# Patient Record
Sex: Male | Born: 1935 | Race: White | Hispanic: No | Smoking: Former smoker
Health system: Southern US, Community
[De-identification: ages and names within clinical notes are randomized; demographics above are authoritative.]

## PROBLEM LIST (undated history)

## (undated) DIAGNOSIS — R0602 Shortness of breath: Secondary | ICD-10-CM

## (undated) DIAGNOSIS — I351 Nonrheumatic aortic (valve) insufficiency: Secondary | ICD-10-CM

## (undated) DIAGNOSIS — R7401 Elevation of levels of liver transaminase levels: Secondary | ICD-10-CM

## (undated) DIAGNOSIS — M4850XA Collapsed vertebra, not elsewhere classified, site unspecified, initial encounter for fracture: Secondary | ICD-10-CM

## (undated) DIAGNOSIS — M7989 Other specified soft tissue disorders: Secondary | ICD-10-CM

## (undated) DIAGNOSIS — I519 Heart disease, unspecified: Secondary | ICD-10-CM

## (undated) DIAGNOSIS — K4090 Unilateral inguinal hernia, without obstruction or gangrene, not specified as recurrent: Secondary | ICD-10-CM

## (undated) DIAGNOSIS — K2990 Gastroduodenitis, unspecified, without bleeding: Secondary | ICD-10-CM

## (undated) DIAGNOSIS — N5089 Other specified disorders of the male genital organs: Secondary | ICD-10-CM

## (undated) DIAGNOSIS — R1013 Epigastric pain: Secondary | ICD-10-CM

## (undated) DIAGNOSIS — R001 Bradycardia, unspecified: Secondary | ICD-10-CM

## (undated) DIAGNOSIS — M609 Myositis, unspecified: Secondary | ICD-10-CM

## (undated) DIAGNOSIS — L0390 Cellulitis, unspecified: Secondary | ICD-10-CM

## (undated) DIAGNOSIS — D589 Hereditary hemolytic anemia, unspecified: Secondary | ICD-10-CM

## (undated) DIAGNOSIS — R509 Fever, unspecified: Secondary | ICD-10-CM

## (undated) DIAGNOSIS — N200 Calculus of kidney: Secondary | ICD-10-CM

## (undated) DIAGNOSIS — I5189 Other ill-defined heart diseases: Secondary | ICD-10-CM

## (undated) DIAGNOSIS — R633 Feeding difficulties, unspecified: Secondary | ICD-10-CM

## (undated) DIAGNOSIS — K2970 Gastritis, unspecified, without bleeding: Secondary | ICD-10-CM

## (undated) DIAGNOSIS — R9430 Abnormal result of cardiovascular function study, unspecified: Secondary | ICD-10-CM

## (undated) DIAGNOSIS — G4700 Insomnia, unspecified: Secondary | ICD-10-CM

## (undated) DIAGNOSIS — R609 Edema, unspecified: Secondary | ICD-10-CM

## (undated) DIAGNOSIS — R7989 Other specified abnormal findings of blood chemistry: Secondary | ICD-10-CM

## (undated) DIAGNOSIS — C9110 Chronic lymphocytic leukemia of B-cell type not having achieved remission: Secondary | ICD-10-CM

## (undated) DIAGNOSIS — E785 Hyperlipidemia, unspecified: Secondary | ICD-10-CM

## (undated) DIAGNOSIS — K9422 Gastrostomy infection: Secondary | ICD-10-CM

## (undated) DIAGNOSIS — IMO0002 Reserved for concepts with insufficient information to code with codable children: Secondary | ICD-10-CM

## (undated) DIAGNOSIS — M3320 Polymyositis, organ involvement unspecified: Principal | ICD-10-CM

## (undated) DIAGNOSIS — R7402 Elevation of levels of lactic acid dehydrogenase (LDH): Secondary | ICD-10-CM

## (undated) DIAGNOSIS — R945 Abnormal results of liver function studies: Secondary | ICD-10-CM

## (undated) DIAGNOSIS — I4510 Unspecified right bundle-branch block: Secondary | ICD-10-CM

## (undated) DIAGNOSIS — I253 Aneurysm of heart: Secondary | ICD-10-CM

## (undated) DIAGNOSIS — R740 Nonspecific elevation of levels of transaminase and lactic acid dehydrogenase [LDH]: Secondary | ICD-10-CM

## (undated) DIAGNOSIS — I340 Nonrheumatic mitral (valve) insufficiency: Secondary | ICD-10-CM

## (undated) HISTORY — DX: Fever, unspecified: R50.9

## (undated) HISTORY — DX: Gastroduodenitis, unspecified, without bleeding: K29.90

## (undated) HISTORY — DX: Unspecified right bundle-branch block: I45.10

## (undated) HISTORY — PX: SPLENECTOMY: SUR1306

## (undated) HISTORY — DX: Abnormal results of liver function studies: R94.5

## (undated) HISTORY — PX: HERNIA REPAIR: SHX51

## (undated) HISTORY — DX: Collapsed vertebra, not elsewhere classified, site unspecified, initial encounter for fracture: M48.50XA

## (undated) HISTORY — PX: ODONTOID FRACTURE SURGERY: SHX724

## (undated) HISTORY — DX: Bradycardia, unspecified: R00.1

## (undated) HISTORY — PX: NASAL SINUS SURGERY: SHX719

## (undated) HISTORY — DX: Gastritis, unspecified, without bleeding: K29.70

## (undated) HISTORY — DX: Nonrheumatic aortic (valve) insufficiency: I35.1

## (undated) HISTORY — DX: Elevation of levels of liver transaminase levels: R74.01

## (undated) HISTORY — DX: Insomnia, unspecified: G47.00

## (undated) HISTORY — DX: Nonspecific elevation of levels of transaminase and lactic acid dehydrogenase (ldh): R74.0

## (undated) HISTORY — DX: Chronic lymphocytic leukemia of B-cell type not having achieved remission: C91.10

## (undated) HISTORY — DX: Abnormal result of cardiovascular function study, unspecified: R94.30

## (undated) HISTORY — DX: Reserved for concepts with insufficient information to code with codable children: IMO0002

## (undated) HISTORY — DX: Heart disease, unspecified: I51.9

## (undated) HISTORY — DX: Feeding difficulties: R63.3

## (undated) HISTORY — DX: Shortness of breath: R06.02

## (undated) HISTORY — DX: Other specified abnormal findings of blood chemistry: R79.89

## (undated) HISTORY — DX: Gastrostomy infection: K94.22

## (undated) HISTORY — DX: Other ill-defined heart diseases: I51.89

## (undated) HISTORY — DX: Epigastric pain: R10.13

## (undated) HISTORY — DX: Hyperlipidemia, unspecified: E78.5

## (undated) HISTORY — DX: Edema, unspecified: R60.9

## (undated) HISTORY — DX: Polymyositis, organ involvement unspecified: M33.20

## (undated) HISTORY — DX: Feeding difficulties, unspecified: R63.30

## (undated) HISTORY — DX: Hereditary hemolytic anemia, unspecified: D58.9

## (undated) HISTORY — PX: CATARACT EXTRACTION: SUR2

## (undated) HISTORY — DX: Calculus of kidney: N20.0

## (undated) HISTORY — PX: KYPHOSIS SURGERY: SHX114

## (undated) HISTORY — DX: Myositis, unspecified: M60.9

## (undated) HISTORY — DX: Cellulitis, unspecified: L03.90

## (undated) HISTORY — DX: Nonrheumatic mitral (valve) insufficiency: I34.0

## (undated) HISTORY — DX: Aneurysm of heart: I25.3

## (undated) HISTORY — DX: Elevation of levels of lactic acid dehydrogenase (LDH): R74.02

## (undated) HISTORY — DX: Other specified disorders of the male genital organs: N50.89

## (undated) HISTORY — DX: Other specified soft tissue disorders: M79.89

## (undated) HISTORY — DX: Unilateral inguinal hernia, without obstruction or gangrene, not specified as recurrent: K40.90

---

## 2004-01-10 ENCOUNTER — Ambulatory Visit: Payer: Self-pay | Admitting: Oncology

## 2004-02-09 ENCOUNTER — Ambulatory Visit: Payer: Self-pay | Admitting: Family Medicine

## 2004-02-11 ENCOUNTER — Ambulatory Visit: Payer: Self-pay | Admitting: Family Medicine

## 2004-02-18 ENCOUNTER — Ambulatory Visit: Payer: Self-pay | Admitting: Oncology

## 2004-02-20 ENCOUNTER — Ambulatory Visit: Payer: Self-pay | Admitting: Unknown Physician Specialty

## 2004-03-11 ENCOUNTER — Ambulatory Visit: Payer: Self-pay | Admitting: Oncology

## 2004-04-11 ENCOUNTER — Ambulatory Visit: Payer: Self-pay | Admitting: Oncology

## 2004-04-11 DIAGNOSIS — C9110 Chronic lymphocytic leukemia of B-cell type not having achieved remission: Secondary | ICD-10-CM

## 2004-04-11 HISTORY — DX: Chronic lymphocytic leukemia of B-cell type not having achieved remission: C91.10

## 2004-05-12 ENCOUNTER — Ambulatory Visit: Payer: Self-pay | Admitting: Oncology

## 2004-07-08 ENCOUNTER — Ambulatory Visit: Payer: Self-pay | Admitting: Oncology

## 2004-07-10 ENCOUNTER — Ambulatory Visit: Payer: Self-pay | Admitting: Oncology

## 2004-09-09 ENCOUNTER — Ambulatory Visit: Payer: Self-pay | Admitting: Oncology

## 2004-10-09 ENCOUNTER — Ambulatory Visit: Payer: Self-pay | Admitting: Oncology

## 2004-10-19 ENCOUNTER — Ambulatory Visit: Payer: Self-pay | Admitting: Unknown Physician Specialty

## 2004-10-26 ENCOUNTER — Ambulatory Visit: Payer: Self-pay | Admitting: Unknown Physician Specialty

## 2004-11-09 ENCOUNTER — Ambulatory Visit: Payer: Self-pay | Admitting: Oncology

## 2004-12-10 ENCOUNTER — Ambulatory Visit: Payer: Self-pay | Admitting: Oncology

## 2005-01-07 ENCOUNTER — Ambulatory Visit: Payer: Self-pay | Admitting: Family Medicine

## 2005-02-02 ENCOUNTER — Ambulatory Visit: Payer: Self-pay | Admitting: Family Medicine

## 2005-02-23 ENCOUNTER — Ambulatory Visit: Payer: Self-pay | Admitting: Oncology

## 2005-03-11 ENCOUNTER — Ambulatory Visit: Payer: Self-pay | Admitting: Oncology

## 2005-05-26 ENCOUNTER — Ambulatory Visit: Payer: Self-pay | Admitting: Oncology

## 2005-06-09 ENCOUNTER — Ambulatory Visit: Payer: Self-pay | Admitting: Oncology

## 2005-07-07 ENCOUNTER — Ambulatory Visit: Payer: Self-pay | Admitting: Unknown Physician Specialty

## 2005-07-10 ENCOUNTER — Ambulatory Visit: Payer: Self-pay | Admitting: Oncology

## 2005-07-26 ENCOUNTER — Ambulatory Visit: Payer: Self-pay | Admitting: Ophthalmology

## 2005-08-01 ENCOUNTER — Ambulatory Visit: Payer: Self-pay | Admitting: Unknown Physician Specialty

## 2005-08-09 ENCOUNTER — Ambulatory Visit: Payer: Self-pay | Admitting: Oncology

## 2005-08-18 ENCOUNTER — Ambulatory Visit: Payer: Self-pay | Admitting: Vascular Surgery

## 2005-08-30 ENCOUNTER — Ambulatory Visit: Payer: Self-pay | Admitting: Vascular Surgery

## 2005-09-06 ENCOUNTER — Ambulatory Visit: Payer: Self-pay | Admitting: Ophthalmology

## 2005-09-09 ENCOUNTER — Ambulatory Visit: Payer: Self-pay | Admitting: Oncology

## 2005-10-09 ENCOUNTER — Ambulatory Visit: Payer: Self-pay | Admitting: Oncology

## 2005-11-09 ENCOUNTER — Ambulatory Visit: Payer: Self-pay | Admitting: Oncology

## 2005-12-10 ENCOUNTER — Ambulatory Visit: Payer: Self-pay | Admitting: Oncology

## 2006-01-09 ENCOUNTER — Ambulatory Visit: Payer: Self-pay | Admitting: Oncology

## 2006-02-09 ENCOUNTER — Ambulatory Visit: Payer: Self-pay | Admitting: Oncology

## 2006-03-11 ENCOUNTER — Ambulatory Visit: Payer: Self-pay | Admitting: Oncology

## 2006-04-13 ENCOUNTER — Ambulatory Visit: Payer: Self-pay | Admitting: Oncology

## 2006-05-12 ENCOUNTER — Ambulatory Visit: Payer: Self-pay | Admitting: Oncology

## 2006-07-10 ENCOUNTER — Ambulatory Visit: Payer: Self-pay | Admitting: Oncology

## 2006-07-11 ENCOUNTER — Ambulatory Visit: Payer: Self-pay | Admitting: Oncology

## 2006-09-10 ENCOUNTER — Ambulatory Visit: Payer: Self-pay | Admitting: Oncology

## 2006-09-24 ENCOUNTER — Emergency Department: Payer: Self-pay

## 2006-09-24 ENCOUNTER — Other Ambulatory Visit: Payer: Self-pay

## 2006-10-09 ENCOUNTER — Ambulatory Visit: Payer: Self-pay | Admitting: Oncology

## 2006-10-10 ENCOUNTER — Ambulatory Visit: Payer: Self-pay | Admitting: Oncology

## 2006-12-11 ENCOUNTER — Ambulatory Visit: Payer: Self-pay | Admitting: Oncology

## 2007-01-08 ENCOUNTER — Ambulatory Visit: Payer: Self-pay | Admitting: Oncology

## 2007-01-10 ENCOUNTER — Ambulatory Visit: Payer: Self-pay | Admitting: Oncology

## 2007-04-02 ENCOUNTER — Ambulatory Visit: Payer: Self-pay | Admitting: Oncology

## 2007-04-12 ENCOUNTER — Ambulatory Visit: Payer: Self-pay | Admitting: Oncology

## 2007-04-12 DIAGNOSIS — I253 Aneurysm of heart: Secondary | ICD-10-CM

## 2007-04-12 DIAGNOSIS — I351 Nonrheumatic aortic (valve) insufficiency: Secondary | ICD-10-CM

## 2007-04-12 HISTORY — DX: Aneurysm of heart: I25.3

## 2007-04-12 HISTORY — DX: Nonrheumatic aortic (valve) insufficiency: I35.1

## 2007-04-20 ENCOUNTER — Other Ambulatory Visit: Payer: Self-pay

## 2007-04-20 ENCOUNTER — Ambulatory Visit: Payer: Self-pay | Admitting: Family Medicine

## 2007-05-04 ENCOUNTER — Ambulatory Visit: Payer: Self-pay | Admitting: Family Medicine

## 2007-05-10 ENCOUNTER — Inpatient Hospital Stay (HOSPITAL_COMMUNITY): Admission: EM | Admit: 2007-05-10 | Discharge: 2007-05-16 | Payer: Self-pay | Admitting: Emergency Medicine

## 2007-05-10 ENCOUNTER — Ambulatory Visit: Payer: Self-pay | Admitting: Pulmonary Disease

## 2007-05-10 ENCOUNTER — Ambulatory Visit: Payer: Self-pay | Admitting: Cardiovascular Disease

## 2007-05-10 ENCOUNTER — Emergency Department: Payer: Self-pay | Admitting: Emergency Medicine

## 2007-05-11 ENCOUNTER — Ambulatory Visit: Payer: Self-pay | Admitting: Cardiology

## 2007-05-11 ENCOUNTER — Encounter (INDEPENDENT_AMBULATORY_CARE_PROVIDER_SITE_OTHER): Payer: Self-pay | Admitting: Neurological Surgery

## 2007-05-22 ENCOUNTER — Ambulatory Visit: Payer: Self-pay | Admitting: Pulmonary Disease

## 2007-05-22 ENCOUNTER — Ambulatory Visit: Payer: Self-pay | Admitting: Cardiology

## 2007-05-22 DIAGNOSIS — R0602 Shortness of breath: Secondary | ICD-10-CM | POA: Insufficient documentation

## 2007-05-25 ENCOUNTER — Ambulatory Visit: Payer: Self-pay | Admitting: Internal Medicine

## 2007-05-26 ENCOUNTER — Ambulatory Visit (HOSPITAL_COMMUNITY): Admission: RE | Admit: 2007-05-26 | Discharge: 2007-05-26 | Payer: Self-pay | Admitting: Otolaryngology

## 2007-05-29 ENCOUNTER — Ambulatory Visit: Payer: Self-pay | Admitting: Cardiology

## 2007-06-01 ENCOUNTER — Telehealth: Payer: Self-pay | Admitting: Pulmonary Disease

## 2007-06-08 ENCOUNTER — Inpatient Hospital Stay (HOSPITAL_COMMUNITY): Admission: EM | Admit: 2007-06-08 | Discharge: 2007-06-24 | Payer: Self-pay | Admitting: Emergency Medicine

## 2007-06-08 ENCOUNTER — Ambulatory Visit: Payer: Self-pay | Admitting: Internal Medicine

## 2007-06-09 ENCOUNTER — Encounter: Payer: Self-pay | Admitting: Internal Medicine

## 2007-06-09 ENCOUNTER — Ambulatory Visit: Payer: Self-pay | Admitting: Surgery

## 2007-06-10 HISTORY — PX: PEG PLACEMENT: SHX5437

## 2007-06-15 ENCOUNTER — Encounter (INDEPENDENT_AMBULATORY_CARE_PROVIDER_SITE_OTHER): Payer: Self-pay | Admitting: General Surgery

## 2007-06-21 ENCOUNTER — Encounter: Payer: Self-pay | Admitting: Gastroenterology

## 2007-06-22 ENCOUNTER — Encounter: Payer: Self-pay | Admitting: Pulmonary Disease

## 2007-06-26 ENCOUNTER — Ambulatory Visit: Payer: Self-pay | Admitting: Gastroenterology

## 2007-07-02 ENCOUNTER — Ambulatory Visit: Payer: Self-pay | Admitting: Oncology

## 2007-07-04 ENCOUNTER — Ambulatory Visit: Payer: Self-pay | Admitting: Cardiology

## 2007-07-04 ENCOUNTER — Ambulatory Visit: Payer: Self-pay | Admitting: Gastroenterology

## 2007-07-04 LAB — CONVERTED CEMR LAB
ALT: 35 units/L (ref 0–53)
AST: 76 units/L — ABNORMAL HIGH (ref 0–37)
Alkaline Phosphatase: 72 units/L (ref 39–117)
Bilirubin, Direct: 0.3 mg/dL (ref 0.0–0.3)
CO2: 34 meq/L — ABNORMAL HIGH (ref 19–32)
Chloride: 94 meq/L — ABNORMAL LOW (ref 96–112)
Glucose, Bld: 112 mg/dL — ABNORMAL HIGH (ref 70–99)
HCT: 36.3 % — ABNORMAL LOW (ref 39.0–52.0)
MCV: 103 fL — ABNORMAL HIGH (ref 78.0–100.0)
Platelets: 269 10*3/uL (ref 150–400)
Potassium: 4.6 meq/L (ref 3.5–5.1)
RDW: 14.1 % (ref 11.5–14.6)
Sodium: 134 meq/L — ABNORMAL LOW (ref 135–145)
Total Protein: 6 g/dL (ref 6.0–8.3)

## 2007-07-11 ENCOUNTER — Ambulatory Visit: Payer: Self-pay | Admitting: Oncology

## 2007-07-19 ENCOUNTER — Ambulatory Visit: Payer: Self-pay | Admitting: Gastroenterology

## 2007-07-31 ENCOUNTER — Ambulatory Visit: Payer: Self-pay | Admitting: Cardiology

## 2007-08-08 ENCOUNTER — Ambulatory Visit: Payer: Self-pay | Admitting: Cardiology

## 2007-08-08 LAB — CONVERTED CEMR LAB
CO2: 30 meq/L (ref 19–32)
Calcium: 9.2 mg/dL (ref 8.4–10.5)
Creatinine, Ser: 0.6 mg/dL (ref 0.4–1.5)
Glucose, Bld: 160 mg/dL — ABNORMAL HIGH (ref 70–99)
Sodium: 134 meq/L — ABNORMAL LOW (ref 135–145)

## 2007-08-22 ENCOUNTER — Ambulatory Visit (HOSPITAL_COMMUNITY): Admission: RE | Admit: 2007-08-22 | Discharge: 2007-08-22 | Payer: Self-pay | Admitting: Neurology

## 2007-08-30 ENCOUNTER — Encounter: Admission: RE | Admit: 2007-08-30 | Discharge: 2007-09-13 | Payer: Self-pay | Admitting: Neurology

## 2007-09-10 ENCOUNTER — Ambulatory Visit: Payer: Self-pay | Admitting: Oncology

## 2007-10-05 ENCOUNTER — Ambulatory Visit: Payer: Self-pay | Admitting: Oncology

## 2007-10-09 ENCOUNTER — Ambulatory Visit: Payer: Self-pay | Admitting: Cardiology

## 2007-10-09 LAB — CONVERTED CEMR LAB
BUN: 23 mg/dL (ref 6–23)
GFR calc Af Amer: 141 mL/min
Glucose, Bld: 93 mg/dL (ref 70–99)
Potassium: 4.6 meq/L (ref 3.5–5.1)

## 2007-10-10 ENCOUNTER — Ambulatory Visit: Payer: Self-pay | Admitting: Oncology

## 2007-10-18 ENCOUNTER — Ambulatory Visit (HOSPITAL_COMMUNITY): Admission: RE | Admit: 2007-10-18 | Discharge: 2007-10-18 | Payer: Self-pay | Admitting: Neurology

## 2007-10-22 ENCOUNTER — Ambulatory Visit (HOSPITAL_COMMUNITY): Admission: RE | Admit: 2007-10-22 | Discharge: 2007-10-22 | Payer: Self-pay | Admitting: Chiropractic Medicine

## 2007-10-23 ENCOUNTER — Encounter: Payer: Self-pay | Admitting: Gastroenterology

## 2007-10-23 ENCOUNTER — Telehealth: Payer: Self-pay | Admitting: Gastroenterology

## 2007-10-25 ENCOUNTER — Encounter: Admission: RE | Admit: 2007-10-25 | Discharge: 2007-10-25 | Payer: Self-pay | Admitting: Neurological Surgery

## 2007-10-30 ENCOUNTER — Ambulatory Visit (HOSPITAL_COMMUNITY): Admission: RE | Admit: 2007-10-30 | Discharge: 2007-10-30 | Payer: Self-pay | Admitting: Neurological Surgery

## 2007-10-31 ENCOUNTER — Encounter (INDEPENDENT_AMBULATORY_CARE_PROVIDER_SITE_OTHER): Payer: Self-pay | Admitting: Neurological Surgery

## 2007-11-10 ENCOUNTER — Ambulatory Visit: Payer: Self-pay | Admitting: Oncology

## 2007-11-11 ENCOUNTER — Encounter: Admission: RE | Admit: 2007-11-11 | Discharge: 2007-11-11 | Payer: Self-pay | Admitting: Neurological Surgery

## 2007-11-12 ENCOUNTER — Encounter: Payer: Self-pay | Admitting: Gastroenterology

## 2007-11-16 ENCOUNTER — Inpatient Hospital Stay (HOSPITAL_COMMUNITY): Admission: RE | Admit: 2007-11-16 | Discharge: 2007-11-16 | Payer: Self-pay | Admitting: Neurosurgery

## 2007-11-17 ENCOUNTER — Emergency Department (HOSPITAL_COMMUNITY): Admission: EM | Admit: 2007-11-17 | Discharge: 2007-11-17 | Payer: Self-pay | Admitting: Emergency Medicine

## 2007-11-20 ENCOUNTER — Emergency Department (HOSPITAL_COMMUNITY): Admission: EM | Admit: 2007-11-20 | Discharge: 2007-11-20 | Payer: Self-pay | Admitting: Emergency Medicine

## 2007-12-04 ENCOUNTER — Encounter: Payer: Self-pay | Admitting: Gastroenterology

## 2007-12-07 ENCOUNTER — Encounter: Admission: RE | Admit: 2007-12-07 | Discharge: 2007-12-07 | Payer: Self-pay | Admitting: Neurological Surgery

## 2007-12-11 ENCOUNTER — Ambulatory Visit: Payer: Self-pay | Admitting: Oncology

## 2007-12-11 ENCOUNTER — Telehealth: Payer: Self-pay | Admitting: Gastroenterology

## 2007-12-11 HISTORY — PX: PEG TUBE REMOVAL: SHX2187

## 2007-12-12 ENCOUNTER — Encounter: Payer: Self-pay | Admitting: Gastroenterology

## 2007-12-13 ENCOUNTER — Observation Stay (HOSPITAL_COMMUNITY): Admission: RE | Admit: 2007-12-13 | Discharge: 2007-12-13 | Payer: Self-pay | Admitting: Neurological Surgery

## 2007-12-14 ENCOUNTER — Telehealth: Payer: Self-pay | Admitting: Gastroenterology

## 2007-12-24 ENCOUNTER — Ambulatory Visit: Payer: Self-pay | Admitting: Gastroenterology

## 2007-12-24 DIAGNOSIS — R633 Feeding difficulties: Secondary | ICD-10-CM

## 2007-12-24 DIAGNOSIS — R740 Nonspecific elevation of levels of transaminase and lactic acid dehydrogenase [LDH]: Secondary | ICD-10-CM

## 2008-01-03 ENCOUNTER — Ambulatory Visit (HOSPITAL_COMMUNITY): Admission: RE | Admit: 2008-01-03 | Discharge: 2008-01-04 | Payer: Self-pay | Admitting: Neurological Surgery

## 2008-01-03 ENCOUNTER — Encounter (INDEPENDENT_AMBULATORY_CARE_PROVIDER_SITE_OTHER): Payer: Self-pay | Admitting: Neurological Surgery

## 2008-01-10 ENCOUNTER — Ambulatory Visit: Payer: Self-pay | Admitting: Oncology

## 2008-01-13 ENCOUNTER — Encounter: Payer: Self-pay | Admitting: Cardiology

## 2008-01-14 ENCOUNTER — Ambulatory Visit: Payer: Self-pay | Admitting: Cardiology

## 2008-01-19 ENCOUNTER — Encounter: Admission: RE | Admit: 2008-01-19 | Discharge: 2008-01-19 | Payer: Self-pay | Admitting: Neurological Surgery

## 2008-01-28 ENCOUNTER — Ambulatory Visit (HOSPITAL_COMMUNITY): Admission: RE | Admit: 2008-01-28 | Discharge: 2008-01-29 | Payer: Self-pay | Admitting: Neurological Surgery

## 2008-01-28 ENCOUNTER — Encounter (INDEPENDENT_AMBULATORY_CARE_PROVIDER_SITE_OTHER): Payer: Self-pay | Admitting: Neurological Surgery

## 2008-01-31 ENCOUNTER — Encounter: Payer: Self-pay | Admitting: Cardiology

## 2008-01-31 ENCOUNTER — Ambulatory Visit: Payer: Self-pay | Admitting: Cardiology

## 2008-01-31 ENCOUNTER — Ambulatory Visit: Payer: Self-pay

## 2008-02-10 ENCOUNTER — Ambulatory Visit: Payer: Self-pay | Admitting: Oncology

## 2008-03-11 ENCOUNTER — Ambulatory Visit: Payer: Self-pay | Admitting: Oncology

## 2008-03-31 ENCOUNTER — Ambulatory Visit: Payer: Self-pay | Admitting: Family Medicine

## 2008-04-11 ENCOUNTER — Ambulatory Visit: Payer: Self-pay | Admitting: Oncology

## 2008-04-14 ENCOUNTER — Encounter: Payer: Self-pay | Admitting: Gastroenterology

## 2008-04-21 ENCOUNTER — Encounter: Admission: RE | Admit: 2008-04-21 | Discharge: 2008-04-21 | Payer: Self-pay | Admitting: Neurology

## 2008-04-24 ENCOUNTER — Ambulatory Visit: Payer: Self-pay | Admitting: Family Medicine

## 2008-05-19 ENCOUNTER — Telehealth: Payer: Self-pay | Admitting: Gastroenterology

## 2008-05-28 ENCOUNTER — Telehealth (INDEPENDENT_AMBULATORY_CARE_PROVIDER_SITE_OTHER): Payer: Self-pay

## 2008-05-29 ENCOUNTER — Ambulatory Visit: Payer: Self-pay | Admitting: Gastroenterology

## 2008-05-29 DIAGNOSIS — R1013 Epigastric pain: Secondary | ICD-10-CM | POA: Insufficient documentation

## 2008-05-29 LAB — CONVERTED CEMR LAB
H Pylori IgG: NEGATIVE
Lipase: 31 units/L (ref 11.0–59.0)

## 2008-06-03 ENCOUNTER — Ambulatory Visit: Payer: Self-pay | Admitting: Gastroenterology

## 2008-06-03 ENCOUNTER — Encounter: Payer: Self-pay | Admitting: Gastroenterology

## 2008-06-05 ENCOUNTER — Encounter: Payer: Self-pay | Admitting: Gastroenterology

## 2008-06-09 ENCOUNTER — Ambulatory Visit: Payer: Self-pay | Admitting: Oncology

## 2008-06-30 ENCOUNTER — Ambulatory Visit: Payer: Self-pay | Admitting: Gastroenterology

## 2008-06-30 DIAGNOSIS — K2990 Gastroduodenitis, unspecified, without bleeding: Secondary | ICD-10-CM

## 2008-06-30 DIAGNOSIS — K4090 Unilateral inguinal hernia, without obstruction or gangrene, not specified as recurrent: Secondary | ICD-10-CM | POA: Insufficient documentation

## 2008-06-30 DIAGNOSIS — K2970 Gastritis, unspecified, without bleeding: Secondary | ICD-10-CM | POA: Insufficient documentation

## 2008-06-30 LAB — CONVERTED CEMR LAB
BUN: 13 mg/dL (ref 6–23)
Creatinine, Ser: 0.7 mg/dL (ref 0.4–1.5)

## 2008-07-03 ENCOUNTER — Telehealth: Payer: Self-pay | Admitting: Gastroenterology

## 2008-07-04 ENCOUNTER — Ambulatory Visit: Payer: Self-pay | Admitting: Cardiology

## 2008-07-08 ENCOUNTER — Ambulatory Visit: Payer: Self-pay | Admitting: Oncology

## 2008-07-10 ENCOUNTER — Ambulatory Visit: Payer: Self-pay | Admitting: Oncology

## 2008-07-16 ENCOUNTER — Encounter: Admission: RE | Admit: 2008-07-16 | Discharge: 2008-07-16 | Payer: Self-pay | Admitting: Neurology

## 2008-07-18 ENCOUNTER — Ambulatory Visit (HOSPITAL_COMMUNITY): Admission: RE | Admit: 2008-07-18 | Discharge: 2008-07-19 | Payer: Self-pay | Admitting: Surgery

## 2008-07-22 DIAGNOSIS — N200 Calculus of kidney: Secondary | ICD-10-CM

## 2008-07-22 DIAGNOSIS — E785 Hyperlipidemia, unspecified: Secondary | ICD-10-CM

## 2008-07-22 DIAGNOSIS — M810 Age-related osteoporosis without current pathological fracture: Secondary | ICD-10-CM

## 2008-07-22 DIAGNOSIS — IMO0001 Reserved for inherently not codable concepts without codable children: Secondary | ICD-10-CM | POA: Insufficient documentation

## 2008-07-22 DIAGNOSIS — D649 Anemia, unspecified: Secondary | ICD-10-CM | POA: Insufficient documentation

## 2008-07-22 DIAGNOSIS — N5089 Other specified disorders of the male genital organs: Secondary | ICD-10-CM | POA: Insufficient documentation

## 2008-08-01 ENCOUNTER — Encounter: Payer: Self-pay | Admitting: Cardiology

## 2008-08-01 ENCOUNTER — Ambulatory Visit: Payer: Self-pay | Admitting: Cardiology

## 2008-08-01 DIAGNOSIS — C9110 Chronic lymphocytic leukemia of B-cell type not having achieved remission: Secondary | ICD-10-CM | POA: Insufficient documentation

## 2008-08-12 ENCOUNTER — Ambulatory Visit: Payer: Self-pay | Admitting: Oncology

## 2008-09-09 ENCOUNTER — Ambulatory Visit: Payer: Self-pay | Admitting: Oncology

## 2008-09-15 ENCOUNTER — Encounter: Payer: Self-pay | Admitting: Gastroenterology

## 2008-10-09 ENCOUNTER — Ambulatory Visit: Payer: Self-pay | Admitting: Oncology

## 2008-11-09 ENCOUNTER — Ambulatory Visit: Payer: Self-pay | Admitting: Oncology

## 2008-12-10 ENCOUNTER — Ambulatory Visit: Payer: Self-pay | Admitting: Oncology

## 2008-12-23 ENCOUNTER — Encounter: Payer: Self-pay | Admitting: Cardiology

## 2008-12-25 ENCOUNTER — Ambulatory Visit: Payer: Self-pay | Admitting: Unknown Physician Specialty

## 2009-01-09 ENCOUNTER — Ambulatory Visit: Payer: Self-pay | Admitting: Oncology

## 2009-01-15 ENCOUNTER — Encounter: Payer: Self-pay | Admitting: Cardiology

## 2009-01-18 ENCOUNTER — Encounter: Payer: Self-pay | Admitting: Cardiology

## 2009-01-19 ENCOUNTER — Ambulatory Visit: Payer: Self-pay | Admitting: Cardiology

## 2009-01-19 DIAGNOSIS — E876 Hypokalemia: Secondary | ICD-10-CM | POA: Insufficient documentation

## 2009-02-09 ENCOUNTER — Ambulatory Visit: Payer: Self-pay | Admitting: Oncology

## 2009-03-11 ENCOUNTER — Ambulatory Visit: Payer: Self-pay | Admitting: Oncology

## 2009-04-11 ENCOUNTER — Ambulatory Visit: Payer: Self-pay | Admitting: Oncology

## 2009-05-12 ENCOUNTER — Ambulatory Visit: Payer: Self-pay | Admitting: Oncology

## 2009-06-09 ENCOUNTER — Ambulatory Visit: Payer: Self-pay | Admitting: Oncology

## 2009-07-10 ENCOUNTER — Ambulatory Visit: Payer: Self-pay | Admitting: Oncology

## 2009-07-22 ENCOUNTER — Ambulatory Visit: Payer: Self-pay | Admitting: Cardiology

## 2009-08-09 ENCOUNTER — Ambulatory Visit: Payer: Self-pay | Admitting: Oncology

## 2009-09-09 ENCOUNTER — Ambulatory Visit: Payer: Self-pay | Admitting: Oncology

## 2009-10-20 ENCOUNTER — Ambulatory Visit: Payer: Self-pay | Admitting: Oncology

## 2009-11-09 ENCOUNTER — Ambulatory Visit: Payer: Self-pay | Admitting: Oncology

## 2009-11-22 IMAGING — CT CT CHEST W/ CM
1 of 2 series · 14 of 32 positions shown, 18 images · non-contrast
Comparison: none

REASON FOR EXAM: Dyspnea Fatigue x 2 wks Eval for PE Call Report [id number]
COMMENTS:

[Series 5: lung windows · axial · 0.77mm/px · z∈[-238,+46]mm · 14 of 113 slices shown, 18 images]
[im 9/113  mediastinal]
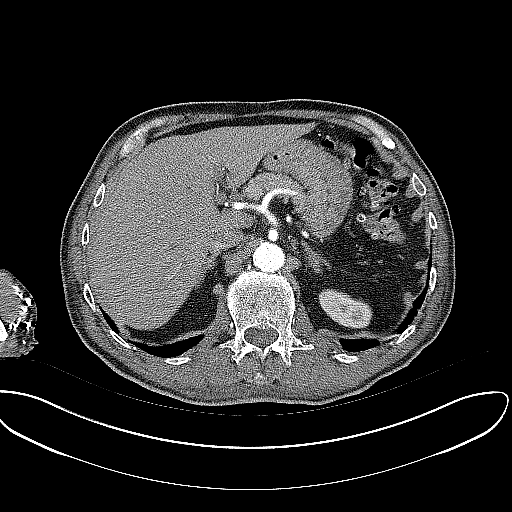
[im 9/113  lung]
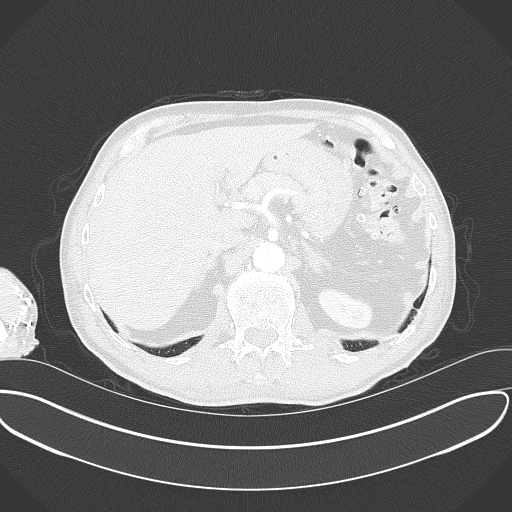
[im 18/113  lung]
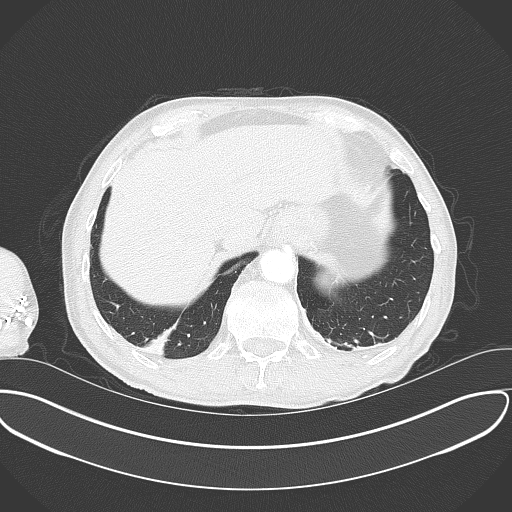
[im 26/113  lung]
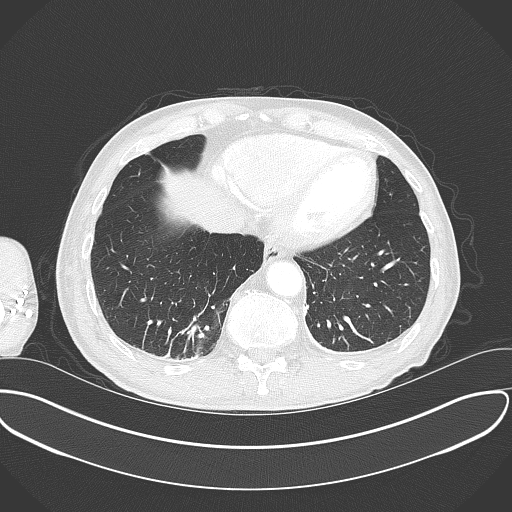
[im 35/113  lung]
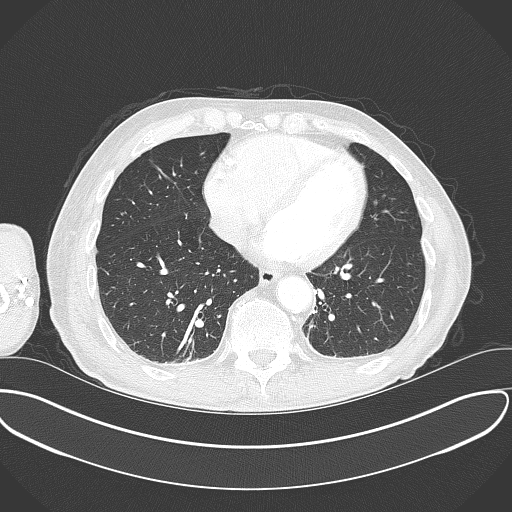
[im 44/113  mediastinal]
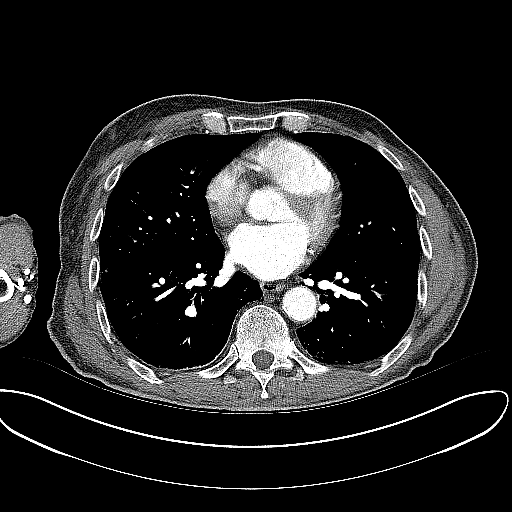
[im 44/113  lung]
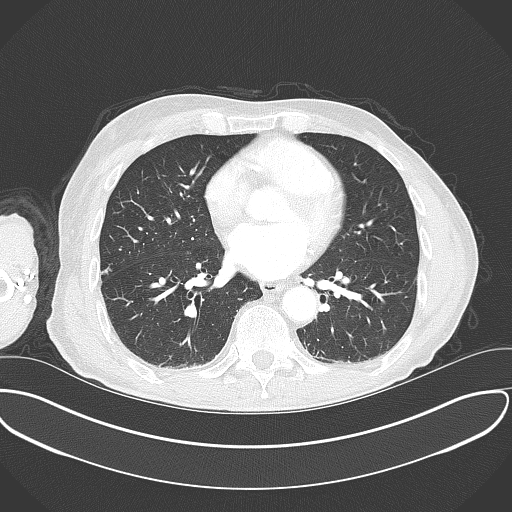
[im 52/113  lung]
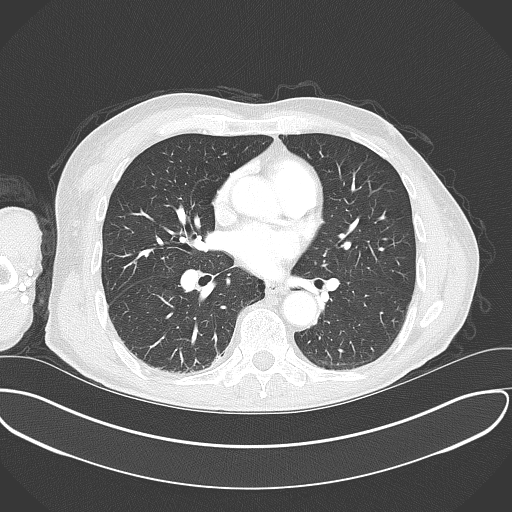
[im 53/113  lung]
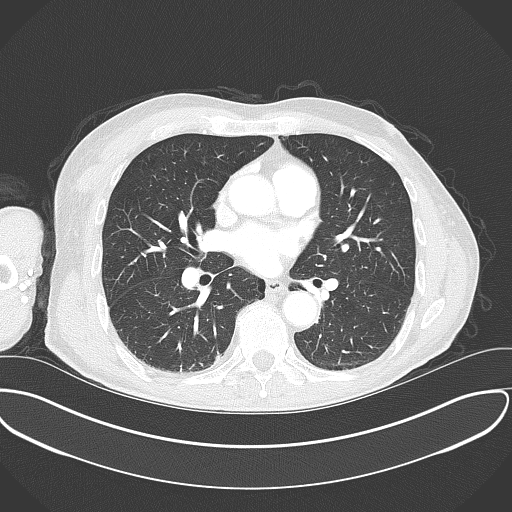
[im 57/113  lung]
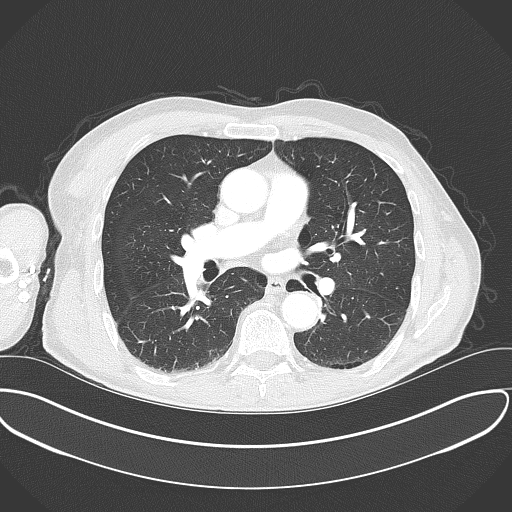
[im 61/113  mediastinal]
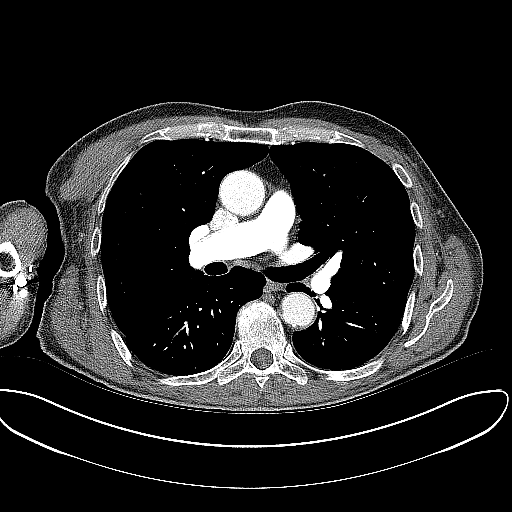
[im 61/113  lung]
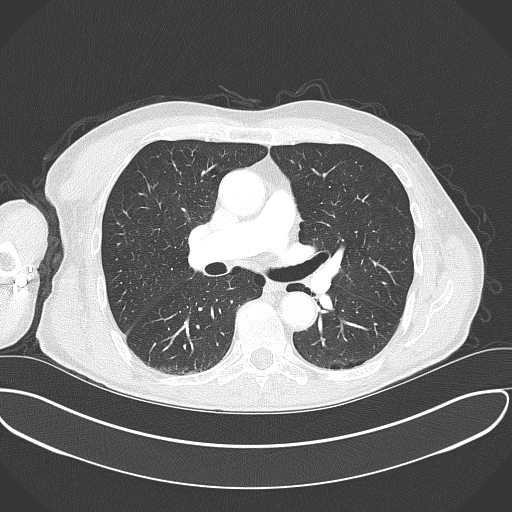
[im 69/113  lung]
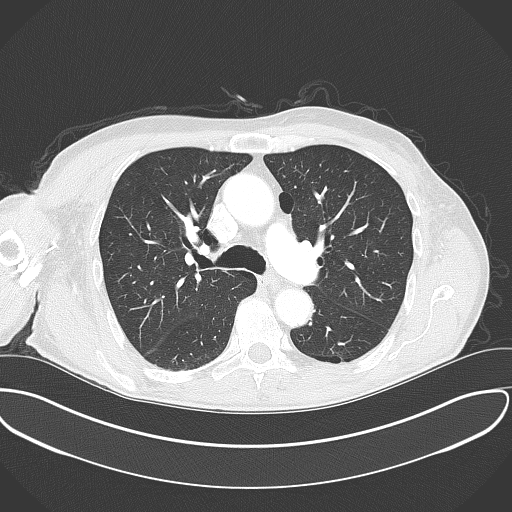
[im 78/113  lung]
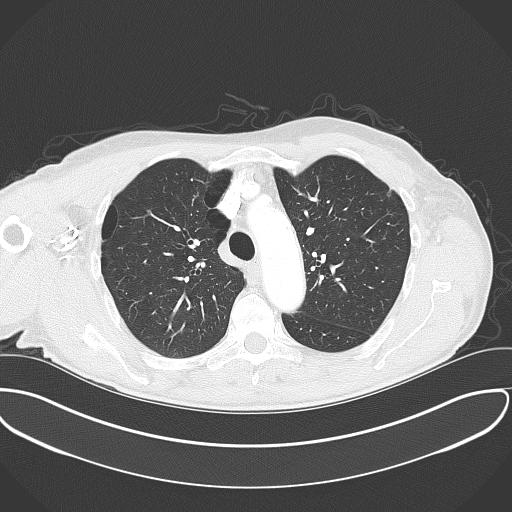
[im 87/113  lung]
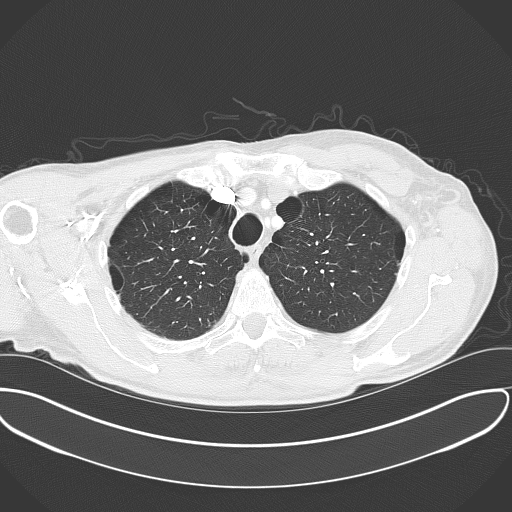
[im 95/113  mediastinal]
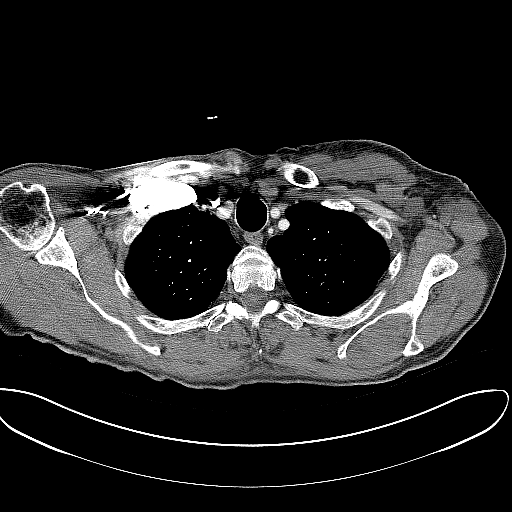
[im 95/113  lung]
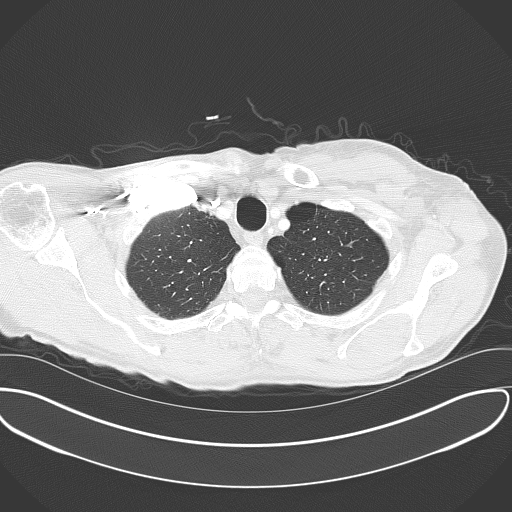
[im 104/113  lung]
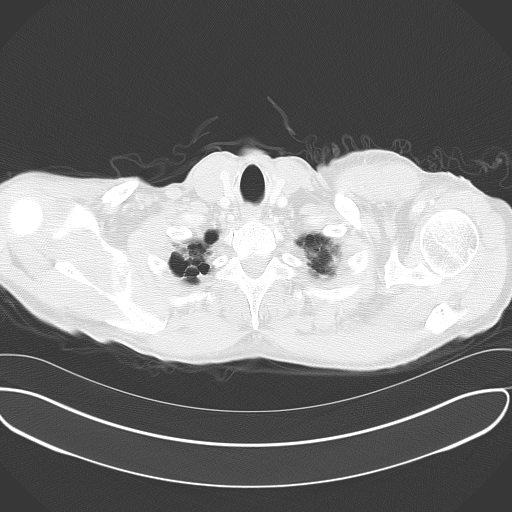

[14 of 32 positions shown; findings below may reference images not displayed]

PROCEDURE:     CT  - CT CHEST WITH CONTRAST  - May 04, 2007  [DATE]

RESULT:     Helical 3 mm sections were obtained from the thoracic inlet
through the lung bases status post intravenous administration of 75 ml of
[id number].

Evaluation of the mediastinum and hilar regions and structures demonstrates
no evidence of mediastinal or hilar adenopathy.

A filling defect is identified within a subsegmental pulmonary artery of the
RIGHT lower lobe. There is also a wedge shaped area of increased density
distally. No further filling defects are identified within the visualized
portion of the pulmonary arterial system. Evaluation of the remaining lung
parenchyma demonstrates emphysematous/bullous changes within the lung apices
without further regions of consolidation of focal infiltrates. As stated
above a wedge shaped density is appreciated within the base of the RIGHT
lower lobe. The visualized upper abdominal viscera demonstrates a small cyst
within the LEFT kidney peripherally, otherwise, unremarkable.
IMPRESSION: 1.Findings consistent with subsegmental pulmonary embolic disease within the
RIGHT lower lobe.

2.Emphysematous changes.

Dr. Moneybagg Seymour of the Internal Medicine service was informed of these
findings at the time of the initial interpretation.

## 2009-12-10 ENCOUNTER — Ambulatory Visit: Payer: Self-pay | Admitting: Oncology

## 2009-12-15 ENCOUNTER — Encounter: Payer: Self-pay | Admitting: Cardiology

## 2010-01-09 ENCOUNTER — Ambulatory Visit: Payer: Self-pay | Admitting: Oncology

## 2010-01-13 ENCOUNTER — Ambulatory Visit: Payer: Self-pay | Admitting: Cardiology

## 2010-02-09 ENCOUNTER — Ambulatory Visit: Payer: Self-pay | Admitting: Oncology

## 2010-03-11 ENCOUNTER — Ambulatory Visit: Payer: Self-pay | Admitting: Internal Medicine

## 2010-03-11 ENCOUNTER — Ambulatory Visit: Payer: Self-pay | Admitting: Oncology

## 2010-04-11 ENCOUNTER — Ambulatory Visit: Payer: Self-pay | Admitting: Oncology

## 2010-05-02 ENCOUNTER — Encounter: Payer: Self-pay | Admitting: Neurology

## 2010-05-02 ENCOUNTER — Encounter: Payer: Self-pay | Admitting: Otolaryngology

## 2010-05-11 NOTE — Assessment & Plan Note (Signed)
Summary: f13m  Medications Added KLOR-CON M20 20 MEQ CR-TABS (POTASSIUM CHLORIDE CRYS CR) Take 1 tablets by mouth once daily/ Take an extra tab twice a week. PREDNISONE 10 MG TABS (PREDNISONE) ( 9mg  daily)      Allergies Added:   Visit Type:  Follow-up Referring Provider:  Lesia Sago, MD Primary Provider:  Elizabeth Sauer, MD  CC:  fluid overload.  History of Present Illness: The patient is seen back in followup fluid overload.  He has normal systolic function.  He was quite ill in the past and ultimately a diagnosis of myositis was made.  He is followed very carefully by Dr. Anne Hahn.  He is on low-dose prednisone and all attempts are made to very carefully wean this.  He is on 9 mg daily at this time.  He does use a small dose of Lasix to control his volume status related to some mild diastolic dysfunction and prednisone use.  He has not had any chest pain or significant shortness of breath.  Current Medications (verified): 1)  Temazepam 30 Mg  Caps (Temazepam) .Marland Kitchen.. 1 By Mouth At Bedtime 2)  Lexapro 10 Mg  Tabs (Escitalopram Oxalate) .Marland Kitchen.. 1 By Mouth Daily 3)  Vitamin D3 1000 Unit  Tabs (Cholecalciferol) .Marland Kitchen.. 1 By Mouth Daily 4)  Klor-Con M20 20 Meq Cr-Tabs (Potassium Chloride Crys Cr) .... Take 1 Tablets By Mouth Once Daily/ Take An Extra Tab Twice A Week. 5)  Hydrocodone-Acetaminophen 5-500 Mg  Tabs (Hydrocodone-Acetaminophen) .Marland Kitchen.. 1 By Mouth Every 4-6 Hours As Needed 6)  Prednisone 10 Mg Tabs (Prednisone) .... ( 9mg  Daily) 7)  Nexium 40 Mg Cpdr (Esomeprazole Magnesium) .... Take 1 Tablet By Mouth Two Times A Day 8)  Furosemide 80 Mg Tabs (Furosemide) .... Take 1 Tablet By Mouth Once A Day 9)  Iron 325 (65 Fe) Mg Tabs (Ferrous Sulfate) .... Take 1 Tablet By Mouth Once Per Day. 10)  Zometa 4 Mg/62ml Conc (Zoledronic Acid) .... Iv Q14months 11)  Cellcept 500 Mg Tabs (Mycophenolate Mofetil) .... Take 1 Tablet By Mouth Two Times A Day 12)  Sucralfate 1 Gm Tabs (Sucralfate) .... Take 1 Tablet  By Mouth Once Per Day. 13)  Vitamin B-12 1000 Mcg Tabs (Cyanocobalamin) .... Take 1 Tablet By Mouth Once A Day 14)  Fish Oil 1000 Mg Caps (Omega-3 Fatty Acids) .... Take 1 Tablet By Mouth Two Times A Day 15)  Multivitamins  Tabs (Multiple Vitamin) .... Take 1 Tablet By Mouth Once A Day 16)  Calcium 500/d 500-200 Mg-Unit Tabs (Calcium Carbonate-Vitamin D) .... Take 1 Tablet By Mouth Two Times A Day 17)  Resource Beneprotein  Pack (Protein) .... 3g Three Times Per Day. 18)  Cyanocobalamin 1000 Mcg/ml Soln (Cyanocobalamin) .... Monthly  Allergies (verified): 1)  ! Sulfa  Past History:  Past Medical History: Last updated: 01/19/2009 CLL s/p treatment with Rituxan 2006 Atrial septal aneurysm.. insignificant problem ...echo... January, 2009 PEG cellulitis Vertebral compression fractures-multiple levels Herpes zoster, right chest AORTIC INSUFFICIENCY, MILD (ICD-424.1) EF 60-65%... echo... January, 2009 / 55%.Marland Kitchen echo October, 2009.Marland Kitchenpossible inferior and posterior hypokinesis by the reading.  I have reviewed this echo and the findings are very borderline. Diastolic dysfunction.... mild CHEST PAIN (ICD-786.50) EDEMA (ICD-782.3) DYSPNEA (ICD-786.05). No primary pulmonary disease, but pulmonary difficulties related to muscle weakness or myositis. Also, some diastolic dysfunction ANEMIA (ICD-285.9) MYOSITIS (ICD-729.1)..followed by Dr. Anne Hahn.Marland KitchenImuran and prednisone therapy NEPHROLITHIASIS (ICD-592.0) INGUINAL HERNIA, RIGHT (ICD-550.90) GASTRITIS (ICD-535.50) ABDOMINAL PAIN, EPIGASTRIC (ICD-789.06) FAMILY HX COLON CANCER (ICD-V16.0) ABNORMAL TRANSAMINASE-LFT'S (ICD-790.4) FEEDING PROBLEM (  ICD-783.3) OSTEOPOROSIS (ICD-733.00) EDEMA OF MALE GENITAL ORGANS (ZOX-096.04) History splenectomy History left arm swelling-improved-no DVT Normal systolic LV function Diastolic dysfunction mild Status post PEG History quite positive stool ? CAD. This was never proven and catheter was never done Status  post scrotal edema when albumin was low Dyslipidemia... statins not used up to this point due to LFT abnormalities LFTs... elevated... related to Imuran dose being adjusted     Review of Systems       Patient denies fever, chills, headache, sweats, rash, change in vision, change in hearing, chest pain, cough, nausea vomiting, urinary symptoms.  All of the systems are reviewed and are negative.  Vital Signs:  Patient profile:   77 year old male Height:      65 inches Weight:      133 pounds BMI:     22.21 Pulse rate:   60 / minute BP sitting:   114 / 62  (left arm) Cuff size:   regular  Vitals Entered By: Hardin Negus, RMA (July 22, 2009 9:57 AM)  Physical Exam  General:  patient is stable. Eyes:  no xanthelasma. Neck:  no jugular venous distention. Lungs:  lungs are clear respiratory effort is nonlabored. Heart:  cardiac exam reveals S1 and S2.  There are no clicks or significant murmurs. Abdomen:  abdomen is soft. Msk:  patient has kyphosis of the cervical spine. Extremities:  no peripheral edema. Psych:  patient is oriented to person time and place.  Affect is normal.   Impression & Recommendations:  Problem # 1:  * DIASTOLIC DYSFUNCTION Patient has mild diastolic dysfunction.  He remains on low-dose Lasix.  He has recent labs from the outside he showed me.  Renal function and potassium are normal.  Problem # 2:  AORTIC INSUFFICIENCY, MILD (ICD-424.1)  His updated medication list for this problem includes:    Furosemide 80 Mg Tabs (Furosemide) .Marland Kitchen... Take 1 tablet by mouth once a day There is mild aortic insufficiency.  He does not need a followup echo at this time.  Problem # 3:  CHEST PAIN (ICD-786.50) The patient is not having any chest pain.  Problem # 4:  EDEMA (ICD-782.3) Is not having any edema at this time.  Fluid status is stable.  Problem # 5:  MYOSITIS (ICD-729.1) Myositis is being treated by Dr. Anne Hahn  Patient Instructions: 1)  Follow up in 6  months

## 2010-05-11 NOTE — Assessment & Plan Note (Signed)
Summary: f29m  Medications Added PREDNISONE 5 MG TABS (PREDNISONE) Take 1 tablet by mouth once a day      Allergies Added:   Visit Type:  Follow-up Referring Provider:  Lesia Sago, MD  Sallee Lange, MD Primary Provider:  Elizabeth Sauer, MD  CC:  diastolic CHF.  History of Present Illness: The patient is seen for followup of his diastolic CHF.  He is doing very well.  His volume status is stable.  Originally he was very ill with a diagnosis of myositis was made.  He is getting excellent care from his neurology team and his primary care team.  His prednisone dose is being decreased.  He had some volume overload and we have felt that it was from diastolic dysfunction.  There was question of the possibility of coronary disease.  However, there was never need for catheterization.  Current Medications (verified): 1)  Temazepam 30 Mg  Caps (Temazepam) .Marland Kitchen.. 1 By Mouth At Bedtime 2)  Lexapro 10 Mg  Tabs (Escitalopram Oxalate) .Marland Kitchen.. 1 By Mouth Daily 3)  Vitamin D3 1000 Unit  Tabs (Cholecalciferol) .Marland Kitchen.. 1 By Mouth Daily 4)  Klor-Con M20 20 Meq Cr-Tabs (Potassium Chloride Crys Cr) .... Take 1 Tablets By Mouth Once Daily/ Take An Extra Tab Twice A Week. 5)  Hydrocodone-Acetaminophen 5-500 Mg  Tabs (Hydrocodone-Acetaminophen) .Marland Kitchen.. 1 By Mouth Every 4-6 Hours As Needed 6)  Prednisone 5 Mg Tabs (Prednisone) .... Take 1 Tablet By Mouth Once A Day 7)  Furosemide 80 Mg Tabs (Furosemide) .... Take 1 Tablet By Mouth Once A Day 8)  Iron 325 (65 Fe) Mg Tabs (Ferrous Sulfate) .... Take 1 Tablet By Mouth Once Per Day. 9)  Zometa 4 Mg/39ml Conc (Zoledronic Acid) .... Iv Q88months 10)  Sucralfate 1 Gm Tabs (Sucralfate) .... Take 1 Tablet By Mouth Once Per Day. 11)  Vitamin B-12 1000 Mcg Tabs (Cyanocobalamin) .... Take 1 Tablet By Mouth Once A Day 12)  Fish Oil 1000 Mg Caps (Omega-3 Fatty Acids) .... Take 1 Tablet By Mouth Two Times A Day 13)  Multivitamins  Tabs (Multiple Vitamin) .... Take 1 Tablet By Mouth Once A  Day 14)  Calcium 500/d 500-200 Mg-Unit Tabs (Calcium Carbonate-Vitamin D) .... Take 1 Tablet By Mouth Two Times A Day 15)  Resource Beneprotein  Pack (Protein) .... 3g Three Times Per Day. 16)  Cyanocobalamin 1000 Mcg/ml Soln (Cyanocobalamin) .... Monthly  Allergies (verified): 1)  ! Sulfa  Past History:  Past Medical History: CLL s/p treatment with Rituxan 2006 Atrial septal aneurysm.. insignificant problem ...echo... January, 2009 PEG cellulitis Vertebral compression fractures-multiple levels Herpes zoster, right chest AORTIC INSUFFICIENCY, MILD (ICD-424.1) EF 60-65%... echo... January, 2009 / 55%.Marland Kitchen echo October, 2009.Marland Kitchenpossible inferior and posterior hypokinesis by the reading.  I have reviewed this echo and the findings are very borderline. Diastolic dysfunction.... mild CHEST PAIN (ICD-786.50) EDEMA (ICD-782.3) DYSPNEA (ICD-786.05). No primary pulmonary disease, but pulmonary difficulties related to muscle weakness or myositis. Also, some diastolic dysfunction ANEMIA (ICD-285.9) MYOSITIS (ICD-729.1)..followed by Dr. Anne Hahn.Marland KitchenImuran and prednisone therapy NEPHROLITHIASIS (ICD-592.0) INGUINAL HERNIA, RIGHT (ICD-550.90) GASTRITIS (ICD-535.50) ABDOMINAL PAIN, EPIGASTRIC (ICD-789.06) FAMILY HX COLON CANCER (ICD-V16.0) ABNORMAL TRANSAMINASE-LFT'S (ICD-790.4) FEEDING PROBLEM (ICD-783.3) OSTEOPOROSIS (ICD-733.00) EDEMA OF MALE GENITAL ORGANS (MWU-132.44) History splenectomy History left arm swelling-improved-no DVT Normal systolic LV function Diastolic dysfunction mild Status post PEG History quite positive stool ? CAD. This was never proven and catheter was never done Status post scrotal edema when albumin was low Dyslipidemia... statins not used up to this point due to  LFT abnormalities LFTs... elevated... related to Imuran dose being adjusted     Review of Systems       Patient denies fever, chills, headache, sweats, rash, change in vision, change in hearing, chest pain,  cough, nausea vomiting, urinary symptoms.  All other systems are reviewed and are negative.  Vital Signs:  Patient profile:   77 year old male Height:      65 inches Weight:      130 pounds BMI:     21.71 Pulse rate:   46 / minute BP sitting:   124 / 58  (left arm) Cuff size:   regular  Vitals Entered By: Hardin Negus, RMA (January 13, 2010 10:23 AM)  Physical Exam  General:  patient is stable. Eyes:  no xanthelasma. Neck:  no jugular venous distention. Lungs:  lungs are clear.  Respiratory effort is nonlabored. Heart:  cardiac exam reveals S1-S2.  No clicks or significant murmurs. Abdomen:  abdomen is soft. Extremities:  no peripheral edema. Psych:  patient is oriented to person time and place.  Affect is normal.   Impression & Recommendations:  Problem # 1:  * DIASTOLIC DYSFUNCTION The patient's volume status is stable.  No further workup is needed.  Problem # 2:  AORTIC INSUFFICIENCY, MILD (ICD-424.1)  His updated medication list for this problem includes:    Furosemide 80 Mg Tabs (Furosemide) .Marland Kitchen... Take 1 tablet by mouth once a day Aortic insufficiency is mild.  No change in therapy.  Problem # 3:  CHEST PAIN (ICD-786.50)  Orders: EKG w/ Interpretation (93000) He has not been having any chest pain.  EKG is done today and reviewed by me.  He has sinus bradycardia with old right bundle branch block.  No further workup.  Problem # 4:  DYSPNEA (ICD-786.05)  His updated medication list for this problem includes:    Furosemide 80 Mg Tabs (Furosemide) .Marland Kitchen... Take 1 tablet by mouth once a day Is not having any shortness of breath currently stable.  Problem # 5:  RBBB (ICD-426.4) This was documented on his EKG in October, 2010.  Right bundle-branch block persists.  No significant change.  Patient Instructions: 1)  Your physician wants you to follow-up in:  6 months.  You will receive a reminder letter in the mail two months in advance. If you don't receive a letter,  please call our office to schedule the follow-up appointment.

## 2010-05-12 ENCOUNTER — Ambulatory Visit: Payer: Self-pay | Admitting: Oncology

## 2010-06-10 ENCOUNTER — Ambulatory Visit: Payer: Self-pay | Admitting: Oncology

## 2010-06-30 ENCOUNTER — Encounter: Payer: Self-pay | Admitting: Cardiology

## 2010-07-02 ENCOUNTER — Ambulatory Visit: Payer: Self-pay | Admitting: Unknown Physician Specialty

## 2010-07-11 ENCOUNTER — Ambulatory Visit: Payer: Self-pay | Admitting: Oncology

## 2010-07-11 ENCOUNTER — Encounter: Payer: Self-pay | Admitting: Cardiology

## 2010-07-11 DIAGNOSIS — R0602 Shortness of breath: Secondary | ICD-10-CM | POA: Insufficient documentation

## 2010-07-11 DIAGNOSIS — M609 Myositis, unspecified: Secondary | ICD-10-CM | POA: Insufficient documentation

## 2010-07-11 DIAGNOSIS — R079 Chest pain, unspecified: Secondary | ICD-10-CM | POA: Insufficient documentation

## 2010-07-11 DIAGNOSIS — M4850XA Collapsed vertebra, not elsewhere classified, site unspecified, initial encounter for fracture: Secondary | ICD-10-CM | POA: Insufficient documentation

## 2010-07-11 DIAGNOSIS — C9110 Chronic lymphocytic leukemia of B-cell type not having achieved remission: Secondary | ICD-10-CM | POA: Insufficient documentation

## 2010-07-11 DIAGNOSIS — I253 Aneurysm of heart: Secondary | ICD-10-CM | POA: Insufficient documentation

## 2010-07-11 DIAGNOSIS — I5189 Other ill-defined heart diseases: Secondary | ICD-10-CM | POA: Insufficient documentation

## 2010-07-11 DIAGNOSIS — R609 Edema, unspecified: Secondary | ICD-10-CM | POA: Insufficient documentation

## 2010-07-11 DIAGNOSIS — B029 Zoster without complications: Secondary | ICD-10-CM | POA: Insufficient documentation

## 2010-07-11 DIAGNOSIS — E785 Hyperlipidemia, unspecified: Secondary | ICD-10-CM | POA: Insufficient documentation

## 2010-07-12 ENCOUNTER — Encounter: Payer: Self-pay | Admitting: Cardiology

## 2010-07-12 ENCOUNTER — Ambulatory Visit (INDEPENDENT_AMBULATORY_CARE_PROVIDER_SITE_OTHER): Payer: Medicare Other | Admitting: Cardiology

## 2010-07-12 DIAGNOSIS — I359 Nonrheumatic aortic valve disorder, unspecified: Secondary | ICD-10-CM

## 2010-07-12 DIAGNOSIS — I519 Heart disease, unspecified: Secondary | ICD-10-CM

## 2010-07-12 DIAGNOSIS — R079 Chest pain, unspecified: Secondary | ICD-10-CM

## 2010-07-12 DIAGNOSIS — IMO0001 Reserved for inherently not codable concepts without codable children: Secondary | ICD-10-CM

## 2010-07-12 DIAGNOSIS — I5189 Other ill-defined heart diseases: Secondary | ICD-10-CM

## 2010-07-12 DIAGNOSIS — I253 Aneurysm of heart: Secondary | ICD-10-CM

## 2010-07-12 DIAGNOSIS — M609 Myositis, unspecified: Secondary | ICD-10-CM

## 2010-07-12 DIAGNOSIS — R0602 Shortness of breath: Secondary | ICD-10-CM

## 2010-07-12 DIAGNOSIS — I351 Nonrheumatic aortic (valve) insufficiency: Secondary | ICD-10-CM

## 2010-07-12 MED ORDER — FUROSEMIDE 80 MG PO TABS: 80.0000 mg | ORAL_TABLET | Freq: Every day | ORAL | Status: DC

## 2010-07-12 MED ORDER — ESOMEPRAZOLE MAGNESIUM 40 MG PO CPDR: 40.0000 mg | DELAYED_RELEASE_CAPSULE | Freq: Two times a day (BID) | ORAL | Status: DC

## 2010-07-12 MED ORDER — POTASSIUM CHLORIDE CRYS ER 20 MEQ PO TBCR: 20.0000 meq | EXTENDED_RELEASE_TABLET | Freq: Every day | ORAL | Status: DC

## 2010-07-12 NOTE — Assessment & Plan Note (Signed)
Is mild diastolic dysfunction.  I believe this has played aerobic with his volume overload.  No further workup is needed.

## 2010-07-12 NOTE — Assessment & Plan Note (Signed)
He is not had any significant chest pain.  There was question of mild inferior posterior hypokinesis in the past.  His ejection fraction is normal.  Cardiac catheterization has never been done.  No further workup is needed.

## 2010-07-12 NOTE — Patient Instructions (Signed)
Your physician has requested that you have an echocardiogram. Echocardiography is a painless test that uses sound waves to create images of your heart. It provides your doctor with information about the size and shape of your heart and how well your heart's chambers and valves are working. This procedure takes approximately one hour. There are no restrictions for this procedure.  NEEDS IN 6 MONTHS Your physician wants you to follow-up in: 6 months. You will receive a reminder letter in the mail two months in advance. If you don't receive a letter, please call our office to schedule the follow-up appointment.

## 2010-07-12 NOTE — Assessment & Plan Note (Signed)
He is not having any significant shortness of breath.  He does have residual respiratory problem somewhat related to his kyphosis.  Overall he is stable.  No further workup is needed.

## 2010-07-12 NOTE — Assessment & Plan Note (Signed)
Aortic insufficiency was mild 3 years ago.  I do hear his murmur.  It is time for followup echo to reassess LV function, diastolic function, aortic insufficiency.  We will schedule is so that he hasn't time of his followup visit with me.

## 2010-07-12 NOTE — Assessment & Plan Note (Signed)
This problem is well treated.

## 2010-07-12 NOTE — Progress Notes (Signed)
HPI The patient is seen for cardiology followup for fluid overload and shortness of breath and aortic insufficiency.  [name] [name] is doing very well.  [name] [name] has a complex medical history.  [name] [name] had a severe illness several years ago it was primarily related to her myositis.  The period of time for this diagnosis to be made completely.  [name] [name] has mild diastolic dysfunction.  [name] [name] had respiratory insufficiency that was related to muscle weakness from his myositis.  Fortunately [name] [name]'s been very well treated by Dr. Anne Hahn.  [name] [name] remains on very low-dose prednisone.  [name] [name] is also followed for his CLL.  [name] [name] is not having any significant chest pain or shortness of breath.  [name] [name] is not having any significant edema . Allergies  Allergen Reactions  . Sulfonamide Derivatives     Current Outpatient Prescriptions  Medication Sig Dispense Refill  . calcium-vitamin D (CALCIUM 500+D) 500-200 MG-UNIT per tablet Take 1 tablet by mouth 2 (two) times daily.        . Cholecalciferol (VITAMIN D3) 1000 UNITS tablet Take 1,000 Units by mouth daily.        Marland Kitchen escitalopram (LEXAPRO) 10 MG tablet Take 10 mg by mouth daily.        Marland Kitchen esomeprazole (NEXIUM) 40 MG capsule Take 40 mg by mouth 2 (two) times daily.        . ferrous sulfate 325 (65 FE) MG tablet Take 325 mg by mouth daily with breakfast.        . fish oil-omega-3 fatty acids 1000 MG capsule Take one tablet by mouth two times daily       . Flaxseed, Linseed, (FLAX SEEDS PO) Take 1,200 mg by mouth daily.        . furosemide (LASIX) 80 MG tablet Take 80 mg by mouth daily.        Marland Kitchen HYDROcodone-acetaminophen (VICODIN) 5-500 MG per tablet Take 1 tablet by mouth. Every 4-6 hours as needed       . Multiple Vitamin (MULTIVITAMIN) capsule Take 1 capsule by mouth daily.        . mycophenolate (CELLCEPT) 500 MG tablet Take 500 mg by mouth 2 (two) times daily.        . potassium chloride SA (KLOR-CON M20) 20 MEQ tablet Take one tablet by mouth once daily. Take an extra tablet twice a week.       .  predniSONE (DELTASONE) 1 MG tablet Take 1 mg by mouth. 3 tabs daily       . Resource Beneprotein PACK Take 3g three times a day       . sucralfate (CARAFATE) 1 G tablet Take 1 g by mouth 3 (three) times daily.       . temazepam (RESTORIL) 30 MG capsule Take 30 mg by mouth at bedtime.        . vitamin B-12 (CYANOCOBALAMIN) 1000 MCG tablet Take 1,000 mcg by mouth daily.        . vitamin B-12 (CYANOCOBALAMIN) 1000 MCG tablet Inject 1,000 mcg as directed every 30 (thirty) days.        Marland Kitchen zolendronic acid (ZOMETA) 4 MG/5ML injection Inject 4 mg into the vein. Every six mouths       . DISCONTD: cyanocobalamin (,VITAMIN B-12,) 1000 MCG/ML injection Take 1,000 mcg by mouth daily.       Marland Kitchen DISCONTD: predniSONE (DELTASONE) 5 MG tablet Take 5 mg by mouth daily.          History   Social History  .  Marital Status: Widowed    Spouse Name: N/A    Number of Children: N/A  . Years of Education: N/A   Occupational History  . Not on file.   Social History Main Topics  . Smoking status: Former Smoker -- 0.5 packs/day for 31 years    Types: Cigarettes    Quit date: 06/29/1980  . Smokeless tobacco: Not on file  . Alcohol Use: Yes     rarely  . Drug Use: No  . Sexually Active: Not on file   Other Topics Concern  . Not on file   Social History Narrative  . No narrative on file    Family History  Problem Relation Age of Onset  . Breast cancer Sister   . Colon cancer Brother   . Prostate cancer Brother   . Stroke Mother   . Emphysema Father   . Diabetes Grandchild     Past Medical History  Diagnosis Date  . CLL (chronic lymphoblastic leukemia) 2006    Rituxan treatment 2006  . Atrial septal aneurysm 2009    Insignificant, no, January, 2009  . Infection of PEG site     cellulitis.Marland KitchenResolved  . Vertebral compression fracture     multiple levels  . Herpes zoster     right chest  . Aortic insufficiency 2009    mild  . Diastolic dysfunction     mild  . Chest pain     EF 55%, echo,  October, 2009, possible inferior and posterior borderline hypokinesis, patient has never  had cardiac catheterization  . Edema     EF 55%, echo, October, 2009, possible borderline inferior and posterior hypokinesis... heart catheterization has never been done(July 10, 2009)  . Myositis     Caused pulmonary insufficiency with muscle weakness in the past / Dr.Willis-neurology, Imuran and prednisone  . Calculus of kidney   . Inguinal hernia without mention of obstruction or gangrene, unilateral or unspecified, (not specified as recurrent)     right   . Unspecified gastritis and gastroduodenitis without mention of hemorrhage   . Abdominal pain, epigastric   . Nonspecific elevation of levels of transaminase or lactic acid dehydrogenase (LDH)   . Feeding difficulties and mismanagement   . Edema of male genital organs     Resolved, occurred during illness related to myositis  . Swelling of arm     left.Marland KitchenMarland KitchenResolved.... no DVT  . LFT elevation     Related to Imuran in the past... dose was adjusted  . Dyslipidemia     statins not used due to LFT abnormalities  . Shortness of breath     Breathing abnormalities related to muscle weakness from myositis    Past Surgical History  Procedure Date  . Splenectomy   . Hernia repair     left side  . Peg placement 06/2007  . Peg tube removal 12/2007  . Kyphosis surgery     multilevel vertebral  . Odontoid fracture surgery     anterior screw fixation  . Nasal sinus surgery     x2  . Cataract extraction     bi lateral    ROS  Patient denies fever, chills, headache, sweats, rash, change in vision, change in hearing, chest pain, cough, nausea vomiting, urinary symptoms.  All other systems are reviewed and are negative. PHYSICAL EXAM Patient is oriented to person time and place.  Affect is normal.  Head is atraumatic.  There is no xanthelasma or no carotid bruits.  No jugular venous distention.  [name] [name] has kyphosis of the spine.  Lungs are clear.   Respiratory effort is nonlabored.  Cardiac exam reveals S1 and S2.  There is a murmur of aortic insufficiency heard.  The abdomen is soft.  There is no peripheral edema.  There are no musculoskeletal deformities of the spine.  There no skin rashes. Filed Vitals:   07/12/10 1125  BP: 122/56  Pulse: 50  Height: 5\' 5"  (1.651 m)  Weight: 134 lb (60.782 kg)    EKG No EKG is done today.  ASSESSMENT & PLAN

## 2010-07-12 NOTE — Assessment & Plan Note (Signed)
Clinically this has never been a problem.  No further workup is needed.

## 2010-07-21 LAB — BASIC METABOLIC PANEL
CO2: 31 mEq/L (ref 19–32)
Calcium: 9.3 mg/dL (ref 8.4–10.5)
GFR calc Af Amer: >60 mL/min (ref >60)
Sodium: 141 mEq/L (ref 135–145)

## 2010-07-21 LAB — HEMOGLOBIN AND HEMATOCRIT, BLOOD
HCT: 45.5 % (ref 39.0–52.0)
Hemoglobin: 14.6 g/dL (ref 13.0–17.0)

## 2010-07-21 LAB — CBC
HCT: 45.2 % (ref 39.0–52.0)
MCHC: 32.4 g/dL (ref 30.0–36.0)
Platelets: 282 10*3/uL (ref 150–400)
RDW: 15.8 % — ABNORMAL HIGH (ref 11.5–15.5)

## 2010-07-21 LAB — DIFFERENTIAL
Basophils Absolute: 0 10*3/uL (ref 0.0–0.1)
Basophils Relative: 1 % (ref 0–1)
Eosinophils Relative: 1 % (ref 0–5)
Lymphocytes Relative: 30 % (ref 12–46)
Neutro Abs: 4.3 10*3/uL (ref 1.7–7.7)

## 2010-08-10 ENCOUNTER — Ambulatory Visit: Payer: Self-pay | Admitting: Oncology

## 2010-08-24 NOTE — Consult Note (Signed)
[name] [name], [name] [name]                ACCOUNT NO.:  [id number]   MEDICAL RECORD NO.:  [id number]          PATIENT TYPE:  INP   LOCATION:  4713                         FACILITY:  [facility]   PHYSICIAN:  Gabrielle Dare. Janee Morn, M.D.DATE OF BIRTH:  [birth year]   DATE OF CONSULTATION:  06/15/2007  DATE OF DISCHARGE:                                 CONSULTATION   REQUESTING PHYSICIAN:  Marlan Palau, M.D.   PRIMARY CARE PHYSICIAN:  __________ , M.D./Bruce Gwen Pounds, M.D.   PRIMARY CARDIOLOGIST:  Luis Abed, M.D., [facility].   PULMONOLOGIST:  Coralyn Helling, M.D.   REASON FOR CONSULTATION:  Need for muscle biopsy to confirm diagnosis.   HISTORY OF PRESENT ILLNESS:  The patient is a 77 year old male with a  history of chronic dyspnea.  He was last hospitalized in January 2009  after falling.  At that time he sustained a type 2 odontoid fracture and  this was repaired by Dr. Danielle Dess.  The patient reports that prior to the  fall, beginning in December 2008, he had noticed significant muscle  weakness and difficulty in walking, and his chronic dyspnea has  worsened.  Prior to this admission he was already in the process of an  outpatient evaluation with Dr. Anne Hahn to evaluate the proximal muscle  weakness.  The patient was readmitted to the hospital on June 08, 2007 by cardiology because of progressive dyspnea.  No cardiac etiology  has been determined for his dyspnea.  CT angio ruled out pulmonary  embolus and because of the proximal muscle weakness neurology consult  was obtained and the patient as of today has actually briefly left the  hospital to be evaluated by Dr. Anne Hahn.  He has undergone muscle nerve  stimulation test.  He has also been receiving Mestinon challenge to rule  out myasthenia gravis.  The patient has returned from the neurologist's  office; and, based on verbal report and the findings of the muscle  stimulation test they have at least potentially ruled out myasthenia  gravis.   Dr. Anne Hahn has notified us that he needs a muscle biopsy to  confirm proximal muscle weakness etiology probably due to myositis.   REVIEW OF SYSTEMS:  The review of systems is as per history the present  illness.  RESPIRATORY:  Again, the patient reports his has had chronic  dyspnea, but it has become much more severe and seems to be different  than his prior dyspnea.  This has been going on since December 2008.  NEUROLOGIC:  Neurologically the patient, again, has weakness with his  gait and upper extremities, which was before he fell; this was in  December 2008.  He did drag his feet with his gait prior to this;  however, this has slightly improved after treatment for the odontoid  fracture.  The patient states that because of the weakness is the reason  why he fell and why he sustained the fracture.  Otherwise all other  categories of the review of systems are negative.   FAMILY HISTORY:  The family medical history noncontributory.   SOCIAL HISTORY:  The patient  lives in [place].  He has a remote  history of tobacco use 32 years ago.  No alcohol.   PAST MEDICAL HISTORY:  1. Chronic dyspnea, uncertain etiology; questioning neurogenic.  2. Normal LV systolic function, EF 60%, with an atrioseptal aneurysm      seen on echo.  3. Known diastolic dysfunction, on Lasix for heart failure.  4. Chronic lymphocytic leukemia.  5. Dyslipidemia.  6. Prior renal calculi.  7. Chronic anemia, macrocytic   PAST SURGICAL HISTORY:  1. Splenectomy.  2. Prior hernia repair.  3. Repair of odontoid fracture, May 10, 2007, by Dr. Danielle Dess.   ALLERGIES:  SULFA WHICH CAUSES A RASH.   MEDICATIONS:  Current medications include Lexapro, Restoril, ferrous  sulfate, Ditropan, potassium twice a day, IV Lasix, Mestinon, Pepcid,  Lovenox, which has not been given today, Ancef, Osmolite tube feedings  1.2 at 60 mL an hour and as needed medication includes Percocet.   PHYSICAL EXAMINATION:  GENERAL  APPEARANCE:  In general this is an alert  male patient who appears his stated age with 3-4 months of increasing  proximal muscle weakness contributed to a fall in January 2009.  VITAL SIGNS:  Temperature 98.5, BP 117/43, pulse 73 and regular, and  respirations 20.  HEENT:  Eyes; sclerae noninjected and conjunctivae are pink.  Pupils are  equal, round and react to light.  Ears, nose, mouth and throat; ears  appear normal.  Otoscopic exam is deferred.  Nose without any obvious  drainage.  Mouth is pink and moist.  Throat proximally does not appear  to have any exudate.  NECK:  The neck is difficult to examine as the patient is in a hard  cervical collar.  Trachea is midline.  Unable to appreciate whether the  thyroid is palpable or not, or if there are any masses due to the  cervical collar being in place.  LUNGS:  Respiratory effort is normal.  He is on nasal cannula O2 at 2  liters/minute and sating 96-98%.  HEART:  Cardiovascular - mostly S1 and S2 with a soft grade 2/6 systolic  murmur at the right sternal border second costal space.  No JVD.  He is  in sinus rhythm on the bedside telemetry monitor.  Pulses; radial and  pedal are 1+ bilaterally.  CHEST AND BREASTS:  The chest and breast exams are deferred.  ABDOMEN:  The abdomen is soft.  Bowel sounds are present.  The abdomen  is nontender.  No obvious hepatosplenomegaly.  He does have what appears  to be a pulsatile mass in the left upper quadrant epigastric area.  This  mass is about 3.5-4 cm on palpation.  A soft bruit is heard in the mass  as well although this could be radiation of the cardiac murmur.  LYMPHATICS:  The lymphatic exam is deferred.  MUSCULOSKELETAL:  The musculoskeletal exam overall is symmetrical.  The  patient does have left upper extremity edema, which apparently has been  chronic since the cervical neck surgery.  Prior Doppler showed DVT  otherwise the extremities are symmetrical in appearance without  clubbing  or cyanosis.  No atrophy.  NEUROLOGIC EXAMINATION:  Neuro show the cranial nerves II-XII are  grossly intact.  Gait is not assessed.  Upper and lower extremity  sensation is intact per neurology notes.  I have not directly examined  the patient's reflexes or test his strength, but it is documented as  proximal muscle weakness in the upper and lower extremities with normal  DTRs and normal Babinski's.  PSYCHIATRIC:  The patient is oriented to time, person, place and  situation.  Normal affect.   LABORATORY DATA:  The ESR is 85.  ANA negative.  Sodium 137, potassium  3.7, CO2 34, BUN 29 and creatinine 0.55.  White count 12,300, hemoglobin  10, platelets 310,000 and MCV 101.6.  Diagnostics; noncontributory exams  based on our request for seeing the patient today.   IMPRESSION:  Proximal muscle weakness with request for deltoid muscle  biopsy to confirm diagnosis   PLAN:  1. The patient has been nothing per os since 9:00 A.M., i.e. tube      feedings have been off and he has not yet received his Lovenox.  We      will continue to keep him nothing per os.  The patient's R.N. has      been made aware not to give Lovenox and we plan to proceed with      muscle biopsy later today at around 3 o'clock if are scheduled can      accommodate.  2. The patient's family is the room and I discussed the possibility of      surgery with them and they are amenable.  The patient would like to      proceed with surgery as soon as possible so he can begin steroid      therapy for treatment of his symptoms.      Allison L. Rennis Harding, N.P.      Gabrielle Dare Janee Morn, M.D.  Electronically Signed    ALE/MEDQ  D:  06/15/2007  T:  06/16/2007  Job:  [id number]   cc:   Marlan Palau, M.D.  Luis Abed, MD, [facility]  Coralyn Helling, MD

## 2010-08-24 NOTE — Assessment & Plan Note (Signed)
[facility]                            CARDIOLOGY OFFICE NOTE   NAME:POWELLUri, Turnbough                         MRN:          [id number]  DATE:05/25/2007                            DOB:          Oct 25, 1929    PRIMARY CARDIOLOGIST:  Willa Rough, MD.   PULMONOLOGIST:  Coralyn Helling, MD.   Ted Mcalpine:  Barnett Abu, MD.   PATIENT PROFILE:  A 77 year old Caucasian male with history of diastolic  heart failure who was last seen in clinic just 3 days ago.  Presents  back secondary to dysphagia.   PROBLEMS:  1. Dysphagia.  2. Chronic diastolic congestive heart failure.  3. Status post odontoid process fracture with repair by Dr. Danielle Dess      approximately 2 weeks ago.  4. Chronic dyspnea on exertion.  5. History of nephrolithiasis.  6. History of trivial aortic insufficiency.  7. History of atrial septal aneurysm by 2D echocardiogram of no      clinical significance.  8. Status post splenectomy.  9. History of chronic lymphocytic leukemia status post Rituxan therapy      2 years ago.   ALLERGIES:  SULFA.   HISTORY OF PRESENT ILLNESS:  A 77 year old Caucasian male with the above  problem list.  Earlier this week he had noted some peripheral edema and  was placed on HCTZ on Monday.  He followed up with Dr. Willa Rough on  Tuesday, February 10th, and his HCTZ was discontinued and he was placed  on Lasix, which he did not start until Wednesday.  He began to  experience difficulty swallowing without pain, on Tuesday prior to ever  starting the Lasix.  Since then his dysphagia has worsened and he has  also noted that over the past 4 or 5 days he has been coughing when he  is trying to swallow or drink fluids.  He is having trouble getting his  pills down.  His daughter called in this morning and asked that he be  seen as she feels as though he must be having an allergic reaction to  the Lasix, despite his symptoms starting at least 1 and possible 2 days  prior to ever being on Lasix.  From a heart failure standpoint, he has  lost roughly 8 pounds since going on Lasix and he has just mild lower  extremity edema at this point.  He has had some improvement in his  dyspnea and feels that he is back to baseline.  He has not noted any  upper airway constriction or wheezing.  He does have followup arranged  with Dr. Danielle Dess this afternoon for an x-ray.   HOME MEDICATIONS:  1. Temazepam 30 mg at bedtime.  2. Lexapro 10 mg daily.  3. B12 Vitamin 1000 mcg daily.  4. D3 1000 international units daily.  5. Multivitamin daily.  6. Klor-Con 20 mEq daily.  7. Furosemide 40 mg daily.   PHYSICAL EXAM:  Blood pressure 128/70, heart rate 75, respirations 16,  his weight is 150 pounds, which is down 9 pounds since the last visit.  A pleasant white male  in no acute distress, awake, alert and oriented  x3.  HEENT:  He has got normal lids and his ENT exam is grossly normal with  the exception of some erythema and thickening of the tongue and erythema  of the pharynx.  NECK:  No bruits or JVD.  LUNGS:  Respirations regular and labored, clear to auscultation.  CARDIAC:  Regular S1 S2.  No S3, S4 or murmurs.  ABDOMEN:  Round, soft, nontender, nondistended.  Bowel sounds present  x4.  EXTREMITIES:  Warm, dry, pink with 1+ bilateral lower extremity edema  above the sock line.   ACCESSORY CLINICAL FINDINGS:  Electrocardiogram showed sinus rhythm  without acute ST or T changes.   ASSESSMENT AND PLAN:  1. Dysphagia.  Given the timing of initiation of Lasix and onset of      symptoms (which occurred at least 1 if not 2 days prior to      initiation of Lasix) it seems very unlikely that this is an      allergic reaction to Lasix.  I am more concerned that he may have      some sort of upper airway obstruction along with possible      aspiration, given that the patient is having trouble getting food,      pills and fluids down and also is coughing any time he  tries to      swallow.  In the setting of a recent neurosurgery involving his      neck as well as intubation, I question whether there could be an      inflammatory process affecting his vocal cords causing aspiration.      I have arranged for him to be seen today at [facility], Nose      Throat.  I have not made any changes to his Lasix.  He also is to      follow up with Dr. Danielle Dess this afternoon.  2. Diastolic heart failure.  Patient is nearing euvolemia with just      some residual lower extremity edema which appears to be dependent      in nature.  I have not made any changes to his Lasix dosing, as      above, and he will follow up with Dr. Myrtis Ser on Tuesday and will have      a followup BMET at that time.      Nicolasa Ducking, ANP  Electronically Signed      Pricilla Riffle, MD, [facility]  Electronically Signed   CB/MedQ  DD: 05/25/2007  DT: 05/27/2007  Job #: [id number]

## 2010-08-24 NOTE — Assessment & Plan Note (Signed)
[facility]                                 ON-CALL NOTE   NAME:POWELLKaicen, [name] [name]                         MRN:          [id number]  DATE:05/24/2007                            DOB:          1929/09/18    Telephone conversation with Rogene Houston concerning [name], a  patient of Dr. Myrtis Ser.  I received a page through the answering service  from Ms. Kueider concerning her father.  She states the patient had just  seen Dr. Myrtis Ser on May 22, 2007 and was given a prescription for  Lasix 40 mg daily.  She states since beginning this medication, her  father has complained of his throat swelling to the point that it is  difficult for him to swallow.  She states that he has not had any  shortness of breath or difficulty breathing, just the difficulty in  swallowing as if his throat is closing up.  I asked her for how long  this has been going on.  She states since he began the medicine on  May 23, 2007.  After clarifying he was in no acute distress at that  moment, I relayed to her that this could become an emergency situation  if his throat closed enough to impair his airway.  She stated that she  was aware of that.  She appeared to be more concerned that he had been  given a medicine that he could have a reaction to than proceeding with  any form of treatment at this time.  She states her father refused to go  to the emergency room.  I instructed her to give him Benadryl and to  stop the Lasix.  If the patient chose not to be seen in the emergency  room, I could not do anything about that, but that this could become a  rather critical situation if it progressively got worse.  She then asked  if she could just bring her father in the morning and get checked out.  I instructed her to call our office in the morning in that situation and  have him evaluated, but if that his status changed this evening at all,  that she needs to call 911.  She verbalized  understanding.      Dorian Pod, ACNP  Electronically Signed      Luis Abed, MD, [facility]  Electronically Signed   MB/MedQ  DD: 05/24/2007  DT: 05/26/2007  Job #: [id number]

## 2010-08-24 NOTE — Assessment & Plan Note (Signed)
[facility]                         GASTROENTEROLOGY OFFICE NOTE   NAME:POWELLBurwell, Bethel                         MRN:          [id number]  DATE:07/19/2007                            DOB:          12/05/1929    This is a return office visit for a PEG site cellulitis.  He has had a  substantial decrease in drainage from the PEG site.  The drainage is  minimal at this point.  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  His PEG tube is functioning  well.  The patient's daughter has concerns about interactions of Cipro  with calcium and iron.   CURRENT MEDICATIONS:  Current medications listed on the chart,  updated  and reviewed.   MEDICATION ALLERGIES:  SULFA DRUGS LEADING TO A RASH, AND KEFLEX LEADING  TO A RASH.   PHYSICAL EXAMINATION:  Well-developed, well-nourished elderly white male  in no acute distress.  Weight 131.6 pounds.  Blood pressure is 104/50.  Pulse 72 and regular.  CHEST:  Clear to auscultation bilaterally.  CARDIAC:  Regular rate and rhythm, without murmurs.  ABDOMEN:  Soft and nontender with normoactive bowel sounds.  No palpable  organomegaly, masses, or hernias.  There is a very fine erythematous  eruption on his trunk and abdomen.  The PEG site appears clean and dry.  There is no evidence of erythema, fluctuance, or tenderness.  I cannot  express any pus or drainage from the PEG site at this time.   ASSESSMENT AND PLAN:  Resolving percutaneous endoscopic gastrostomy site  cellulitis on Prednisone.  Complete the current course of Cipro.  Hold  Os-Cal and iron until Cipro has been completed.  I will see back on an as needed basis.     Venita Lick. Russella Dar, MD, [facility]  Electronically Signed    MTS/MedQ  DD: 07/19/2007  DT: 07/19/2007  Job #: [id number]   cc:   Marlan Palau, M.D.

## 2010-08-24 NOTE — Op Note (Signed)
[name] [name], [name] [name]                ACCOUNT NO.:  [id number]   MEDICAL RECORD NO.:  [id number]          PATIENT TYPE:  INP   LOCATION:  2899                         FACILITY:  [facility]   PHYSICIAN:  Stefani Dama, M.D.  DATE OF BIRTH:  1929-09-15   DATE OF PROCEDURE:  10/30/2007  DATE OF DISCHARGE:                               OPERATIVE REPORT   PREOPERATIVE DIAGNOSIS:  Pathologic compression fractures; T11, T12, and  L3.   POSTOPERATIVE DIAGNOSIS:  Pathologic compression fractures; T11, T12,  and L3.   PROCEDURE:  Acrylic balloon kyphoplasty of T11, T12, and L3.   INDICATIONS:  [name] [name] is a 77 year old individual who was seen in  the office about 2 weeks ago.  He was complaining significantly of back  pain and he has had evidence of pathologic compression fractures on  plain x-ray at T12 and L3.  An MRI was ordered and this was performed a  few days ago.  This was performed primarily to make sure that there was  not any bone retropulsed into the spinal canal or any spinal stenosis,  however, on the MRI, it is noted that he has pathologic compression  fractures of T11, T12, and L3.  He is now being taken to the operating  room to undergo acrylic balloon kyphoplasty of these 3 vertebrae.  The  MRI was reviewed prior to the surgery with the patient.  The initial  consent that was obtained was actually only for T12 and L3  kyphoplasties; however, the patient and the family are in agreement that  all 3 vertebrae should be performed.   PROCEDURE:  The patient was brought to the operating room and placed  supine on the stretcher.  After smooth induction of general endotracheal  anesthesia, he was turned prone and was carefully placed on padded  rolls.  The back was prepped with alcohol and DuraPrep, and then  fluoroscopy was brought in the AP and lateral projections to expose  initially the vertebral body of L3.  At L3, on the left lateral side,  initially the skin was  anesthetized with local and an area of entry for  a Jamshidi-type needle that was placed to the lateral aspect of the  pedicle of L3.  With fluoroscopic guidance, the needle was advanced into  the vertebral body and then a core biopsy was attempted to be taken.  The bone was very soft and no bone biopsy could be obtained.  A balloon  was inserted in this area and it was inflated with rapid deterioration  from 150 mmHg down to 80 mmHg.  The right side was then instrumented in  a similar fashion and balloon was inflated in this area with similar  deteriorations of the inflation pressures.  When adequate restoration of  height was performed, attention was turned to the T11 and T12.  At T12,  a left-sided balloon was placed towards the midline and this was  inflated with resistance of approximately 120 toward and noted  consistently.  At T11, a balloon was inserted and there was noted to be  little resistance.  A core biopsy was taken from the T12 vertebrae and  this was sent for pathologic examination.  T11 vertebrae was clearly  much softer.  No cord could be obtained from this vertebrae.  A singular  balloon was inserted from the left side only.  Glue was mixed at this  point where it reaches appropriate consistency.  The balloon was  deflated in T11 and was inserted and approximately 4.5 mL of glue was  inserted into the vertebrae filling it nicely, maintaining a fairly good  height of the T11 vertebrae.  At T12, approximately 3 mL of glue was  inserted and this filled the vertebrae quite nicely.  L3 was then filled  bilaterally and here approximately 7 mL of glue was inserted filling the  vertebrae nicely from both sides and no extravasation of glue was noted  in any other filling sites.  Once completed, the glue filling trocars  were  removed and then the Jamshidi trocars were removed.  No tails of the  glue were found.  Final radiographs were obtained for reference and the  incisions  were closed with 3-0 Vicryl in inverted interrupted fashion.  The patient tolerated the procedure well.  Blood loss was nil.      Stefani Dama, M.D.  Electronically Signed     HJE/MEDQ  D:  10/30/2007  T:  10/31/2007  Job:  [id number]

## 2010-08-24 NOTE — Op Note (Signed)
[name], [name]                ACCOUNT NO.:  [id number]   MEDICAL RECORD NO.:  [id number]          PATIENT TYPE:  INP   LOCATION:  3303                         FACILITY:  [facility]   PHYSICIAN:  Stefani Dama, M.D.  DATE OF BIRTH:  Jun 24, 1929   DATE OF PROCEDURE:  05/14/2007  DATE OF DISCHARGE:                               OPERATIVE REPORT   PREOPERATIVE DIAGNOSIS:  Type 2 odontoid fracture with posterior  displacement   POSTOPERATIVE DIAGNOSIS:  Type 2 odontoid fracture with posterior  displacement.   OPERATION:  Anterior screw fixation of odontoid fracture.   SURGEON:  Dr. Danielle Dess.   FIRST ASSISTANT:  Dr. Delma Officer.   ANESTHESIA:  General endotracheal.   INDICATIONS:  [name] [name] is a 77 year old individual who sustained a  type 2 odontoid fracture.  There is approximately 6 mm of posterior  subluxation.  This seemed to have worsened over a couple days.  While  staying in the hospital, he had significant neck pain.  He was advised  regarding odontoid screw fixation.   PROCEDURE:  The patient brought to the operating room, placed on the  table in the supine position.  After smooth induction of general  endotracheal anesthesia, head was placed in a three-pin headrest, and  then fluoroscopy was guided in the AP and lateral projection to identify  the fracture and then check its reduction.  Its reduction was adjusted  with the patient asleep, and when proper positioning was obtained, the  neck was prepped with alcohol and DuraPrep and draped in a sterile  fashion.  Transverse incision was made in the midportion of neck and  carried down through the platysma.  Plane between the  sternocleidomastoid and strap muscles were dissected bluntly until the  prevertebral space was reached.  Space was then dissected superiorly to  expose the bottom of C2, and then a K-wire was placed into the base of  C2, and fluoroscopy was checked in the AP and lateral projections. A  near  appropriate trajectory was selected.  The K-wire was advanced into  the fracture fragment of C2 using biplane fluoroscopy to check its  position.  The screw length was measured a maximum of 42 mm.  It was  decided to use a shorter screw in over the K-wire.  Then, we passed a  drill to the 42 mm depth.  The K-wire was carefully guided and  protected.  The soft tissues were retracted so as not to be involved in  the drilling.  The drill was then removed, and a 38-mm lag screw was  used and placed into the fracture and guided to the lag the fracture  fragment down into the vertebral body of C2.  All of the time  fluoroscopy was checked, when the screw was finally tightened down, the  guides were removed.  Screw head was checked and was noted be in good  position.  Soft-tissue hemostasis was obtained.  The  platysma was then closed with 3-0 Vicryl in interrupted fashion, 3-0  Vicryl was used in the subcuticular tissues, and a dry sterile dressing  was placed on the skin.  The patient tolerated the procedure well and  was returned to the recovery in stable condition.      Stefani Dama, M.D.  Electronically Signed     HJE/MEDQ  D:  05/14/2007  T:  05/15/2007  Job:  [id number]

## 2010-08-24 NOTE — Op Note (Signed)
NAMEASIER, DESROCHES                ACCOUNT NO.:  [id number]   MEDICAL RECORD NO.:  [id number]          PATIENT TYPE:  OIB   LOCATION:  1526                         FACILITY:  [facility]   PHYSICIAN:  Thornton Park. Daphine Deutscher, MD  DATE OF BIRTH:  [birth year]   DATE OF PROCEDURE:  07/18/2008  DATE OF DISCHARGE:                               OPERATIVE REPORT   PREOPERATIVE DIAGNOSIS:  Right inguinal hernia.   POSTOPERATIVE DIAGNOSIS:  Right inguinal hernia, large, direct.   SURGEON:  Dr. Luretha Murphy.   PROCEDURE:  Right inguinal herniorrhaphy with Marlex type mesh  reinforcing a Shouldice type repair of a large direct hernia.   DESCRIPTION OF PROCEDURE:  Mr. Record was taken to room six at [facility] on Friday, July 18, 2008 and given general anesthesia.  The  inguinal region was clipped and then prepped with a chlorhexidine  equivalent, and draped sterilely.  An oblique incision was made and  carried down.  I  mobilized the cord structure and opened up his  external oblique fascia which was already being very splayed and torn.  The cord itself was intact and there was no evidence of an indirect  hernia.  However, the floor was completely destroyed pretty much and a  large bulging direct hernia was present throughout.  I got a piece of  Marlex type mesh ready but first incised the large dome of this direct  hernia and then did a vest over pants type Shouldice repair to remove  some of the redundancy.  This was done with 2-0 silk.  The mesh was then  cut and placed over the whole area and sewn around the perimeter with  running 2-0 Prolene.  It was brought around the cord structures and  sutured to itself.  I also tightened up the exit site for the cord  structures.  The area was infiltrated with a lidocaine, Marcaine mixture  and the external oblique was closed with a running 2-0 Vicryl.  4-0  Vicryl was used in the subcutaneous tissue and subcuticularly and with  Benzoin and Steri-Strips.   The patient seemed to tolerate the procedure  well and was taken to the recovery room in  satisfactory condition.  The patient will be kept overnight and be  discharged home on some Tylox as needed for pain.   FINAL DIAGNOSIS:  Right direct inguinal hernia status post repair with  mesh.      Thornton Park Daphine Deutscher, MD  Electronically Signed     MBM/MEDQ  D:  07/18/2008  T:  07/18/2008  Job:  [id number]   cc:   D.C. Yetta Barre, M.D.   Marlan Palau, M.D.  Fax: [phone]   Stefani Dama, M.D.  Fax: [phone]

## 2010-08-24 NOTE — Letter (Signed)
July 04, 2007    [employer]  [address] [place], [place] [address]   RE:  [name], [name]  MRN:  [id number]  /  DOB:  1929/05/31   To Whom It May Concern:   I am writing on behalf of a Mr. [name] [name].  Along with his letter is  the form that he filled out requesting that he be excused from jury  duty.   [name] [name] was recently in the hospital for several weeks with a life-  threatening illness.  He has a feeding tube and he had a fractured neck  and his medical care is quite complicated.  It is impossible for him to  serve jury duty at this time.   My purpose in writing is to document that [name] [name] is not able  to do his jury duty.  Please see his attached form.  Thank you for your  consideration in this matter.    Sincerely,      Luis Abed, MD, [facility]  Electronically Signed    JDK/MedQ  DD: 07/04/2007  DT: 07/04/2007  Job #: [id number]

## 2010-08-24 NOTE — Assessment & Plan Note (Signed)
HEALTHCARE                            CARDIOLOGY OFFICE NOTE   NAME:POWELLMiron, Marxen                         MRN:          [id number]  DATE:08/08/2007                            DOB:          [birth year]    HISTORY:  [name] [name] is seen in followup and he is improving.  He has  seen Dr. Anne Hahn and he is soon to start on Imuran after he is off of his  antibiotics.  He is on antibiotics for an area of inflammation around  his PEG site and this is improving per the patient.  His swallowing is  definitely improving.  I have seen a preliminary of a muscle biopsy that  was reported from [facility].  It says final report.  I am not sure if  this is the report of the data that was sent to [facility].  I will  be sure a copy of this goes to Dr. Anne Hahn.   The patient continues to get stronger.  He does have some shortness of  breath.  His volume status has been stable.  He is gaining some weight,  but this is true body weight.   ALLERGIES:  1. SULFA--rash.  2. Rash from [facility].   MEDICATIONS:  1. Os-Cal.  2. Iron.  3. Nexium.  4. Furosemide.  5. Lexapro.  6. Potassium food supplements for his feeding tube.  7. Prednisone 40.  8. Temazepam.  9. Antibiotic--I am not sure what it is b.i.d.  10.Imuran is to be started at a later date when everything is stable.   OTHER MEDICAL PROBLEMS:  See the complete list on the note of July 04, 2007.   REVIEW OF SYSTEMS:  He continues to improve and I am very pleased with  his progress.   PHYSICAL EXAMINATION:  GENERAL:  The patient is here with his daughter  today.  He is oriented to person, time and place.  His affect is normal.  VITAL SIGNS:  Blood pressure 114/56 with a pulse of 84.  LUNGS:  Reveal fine crackles at his bases.  I have heard these  intermittently.  I believe this is not a sign of volume overload.  HEENT:  Reveals no xanthelasma.  He has normal extraocular motion.  NECK:  There are no carotid  bruits.  There is no jugular venous  distention.  CARDIAC:  Reveals S1-S2.  There are no clicks or significant murmurs.  EXTREMITIES:  He has no significant peripheral edema.   PROBLEMS:  The problems are listed extensively on the note of July 04, 2007 and reviewed very carefully.  #7.  Shortness of breath.  This is improving and he will be followed  over time.  #10.  Myositis.  This is followed by Dr. Lesia Sago and hopefully  Imuran will be started soon.  #17.  Question of coronary disease.  Cath was never done and I feel it  is not indicated at this point.  His volume status is stable.   FOLLOW UP:  I will see him back in 8 weeks.  Luis Abed, MD, [facility]  Electronically Signed    JDK/MedQ  DD: 08/08/2007  DT: 08/08/2007  Job #: [id number]   cc:   Marlan Palau, M.D.  Guido Sander, MD  Coralyn Helling, MD  [facility]  Stefani Dama, M.D.

## 2010-08-24 NOTE — Op Note (Signed)
NAMENOTNAMED, SCHOLZ                ACCOUNT NO.:  [id number]   MEDICAL RECORD NO.:  [id number]          PATIENT TYPE:  INP   LOCATION:  3533                         FACILITY:  [facility]   PHYSICIAN:  Danae Orleans. Venetia Maxon, M.D.  DATE OF BIRTH:  05-31-1929   DATE OF PROCEDURE:  11/16/2007  DATE OF DISCHARGE:  11/16/2007                               OPERATIVE REPORT   PREPROCEDURE DIAGNOSIS:  T10, L2, and L4 compression fractures with  intractable back pain, on chronic prednisone with history of  osteoporosis.   FINAL DIAGNOSIS:  T10, L2, and L4 compression fractures with intractable  back pain, on chronic prednisone with history of osteoporosis.   PROCEDURES:  T10, L2, and L4 kyphoplasty.   SURGEON:  Danae Orleans. Venetia Maxon, MD   ANESTHESIA:  General endotracheal anesthesia.   ESTIMATED BLOOD LOSS:  Minimal.   COMPLICATIONS:  None.   DISPOSITION:  To recovery.   INDICATIONS:  Dimitriy Carreras is a 77 year old man on chronic prednisone  with severe osteoporosis.  He has undergone three prior kyphoplasty  procedures for multiple vertebral compression fractures.  He comes in  today with new fractures at T10, L2, and L4 and severe intractable pain.  It was elected to take him to the surgery for kyphoplasty to these  affected levels.   PROCEDURE:  Mr. Nguyen was brought to the operating room.  Following  satisfactory and uncomplicated induction of general endotracheal  anesthesia placement, the patient was turned in a prone position on  chest and pelvic rolls.  Using the intrapedicular approaches initially  T10 on the right and subsequently after repositioning biplane C-arm  fluoroscopy performing at L2 and L4 levels, L2 from the left, L4 from  the right, inflatable bone tamps were inserted appropriately with good  filling in the vertebrae without high pressures or egress against the  endplates.  There was approximately 7.5 mL of bone cement was placed in  the T10 fracture and 10 mL were placed in  both the L2 and L4 fractures.  Good filling throughout the vertebrae and along the anterior two-thirds  and from  side-to-side.  There was no evidence of any extravasation at any level.  The introducers were removed and the single 3-0 Vicryl stitches were  placed at the exit sites and Dermabond was used for skin dressing.  The  patient was taken to recovery having tolerated the procedure well.      Danae Orleans. Venetia Maxon, M.D.  Electronically Signed     JDS/MEDQ  D:  11/16/2007  T:  11/17/2007  Job:  [id number]

## 2010-08-24 NOTE — Discharge Summary (Signed)
[name], [name]                ACCOUNT NO.:  [id number]   MEDICAL RECORD NO.:  [id number]          PATIENT TYPE:  INP   LOCATION:  3303                         FACILITY:  [facility]   PHYSICIAN:  Stefani Dama, M.D.  DATE OF BIRTH:  [birth year]   DATE OF ADMISSION:  05/10/2007  DATE OF DISCHARGE:  05/16/2007                               DISCHARGE SUMMARY   ADMITTING DIAGNOSES:  1. Traumatic, type 2, odontoid fracture.  2. Chronic shortness of breath.   DISCHARGE DIAGNOSES:  1. Type 2 odontoid fracture.  2. Chronic shortness of breath, question cardiac history.   CONDITION ON DISCHARGE:  Improving.   HOSPITAL COURSE:  [name] [name] is a 77 year old individual who on the  day of admission had stumbled while walking into his house, struck the  front of his head and sustained a fracture of C2.  He was seen at  [facility] and transfer here was requested.  He was evaluated in  the emergency department and found to have a type 2 odontoid fracture  with approximately 5-mm of posterior displacement.  The patient was  maintained in a hard cervical collar.  The history obtained from the  patient was that he was suppose to have coronary angiogram the following  day at [facility].  He has been undergoing some workup for recent  fairly substantial onset of shortness of breath and lack of energy.  He  apparently had a cardiologist in [place] evaluate him noninvasively  and requested coronary angiography.  While here at the hospital, we had  him seen by Dr. Tonny Bollman from [facility] because of the  history of dyspnea.  Echocardiogram was performed which demonstrated the  patient had a good left ventricular function and on the basis of that  and EKGs, it was felt that he was an appropriate candidate for surgical  intervention.  This was undertaken on May 14, 2007, and the patient  underwent anterior screw fixation of a type 2 odontoid fracture.  Afterwards, he  still had considerable difficulties with shortness of  breath even in the hospital and consultation with [facility]  service was obtained.  They had suggested some pulmonary function  studies which were minimally abnormal.  They felt that he could be  discharged home.  He was discharged home on May 16, 2007, to undergo  further outpatient followup.  He was ambulatory.  He had good function  in his upper and lower extremities and was neurologically intact.  Incision was clean and dry.  The patient had been given some mild pain  medication in the form of Darvocet as needed for pain control.  He will  be seen in the office in approximately 2 weeks' time for further  followup.      Stefani Dama, M.D.  Electronically Signed     HJE/MEDQ  D:  06/14/2007  T:  06/15/2007  Job:  [id number]

## 2010-08-24 NOTE — Assessment & Plan Note (Signed)
[facility]                            CARDIOLOGY OFFICE NOTE   NAME:[name], [name] [name]                         MRN:          [id number]  DATE:06/21/2007                            DOB:          [birth year]    The patient may be discharged in the next few days and this is the most  current problem list.  1. Nephrolithiasis.  2. SULFA allergy.  3. Trivial aortic insufficiency.  4. Atrial septal aneurysm of no clinical significance.  5. Hyperlipidemia.  6. Status post splenectomy.  7. *Shortness of breath.  The patient has had no pulmonary emboli.  He      probably has muscle weakness from his myositis causing shortness of      breath.  Dr. Delton Coombes is helping Dr. Craige Cotta and will assess these      issues.  The patient will have pulmonary function studies and will      have a high-resolution CT scan.  8. *Left arm swelling.  This continues to be intermittent.  There is      no deep venous thrombosis.  We will follow this over time.  9. Prior fevers?  Gone.  10.Edema resolved. (Except for scrotum).  11.*Myositis.  Sed rate elevated.  Muscle biopsy pending.  The patient      is on prednisone and Dr. Anne Hahn will be the treating doctor.  12.Good LV function.  13.Mild diastolic dysfunction.  14.*Nutrition.  Panda feeding tube is out.  PEG is in.  PEG feeding to      start tomorrow.  He needs the final home visit.  15.History of CLL treated with Rituxan in the past.  16.Status post fall with a fracture to C2 with surgery by Dr. Danielle Dess,      and he wears a soft collar now.  17.*Guaiac-positive stool.  This needs to be assessed and a later      date.  18.Question of coronary artery disease.  There is no proof and there      is no plan for cath.  19.*Scrotal edema.  We will follow this as his nutrition improved.  20.Diarrhea this has completely resolved and was probably related to      his prior feedings.  21.*Prednisone therapy.  Dr. Anne Hahn will oversee this.   When PT and OT feel the patient is strong enough and the PEG feeding is  in place and Dr. Anne Hahn agrees, the patient can go home.  Followup will  be with Dr. Anne Hahn for the treatment of his myositis.  I will help  manage his overall cardiac status.  His other doctors include his  primary M.D., Franky Macho at [facility], Dr. Craige Cotta of  pulmonary, [place], and Dr. Doylene Canning [facility] at [facility].     Luis Abed, MD, [facility]  Electronically Signed   JDK/MedQ  DD: 06/21/2007  DT: 06/22/2007  Job #: [id number]

## 2010-08-24 NOTE — Assessment & Plan Note (Signed)
[facility]                            CARDIOLOGY OFFICE NOTE   NAME:[name], [name]                         MRN:          [id number]  DATE:06/25/2007                            DOB:          18-Jul-1929    HISTORY OF PRESENT ILLNESS:  See my dictation of few days ago.  Mr.  [name] [name] was seen over the weekend and I spoke with Dr. Andee [name] very  carefully on June 24, 2007, and the patient was allowed to be  discharged.   DISCHARGE MEDICATIONS:  1. Lexapro 10.  2. Temazepam 30.  3. Iron three times a day.  4. Potassium 40 mEq daily.  5. Os-Cal.  6. Prednisone 60 mg daily.  7. Nexium.  8. Neosporin ointment.  9. Lasix 80 b.i.d.  10.Nutritional feeding through his PEG tube.   PLAN:  I will call the patient today to further update him concerning  visits back with both me and Dr. Lesia [name].     Luis Abed, MD, [facility]  Electronically Signed    JDK/MedQ  DD: 06/25/2007  DT: 06/25/2007  Job #: [id number]   cc:   Luis Abed, MD, [facility]

## 2010-08-24 NOTE — Discharge Summary (Signed)
[name], [name] [name]                ACCOUNT NO.:  [id number]   MEDICAL RECORD NO.:  [id number]          PATIENT TYPE:  INP   LOCATION:  3303                         FACILITY:  [facility]   PHYSICIAN:  Stefani Dama, M.D.  DATE OF BIRTH:  07-11-1929   DATE OF ADMISSION:  05/10/2007  DATE OF DISCHARGE:  05/16/2007                               DISCHARGE SUMMARY   ADMITTING DIAGNOSIS:  Type 2 odontoid fracture.   DISCHARGE AND FINAL DIAGNOSES:  1. Type 2 odontoid fracture.  2. C1 ring fracture.  3. Shortness of breath of unknown etiology.  4. Peripheral edema secondary to mild congestive failure.   HOSPITAL COURSE:  [name] [name] is a 77 year old individual who sustained  a type 2 odontoid fracture when he fell forward into a wall striking his  head.  He was found to have a ring C1 fracture.  The fracture was  displaced posteriorly with the odontoid PEG clinging to the spinal  canal.  It was advised that the patient should undergo surgical  treatment for this.  During the initial history it was noted that the  patient had for the past month been having increasing shortness of  breath and easy fatigability.  He was being worked up by his primary  care physician and the next day, prior to his accident, he was to have  cardiac catheterization.  Before we cleared him for surgery he was seen  by the [facility] Department.  A VQ scan was performed which was  low probability for pulmonary embolus and the patient had an  echocardiogram performed which demonstrated that he had a septal atrial  aneurysm.  It was felt that he was clear for general anesthesia as he  had an ejection fraction of 65%.  The patient was taken to the operating  room on the 2nd of February where he underwent odontoid screw fixation  which gave a satisfactory result.  During his postoperative period the  patient was noted to have substantial difficulty with shortness of  breath with minimal exertion and he also had  peripheral edema.  A  Pulmonary consult from [facility] was obtained.  A number  of tests were ordered and these included a sedimentation rate and a CRP,  both of which were elevated.  Rheumatoid factor was negative, ANA is  pending.  The patient is to have further pulmonary testing as an  outpatient.  The patient now will be staying with his daughters in  [place] during this postoperative period and they desired to have his  cardiology followup here in [place] if at all possible.  The patient  has been given the contact numbers for [facility] and [facility] had seen him during this hospitalization and they will arrange  outpatient followup for both those aspects.  At the time of discharge  the patient did have 2+ pitting edema in the ankles.  He was given a 5-  day prescription for hydrochlorothiazide 25 mg a day with 20 mEq of  potassium supplementation.  He was also given a prescription for  hydrocodone, #40, without  refills for pain.  The  patient will be seen and followed by me in 3 weeks' time for his  cervical spine and they have been given the contact numbers for Dr.  Excell Seltzer and Dr. Myrtis Ser from [facility] and Dr. Coralyn Helling who had  seen him from Pulmonary Medicine.      Stefani Dama, M.D.  Electronically Signed     HJE/MEDQ  D:  05/16/2007  T:  05/17/2007  Job:  [id number]   cc:   Coralyn Helling, MD  Veverly Fells. Excell Seltzer, MD  Luis Abed, MD, [facility]

## 2010-08-24 NOTE — Assessment & Plan Note (Signed)
[facility]                            CARDIOLOGY OFFICE NOTE   NAME:POWELLTevan, Marian                         MRN:          [id number]  DATE:10/09/2007                            DOB:          [birth year]    [name] [name] looks great today.  I saw him last on August 08, 2007.  He has  done very well.  He is now on Imuran.  His prednisone dose has now been  decreased for the first time from 60 to 50.  His volume status has been  stable.  We had cut his Lasix back to 80 mg daily.  The question now is  whether or not we can decrease it further.  I am hesitant because of his  steroid dosing.  We will decide this soon after we recheck a BMET.   His muscle strength is greatly improved.  His swallowing is quite good.  He is to have a followup swallowing test.  He has not had any chest  pain.  There has been no syncope or presyncope.  He is going about good  activities.  I am very happy with his overall progress.  He is here  today with one of his daughters.  We felt that he had some diastolic  dysfunction.  Of course, his major problem was his myositis.   ALLERGIES:  SULFA.  Question of CEPHALEXIN.   MEDICATIONS:  1. Os-Cal.  2. Iron.  3. Nexium.  4. Furosemide 80 once daily.  5. Lexapro.  6. Potassium.  7. Protein 4 scoops daily.  8. Temazepam.  9. Imuran 50 b.i.d.  10.Prednisone 50 once daily.  11.B12.   OTHER MEDICAL PROBLEMS:  See the list below.   REVIEW OF SYSTEMS:  He is doing very well.  His review of systems is  negative other than the HPI.   PHYSICAL EXAMINATION:  Blood pressure is 120/65 with a pulse of 68.  The patient is oriented to person, time, and place.  Affect is normal.  His skin is mildly reddened from the sun.  He is here with his daughter.  HEENT:  No xanthelasma.  He has normal extraocular motion.  There are no carotid bruits.  There is no jugular venous distention.  LUNGS:  Clear.  Respiratory effort is not labored.  There is  question of  a few rales on lung exam.  CARDIAC:  S1 and S2.  ABDOMEN:  Soft.  There are no masses or bruits.  He has no significant peripheral edema.   PROBLEMS:  1. Nephrolithiasis.  2. Sulfa and cephalexin allergies.  3. Trivial aortic insufficiency.  4. Atrial septal aneurysm of no clinical significance.  5. Hyperlipidemia.  We still have to consider how to approach this.  I      am hesitant to push meds at this point.  6. Status post splenectomy in the past.  7. Shortness of breath.  Ultimately, the full workup showed that he      did not appear to have a primary pulmonary disease, although he had      pulmonary difficulties  related to muscle weakness from his      myositis.  8. Left arm swelling.  This disappeared eventually and he had no deep      vein thrombosis and it has gone.  9. Edema.  This is stable on his current dosing of Lasix.  10.Myositis.  This is aggressively treated by Dr. Anne Hahn and is      improving with the meds as outlined above.  11.Good systolic left ventricular function.  12.Mild diastolic dysfunction.  13.Nutrition with a PEG.  He may be able to get this out soon.  14.History of chronic lymphocytic leukemia, treated with Rituxan in      the past.  15.Status post falling injury with fractures of C2 early this year,      treated by Dr. Danielle Dess.  This has improved.  16.Guaiac-positive stool.  This can be followed by GI team over time.  17.Question of coronary artery disease.  We decided that he was stable      and cath was never done.  18.History of scrotal edema.  This improved when his albumin improved.  19.Diarrhea.  This was associated with several feeding regimens and      this has improved.  20.Imuran and prednisone therapy.   As of today, I want to keep him on his Lasix 80.  I explained that the  prednisone would play a role in keeping volume on board.  Also, he may  have had a few rales.  The patient will have a BMET checked today.  If  this  leads me towards changing his diuretics, we will do so.  We will  also check an albumin level.  We will then be in touch with the patient  and his daughter about  whether he can decrease his Lasix to 40 and watch his volume status or  whether he should stay on the 80.     Luis Abed, MD, [facility]  Electronically Signed    JDK/MedQ  DD: 10/09/2007  DT: 10/10/2007  Job #: [id number]   cc:   Marlan Palau, M.D.  Coralyn Helling, MD  Seth Bake, M.D.  Venita Lick. Russella Dar, MD, Gilman Buttner, MD

## 2010-08-24 NOTE — Assessment & Plan Note (Signed)
[facility]                            CARDIOLOGY OFFICE NOTE   NAME:POWELLHillery, Bhalla                         MRN:          [id number]  DATE:01/14/2008                            DOB:          09/17/1929    HISTORY OF PRESENT ILLNESS:  Mr. [name] [name] is here today for the followup  of his shortness of breath, left arm swelling, edema, and diastolic  dysfunction.  In addition, he has a new problem with some EKG changes  and this is discussed below.   Mr. 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  Recently, he had to have his prednisone dose increased after trying to  decrease it.  He had more muscle weakness and the dose had to be  increased.  His shortness of breath does not appear to be cardiac in  origin.  Concerning his left arm swelling, this had occurred during his  illness previously and has not returned and no other workup needs.  Concerning edema, he has some mild edema today.  He takes 80 of Lasix  daily.  He knows how to adjust this.  His Lasix as needed for a  combination of diastolic dysfunction and because of ongoing prednisone  use.  The other problem is diastolic dysfunction.  This is known to [place]  and stabilized through his medications including volume treatment.   PAST MEDICAL HISTORY:   ALLERGIES:  SULFA and CEPHALEXIN.   MEDICATIONS:  1. B12.  2. Prednisone 40.  3. Potassium 40 b.i.d.  4. Multivitamins.  5. Flaxseed oil.  6. Fish oil.  7. Zometa.  8. Os-Cal.  9. Iron.  10.Nexium.  11.Furosemide 80.  12.Lexapro.  13.Protein food supplements.  14.Temazepam.  15.One other medication is Imuran.   OTHER MEDICAL PROBLEMS AND PAST MEDICAL HISTORY:  See the extensive list  on my note of October 09, 2007.   REVIEW OF SYSTEMS:  Unfortunately, he is having significant problems  from compression fractures in his back related to his prednisone  therapy.  He has discomfort from this.  He is not having any GI or GU  symptoms.  He has not had any fevers.  Otherwise, his review of systems  is negative.   PHYSICAL EXAMINATION:  GENERAL:  The patient is here with his daughter  today.  The patient is oriented to person, time and place.  Affect is  normal.  VITAL SIGNS:  Blood pressure is 140/70 with a pulse of 68.  Weight is  142 pounds, which is up a few pounds.  HEENT:  Reveals no xanthelasma.  He has normal extraocular motion.  NECK:  There are no carotid bruits.  There is no jugular venous  distention.  LUNGS:  Clear.  Respiratory effort is not labored.  CARDIAC:  Reveals S1 with an S2.  There are no clicks or significant  murmur.  BACK:  He has kyphosis.  ABDOMEN:  Soft.  EXTREMITIES:  He does have 1+ peripheral edema today.  He says this has  been varying over the past few days.   LABORATORY DATA:  Review of recent labs showed that his liver function  studies have been abnormal and this is known to Dr. Anne Hahn.  On this  basis his Imuran was adjusted and his LFTs are proved.  His EKG shows a  decreased anterior R-wave progression.  I do not know if this is related  to lead placement or not.   PROBLEM LIST:  The complete problem list is on the note of October 09, 2007,  #3.  Trivial aortic insufficiency.  He will have a 2-D echo to reassess  left ventricular function and we will look at it aortic insufficiency at  that time  #6.  Hyperlipidemia.  He is not on the statins.  With his LFT problems,  we certainly do not want to add any other medicines now.  #7.  Shortness of breath.  We have never proven cardiac disease.  He has  never had major pulmonary problems other than hypoventilation related to  muscle weakness from his myositis.  No other workup at this time.  #8.  Left arm swelling, stable.  No further workup.  #9.  Edema.  He adjusted his diuretic dose as needed and there is no  change.  This is on the basis of  diastolic dysfunction and prednisone  therapy.  #12.  Mild diastolic dysfunction.  See the discussion above.  #21.  Elevated LFTs related to Imuran and the dose is being adjusted  through Dr. Lesia Sago.  #22.  New problem, change in the anterior R-wave progression.  We will  obtain a 2-D echo to  assess his left ventricular function.  Otherwise no further workup until  I see his data.  I will see him back for followup and we will do his  echoes at same day at his followup visit.     Luis Abed, MD, [facility]  Electronically Signed    JDK/MedQ  DD: 01/14/2008  DT: 01/15/2008  Job #: [id number]   cc:   Marlan Palau, M.D.  Coralyn Helling, MD  Seth Bake, M.D.  Venita Lick. Russella Dar, MD, Gilman Buttner, MD

## 2010-08-24 NOTE — Consult Note (Signed)
[name] [name], [name] [name]                ACCOUNT NO.:  [id number]   MEDICAL RECORD NO.:  [id number]          PATIENT TYPE:  INP   LOCATION:  1825                         FACILITY:  [facility]   PHYSICIAN:  Veverly Fells. Excell Seltzer, MD  DATE OF BIRTH:  [birth year]   DATE OF CONSULTATION:  05/10/2007  DATE OF DISCHARGE:                                 CONSULTATION   REASON FOR CONSULTATION:  Preoperative cardiac evaluation, dyspnea.   Mr. Fawver is a 77 year old gentleman who sustained a cervical spine  fracture earlier today. He was walking back from his mailbox and was  going up the stairs when he tripped and fell forward hitting the top of  his head. He sustained an odontoid fracture and will require screw  fixation.   Mr. Ryland medical situation is complicated by progressive dyspnea  over the past 1 month. He has chronic lymphocytic leukemia but has  otherwise been very healthy. His family tells me that he has been in  remission for the last few years. He has previously been a very active  gentleman who does yard work and is able to chop wood. Over the past 6  weeks, he has developed progressive dyspnea. He has been undergoing an  outpatient evaluation in [place]. He also complains of diffuse joint  pains that have arisen over the time period. He denies any edema,  orthopnea, PND or cough. His evaluation has included lower extremity  venous Dopplers that show no evidence of DVT. A CT angiogram of the  chest was performed that apparently showed a small segmental pulmonary  embolus. He has had an echocardiogram just done yesterday and I do not  have the results of that at this time. It has been suggested that he  start on anticoagulation but he has not started yet. He was scheduled  for a cardiac catheterization tomorrow by Dr. Gwen Pounds at the [facility].   PAST MEDICAL HISTORY:  1. Chronic lymphocytic anemia.  2. Splenectomy.  3. Hyperlipidemia.  4. Remote hernia repair.  5.  Nephrolithiasis.  6. Anemia.   FAMILY HISTORY:  There is no history of cardiac disease in the entire  family.   SOCIAL HISTORY:  The patient is retired. He worked for [employer]. He is a  remote cigarette smoker but has not smoked in over 30 years. He does not  drink alcohol.   MEDICATIONS:  Lexapro and temazepam.   ALLERGIES:  SULFA causes rash.   REVIEW OF SYSTEMS:  A complete 12-point review of systems was performed.  There have been no night sweats or weight loss. Diffuse joint pains as  detailed above and progressive dyspnea. There has been no chest pain or  pressure. No light-headedness, syncope, orthopnea, PND, edema or  palpitations. All other systems were reviewed and are negative except as  detailed above.   PHYSICAL EXAMINATION:  The patient is alert and oriented. He is in no  acute distress. He has a C collar in place.  Heart rate 72, blood pressure 132/72, temperature 97.7, respiratory rate  20.  HEENT:  Normal.  NECK:  Unable to evaluate  due to a C collar.  LUNGS:  Clear to auscultation bilaterally.  CARDIOVASCULAR:  The apex is dynamic. The heart is regular rate and  rhythm without murmurs or gallops. There is no right ventricular heave  or lift.  ABDOMEN:  Soft, nontender, no organomegaly. There is a soft abdominal  bruit.  BACK:  There is no CVA tenderness.  EXTREMITIES:  No clubbing, cyanosis or edema. Peripheral pulses are 2+  and equal throughout.  SKIN:  Warm and dry without rash.  LYMPHATICS:  There is no adenopathy.  NEUROLOGIC:  Cranial nerves II-XII are intact. Strength is 5/5 and equal  in the arms and legs.   EKG shows normal sinus rhythm and is within normal limits with the  exception of low voltage QRS.   Lab work is currently pending.   ASSESSMENT:  1. Progressive dyspnea.  2. Subsegmental pulmonary embolus.  3. Acute C spine fracture.   DISCUSSION:  Mr. Cavins has progressive dyspnea of uncertain etiology.  His situation is complicated by  the need for surgery to treat his neck  fracture. We really need to get the primary data that includes a CT  angiogram of the chest that was recently done. I think the diagnosis of  pulmonary embolism is critical here because he has a clear  contraindication to anticoagulation with his neck fracture. I am going  to check a VQ scan to look for chronic thromboembolic diseases as this  is a more sensitive way to detect this problem. Also will repeat a 2-D  echocardiogram to evaluate his cardiac status. It is certainly possible  that he has structural heart disease and this may be playing a role in  his progressive angina. After reviewing his CT angiogram from [facility]  as well as his VQ scan and echo results, I will determine whether  proceeding with cardiac catheterization prior to surgery. I think the  chance that his symptoms are coming from severe coronary artery disease  are relatively low but cannot be excluded at this point and it certainly  is a reasonable plan as Dr. Gwen Pounds as arranged.   Mr. Okray may also have a systemic inflammatory process as he complains  of joint pains and myalgias in addition to progressive dyspnea. Will  check a baseline sed rate. We may need involve pulmonary medicine for  further opinion if the etiology of his dyspnea is not clear based on the  studies as noted.   Further disposition and surgical clearance pending the results of the  above tests.      Veverly Fells. Excell Seltzer, MD  Electronically Signed     MDC/MEDQ  D:  05/10/2007  T:  05/10/2007  Job:  [id number]   cc:   Elizabeth Sauer, MD  Arnoldo Hooker, MD  Stefani Dama, M.D.

## 2010-08-24 NOTE — Op Note (Signed)
NAME:  [name] [name], [name] [name]                ACCOUNT NO.:  [id number]   MEDICAL RECORD NO.:  [id number]          PATIENT TYPE:  OIB   LOCATION:  3533                         FACILITY:  [facility]   PHYSICIAN:  Stefani Dama, M.D.  DATE OF BIRTH:  1929/05/31   DATE OF PROCEDURE:  DATE OF DISCHARGE:                               OPERATIVE REPORT   PREOPERATIVE DIAGNOSIS:  T6, T7, and T8 osteoporotic compression  fractures.   POSTOPERATIVE DIAGNOSIS:  T6, T7, and T8 osteoporotic compression  fractures.   PROCEDURE:  Acrylic balloon kyphoplasty T6, T7, and T8.   SURGEON:  Stefani Dama, MD   ANESTHESIA:  General.   INDICATIONS:  [name] is an unfortunate 77 year old man who has  had polymyositis with severe weakness of his all four extremities.  He  was treated with high-dose steroids and developed severe osteoporosis.  He has had multiple compression fractures.  He now has evidence of  fractures at T6, T7, and T8.  T8 was seen in the office, but on  evaluation here in the hospital, he now has fractures at T6 and T7 also.  He was taken to the operating room to undergo acrylic balloon  kyphoplasty.   PROCEDURE:  The patient was brought to the operating room supine on the  stretcher.  After smooth induction of general endotracheal anesthesia,  he was turned prone.  Back was prepped with alcohol and DuraPrep and  draped in sterile fashion.  The fluoroscopic guidance was used to  localize the left-sided pedicle of T6, T7, and T8.  Skin lateral to this  was infiltrated with lidocaine 1% for total of 3 mL.  A Jamshidi needle  was then inserted transcutaneously to the vertebral body at the level of  the lateral aspect of the pedicle.  The center of the cannula was  removed and a bone biopsy needle was passed in T6, T7, and T8.  T6  yielded no bone, but T7 and T8 yielded small cores of bone which were  sent as a group specimen.   Then the balloons were inserted into the cavity created by  the bone  biopsy needle, deflated to 100 mmHg and rapidly deteriorated to about 70  mmHg total of about 2 mL of dye was inserted into each of the vertebrae.  Each of the vertebra was noted at the balloon inflation crossed the  midline.  Then kyphoplasty cement was allowed to mix and harden to  appropriate consistency for injection.  It  was injected first into T8,  where there was good filling both across the midline ventrally and  laterally until a small tail was noted to appear superiorly in the  vertebral body.  An injection was stopped here a total of 3 mL.  T6 and  T7 were injected similarly and again a total of 3 mL of the contrast of  cement was injected into each of the vertebrae.  At this point, it was  decided to stop the procedure.  The  center cannulas were gradually withdrawn.  The aortic cannulas were then  withdrawn.  No  tails were noted.  The skin was closed with interrupted  Vicryl.  The patient tolerated the procedure well.  She was returned to  the recovery room in stable condition.      Stefani Dama, M.D.  Electronically Signed     HJE/MEDQ  D:  01/28/2008  T:  01/29/2008  Job:  [id number]

## 2010-08-24 NOTE — Consult Note (Signed)
[name] [name], VEST                ACCOUNT NO.:  [id number]   MEDICAL RECORD NO.:  [id number]          PATIENT TYPE:  INP   LOCATION:  2922                         FACILITY:  [facility]   PHYSICIAN:  Kalman Shan, MD   DATE OF BIRTH:  Jun 18, 1929   DATE OF CONSULTATION:  06/11/2007  DATE OF DISCHARGE:                                 CONSULTATION   REFERRING PHYSICIAN:  Dr. Willa Rough.   REASON FOR CONSULTATION:  Dyspnea.   HISTORY OF PRESENT ILLNESS:  This is a very pleasant 77 year old male  patient who reports to the hospital on June 08, 2007 with chief  complaint of progressive deconditioning and anasarca.  The patient has  been seen by Dr. Coralyn Helling in the past with complaints of progressive  dyspnea felt to be initially noted some time around December of 2008.  Prior to that time, he was very active, able to run a chainsaw, do  several different activities.  However, developed some degree of  progressive dyspnea for which an etiology was never identified.  Sometime in January, [name] [name] was outside, sustained a fall in which  he received a second odontoid fracture.  Following that, his general  health has been complicated by progressive weakness and dysphagia.  He  has a underlying history of diastolic heart disease, which has primarily  been treated in the outpatient setting with diuresis, additionally has  probable undiagnosed chronic obstructive pulmonary disease.  He was  admitted on the 27th for progressive weakness and electrolyte  abnormality with increased lower extremity and upper extremity edema.  At baseline, he reports he is able to get up to the chair, walk short  distances in the room before shortness of breath, and has been at this  level from a dyspnea standpoint essentially since his C-spine surgery in  mid February.  After further evaluation, he is about the same as since  time of his surgery; however, he is progressively more weak.  He denies  cough  except with an occasional cough with clear liquids.  He has been  compliant with n.p.o. status for the most part.  He does report an  occasional fever in the night time hours.  He denies wheeze, chest  discomfort, nasal congestion, has had some phlegm production but overall  no acute changes in pulmonary complaints.  Overall, his biggest  complaint has been profound increase in weakness and continued swelling  in spite of diuresis.  Again, at point of consultation, it appears that  [name] [name]'s primary complaint is progressive weakness over dyspnea.  However, no doubt his degree of dyspnea is abnormal, and an answer to  the reason for his dyspnea prior to this C-spine surgery remains  unclear.   PAST MEDICAL HISTORY:  1. CLL status post Rituxan in 2006.  2. Hyperlipidemia.  3. Nephrolithiasis.  4. Anemia.  5. Splenectomy.  6. Herniorrhaphy.  7. Chronic dyspnea.  8. Diastolic dysfunction.  9. C-spine surgery in February 2009, which would include the second      odontoid fracture, and dyspnea identified approximately one week  postoperatively at home.  He has been n.p.o. for approximately 2      weeks prior to admission at this point with an NG tube which was      placed by interventional radiology at [facility] after      evaluation by ear, nose, throat surgery.   SOCIAL HISTORY:  Retired from [employer], stopped smoking 30 years ago, did  smoke approximately half pack a day since age 23.  Denies alcohol  consumption.  Activity tolerance as noted above.   FAMILY HISTORY:  Significant for asthma, emphysema, breast cancer, and  prostate cancer.   ALLERGIES:  SULFA.   MEDICATION:  List as follows:  1. Ancef.  2. Restoril.  3. Lexapro.  4. Lasix 40 mg IV b.i.d.  5. KCl.  6. Ferrous sulfate.  7. Osmolite tube feedings.  8. Ditropan,  9. P.r.n. Roxicodone.   REVIEW OF SYSTEMS:  As noted per HPI.   PHYSICAL EXAMINATION:  Temperature 98.8, heart rate 72, blood  pressure  115/43, respirations 16-18, saturations 94-96%.  In general, this is a 77 year old, frail, male patient status post C-  spine surgery currently in no acute distress.  HEENT:  He is normocephalic.  His mucous membranes are moist.  He has no  palpable adenopathy.  His neck is immobilized in a C-collar at this  point.  PULMONARY:  Notable for faint basilar crackles bilaterally, no accessory  muscle use.  CARDIAC:  2/6 systolic murmur on auscultation, normal sinus on cardiac  monitor.  EXTREMITIES:  Notable for diffuse anasarca, 2+ pulses, brisk capillary  refill.  ABDOMEN:  Soft and nontender.  GENITOURINARY:  Clear yellow urine.  NEUROLOGICALLY:  Alert and oriented; however, does have diffuse weakness  noted.   LABORATORY DATA:  Prior admission demonstrated inflammatory autoimmune  workup negative with ANA negative and C-reactive protein negative.  Date  June 10, 2000, albumin 2.2, AST 88, ALT 25.  Hemoglobin 10.5, hematocrit  30.7, white blood cell count 11.4, platelet count 315.   IMPRESSION AND PLAN:  Dyspnea of uncertain etiology.  Prior  hospitalization and evaluation by Dr. Coralyn Helling demonstrates VQ scan  which was negative.  CT scan has been negative now twice which  essentially rules out pulmonary emboli and both acute and chronic emboli  as potential etiology.  No doubt, dyspnea is complicated by some degree  of chronic obstructive pulmonary disease.   Would also worry about diaphragmatic injury following C-spine injury and  suspect significant deconditioning is of the greatest concern in this  setting which remains of unclear etiology.  Pre-fracture dyspnea answer  still seems to be unclear.  It was thought by Dr. Coralyn Helling to be  secondary to some sort of neuromuscular etiology as autoimmune process  was ruled out from a laboratory standpoint.  Again, diastolic  dysfunction and chronic obstructive pulmonary disease no doubt played  some role.  However,  dyspnea seems be out of proportion to COPD.   Upon time of current evaluation, his chest x-ray and CT scan are free of  infiltrates, and any sort of structural or anatomical potential cause  for complaints of dyspnea.  .  We will obtain spirometry, check an  arterial blood gas, get MIP/MEP as well as an arterial blood gas on room  air.  We will also send him to the spirometry lab for PFTs although  unsure of how reliable these will be as he has an NG tube and C-collar  in place.   Agree  fully with neurological evaluation and cardiac evaluation and  maintenance as outlined.  Again, at this time, there is no clear  pulmonary edema on x-ray, and no acute pulmonary complaints at time of  evaluation; however chronic complaint and reason for dyspnea remains  unclear.   Thank you for the privilege of assisting you.      Zenia Resides, NP      Kalman Shan, MD  Electronically Signed    PB/MEDQ  D:  06/11/2007  T:  06/11/2007  Job:  [id number]

## 2010-08-24 NOTE — Op Note (Signed)
[name] [name], [name] [name]                ACCOUNT NO.:  [id number]   MEDICAL RECORD NO.:  [id number]          PATIENT TYPE:  INP   LOCATION:  2922                         FACILITY:  [facility]   PHYSICIAN:  Gabrielle Dare. Janee Morn, M.D.DATE OF BIRTH:  05-Nov-1929   DATE OF PROCEDURE:  06/15/2007  DATE OF DISCHARGE:                               OPERATIVE REPORT   PREOPERATIVE DIAGNOSIS:  Proximal muscle weakness.   POSTOPERATIVE DIAGNOSIS:  Proximal muscle weakness.   PROCEDURE:  Right deltoid muscle biopsy x3.   SURGEON:  Gabrielle Dare. Janee Morn, MD   HISTORY OF PRESENT ILLNESS:  [name] is a 77 year old gentleman who  is admitted with shortness of breath and progressive proximal muscle  weakness.  He underwent EMG studies today which have reportedly ruled  out myasthenia gravis.  We are asked to obtain right deltoid muscle  biopsy to look for myositis.   PROCEDURE IN DETAIL:  Informed consent was obtained after long  discussion with the patient's family and the patient.  He is brought to  the preop holding area.  He received intravenous antibiotics.  His site  was marked.  He was brought to the operating room and general anesthesia  with laryngeal mask airway was administered with care to protect his  cervical spine.  The cervical spine collar remained intact.  His right  arm was prepped and draped in a sterile fashion.  Lidocaine 1% with  epinephrine was injected over his deltoid muscle.  Longitudinal incision  was made.  Subcutaneous tissues were dissected down using Bovie cautery  to get good hemostasis.  This revealed the deltoid muscle fascia.  This  was divided.  Three segments of deltoid muscle were then taken using  Bovie cautery, each 2 cm long by 0.5 cm wide.  Each was sewn at native  length onto tongue depressors as per the special pathology instructions  and sent fresh.  The wound was copiously irrigated.  Some additional  local anesthetic was injected.  Meticulous hemostasis was  ensured.  The  fascia was closed with a running 3-0 Vicryl suture.  Subcutaneous  tissues were irrigated and the skin was closed with a running 4-0  Monocryl subcuticular stitch.  Dermabond was then placed on the wound.  The patient tolerated the procedure well without apparent complications.  Sponge,  needle and instrument counts were all correct.   He was then taken to the recovery room in stable condition.      Gabrielle Dare Janee Morn, M.D.  Electronically Signed     BET/MEDQ  D:  06/15/2007  T:  06/15/2007  Job:  [id number]   cc:   Bevelyn Buckles. Bensimhon, MD  C. Lesia Sago, M.D.

## 2010-08-24 NOTE — H&P (Signed)
[name], [name]                ACCOUNT NO.:  [id number]   MEDICAL RECORD NO.:  [id number]          PATIENT TYPE:  INP   LOCATION:  3303                         FACILITY:  [facility]   PHYSICIAN:  Stefani Dama, M.D.  DATE OF BIRTH:  1930/03/12   DATE OF ADMISSION:  05/10/2007  DATE OF DISCHARGE:                              HISTORY & PHYSICAL   ADMISSION DIAGNOSIS:  C1 arch fracture and type 2 odontoid fracture.   HISTORY OF PRESENT ILLNESS:  Mr. [name] [name] is a 77 year old right-  handed male who sustained a fall into a wall this morning.  He had  apparently hit the top of his head as he fell forward.  He complained of  neck pain and was taken to [facility] where a CT scan of the C-  spine was performed. This demonstrated a posterior arch bilaterally of  the C1 vertebrae and a type 2 odontoid type fracture.  He had  significant neck pain.  He was placed in a hard cervical collar, placed  on a spine board and I was called for consultation and I advised that  the patient be transferred to the emergency department at [facility].  While here the patient has had no significant complaints of  any neuro defects.  He was mobilized off of the spine board.  His  radiographs were reviewed and the findings were as noted above.   PAST MEDICAL HISTORY:  Reveals that the patient had been undergoing  workup for shortness of breath which has been coming upon him over the  past month's time.  He is to have a cardiac catheterization tomorrow.  The patient gives a history of chronic lymphocytic leukemia which is  being observed.   CURRENT MEDICATIONS:  1. Lexapro 10 mg a day.  2. Temazepam 30 mg at bedtime.   ALLERGIES:  THE PATIENT HAS NO KNOWN ALLERGIES.   He has a history of arthritis of several joints including his knees.   PHYSICAL EXAMINATION:  At the current time reveals the patient is alert  and oriented.  His pupils are 3 mm equal, brisk reactive to light and  accommodation.  Extraocular movements are full.  Face is symmetric to  grimace.  Tongue and uvula are in the midline.  Sclerae and conjunctivae  are clear.  Motor function is symmetric in the upper extremities with  good function in the deltoids, biceps, triceps, grips and intrinsics.  Lower extremity function is good in the iliopsoas, quad, tibialis,  anterior and gastrocs.  Dep tendon reflexes are 2+ and symmetric in the biceps and triceps, 2+  in the patellae and 1+ in the Achilles.  Babinski's are downgoing.   IMPRESSION:  The patient has a bilateral C1 arch fracture.  He also has  a type 2 odontoid fracture with posterior displacement of the PEG.   PLAN:  At the current time the patient should be placed in a hard  cervical collar. He is likely to need odontoid screw fixation requiring  approximately one hour of general anesthesia.  I have contacted the  cardiology department  of [facility] to see and evaluate the  patient so they can do whatever further workup may be necessary to  see if the patient can have general anesthesia safely.  In the meantime we will maintain him in a hard cervical collar and  gradually allow him to sit upright. I will obtain a plain x-ray in the  morning to check the stability of his type 2 odontoid fracture.      Stefani Dama, M.D.  Electronically Signed     HJE/MEDQ  D:  05/10/2007  T:  05/11/2007  Job:  [id number]

## 2010-08-24 NOTE — Discharge Summary (Signed)
[name] [name], [name] [name]                ACCOUNT NO.:  [id number]   MEDICAL RECORD NO.:  [id number]          PATIENT TYPE:  INP   LOCATION:  4713                         FACILITY:  [facility]   PHYSICIAN:  Learta Codding, MD,FACC DATE OF BIRTH:  [birth year]   DATE OF ADMISSION:  06/08/2007  DATE OF DISCHARGE:  06/24/2007                               DISCHARGE SUMMARY   PROCEDURES:  1. Right deltoid muscle biopsy x3.  2. CT of the chest without contrast.  3. Swallowing function study with fluoroscopy.  4. CT angiogram of the chest.  5. Bilateral hip and pelvis x-ray, 3-view.   TIME AT DISCHARGE:  Seventy-eight minutes.   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   SECONDARY DIAGNOSES:  1. Status post fall with a fracture of the odontoid process of C2      treated by Dr. Danielle Dess.  2. History of guaiac-positive stool, evaluate by primary care once he      has recovered from the current illness.  3. Nephrolithiasis.  4. Sulfa allergy.  5. Trivial aortic insufficiency.  6. Atrial septal aneurysm with no clinical significance.  7. Hyperlipidemia.  8. Status post splenectomy.  9. Dyspnea, felt secondary to restrictive lung disease and muscle      weakness.  10.Left arm edema, intermittent.  11.History of fevers with no obvious source of infection.  12.Preserved left ventricular function.  13.Chronic diastolic congestive heart failure.  14.Dysphagia with a percutaneous endoscopic gastrostomy tube and tube      feedings.  15.Chronic lymphocytic leukemia, treated in the past with Rituxan.  16.History of diarrhea related to prior tube feedings.  17.Ongoing prednisone therapy.  18.Remote history of tobacco use, quit 30 years.  19.Osteoarthritis.  20.Remote history of hernia repair.   HOSPITAL COURSE:  [name] [name] is a 77 year old male with no known history  of coronary  artery disease.  He has been undergoing evaluation for  chronic dyspnea which has included an echocardiogram with no critical  abnormalities, as well as a low-probability V/  Q scan.  He was  discharged from the hospital after surgery for his odontoid fracture on  May 16, 2007.  After that, he was followed closely by Cardiology,  but his shortness of breath worsened and he developed orthopnea as well  as shortness of breath at rest.  There was concern for an upper  respiratory infection, but he did not improve after being prescribed  antibiotics.  It was noted that he was having increasing lower extremity  edema and weight gain; he also had hyponatremia.  He came to the  emergency room, where he was admitted for further evaluation and  treatment.   PROBLEM #1 - DYSPNEA:  A pulmonary consult was called and he was seen by  Dr. Marchelle Gearing.  Dr. Marchelle Gearing reviewed all the data and as part of his  evaluation in the hospital, he had pulmonary function testing and a high-  resolution CT.  These were reviewed by Dr. Delton Coombes, who felt that  the PFTs  were consistent with mild airflow limitations secondary to COPD and mild  restriction from presumed respiratory muscle weakness.  Gas exchange was  good.  He was started on steroids, which improved his condition.  Dr.  Delton Coombes felt that once the myositis was more fully treated, his condition  would improved.   PROBLEM #2 - MUSCLE WEAKNESS:  A neurological consult was called and he  was seen by Dr. Anne Hahn and Dr. Pearlean Brownie.  They recommended a muscle biopsy,  which was performed by Dr. Violeta Gelinas; this was sent out to the [facility] and results are pending at the time of dictation.  He was started  on steroids at 60 mg of prednisone daily.  He began improving on this  dose and he is to be followed closely by Dr. Anne Hahn as an outpatient.   PROBLEM #3 - DYSPHAGIA:  [name] [name] had been noted to have problems with  dysphagia and aspiration prior to  admission.  He had a Panda tube when  he came in the hospital and was already on tube feeds.  As he did not  improve rapidly, a GI consult was called and it was felt that a PEG tube  was the best option; this was inserted on June 21, 2007.  The site is  without significant ecchymosis or hematoma and it appears to be  functioning well.  He does have some issues with constipation and  because of that, a tube feed with fiber is recommended.  Of note, on the  EGD for the PEG placement, gastritis was also noted and because of that,  he is to be on proton pump inhibitor indefinitely and avoid NSAIDs if at  all possible.   PROBLEM #4 - EDEMA:  [name] [name] had intermittent upper extremity edema.  There was no DVT noted and no issues with circulation.  He was also  noted to have lower extremity edema and scrotal edema.  These improved  with Lasix and he was on 80 mg b.i.d. at discharge.  His potassium was  supplemented at 40 mEq a day and his BUN and creatinine were 26/0.56 at  discharge with a GFR greater than 60.  His potassium level was 3.8.   PROBLEM #5 - ANEMIA:  At discharge, his hemoglobin was 9.9 with an  hematocrit of 29.4.  He had an iron profile done which showed an iron  level that was low at 35 and a ferritin level elevated in 466.  He was  started on oral iron supplementation t.i.d. and is to follow up with his  primary care physician.  Additionally, it was reported that he had a  history of heme-positive stools, but no records are available for review  at this time.  He can follow up with his primary care physician for  this.   PROBLEM #6 - POSSIBLE CORONARY ARTERY DISEASE:  At first there was  concern for a possible cardiac origin to his weakness and edema.  He  responded well to diuretics and had no chest pain.  His cardiac enzymes  showed some elevation in the CK-MB at 342/10.8 with the index slightly  elevated at 3.2.  His troponins, however, were all negative.  The CK-MBs   did not show clear crescendo-decrescendo pattern, with the low being 341  and the high being 395.  It was felt that this was not a primary cardiac  elevation in his CK-MB and since he had no chest pain and his EF  was  normal by echocardiogram, no further cardiac workup is indicated at this  time.   PROBLEM #7 - CHRONIC LYMPHOCYTIC LEUKEMIA:  Mr. Colee has a history of  chronic lymphocytic leukemia previously treated with Rituxan.  He had a  CBC with differential shortly before his hospital stay and it showed a  white count of 12,100 with absolute lymphocytes 2.9, which is within  normal limits.  Absolute monocytes were elevated to 2.3, with the upper  limit of normal being 1.  During his hospital stay, his white count  remained fairly stable and was generally between 8 and 12.  No further  workup is indicated at this time.   Mr. Parkey had a difficult hospital course, but once the tentative  diagnosis of myositis was made and he was started on steroids, he began  to improve.  He had some fevers before admission and blood cultures were  performed.  He was started on Ancef temporarily, but when the blood  cultures showed no growth, the antibiotics were discontinued and the  fevers resolved.  He was seen by Physical Therapy and Occupational  Therapy and felt to have significant deconditioning.  Eventually, he was  felt strongly enough for the PEG tube and once he had the PEG tube in  place and tube feedings were started, preparations were made for  discharge.  By June 24, 2007, Mr. Hopfensperger was doing better.  He has  excellent home support and home health equipment has been provided to  him.  He was evaluated by Dr. Andee Lineman and considered stable for discharge  with close outpatient followup.   DISCHARGE INSTRUCTIONS:  1. His activity level is to be increased slowly.  2. He is to continue with tube feeds as directed.  3. He was given heart failure discharge instruction sheets and a       weight book.  4. He is to follow up with Dr. Myrtis Ser and with Dr. Anne Hahn as well as Dr.      Erma Pinto, Dr. Polo Riley, Dr. Doylene Canning and Dr. Yetta Barre.   DISCHARGE MEDICATIONS:  1. Lexapro 10 mg a day.  2. Restoril 30 mg nightly.  3. Iron 300 mg t.i.d. via tube.  4. K-Dur 40 mEq in 15 mL daily via tube.  5. Os-Cal plus D b.i.d.  6. Prednisone 20 mg three tablets daily.  7. Nexium 40 mg one packet daily.  8. Neosporin ointment daily.  9. Lasix 80 mg b.i.d.  10.Nutren or Osmolyte 1.5 one can 5 times a day with fiber.  11.Beneprotein powder 1 scoop in 4 ounces of water 4 times a day.      Theodore Demark, PA-C      Learta Codding, MD,FACC  Electronically Signed    RB/MEDQ  D:  06/24/2007  T:  06/25/2007  Job:  [id number]   cc:   Marlan Palau, M.D.  Stefani Dama, M.D.  Earnest Conroy [facility] Elizabeth Sauer MD

## 2010-08-24 NOTE — Consult Note (Signed)
[name] [name], [name] [name]                ACCOUNT NO.:  [id number]   MEDICAL RECORD NO.:  [id number]          PATIENT TYPE:  INP   LOCATION:  2922                         FACILITY:  [facility]   PHYSICIAN:  Pramod P. Pearlean Brownie, MD    DATE OF BIRTH:  [birth year]   DATE OF CONSULTATION:  DATE OF DISCHARGE:                                 CONSULTATION   REFERRING PHYSICIAN:  Bevelyn Buckles. Bensimhon, M.D.   REASON FOR REFERRAL:  Dysphagia.   HISTORY OF PRESENT ILLNESS:  Mr. Londo is a 77 year old, pleasant,  Caucasian male who was admitted on June 08, 2007 with symptoms of  progressive shortness of breath on exertion for the last 3 months.  He  has also been complaining of some weakness of his legs and shoulders,  proximal  muscles as well.  For the last 2-3 weeks he has had some  dysphagia and trouble swallowing as well.  He was found to have  aspiration and has a panda tube now.  The patient denies any drooping of  eyelids, double vision, or ptosis but agreed to mild fatigue.   PAST MEDICAL HISTORY:  1. Significant for recent surgery for a fracture/dislocation of C2      vertebrae with an anterior screw fixation by Dr. Danielle Dess a month      ago.  He states the dysphagia began after the surgery but shortness      of breath and weakness began much before that.  2. Significant for congestive heart failure.  3. Chronic lymphocytic leukemia.  4. Hyperlipidemia.  5. Nephrolithiasis.   HOME MEDICATIONS:  1. Temazepam 30 mg at night.  2. Lexapro 10 mg a day.  3. Potassium 20 mEq daily.  4. Lasix 40 mg a day.  5. Oxycodone as needed.  6. Levaquin 500 mg a day.   MEDICATION ALLERGIES:  SULFA causes rash.   SOCIAL HISTORY:  The patient lives in [place] with his daughter.  He  is retired from [employer].  He quit smoking 32 years ago and does not drink  alcohol.   REVIEW OF SYSTEMS:  Positive for shortness of breath, dyspnea on  exertion, leg weakness, arthralgia, urinary frequency, dysphagia.   PHYSICAL EXAMINATION:  GENERAL:  Reveals a pleasant Caucasian gentleman  alert, pleasant, not in distress.  VITAL SIGNS:  He is afebrile today with temperature 98.8, pulse rate is  72 per minute regular sinus, blood pressure 115/43, respiratory rate 16  per minute, sat is 94% on room air.  HEENT:  Head is nontraumatic.  ENT exam unremarkable except the patient  is in a C collar and hence it is limited.  NECK:  Supple.  There is no bruit.  CARDIAC:  Regular heart sounds.  LUNGS:  Clear to auscultation.  ABDOMEN:  Soft and nontender.  NEUROLOGIC:  The patient is awake, alert, oriented to time, place, and  person.  There is no aphasia, apraxia, dysarthria.  His eye movements  are full range.  There is no nystagmus.  He has no drooping of eyelids  at rest but he has ptosis bilaterally with sustained up-gaze left  more  than right with some subjective diplopia as well.  He can move all 4  extremities well against gravity but he does have some fatigue and  weakness of proximal right and proximal shoulder and hip flexors  bilaterally.  Deep tendon reflexes are 2 plus symmetric.  Plantars are  downgoing.  Sensation is intact.  Coordination is slow but accurate.  Gait was not tested by apparently he was able to walk earlier today as  per the nurse.   DATA REVIEW:  CT angiogram of the chest done this admission shows no  pulmonary embolism and thymoma is not mentioned.   IMPRESSION:  A 77 year old gentleman with subacute progressive shortness  of breath and proximal extremity weakness with some more recent  dysphagia of undetermined etiology, certainly myasthenia gravis is a  consideration given the symptomatology.  The patient was actually  evaluated in the office last week by Dr. Anne Hahn and is currently being  worked up for this diagnosis.   PLAN:  I would recommend checking acetylcholine receptor  antibodies and  rapid repetitive nerve conduction studies for myasthenia.  I would also  check  ESR and ANA panels.  I will review the CT angiogram of the chest  with the radiologist to specifically look for lymphoma, if the study is  not adequate we may need to repeat it.  A trial of Mestinon and steroids  will be discussed with Dr. Anne Hahn.  Kindly call for questions.           ______________________________  Sunny Schlein. Pearlean Brownie, MD     PPS/MEDQ  D:  06/11/2007  T:  06/11/2007  Job:  [id number]

## 2010-08-24 NOTE — Assessment & Plan Note (Signed)
[facility]                         GASTROENTEROLOGY OFFICE NOTE   NAME:POWELLDyon, Rotert                         MRN:          [id number]  DATE:07/04/2007                            DOB:          [birth year]    PROBLEM:  Drainage from PEG tube.   HISTORY:  The patient is a very nice 77 year old white male who was  recently evaluated while he was hospitalized at [facility]  earlier in March and underwent PEG placement with Judie Petit T. Russella Dar, MD,  [facility] on June 21, 2007.  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  This was associated with a neurogenic  dysphagia.  He also has a history of CLL, had been treated previously  with Rituxan, history of prior splenectomy, congestive heart failure and  osteoarthritis.   The patient has been doing well since discharge from the hospital.  He  has been treated with prednisone and his myositis seems to be improving.  He will have follow-up with C. Lesia Sago, M.D. in mid April and at  that time will hopefully be able to be scheduled for a repeat swallowing  evaluation.  He is doing well with his PEG tube feedings.  They have  converted to bolus feedings at home with Osmolite 1.5 which he is  tolerating well.  The patient and his daughter noticed drainage around  his PEG site on Friday, March 20, and he has had persistent drainage  since that time.  His daughter describes it as a ropey mucous.  He seems  to have a bit more drainage associated with feedings.  He is having some  mild soreness around the PEG site, but no significant discomfort.  No  changes in his bowel habits.  No nausea, vomiting, etc., and no fever or  chills.  They have been cleaning the PEG site twice daily with hydrogen  peroxide and then applying Neosporin ointment.   CURRENT MEDICATIONS:  1. Prednisone 20 mg three tablets daily.  2. Os-Cal b.i.d.  3. Iron t.i.d.  4. Nexium 40 mg daily.  5. Furosemide 80 mg daily.  6. Lexapro 10 mg daily.  7. Hydrocodone 7.5 mg p.r.n.  8. Potassium elixir 15 mEq daily.  9. Temazepam 30 mg q.h.s.   ALLERGIES:  SULFA with rash.   PAST MEDICAL HISTORY:  As outlined above.  He is status post splenectomy  in 2002 at which time he was diagnosed with the CLL.  He also has had  previous inguinal hernia repair in 2000.   FAMILY HISTORY:  Positive for prostate cancer in a brother and colon  cancer in a brother.   SOCIAL HISTORY:  The patient is single.  He lives with one of his  daughters.  He is retired from [employer].  He is a nonsmoker and nondrinker.   REVIEW OF SYSTEMS:  GASTROINTESTINAL:  As outlined above.  He has had  some ongoing problems with exertional dyspnea, recent lower extremity  edema, and has had proximal muscle weakness  associated with his  myositis.  Otherwise review of systems is completely negative.   PHYSICAL EXAMINATION:  GENERAL:  Well-developed, thin white male in no  acute distress, able to ambulate without difficulty.  VITAL SIGNS:  Height is 5 feet 8 inches, weight is 133.2, blood pressure  130/60, pulse 76.  HEENT:  Normocephalic and atraumatic.  EOMI.  PERRLA.  Sclerae  anicteric.  NECK:  Supple.  CARDIOVASCULAR:  Regular rate and rhythm with S1 and S2.  ABDOMEN:  Soft and nontender.  Bowel sounds are active.  He was doing a  PEG feeding while I was in the room and is having no difficulty with  function of the PEG tube.  The skin site around the PEG tube is  erythematous but not indurated and there is no evidence of cellulitis.  He has greenish-yellow purulent exudate from the PEG site which is  significant.   IMPRESSION:  1. Local PEG site infection, status post PEG placement, June 21, 2007.  2. 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  3. Other diagnoses as listed above.   PLAN:  1. Keflex oral  suspension 250 mg q.i.d. x14 days.  2. Continue hydrogen peroxide at least twice daily with frequent dry      dressing changes as needed.  3. Follow-up with Dr. Russella Dar in 10-14 days.  The patient and family      were advised that should the site become more inflamed or worsen in      any way to call back and he would need to be seen in the interim.      They were also advised that PEG site infections if unable to be      eliminated at times require removal of the PEG tube.      Mike Gip, PA-C  Electronically Signed      Barbette Hair. Arlyce Dice, MD,FACG  Electronically Signed   AE/MedQ  DD: 07/04/2007  DT: 07/04/2007  Job #: [id number]

## 2010-08-24 NOTE — Op Note (Signed)
[name] [name], [name] [name]                ACCOUNT NO.:  [id number]   MEDICAL RECORD NO.:  [id number]          PATIENT TYPE:  OIB   LOCATION:  3526                         FACILITY:  [facility]   PHYSICIAN:  Stefani Dama, M.D.  DATE OF BIRTH:  02/08/1930   DATE OF PROCEDURE:  01/03/2008  DATE OF DISCHARGE:                               OPERATIVE REPORT   PREOPERATIVE DIAGNOSIS:  T9, L1, and L5 pathologic compression  fractures.   POSTOPERATIVE DIAGNOSIS:  T9, L1, and L5 pathologic compression  fractures.   PROCEDURE:  T9, L1, L5 acrylic balloon kyphoplasty.   SURGEON:  Stefani Dama, MD   ANESTHESIA:  General endotracheal.   INDICATIONS:  [name] [name] is a 77 year old individual who has been  treated with high-dose steroids in the past number of months for  polymyositis.  He developed acute compression fractures with severe  pain, previously at T10, T11, T12, L2, L3, and L4.  Since his last  fractures, he developed increasing pain and now has fractures of T8 and  L1.  He has been an advised as to the need for acrylic balloon  kyphoplasties including L5, which appears to show some irregular  endplate deformity at this time.  He is taken to the operating room to  undergo these procedures at the current time.   PROCEDURE:  The patient was brought to the operating room supine on a  stretcher.  After smooth induction of general endotracheal anesthesia,  he was turned prone.  The back was prepped with alcohol and DuraPrep and  draped in a sterile fashion.  Fluoroscopic guidance was used in AP and  lateral projections to localize T9, L1, and L5 and by choosing the same  style trajectory starting from the lateral aspect of 10 o'clock position  inserting a needle through the skin and after the skin was anesthetized,  a needle was placed into the lateral aspect through the pedicle of T9,  L1, and L5 and a needle was advanced to the posterior margin of the bony  vertebral body and a biopsy was  obtained from each of T9, L1, and L5.  Balloon was inserted into each of these and these were inflated  sequentially and each of these of bone was noted to be severely  degenerated, easily expandable.  Then at L5, kyphoplasty was started by  inserting a total of 5 mL of cement into the L5 vertebral body.  There  was good interdigitation and filling across the midline in both sides of  the vertebrae.  Similar findings were observed at L1 with good filling  towards the region of the endplates and a similar finding was also noted  at T9.  At T9 a total of 4 mL of glue was inserted, the other 2  vertebrae had each 5 mL.  The needles were withdrawn.  No tails were  noted.  The patient tolerated the procedure well.  Singular stitch of 3-  0 Vicryl was placed into the skin in a subcuticular fashion, and he was  returned to recovery in stable condition.      [name] Cooter  [name], M.D.  Electronically Signed    HJE/MEDQ  D:  01/03/2008  T:  01/04/2008  Job:  [id number]

## 2010-08-24 NOTE — Assessment & Plan Note (Signed)
[facility]                            CARDIOLOGY OFFICE NOTE   NAME:[name], [name]                         MRN:          [id number]  DATE:05/22/2007                            DOB:          [birth year]    Mr. [name] [name] is seen post hospitalization for cardiology followup.  He was  seen in consultation by Dr. Excell Seltzer of our team, and I saw him there  also, and a follow-up arrangement was made with the in the office.  The  patient was hospitalized because he fell and fractured his odontoid  process.  Before this, however, he had been having problems with  shortness of breath.  He had been seen in [place].  In that setting,  there had been a lung scan, and there was question of a slight  abnormality.  There was no abnormality on his venous Dopplers.  Plans  were being made for cardiac catheterization.  He had normal LV function.  He then fell, and he was seen in the hospital.  It was very important to  clear him for his surgery, and therefore he had a V/Q scan, and went it  showed no significant abnormality and his LV function was normal by  echocardiogram, he had his neurosurgery by Dr. Danielle Dess, and he did quite  well.  He has had problems with ongoing shortness of breath, and he has  gained more fluid weight since being in the hospital.  Dr. Danielle Dess  started hydrochlorothiazide, and the patient in fact is already  diuresing, and he will need more diuretics.  He has not had chest pain.   PAST MEDICAL HISTORY:   ALLERGIES:  SULFA.   MEDICATIONS:  1. Temazepam.  2. Lexapro.  3. B12 daily.  4. Vitamin D.  5. Multivitamins.  6. Hydrochlorothiazide in the past few days (to be changed to Lasix).  7. Potassium for a few days, to be changed to 20 mEq daily.   OTHER MEDICAL PROBLEMS:  See the list below.   REVIEW OF SYSTEMS:  He is significantly short of breath with any type of  activity.  He does not have PND or orthopnea.  He also has muscle aches  and  pains.  It is of note that originally when he developed shortness of  breath, he also had diffuse aches and pains in his joints.   Otherwise, his review of systems is negative.   PHYSICAL EXAMINATION:  VITAL SIGNS:  Weight is 159 pounds.  He believes  that he is in the range of 10-14 pounds increase in fluid weight since  approximately November 2008.  Blood pressure is 139/68, with a pulse of  81.  GENERAL:  The patient is here with several family members.  He is  oriented to person, time, and place.  Affect is normal.  NECK:  He is wearing a neck collar that is a substantial support.  I  cannot examine his neck with his collar on.  LUNGS:  Clear.  Respiratory effort is not labored.  CARDIAC:  Reveals an S1 with an S2.  There are no clicks  or significant  murmurs.  ABDOMEN:  Soft.  He does have 2+ peripheral edema.   EKG today reveals normal sinus rhythm, with no acute changes.  It is of  note that his 2-D echo when he was at [facility] was read as showing diastolic  dysfunction.  I did not know the severity.   PROBLEM LIST:  1. History of nephrolithiasis.  2. Sulfa allergy.  3. Trivial aortic insufficiency.  4. Atrial septal aneurysm on echocardiogram, which is of no clinical      significance.  5. Lipid abnormalities.  6. 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  7. History of chronic lymphocytic leukemia, with Rituxan therapy 2      years ago.  8. Question of a slight abnormality on a chest computed tomography      scan in December 2008 at [facility].  It is our thinking that this      probably was not significant.  He had a ventilation-perfusion lung      scan since then showing no obvious pulmonary embolus, and he has      had Dopplers of his legs showing no abnormalities.  9. Status post fall, in which he had an odontoid fracture.  This was      clearly not a syncopal episode.  10.A developing syndrome with shortness of breath and diffuse      arthritic pain that began in  December 2008.  I am not sure yet if      these are related.  From the pulmonary viewpoint, the patient is      seeing Dr. Craige Cotta, and he will have follow-up full breathing tests      done next week.  He will continue to follow with Dr. Craige Cotta.  From my      viewpoint, his shortness of breath may certainly be partially      related to his volume overload.  11.Peripheral edema.  He now has peripheral edema.  It is of note that      he did not have this when he first came into the hospital.      However, it is possible that he was gaining fluid before he showed      it in his feet.  He needs to be diuresed.  I have instructed the      patient and the family carefully about the fact that he may well      lose as much as 10 pounds.  We will put him only on a low dose,      however, of Lasix 40 daily, with 20 of KCl, and will check a BMET      next Monday, and I will see him back in the office next Tuesday.  12.Some muscle weakness and arthritic pain.  Etiology of this remains      unclear.  13.Diastolic dysfunction on his 2D echocardiogram study.  It is      possible that this is playing a significant role.  At some point,      when his volume status is stable and his renal status is stable and      we have best evaluation we can of his pulmonary status, we may well      need to proceed with combined right and left heart catheterization      to rule out other significant abnormalities including coronary      disease and the possibility of restrictive disease or  constrictive disease.  I do not believe his echocardiogram shows      classic findings, but this does not rule it out.     Luis Abed, MD, [facility]  Electronically Signed    JDK/MedQ  DD: 05/22/2007  DT: 05/24/2007  Job #: [id number]   cc:   Coralyn Helling, MD  Stefani Dama, M.D.

## 2010-08-24 NOTE — Assessment & Plan Note (Signed)
[facility]                            CARDIOLOGY OFFICE NOTE   NAME:[name] [name]                         MRN:          [id number]  DATE:05/29/2007                            DOB:          [birth year]    [name] [name] returns today for cardiology follow up.  I had seen him on  May 22, 2007.  At that time, I thought he was volume overloaded  related to diastolic dysfunction.  I started him on Lasix.  He did have  a significant diuresis.  A few days later, he noticed that he was having  significant swallowing difficulties.  There was concern that this might  be related to a relaxed reaction to the Lasix, and the Lasix was held.  He was seen in our office by Nicolasa Ducking, nurse practitioner, on  May 25, 2007.  He arranged for ENT follow up, and the patient was  seen by Dr. Lucky Cowboy of ENT.  I do not have her records.  It is my  understanding, however, that there was esophageal dysfunction, and that  he has had a panda tube placed.  There is discussion about a possible  PEG feeding tube.  He also needs neurologic evaluation.  The actual  scheduling of this is to be done as soon as the patient's office notes  are completed and Dr. Larae Grooms nurse is working on this today.  I spoke  with the nurse today, and she will be calling the patient's daughter  tomorrow.   [name] [name] is here today to discuss further his fluid status and whether  or not he can take Lasix.  There was no evidence of tongue swelling and  no evidence of breathing difficulties.  His difficulty was with  swallowing.  In addition, he said that he breathing improved when his  weight decreased when he diuresed for a few days with Lasix.  There is  no evidence in my opinion that the patient had a reaction to a Lasix.   PAST MEDICAL HISTORY:   ALLERGIES:  SULFA.   MEDICATIONS:  1. Temazepam.  2. Lexapro 10.   OTHER MEDICAL PROBLEMS:  See the list below.   REVIEW OF SYSTEMS:   Overall, he is doing relatively well.  He is  uncomfortable wearing his neck brace, but he knows that he has no choice  in this regard.  He also is tolerating his panda tube.  Otherwise, his  review of systems is negative.   PHYSICAL EXAMINATION:  VITAL SIGNS:  Weight today is 151 pounds.  On his  scale at home, his weight dropped from the low 150s to 145 and is now  back up to approximately 148.  Blood pressure is 127/66 with a heart  rate of 77.  GENERAL:  The patient is oriented to person, time and place.  Affect is  normal.  The patient is here with his 2 daughters.  LUNGS:  The lungs today are clear.  HEENT:  No xanthelasma.  He has normal extraocular motion.  He is  wearing a firm neck brace.  CARDIAC:  An S1 and S2.  There are no clicks or significant murmurs.  ABDOMEN:  Soft.  EXTREMITIES:  He has no significant peripheral edema today.   PROBLEMS:  1. History of nephrolithiasis.  2. SULFA allergy.  3. Trivial aortic insufficiency.  4. Atrial septal aneurysm on echo that is of no clinical significance.  5. Lipid abnormalities.  6. Status post splenectomy in the past.  7. History of CLL with Rituxan therapy 2 years ago.  8. Question of slight abnormality on his chest CT in December of 2008      at [place].  There was question that he may have had a small      pulmonary embolus, but since that time, the VQ scan has not shown      that, and his leg Dopplers are negative.  9. Status post falling with a significant fracture of his odontoid      which led to neurosurgery to Dr. Danielle Dess.  10.Shortness of breath that began probably in late November or early      December of 2008.  He also had some arthritic pain at that time.      From a pulmonary viewpoint, he is seeing Dr. Craige Cotta, and he is to      have full pulmonary function studies.  I do believe there is a      volume component.  I cannot explain the arthritic pain.  11.Peripheral edema, volume overload that has improved since  I saw him      last.  Despite the fact that the weights look similar on our scale,      his weight has decreased by approximately 5-7 pounds since he was      here.  Clinically, he is feeling better, and there is no question      that he responds nicely to diuresis with Lasix.  12.Some muscle weakness and arthritic pain, and the etiology remains      unclear.  13.Diastolic dysfunction on his 2-D echocardiogram.  It is possible      that this is playing a major role.  When his fluid status is stable      and his pulmonary evaluation is completed, he might need right and      left heart catheterization for further evaluation, but this is not      needed at this time.  From a cardiac viewpoint, he needs to have      his Lasix dose titrated to his volume status.  He will restart his      Lasix in liquid form for his PD feeding tube.  If he loses more      than a few pounds, he will hold the Lasix and restart it only when      he begins to gain fluid weight again.   Laboratories were drawn through [facility] yesterday, and these results  are not yet available to [place], but when we have them, they will be  communicated to me.   I am hopeful that the ENT doctors will be able to finalize their  referral to neurology and be in touch with the patient's daughter  tomorrow, and also there needs to be further discussion concerning  whether or not a PEG tube is or is not to be placed.   From the cardiac viewpoint, I believe he is stable.  I will see him back  in 4 weeks for cardiology follow up.  I have spent an extensive amount  of time with the patient and his family today, and hopefully this has  helped them.     Luis Abed, MD, [facility]  Electronically Signed    JDK/MedQ  DD: 05/29/2007  DT: 05/29/2007  Job #: [id number]   cc:   Stefani Dama, M.D.  Coralyn Helling, MD  Elizabeth Sauer, M.D.  Lucky Cowboy, MD

## 2010-08-24 NOTE — Consult Note (Signed)
NAMEEARLIE, [name] [name]                ACCOUNT NO.:  [id number]   MEDICAL RECORD NO.:  [id number]          PATIENT TYPE:  INP   LOCATION:  3303                         FACILITY:  [facility]   PHYSICIAN:  Coralyn Helling, MD        DATE OF BIRTH:  October 15, 1929   DATE OF CONSULTATION:  05/15/2007  DATE OF DISCHARGE:                                 CONSULTATION   REASON FOR CONSULTATION:  Progressive dyspnea.   [name] [name] is a 77 year old male who was admitted on May 10, 2007,  after falling at his house and sustaining an odontoid fracture.  He  says, however that prior to this he had been getting progressively short  of breath over the last 1 month.  He says prior to December 2008 he had  no difficulties with his breathing but since then he gets short of  breath with minimal activity such as even just sitting up or changing  his clothes.  He has also been having problems with stiffness and pain  in his shoulders and hip joints bilaterally.  He, however, denies any  swelling in other joints or skin rashes.  He denies any symptoms of  coughing, wheezing or sputum production.  He also denies any chest  tightness or chest pain.  He has not had any recent fever, chills,  sweats or significant weight loss.  He has not had any history  previously of respiratory difficulties such as tuberculosis, pneumonia  or asthma.  He denies any allergy symptoms.  He has not had any recent  travel history.  There are no significant occupational exposures.  He  has not had any recent sick exposures.  He had an echocardiogram done on  May 11, 2007, which showed an ejection fraction of 60% with normal  left ventricular systolic function as well as normal right ventricular  function.  He had a CT scan of his chest done last month, which per  report showed a small segmental pulmonary embolism but the patient  states that he was told it there was inconsequential and was not placed  on anticoagulation therapy.  He has  since had a Doppler study of his  legs, which was negative.  He also had a VQ scan of his chest, which was  low-probability.  He had a chest x-ray, which was essentially  unremarkable.  He was scheduled to have a cardiac catheterization done  to further evaluate his symptoms of dyspnea.  However, he sustained his  cervical spine fracture and this had to be postponed.  He did have his  cervical spine stabilized by anterior screwing on February 2.   OUTPATIENT MEDICATIONS:  Lexapro and Restoril as well as multivitamins.   PAST MEDICAL HISTORY:  1. Chronic lymphocytic leukemia, status post therapy with Rituxan 2      years ago.  2. Status post splenectomy.  3. Hyperlipidemia.  4. Nephrolithiasis.  5. Anemia.  6. Herniorrhaphy.   ALLERGIES:  He has allergies to SULFA, which causes him to get a rash.   SOCIAL HISTORY:  He is retired.  He used to  work with [employer].  He quit  smoking 30 years ago.  He started at the age of 81 and smoked about a  half-pack of cigarettes a day.  He denies any significant alcohol use.   FAMILY HISTORY:  Not significant.   REVIEW OF SYSTEMS:  Unremarkable except as for stated above.   PHYSICAL EXAM:  Temperature is 98.2, blood pressure is 144/49,  respiratory rate is 20, oxygen saturation is 100% on room air, heart  rate is 80 and regular.  He is seen in his hospital room.  He has a cervical collar in place.  He  is awake, alert and oriented, does not appear to be in acute distress.  Pupils reactive.  There is no sinus tenderness.  There are no oral  lesions.  HEART:  S1, S2, regular rate and rhythm.  No murmur.  CHEST:  He had good air entry bilaterally and there were no wheezing or  rales.  ABDOMEN:  Soft, nontender, positive bowel sounds.  EXTREMITIES:  He had 2+ leg edema, which he says started after his  surgery.  NEUROLOGIC:  He is awake, alert and oriented.  He had normal strength.  No sensory deficits were appreciated.   Hemoglobin is 12.4,  hematocrit is 36.3, WBC is 9.9, platelet count is  373.  Sodium is 132, potassium is 4.5, chloride is 101, CO2 is 24, BUN  is 20, creatinine is 0.7, glucose is 114.  PT is 12.8, INR is 0.9, PTT  is 37.  AST is 151, ALT is 40, ALP is 59, bilirubin is 1.1, albumin is  3.6, calcium is 8.6.  Erythrocyte sedimentation rate is 50.   IMPRESSION:  Progressive dyspnea in the setting of joint pain as well as  complaints of muscle weakness, which the patient describes as being more  proximal although I did not really appreciate this on his exam.  He does  have an increase in his sedimentation rate.  It is possible that he  could have some degree of respiratory muscle weakness.  To further  assess for this possibility, I will check his ANA, a rheumatoid factor,  a C-reactive protein and thyroid function.  At some point he will need  to have pulmonary function tests to further evaluate his respiratory  muscle strength.  However, with him being in a cervical collar it would  be difficult to determine if the results of pulmonary function tests  done at this time would be completely accurate, and I would prefer to  defer these tests until his cervical spine is somewhat more stable.  He  does also have a history of tobacco use and certainly the possibility of  underlying chronic obstructive pulmonary disease needs to be considered,  but this again could be further assessed when he has his pulmonary  function testing done.  There was also a concern for pulmonary embolism.  I would like to be able to obtain a copy of his previous CT scan to  review for myself; otherwise, however, his chest x-ray and his V/Q scan  performed here were essentially unremarkable.  In addition, there was no  evidence for pulmonary hypertension on his recent echocardiogram and,  therefore, even if he did have pulmonary embolism, I suspect that it  would not be enough to explain his current symptoms of dyspnea.  In  addition, he  still needs to have further cardiac evaluation with a  coronary angiogram and what I would recommend is that he proceed with  this first before pursuing any other interventions with regard to his  pulmonary status.  If his coronary angiogram is unremarkable, then  certainly we can proceed with the above-mentioned tests.      Coralyn Helling, MD  Electronically Signed     VS/MEDQ  D:  05/15/2007  T:  05/16/2007  Job:  [id number]

## 2010-08-24 NOTE — Assessment & Plan Note (Signed)
[facility]                            CARDIOLOGY OFFICE NOTE   NAME:POWELLErnest, Popowski                         MRN:          [id number]  DATE:01/31/2008                            DOB:          06/03/1929    Mr. Golubski looks great today.  He has had further procedures to help his  vertebrae.  He is not having back pain today.  When I saw him on January 14, 2008, I was worried about EKG changes with decreased anterior R-wave  progression.  We decided to bring him back today to do a 2-D echo.  The  study is not read officially yet, but I have reviewed the images.  There  is only very minor AI.  He has excellent left ventricular function.  He  has evidence of diastolic dysfunction.  This represents no significant  change and argues that the EKG changes do not represent any significant  problem.  He is feeling well and doing well.   PAST MEDICAL HISTORY:   ALLERGIES:  SULFA and CEPHALEXIN.   MEDICATIONS:  See the flow sheet in the chart.   OTHER MEDICAL PROBLEMS:  See the list below.   REVIEW OF SYSTEMS:  He feels great.  He is not having any GI or GU  symptoms.  His review of systems is negative.   PHYSICAL EXAMINATION:  Blood pressure is 120/72 with a pulse of 87.  The patient is oriented to person, time, and place.  Affect is normal.  HEENT:  No xanthelasma.  His skin is mildly flush.  He tells me he  thinks this is from the steroids.  LUNGS:  Clear.  CARDIAC:  S1 and S2.  There are no clicks or significant murmurs.  ABDOMEN:  Soft.  There is no significant peripheral edema.   I have reviewed labs that he has brought me today.  His potassium is  5.1.  I have lowered his potassium dosing of 40 mEq daily.  BUN is 22  and creatinine is 0.65.  His liver functions have normalized.  He looks  great.   Problems are listed completely on the note of October 09, 2007.  His aortic  insufficiency is stable.  He has hyperlipidemia.  We have chosen not to  use  statins.  His breathing is better.  He has no  edema.  His EKG changes represent no significant abnormality.  He is  stable.  I will see him back in 6 months.     Luis Abed, MD, [facility]  Electronically Signed    JDK/MedQ  DD: 01/31/2008  DT: 02/01/2008  Job #: [id number]   cc:   Marlan Palau, M.D.  Coralyn Helling, MD  Seth Bake, M.D.  Venita Lick. Russella Dar, MD, Gilman Buttner, MD

## 2010-08-24 NOTE — H&P (Signed)
NAMECOLEMAN, [name]                ACCOUNT NO.:  [id number]   MEDICAL RECORD NO.:  [id number]          PATIENT TYPE:  INP   LOCATION:  2922                         FACILITY:  [facility]   PHYSICIAN:  Bevelyn Buckles. Bensimhon, MDDATE OF BIRTH:  1929/08/24   DATE OF ADMISSION:  06/08/2007  DATE OF DISCHARGE:                              HISTORY & PHYSICAL   PRIMARY CARDIOLOGIST:  Luis Abed, MD, [facility]   PULMONOLOGIST:  Clifton James, M.D.   NEUROSURGEON:  Stefani Dama, M.D.   PATIENT PROFILE:  77 year old Caucasian male with history of chronic  dyspnea who presents with worsening edema and weight gain as well as  cough and subjective fevers.   PROBLEM:  1. History of dyspnea.      a.     05/11/2007: Low-probability VQ scan.      b.     05/11/2007: 2D echocardiogram. EF 60-65%. No regional wall       abnormalities. Trivial AI. Atrial septal aneurysm.  2. Chronic diastolic congestive heart failure requiring oral Lasix.  3. Chronic lymphocytic leukemia.  4. Hyperlipidemia.  5. History of nephrolithiasis.  6. Dysphagia with question of aspiration, now with Panda tube for      nutrition and pills.  7. Status post fall with type 2 odontoid fracture with posterior      displacement status post anterior screw fixation by Dr. Danielle Dess less      than 1 month ago.   HISTORY OF PRESENT ILLNESS:  This is a 77 year old Caucasian male with  history of chronic dyspnea, diastolic heart failure, and chronic  lymphocytic leukemia. Since his recent neck surgery earlier this month,  which was performed secondary to an odontoid fracture, the patient has  had intermittent dyspnea and edema managed with oral Lasix with varying  effect.  Unfortunately, postoperative course has also been complicated  by dysphagia with question of aspiration and reduction in p.o. intake.  He was subsequently seen by ENT and noted to have laryngeal dysfunction  and a Panda tube was placed. Over the past 5 days or so, he has had  worsening dyspnea and now occurring at rest along with chest congestion,  cough, and nightly fevers. He was given Levaquin by his primary care  Charese Abundis. No chest x-rays were performed per family. He has not been any  better despite antibiotics and, now, over the past 2 to 3 days, he has  had worsening lower extremity edema, weight gain, and over the past 2  days, specifically, he has had left arm swelling from the biceps down.  He had some labs drawn this morning showing a sodium of 129. His family  called into our office to report on the labs and also noted his  worsening progressive dyspnea and they were advised to present to the ED  for evaluation.   ALLERGIES:  SULFA CAUSES RASH.   HOME MEDICATIONS:  1. Temazepam 30 mg q.h.s.  2. Lexapro 10 mg every day.  3. Klor-Con 20 mEq every day.  4. Lasix 40 mg every day.  5. Oxycodone suspension 5 mg/mL 2-3 times pr day  p.r.n. tube feed.  6. Levaquin 500 mg daily, course already completed.   FAMILY HISTORY:  Noncontributory.   SOCIAL HISTORY:  The patient lives in [place] with his daughter. He  is retired from [employer]. He previously smoked but quit more than 30 years  ago. He denies any alcohol or drugs. He is not exercising.   REVIEW OF SYSTEMS:  Positive for fevers and chills, occurring mostly at  night over the past couple of days. He has dyspnea on exertion dating  back to October or November of  2008; however, this has progressed in  the past week. He has also had progression of chest congestion and cough  with white sputum over the past week. He has had lower extremity  weakness and has been unsteady on his feet. He had dysphagia with  question of aspiration and is status post Panda placement earlier this  month and apparently is being considered for PEG tube. Otherwise, all  systems reviewed are negative.   PHYSICAL EXAMINATION:  VITAL SIGNS: Temperature 97.5, heart rate 75,  respirations 24, blood pressure 115/59, pulse ox 90%  on room air.  GENERAL: Pleasant ill-appearing white male in no acute distress. Awake,  alert, and oriented times 3.  HEENT: The patient has a neck collar in place. Otherwise normal.  NEURO: Grossly intact and nonfocal.  SKIN:  Warm and dry without lesions or masses.  LUNGS: Respirations regular, unlabored with coarse rumbling breath  sounds, left greater than right.  CARDIAC: Regular S1, S2. No S3, S4 murmurs.  ABDOMEN: Round, soft, nontender, nondistended. Bowel sounds present  times 4.  EXTREMITIES: Warm, dry, pink with 2+ bilateral lower extremity edema and  3+ left upper extremity edema. He has good distal pulses both in upper  and lower extremities bilaterally.   Chest x-ray is pending. EKG is pending. Sodium 129, potassium 4.7,  chloride 93, CO2 27, BUN 28, creatinine 0.5, glucose 122.   ASSESSMENT/PLAN:  1. Dyspnea, likely multifactorial.  He has weight gain scrotal and      lower extremity edema. Plan to admit and check BNP and gently      diurese with 40 mg of IV Lasix b.i.d. for the time being. His heart      rate and blood pressure are well controlled. Also with history of      dysphagia and subjective fevers with increasing chest congestion      and cough, must question the possibility of pneumonia. We will      check a chest x-ray here and have a low threshold for initiation of      antibiotics. Also plan to check albumin. Of note, the patient has      not yet had an ischemic evaluation and, as condition improves, we      can reconsider this. Repeat echo.  2. Left upper extremity edema. ?Deep venous thrombosis. We will check      an ultrasound. In light of his other symptoms, we will also go      ahead and get a CT of his chest to rule out pulmonary embolism.  3. Dysphagia. ?Aspiration. As above, we are checking albumin with his      significant edema. We will obtain a nutrition consult to guide tube      feedings.  4. Status post neck fracture. Pain management with  Oxycodone      suspension as he was taking at home.      Nicolasa Ducking, ANP  Bevelyn Buckles. Bensimhon, MD  Electronically Signed    CB/MEDQ  D:  06/08/2007  T:  06/09/2007  Job:  [id number]   cc:   Luis Abed, MD, [facility]  Randie Heinz, M.D.

## 2010-08-24 NOTE — Assessment & Plan Note (Signed)
[facility]                            CARDIOLOGY OFFICE NOTE   NAME:POWELLZayquan, Bogard                         MRN:          [id number]  DATE:07/04/2007                            DOB:          Mar 07, 1930    Mr. Goyer is seen after his hospitalization.  He had an extensive  hospitalization.  During that time, the clinical diagnosis of myositis  was made.  He is on high-dose prednisone and he is improving.  His  muscle biopsy results are still pending and they will go to Dr. Lesia Sago.   Mr. Isensee is here today with two of his daughters  and looks quite  good.  Dr. Danielle Dess has removed his cervical collar today.  His peg is in  place and he is tolerating 2000 calories a day.  His scrotal edema has  finally resolved and has no more swelling in his left arm.  He is  becoming physically more active.  He can swallow a small amount  carefully.  Overall he is improving.  I will not review the entire  hospital course.  This is listed in the discharge summary and my problem  list below outlines all of the issues.  Everyone is pleased with his  progress.   PAST MEDICAL HISTORY:   ALLERGIES:  SULFA.   MEDICATIONS:  1. Temazepam at night p.r.n.  2. Lexapro 10.  3. Lasix 80 b.i.d. (to be increased to be decreased to 80 mg daily      today).  4. Kay Ciel 40 mEq daily.  5. Os-Cal and iron.  6. Prednisone 60 mg daily.  7. Nexium 40 mg daily.  8. PEG  feeding of 2000 calories per day.   OTHER MEDICAL PROBLEMS:  See the complete list below.   REVIEW OF SYSTEMS:  He is feeling better and he looks great.  Review of  systems otherwise is negative.   PHYSICAL EXAM:  Weight is 132 pounds.  Blood pressure is 104/60 with a  pulse of 74.  The patient is oriented to person, time and place.  Affect is normal.  HEENT:  Reveals no xanthelasma.  He has normal extraocular motion.  There are no carotid bruits.  There is no jugular venous tension.  There  is no  swelling in his left arm.  LUNGS:  Clear.  Respiratory effort is not labored.  CARDIAC:  Exam reveals an S1 with an S2.  There are no clicks or  significant murmurs.  His PEG tube is in place.  He has no scrotal edema.  He has no  peripheral edema.   EKG reveals sinus rhythm with no significant abnormalities.  Today we  sent a CMET, TSH, CPK and a CBC and these will be looked at tomorrow or  the next day.   PROBLEM LIST:  1. Nephrolithiasis.  2. SULFA allergy.  3. Trivial aortic insufficiency.  4. Atrial septal aneurysm of no clinical significance.  5. Hyperlipidemia.  At at later date, we will have to reconsider      medicines for him.  6. 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  7. Shortness of breath.  He had no pulmonary emboli.  His high-      resolution CT revealed no interstitial lung disease.  He is      followed by Dr. Craige Cotta.  He was also seen in the hospital by Dr.      Delton Coombes. He had pulmonary function studies and it is my understanding      that they look quite good.  Keep in mind that they may not have all      of the components specifically related to his overall chest      movement capacity and this can be kept in mind.  8. Left arm swelling.  The etiology of this in the hospital was never      clear.  He had no DVT and it is now improved.  9. Edema.  This is improved completely including his scrotal edema      which improved last.  10.Myositis. Muscle biopsy remains pending.  The patient will see Dr.      Anne Hahn on April 16 and he remains on 60 mg of prednisone daily      until then.  11.Good LV function.  12.Mild diastolic dysfunction.  13.Nutrition with his PEG  tube as outlined above.  14.History of CLL treated with Rituxan in the past.  15.Status post a falling injury with fractures of C2 earlier this year      with surgery by Dr. Danielle Dess and his soft collar is now off.  16.Guaiac positive stool. This will need to be repeated again in the      future as he has  not had this fully assessed.  17.Question of coronary disease.  Cath was never done and we have      never seen obvious ischemia.  18.Scrotal edema.  This finally disappeared when his albumin began to      rise.  19.Diarrhea that was associated with various different feeding      regimens which was resolved with proper feeding regimen.  20.Prednisone therapy. Dr. Anne Hahn is overseeing this.   Overall he is improving.  We will check the labs mentioned above and  then I will be back in touch with him. In the meantime, his Lasix will  be cut to 80 once a day and we will decide tomorrow how much potassium  he should remain on.     Luis Abed, MD, [facility]  Electronically Signed   JDK/MedQ  DD: 07/04/2007  DT: 07/05/2007  Job #: [id number]   cc:   Marlan Palau, M.D.  Franky Macho, MD  Coralyn Helling, MD  Janak Derald Macleod, M.D.

## 2010-08-24 NOTE — Discharge Summary (Signed)
NAMEGEOFFREY, [name]                ACCOUNT NO.:  [id number]   MEDICAL RECORD NO.:  [id number]          PATIENT TYPE:  INP   LOCATION:  3533                         FACILITY:  [facility]   PHYSICIAN:  Danae Orleans. Venetia Maxon, M.D.  DATE OF BIRTH:  Aug 27, 1929   DATE OF ADMISSION:  11/16/2007  DATE OF DISCHARGE:  11/16/2007                               DISCHARGE SUMMARY   REASON FOR ADMISSION:  Pathologic fracture of vertebrae with additional  diagnoses of:  1. Polymyositis.  2. Osteoporosis.  3. Adverse effects of corticosteroids.  4. Arthropathy NOS unspecified.  5. Anemia NOS.  6. Long-term use of recurrent use of steroids.  7. Personal history of pathologic fracture.   FINAL DIAGNOSES:  Pathologic fracture of vertebrae with additional  diagnoses of:  1. Polymyositis.  2. Osteoporosis.  3. Adverse effects of corticosteroids.  4. Arthropathy, not otherwise specified unspecified.  5. Anemia, not otherwise specified.  6. Long-term use of recurrent use of steroids.  7. Personal history of pathologic fracture.   HISTORY OF ILLNESS AND HOSPITAL COURSE:  [name] is a 77 year old  man with longstanding osteoporosis of the T11.  He has undergone  previous T11, 12, and L3 kyphoplasty by Dr. Danielle Dess on October 30, 2007.  He  came in with a significant increased pain and was found to have acute  endplate fractures of T10 and L2 and L4.  Dr. Danielle Dess had gone out of  town and I assumed the care of the patient and performed T10, L2, and L4  kyphoplasties.  The patient did well immediately following the procedure  and wished to go home, was then had significant improvement in pain  control, was discharged home in stable satisfactory condition, and he  tolerated the procedure well.      Danae Orleans. Venetia Maxon, M.D.  Electronically Signed     JDS/MEDQ  D:  01/15/2008  T:  01/16/2008  Job:  [id number]

## 2010-09-10 ENCOUNTER — Ambulatory Visit: Payer: Self-pay | Admitting: Oncology

## 2010-10-10 ENCOUNTER — Ambulatory Visit: Payer: Self-pay | Admitting: Oncology

## 2010-11-10 ENCOUNTER — Ambulatory Visit: Payer: Self-pay | Admitting: Oncology

## 2010-12-11 ENCOUNTER — Ambulatory Visit: Payer: Self-pay | Admitting: Oncology

## 2010-12-30 LAB — URINALYSIS, ROUTINE W REFLEX MICROSCOPIC
Glucose, UA: NEGATIVE
Hgb urine dipstick: NEGATIVE
Specific Gravity, Urine: 1.036 — ABNORMAL HIGH
Urobilinogen, UA: 0.2
pH: 5.5

## 2010-12-30 LAB — DIFFERENTIAL
Basophils Absolute: 0.1
Basophils Relative: 1
Eosinophils Absolute: 0
Eosinophils Relative: 0
Monocytes Absolute: 1.2 — ABNORMAL HIGH
Monocytes Relative: 12
Neutro Abs: 6.9

## 2010-12-30 LAB — COMPREHENSIVE METABOLIC PANEL
ALT: 40
AST: 151 — ABNORMAL HIGH
Albumin: 2.6 — ABNORMAL LOW
Alkaline Phosphatase: 59
BUN: 20
Chloride: 101
Potassium: 4.5
Sodium: 132 — ABNORMAL LOW
Total Bilirubin: 1.1
Total Protein: 5.1 — ABNORMAL LOW

## 2010-12-30 LAB — CBC
HCT: 36.3 — ABNORMAL LOW
Platelets: 273
WBC: 9.9

## 2010-12-30 LAB — URINE MICROSCOPIC-ADD ON

## 2010-12-31 LAB — DIFFERENTIAL
Basophils Absolute: 0.1
Basophils Relative: 1
Eosinophils Absolute: 0
Eosinophils Relative: 0
Lymphocytes Relative: 24
Monocytes Absolute: 2.3 — ABNORMAL HIGH

## 2010-12-31 LAB — B-NATRIURETIC PEPTIDE (CONVERTED LAB): Pro B Natriuretic peptide (BNP): 49

## 2010-12-31 LAB — PROTIME-INR: Prothrombin Time: 12.5

## 2010-12-31 LAB — BASIC METABOLIC PANEL
CO2: 28
Calcium: 8.4
Chloride: 93 — ABNORMAL LOW
Creatinine, Ser: 0.57
GFR calc Af Amer: >60
Glucose, Bld: 134 — ABNORMAL HIGH
Sodium: 129 — ABNORMAL LOW

## 2010-12-31 LAB — COMPREHENSIVE METABOLIC PANEL
ALT: 37
ALT: 33
AST: 125 — ABNORMAL HIGH
AST: 103 — ABNORMAL HIGH
Albumin: 2.6 — ABNORMAL LOW
Alkaline Phosphatase: 65
Alkaline Phosphatase: 74
CO2: 28
CO2: 28
Calcium: 8.8
Chloride: 95 — ABNORMAL LOW
GFR calc Af Amer: >60
GFR calc Af Amer: >60
GFR calc non Af Amer: >60
Glucose, Bld: 148 — ABNORMAL HIGH
Potassium: 4.6
Potassium: 4.6
Sodium: 131 — ABNORMAL LOW
Sodium: 130 — ABNORMAL LOW
Total Bilirubin: 0.8
Total Protein: 5.4 — ABNORMAL LOW

## 2010-12-31 LAB — CARDIAC PANEL(CRET KIN+CKTOT+MB+TROPI)
CK, MB: 9 — ABNORMAL HIGH
CK, MB: 9 — ABNORMAL HIGH
Relative Index: 2.3
Total CK: 341 — ABNORMAL HIGH
Total CK: 395 — ABNORMAL HIGH
Troponin I: 0.02
Troponin I: 0.02

## 2010-12-31 LAB — CBC
HCT: 35.1 — ABNORMAL LOW
Hemoglobin: 11.4 — ABNORMAL LOW
Hemoglobin: 11.7 — ABNORMAL LOW
MCHC: 33.9
MCV: 101.8 — ABNORMAL HIGH
RBC: 3.31 — ABNORMAL LOW
RDW: 16 — ABNORMAL HIGH
WBC: 12.1 — ABNORMAL HIGH

## 2010-12-31 LAB — ANA: Anti Nuclear Antibody(ANA): NEGATIVE

## 2010-12-31 LAB — TROPONIN I: Troponin I: 0.01

## 2010-12-31 LAB — CULTURE, BLOOD (ROUTINE X 2): Culture: NO GROWTH

## 2010-12-31 LAB — CK TOTAL AND CKMB (NOT AT ARMC): Relative Index: 3.2 — ABNORMAL HIGH

## 2010-12-31 LAB — APTT: aPTT: 34

## 2011-01-03 LAB — CBC
HCT: 29.4 — ABNORMAL LOW
HCT: 29.7 — ABNORMAL LOW
HCT: 30.7 — ABNORMAL LOW
HCT: 31.9 — ABNORMAL LOW
Hemoglobin: 10 — ABNORMAL LOW
Hemoglobin: 10.5 — ABNORMAL LOW
Hemoglobin: 10.8 — ABNORMAL LOW
MCHC: 33.7
MCHC: 33.7
MCHC: 34
MCHC: 34.3
MCV: 101.6 — ABNORMAL HIGH
MCV: 102.7 — ABNORMAL HIGH
MCV: 103 — ABNORMAL HIGH
MCV: 103.9 — ABNORMAL HIGH
Platelets: 332
RBC: 2.86 — ABNORMAL LOW
RBC: 2.92 — ABNORMAL LOW
RBC: 2.98 — ABNORMAL LOW
RBC: 3.07 — ABNORMAL LOW
RBC: 3.12 — ABNORMAL LOW
WBC: 10.8 — ABNORMAL HIGH
WBC: 11.4 — ABNORMAL HIGH
WBC: 11.9 — ABNORMAL HIGH
WBC: 8.5
WBC: 9.9

## 2011-01-03 LAB — URINALYSIS, ROUTINE W REFLEX MICROSCOPIC
Ketones, ur: NEGATIVE
Nitrite: NEGATIVE
Protein, ur: NEGATIVE

## 2011-01-03 LAB — COMPREHENSIVE METABOLIC PANEL
ALT: 19
AST: 88 — ABNORMAL HIGH
Alkaline Phosphatase: 79
CO2: 33 — ABNORMAL HIGH
CO2: 34 — ABNORMAL HIGH
Calcium: 8.7
Chloride: 97
Creatinine, Ser: 0.62
GFR calc Af Amer: >60
GFR calc non Af Amer: >60
GFR calc non Af Amer: >60
Glucose, Bld: 110 — ABNORMAL HIGH
Glucose, Bld: 127 — ABNORMAL HIGH
Potassium: 3.6
Sodium: 138
Total Bilirubin: 0.6
Total Protein: 4.8 — ABNORMAL LOW

## 2011-01-03 LAB — BASIC METABOLIC PANEL
BUN: 26 — ABNORMAL HIGH
BUN: 29 — ABNORMAL HIGH
BUN: 30 — ABNORMAL HIGH
CO2: 30
CO2: 32
CO2: 33 — ABNORMAL HIGH
CO2: 33 — ABNORMAL HIGH
CO2: 34 — ABNORMAL HIGH
Calcium: 8.5
Calcium: 9
Calcium: 9.1
Calcium: 9.4
Chloride: 100
Chloride: 95 — ABNORMAL LOW
Chloride: 96
Chloride: 98
Chloride: 99
Creatinine, Ser: 0.46
Creatinine, Ser: 0.53
Creatinine, Ser: 0.55
Creatinine, Ser: 0.56
Creatinine, Ser: 0.57
Creatinine, Ser: 0.57
GFR calc Af Amer: >60
GFR calc Af Amer: >60
GFR calc Af Amer: >60
GFR calc Af Amer: >60
GFR calc Af Amer: >60
GFR calc Af Amer: >60
GFR calc Af Amer: >60
GFR calc non Af Amer: >60
GFR calc non Af Amer: >60
GFR calc non Af Amer: >60
GFR calc non Af Amer: >60
GFR calc non Af Amer: >60
GFR calc non Af Amer: >60
Glucose, Bld: 114 — ABNORMAL HIGH
Glucose, Bld: 123 — ABNORMAL HIGH
Glucose, Bld: 126 — ABNORMAL HIGH
Glucose, Bld: 128 — ABNORMAL HIGH
Potassium: 3.4 — ABNORMAL LOW
Potassium: 3.7
Potassium: 3.7
Potassium: 3.7
Potassium: 3.8
Potassium: 3.9
Potassium: 4.6
Sodium: 131 — ABNORMAL LOW
Sodium: 134 — ABNORMAL LOW
Sodium: 135
Sodium: 136
Sodium: 136
Sodium: 137
Sodium: 140

## 2011-01-03 LAB — JO-1 ANTIBODY-IGG: Jo-1 Antibody, IgG: <0.2 AI (ref <1.0)

## 2011-01-03 LAB — CLOSTRIDIUM DIFFICILE EIA

## 2011-01-03 LAB — BLOOD GAS, ARTERIAL
Acid-Base Excess: 7.9 — ABNORMAL HIGH
Drawn by: 296031
Patient temperature: 98.6
TCO2: 33
pH, Arterial: 7.48 — ABNORMAL HIGH

## 2011-01-03 LAB — MISCELLANEOUS TEST

## 2011-01-03 LAB — ACETYLCHOLINE RECEPTOR, BINDING: Acetylcholine Receptor Ab: <0.25

## 2011-01-03 LAB — IRON: Iron: 35 — ABNORMAL LOW

## 2011-01-07 LAB — URINE MICROSCOPIC-ADD ON

## 2011-01-07 LAB — BASIC METABOLIC PANEL
BUN: 20
CO2: 32
CO2: 31
Calcium: 9.6
Calcium: 9.6
Chloride: 98
Creatinine, Ser: 0.71
Creatinine, Ser: 0.61
GFR calc Af Amer: >60
GFR calc Af Amer: >60
GFR calc non Af Amer: >60
Glucose, Bld: 181 — ABNORMAL HIGH
Sodium: 137

## 2011-01-07 LAB — URINALYSIS, ROUTINE W REFLEX MICROSCOPIC
Glucose, UA: NEGATIVE
Hgb urine dipstick: NEGATIVE
Ketones, ur: NEGATIVE
Nitrite: POSITIVE — AB
Protein, ur: 30 — AB
Protein, ur: NEGATIVE
Specific Gravity, Urine: 1.019
pH: 5.5

## 2011-01-07 LAB — CBC
Hemoglobin: 14.1
MCHC: 32.8
MCHC: 32.6
MCV: 99.7
Platelets: 398
RBC: 4.23
RBC: 4.32
RDW: 16.9 — ABNORMAL HIGH
WBC: 7.8

## 2011-01-07 LAB — URINE CULTURE

## 2011-01-10 ENCOUNTER — Ambulatory Visit: Payer: Self-pay | Admitting: Oncology

## 2011-01-10 LAB — HEPATIC FUNCTION PANEL
ALT: 92 — ABNORMAL HIGH
ALT: 22
AST: 58 — ABNORMAL HIGH
AST: 23
Albumin: 3.7
Bilirubin, Direct: 0.1
Indirect Bilirubin: 0.8
Total Bilirubin: 0.9
Total Protein: 6.2

## 2011-01-10 LAB — BASIC METABOLIC PANEL
BUN: 18
BUN: 22
Calcium: 9.3
Chloride: 103
Creatinine, Ser: 0.77
GFR calc non Af Amer: >60
Glucose, Bld: 125 — ABNORMAL HIGH
Potassium: 5
Potassium: 5.1
Sodium: 138

## 2011-01-10 LAB — CBC
HCT: 44
Hemoglobin: 14.4
MCV: 103.3 — ABNORMAL HIGH
Platelets: 353
Platelets: 320
RDW: 16 — ABNORMAL HIGH
RDW: 15.4
WBC: 8.7

## 2011-01-12 LAB — COMPREHENSIVE METABOLIC PANEL
ALT: 230 — ABNORMAL HIGH
AST: 160 — ABNORMAL HIGH
Albumin: 3.5
Alkaline Phosphatase: 294 — ABNORMAL HIGH
Calcium: 9
GFR calc Af Amer: >60
Glucose, Bld: 108 — ABNORMAL HIGH
Potassium: 4.8
Sodium: 138
Total Protein: 5.7 — ABNORMAL LOW

## 2011-01-12 LAB — CBC
Hemoglobin: 13.6
MCHC: 32.5
RBC: 4.08 — ABNORMAL LOW
RDW: 16.5 — ABNORMAL HIGH

## 2011-01-14 ENCOUNTER — Other Ambulatory Visit (HOSPITAL_COMMUNITY): Payer: Self-pay | Admitting: Radiology

## 2011-01-14 DIAGNOSIS — I351 Nonrheumatic aortic (valve) insufficiency: Secondary | ICD-10-CM

## 2011-01-17 ENCOUNTER — Encounter: Payer: Self-pay | Admitting: Cardiology

## 2011-01-17 ENCOUNTER — Ambulatory Visit (INDEPENDENT_AMBULATORY_CARE_PROVIDER_SITE_OTHER): Payer: Medicare Other | Admitting: Cardiology

## 2011-01-17 ENCOUNTER — Ambulatory Visit (HOSPITAL_COMMUNITY): Payer: Medicare Other | Attending: Cardiology | Admitting: Radiology

## 2011-01-17 ENCOUNTER — Other Ambulatory Visit (HOSPITAL_COMMUNITY): Payer: Medicare Other | Admitting: Radiology

## 2011-01-17 DIAGNOSIS — I351 Nonrheumatic aortic (valve) insufficiency: Secondary | ICD-10-CM | POA: Insufficient documentation

## 2011-01-17 DIAGNOSIS — I519 Heart disease, unspecified: Secondary | ICD-10-CM

## 2011-01-17 DIAGNOSIS — R0609 Other forms of dyspnea: Secondary | ICD-10-CM | POA: Insufficient documentation

## 2011-01-17 DIAGNOSIS — I340 Nonrheumatic mitral (valve) insufficiency: Secondary | ICD-10-CM

## 2011-01-17 DIAGNOSIS — R609 Edema, unspecified: Secondary | ICD-10-CM

## 2011-01-17 DIAGNOSIS — R0602 Shortness of breath: Secondary | ICD-10-CM

## 2011-01-17 DIAGNOSIS — R079 Chest pain, unspecified: Secondary | ICD-10-CM | POA: Insufficient documentation

## 2011-01-17 DIAGNOSIS — I4510 Unspecified right bundle-branch block: Secondary | ICD-10-CM

## 2011-01-17 DIAGNOSIS — E785 Hyperlipidemia, unspecified: Secondary | ICD-10-CM | POA: Insufficient documentation

## 2011-01-17 DIAGNOSIS — R0989 Other specified symptoms and signs involving the circulatory and respiratory systems: Secondary | ICD-10-CM | POA: Insufficient documentation

## 2011-01-17 DIAGNOSIS — R001 Bradycardia, unspecified: Secondary | ICD-10-CM

## 2011-01-17 DIAGNOSIS — I498 Other specified cardiac arrhythmias: Secondary | ICD-10-CM

## 2011-01-17 DIAGNOSIS — R9430 Abnormal result of cardiovascular function study, unspecified: Secondary | ICD-10-CM | POA: Insufficient documentation

## 2011-01-17 DIAGNOSIS — I359 Nonrheumatic aortic valve disorder, unspecified: Secondary | ICD-10-CM

## 2011-01-17 DIAGNOSIS — I5189 Other ill-defined heart diseases: Secondary | ICD-10-CM

## 2011-01-17 NOTE — Assessment & Plan Note (Signed)
The patient does have sinus bradycardia.  His heart rate has been in the range of 50 in the past.  EKG today revealed a heart rate of 45.  He does not have syncope or presyncope.  I believe this is a chronic problem.  When he walked in the office his heart rate did increase to 64.  This of course is not a marked increase but there is evidence of some chronotropic competence.  He is not on any medications that would slow his heart rate.  For now we will follow this issue.

## 2011-01-17 NOTE — Assessment & Plan Note (Signed)
He does not have any significant edema.  He is stable and no change in therapy.

## 2011-01-17 NOTE — Assessment & Plan Note (Signed)
The patient's shortness of breath is chronic and stable.  I suspect it is mostly related to his myositis history.  He does have diastolic dysfunction.  Volume is controlled with diuretics.  He does have sinus bradycardia.  I do not think that chronotropic incompetence is causing this.

## 2011-01-17 NOTE — Assessment & Plan Note (Signed)
Patient will be kept on his diuretic.  No change in therapy.

## 2011-01-17 NOTE — Assessment & Plan Note (Signed)
Aortic insufficiency is mild by echo today.  No change in therapy.

## 2011-01-17 NOTE — Assessment & Plan Note (Signed)
The patient does have some right ventricular dysfunction.This is most probably related to his pulmonary issues chest cavity.  No further workup is needed.

## 2011-01-17 NOTE — Progress Notes (Signed)
HPI Patient is seen today for followup shortness of breath.  Fortunately he continues to do well.  His myositis has been treated long term.  He is not having any chest pain.  The shortness of breath he is similar to baseline.  He's not having chest pain.  He has no syncope or presyncope.   Allergies  Allergen Reactions  . Sulfonamide Derivatives     Current Outpatient Prescriptions  Medication Sig Dispense Refill  . calcium-vitamin D (CALCIUM 500+D) 500-200 MG-UNIT per tablet Take 1 tablet by mouth 2 (two) times daily.        . Cholecalciferol (VITAMIN D3) 1000 UNITS tablet Take 1,000 Units by mouth daily.        Marland Kitchen escitalopram (LEXAPRO) 10 MG tablet Take 20 mg by mouth daily.       Marland Kitchen esomeprazole (NEXIUM) 40 MG capsule Take 1 capsule (40 mg total) by mouth 2 (two) times daily.  180 capsule  3  . ferrous sulfate 325 (65 FE) MG tablet Take 325 mg by mouth daily with breakfast.        . fish oil-omega-3 fatty acids 1000 MG capsule Take one tablet by mouth two times daily       . Flaxseed, Linseed, (FLAX SEEDS PO) Take 1,200 mg by mouth daily.        . furosemide (LASIX) 80 MG tablet Take 1 tablet (80 mg total) by mouth daily.  90 tablet  3  . HYDROcodone-acetaminophen (VICODIN) 5-500 MG per tablet Take 1 tablet by mouth. Every 4-6 hours as needed       . Multiple Vitamin (MULTIVITAMIN) capsule Take 1 capsule by mouth daily.        . mycophenolate (CELLCEPT) 500 MG tablet Take 500 mg by mouth 2 (two) times daily.        . potassium chloride SA (KLOR-CON M20) 20 MEQ tablet Take 1 tablet (20 mEq total) by mouth daily. Take one tablet by mouth once daily. Take an extra tablet twice a week.  120 tablet  3  . predniSONE (DELTASONE) 1 MG tablet Take 1 mg by mouth daily.       Marland Kitchen Resource Beneprotein PACK Take 3g three times a day       . sucralfate (CARAFATE) 1 G tablet Take 1 g by mouth 3 (three) times daily.       . temazepam (RESTORIL) 30 MG capsule Take 30 mg by mouth at bedtime.        .  vitamin B-12 (CYANOCOBALAMIN) 1000 MCG tablet Take 1,000 mcg by mouth daily.        . vitamin B-12 (CYANOCOBALAMIN) 1000 MCG tablet Inject 1,000 mcg as directed every 30 (thirty) days.        Marland Kitchen zolendronic acid (ZOMETA) 4 MG/5ML injection Inject 4 mg into the vein. Every six mouths         History   Social History  . Marital Status: Widowed    Spouse Name: N/A    Number of Children: N/A  . Years of Education: N/A   Occupational History  . Not on file.   Social History Main Topics  . Smoking status: Former Smoker -- 0.5 packs/day for 31 years    Types: Cigarettes    Quit date: 06/29/1980  . Smokeless tobacco: Not on file  . Alcohol Use: Yes     rarely  . Drug Use: No  . Sexually Active: Not on file   Other Topics Concern  . Not on  file   Social History Narrative  . No narrative on file    Family History  Problem Relation Age of Onset  . Breast cancer Sister   . Colon cancer Brother   . Prostate cancer Brother   . Stroke Mother   . Emphysema Father   . Diabetes Grandchild     Past Medical History  Diagnosis Date  . CLL (chronic lymphoblastic leukemia) 2006    Rituxan treatment 2006  . Atrial septal aneurysm 2009    Insignificant, no, January, 2009  . Infection of PEG site     cellulitis.Marland KitchenResolved  . Vertebral compression fracture     multiple levels  . Herpes zoster     right chest  . Aortic insufficiency 2009    mild  . Diastolic dysfunction     mild  . Chest pain     EF 55%, echo, October, 2009, possible inferior and posterior borderline hypokinesis, patient has never  had cardiac catheterization  . Edema     EF 55%, echo, October, 2009, possible borderline inferior and posterior hypokinesis... heart catheterization has never been done(July 10, 2009)  . Myositis     Caused pulmonary insufficiency with muscle weakness in the past / Dr.Willis-neurology, Imuran and prednisone  . Calculus of kidney   . Inguinal hernia without mention of obstruction or  gangrene, unilateral or unspecified, (not specified as recurrent)     right   . Unspecified gastritis and gastroduodenitis without mention of hemorrhage   . Abdominal pain, epigastric   . Nonspecific elevation of levels of transaminase or lactic acid dehydrogenase (LDH)   . Feeding difficulties and mismanagement   . Edema of male genital organs     Resolved, occurred during illness related to myositis  . Swelling of arm     left.Marland KitchenMarland KitchenResolved.... no DVT  . LFT elevation     Related to Imuran in the past... dose was adjusted  . Dyslipidemia     statins not used due to LFT abnormalities  . Shortness of breath     Breathing abnormalities related to muscle weakness from myositis  . RBBB (right bundle branch block)     Old  . Bradycardia     Sinus bradycardia, old    Past Surgical History  Procedure Date  . Splenectomy   . Hernia repair     left side  . Peg placement 06/2007  . Peg tube removal 12/2007  . Kyphosis surgery     multilevel vertebral  . Odontoid fracture surgery     anterior screw fixation  . Nasal sinus surgery     x2  . Cataract extraction     bi lateral    ROS  Patient denies fever, chills, headache, sweats, rash, change in vision, change in hearing, chest pain, cough, nausea vomiting, urinary symptoms.  All other systems are reviewed and are negative.  PHYSICAL EXAM Patient is oriented to person time and place.  Affect is normal.  Head is atraumatic.  There is no xanthelasma.  He has significant kyphosis of the spine.  Lungs reveal mild decrease in breath sounds.  There is no rales.  There is no respiratory distress.  Cardiac exam reveals S1 and S2.  There are no clicks or significant murmurs.  Abdomen is soft.  No peripheral edema.  There no musculoskeletal deformities.  There are no skin rashes.  We walked the patient around our large office to see his heart rate response.  Heart rate increased from 47 to 64.Marland Kitchen  Filed Vitals:   01/17/11 1039  BP: 130/60  Pulse:  45  Height: 5\' 5"  (1.651 m)  Weight: 128 lb 12.8 oz (58.423 kg)    EKG is done todayPatient has old right bundle branch block.  There is sinus bradycardia that is significant at 45 beats per minute.  I reviewed the EKG and old EKGs.  There is no significant change.  ASSESSMENT & PLAN

## 2011-01-17 NOTE — Assessment & Plan Note (Signed)
Right bundle branch block is old. No change in therapy. 

## 2011-01-17 NOTE — Assessment & Plan Note (Signed)
Patient is not having any chest pain.  In the past there was some inferior hypokinesis by echo.  He had an echo today which is not yet formally read.  However I have seen the images and there are no wall motion abnormalities.  Ejection fraction is in the 60% range.

## 2011-01-17 NOTE — Assessment & Plan Note (Signed)
He does have some mitral regurgitation by echo today.  This is mild to moderate and no further workup is needed at this time.

## 2011-01-17 NOTE — Patient Instructions (Signed)
Your physician wants you to follow-up in:  6 months. You will receive a reminder letter in the mail two months in advance. If you don't receive a letter, please call our office to schedule the follow-up appointment.   

## 2011-02-10 ENCOUNTER — Ambulatory Visit: Payer: Self-pay | Admitting: Oncology

## 2011-03-12 ENCOUNTER — Ambulatory Visit: Payer: Self-pay | Admitting: Oncology

## 2011-04-12 ENCOUNTER — Ambulatory Visit: Payer: Self-pay | Admitting: Oncology

## 2011-05-02 ENCOUNTER — Ambulatory Visit: Payer: Self-pay | Admitting: Neurology

## 2011-05-13 ENCOUNTER — Ambulatory Visit: Payer: Self-pay | Admitting: Oncology

## 2011-06-10 ENCOUNTER — Ambulatory Visit: Payer: Self-pay | Admitting: Oncology

## 2011-06-24 LAB — CBC CANCER CENTER
Basophil #: 0.1 x10 3/mm (ref 0.0–0.1)
Basophil %: 0.7 %
Eosinophil #: 0.1 x10 3/mm (ref 0.0–0.7)
HCT: 38.8 % — ABNORMAL LOW (ref 40.0–52.0)
Lymphocyte #: 5 x10 3/mm — ABNORMAL HIGH (ref 1.0–3.6)
MCH: 31.9 pg (ref 26.0–34.0)
MCHC: 32 g/dL (ref 32.0–36.0)
MCV: 99.8 fL (ref 80–100)
Monocyte #: 1.6 x10 3/mm — ABNORMAL HIGH (ref 0.0–0.7)
Monocyte %: 18.6 %
Platelet: 202 x10 3/mm (ref 150–440)
RDW: 16 % — ABNORMAL HIGH (ref 11.5–14.5)
WBC: 8.4 x10 3/mm (ref 3.8–10.6)

## 2011-06-24 LAB — COMPREHENSIVE METABOLIC PANEL
Albumin: 3.6 g/dL (ref 3.4–5.0)
Alkaline Phosphatase: 85 U/L (ref 50–136)
BUN: 17 mg/dL (ref 7–18)
Bilirubin,Total: 0.7 mg/dL (ref 0.2–1.0)
Calcium, Total: 9.1 mg/dL (ref 8.5–10.1)
Creatinine: 0.97 mg/dL (ref 0.60–1.30)
EGFR (African American): >60
Glucose: 94 mg/dL (ref 65–99)
SGOT(AST): 27 U/L (ref 15–37)
SGPT (ALT): 22 U/L
Total Protein: 6.5 g/dL (ref 6.4–8.2)

## 2011-07-11 ENCOUNTER — Ambulatory Visit: Payer: Self-pay | Admitting: Oncology

## 2011-07-22 ENCOUNTER — Encounter: Payer: Self-pay | Admitting: Cardiology

## 2011-07-22 ENCOUNTER — Ambulatory Visit (INDEPENDENT_AMBULATORY_CARE_PROVIDER_SITE_OTHER): Payer: Medicare Other | Admitting: Cardiology

## 2011-07-22 VITALS — BP 112/56 | HR 58 | Ht 65.0 in | Wt 125.8 lb

## 2011-07-22 DIAGNOSIS — R001 Bradycardia, unspecified: Secondary | ICD-10-CM

## 2011-07-22 DIAGNOSIS — I498 Other specified cardiac arrhythmias: Secondary | ICD-10-CM

## 2011-07-22 DIAGNOSIS — R0602 Shortness of breath: Secondary | ICD-10-CM

## 2011-07-22 DIAGNOSIS — I5189 Other ill-defined heart diseases: Secondary | ICD-10-CM

## 2011-07-22 DIAGNOSIS — I519 Heart disease, unspecified: Secondary | ICD-10-CM

## 2011-07-22 MED ORDER — POTASSIUM CHLORIDE CRYS ER 20 MEQ PO TBCR: 20.0000 meq | EXTENDED_RELEASE_TABLET | Freq: Every day | ORAL | Status: DC

## 2011-07-22 MED ORDER — FUROSEMIDE 80 MG PO TABS: 80.0000 mg | ORAL_TABLET | Freq: Every day | ORAL | Status: DC

## 2011-07-22 MED ORDER — ESOMEPRAZOLE MAGNESIUM 40 MG PO CPDR: 40.0000 mg | DELAYED_RELEASE_CAPSULE | Freq: Two times a day (BID) | ORAL | Status: DC

## 2011-07-22 NOTE — Progress Notes (Signed)
HPI This very pleasant gentleman is seen for cardiology followup. I saw him last October, 2012. He does have some shortness of breath. This is stable at this time.  He has not had chest pain. There is no syncope or presyncope. 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 He is now off prednisone. There was question of some type of change in his vision. He had a complete workup by neurology and he tells me that there was no marked abnormality found. He was started on 81 mg of aspirin.  Allergies  Allergen Reactions  . Sulfonamide Derivatives     Current Outpatient Prescriptions  Medication Sig Dispense Refill  . aspirin 81 MG tablet Take 81 mg by mouth daily.      . calcium-vitamin D (CALCIUM 500+D) 500-200 MG-UNIT per tablet Take 1 tablet by mouth 2 (two) times daily.        . Cholecalciferol (VITAMIN D3) 1000 UNITS tablet Take 1,000 Units by mouth daily.        Marland Kitchen escitalopram (LEXAPRO) 10 MG tablet Take 20 mg by mouth daily.       Marland Kitchen esomeprazole (NEXIUM) 40 MG capsule Take 1 capsule (40 mg total) by mouth 2 (two) times daily.  180 capsule  3  . ferrous sulfate 325 (65 FE) MG tablet Take 325 mg by mouth daily with breakfast.        . fish oil-omega-3 fatty acids 1000 MG capsule Take one tablet by mouth two times daily       . Flaxseed, Linseed, (FLAX SEEDS PO) Take 1,200 mg by mouth daily.        . furosemide (LASIX) 80 MG tablet Take 1 tablet (80 mg total) by mouth daily.  90 tablet  3  . HYDROcodone-acetaminophen (VICODIN) 5-500 MG per tablet Take 1 tablet by mouth. Every 4-6 hours as needed       . Multiple Vitamin (MULTIVITAMIN) capsule Take 1 capsule by mouth daily.        . mycophenolate (CELLCEPT) 500 MG tablet Take 500 mg by mouth 2 (two) times daily.        . potassium chloride SA (KLOR-CON M20) 20 MEQ tablet Take 1 tablet (20 mEq total) by mouth daily. Take one tablet by mouth once daily. Take an extra tablet twice a week.  120 tablet  3  . Resource Beneprotein PACK Take  3g three times a day       . sucralfate (CARAFATE) 1 G tablet Take 1 g by mouth 3 (three) times daily.       . temazepam (RESTORIL) 30 MG capsule Take 30 mg by mouth at bedtime.        . vitamin B-12 (CYANOCOBALAMIN) 1000 MCG tablet Take 1,000 mcg by mouth daily.        . vitamin B-12 (CYANOCOBALAMIN) 1000 MCG tablet Inject 1,000 mcg as directed every 30 (thirty) days.        Marland Kitchen zolendronic acid (ZOMETA) 4 MG/5ML injection Inject 4 mg into the vein. Every six mouths         History   Social History  . Marital Status: Widowed    Spouse Name: N/A    Number of Children: N/A  . Years of Education: N/A   Occupational History  . Not on file.   Social History Main Topics  . Smoking status: Former Smoker -- 0.5 packs/day for 31 years    Types: Cigarettes    Quit date: 06/29/1980  . Smokeless  tobacco: Not on file  . Alcohol Use: Yes     rarely  . Drug Use: No  . Sexually Active: Not on file   Other Topics Concern  . Not on file   Social History Narrative  . No narrative on file    Family History  Problem Relation Age of Onset  . Breast cancer Sister   . Colon cancer Brother   . Prostate cancer Brother   . Stroke Mother   . Emphysema Father   . Diabetes Grandchild     Past Medical History  Diagnosis Date  . CLL (chronic lymphoblastic leukemia) 2006    Rituxan treatment 2006  . Atrial septal aneurysm 2009    Insignificant, no, January, 2009  . Infection of PEG site     cellulitis.Marland KitchenResolved  . Vertebral compression fracture     multiple levels  . Herpes zoster     right chest  . Aortic insufficiency 2009    mild, Mild, echo, 2012  . Diastolic dysfunction     mild  . Chest pain     EF 55%, echo, October, 2009, possible inferior and posterior borderline hypokinesis, patient has never  had cardiac catheterization  . Edema     EF 55%, echo, October, 2009, possible borderline inferior and posterior hypokinesis... heart catheterization has never been done(July 10, 2009)    . Myositis     Caused pulmonary insufficiency with muscle weakness in the past / Dr.Willis-neurology, Imuran and prednisone  . Calculus of kidney   . Inguinal hernia without mention of obstruction or gangrene, unilateral or unspecified, (not specified as recurrent)     right   . Unspecified gastritis and gastroduodenitis without mention of hemorrhage   . Abdominal pain, epigastric   . Nonspecific elevation of levels of transaminase or lactic acid dehydrogenase (LDH)   . Feeding difficulties and mismanagement   . Edema of male genital organs     Resolved, occurred during illness related to myositis  . Swelling of arm     left.Marland KitchenMarland KitchenResolved.... no DVT  . LFT elevation     Related to Imuran in the past... dose was adjusted  . Dyslipidemia     statins not used due to LFT abnormalities  . Shortness of breath     Breathing abnormalities related to muscle weakness from myositis  . RBBB (right bundle branch block)     Old  . Bradycardia     Sinus bradycardia, old  . Ejection fraction     EF 60%, echo, October, 2012  . Mitral regurgitation     Mild / moderate, echo, 2012  . Right ventricular dysfunction     Mild, echo, 2012    Past Surgical History  Procedure Date  . Splenectomy   . Hernia repair     left side  . Peg placement 06/2007  . Peg tube removal 12/2007  . Kyphosis surgery     multilevel vertebral  . Odontoid fracture surgery     anterior screw fixation  . Nasal sinus surgery     x2  . Cataract extraction     bi lateral    ROS  Patient denies fever, chills, headache, sweats, rash, change in hearing, cough, chest pain, nausea vomiting, urinary symptoms. All other systems are reviewed and are negative.  PHYSICAL EXAM Patient has kyphosis of the spine. He is quite stable. There is no jugulovenous distention. Lungs are clear. Respiratory effort is nonlabored. Cardiac exam reveals S1 and S2. There no clicks. There  no significant murmurs. The abdomen is soft. There is no  peripheral edema.  Filed Vitals:   07/22/11 1127  BP: 112/56  Pulse: 58  Height: 5\' 5"  (1.651 m)  Weight: 125 lb 12.8 oz (57.063 kg)     ASSESSMENT & PLAN

## 2011-07-22 NOTE — Assessment & Plan Note (Signed)
The patient's shortness of breath is really mild at this time. It was felt that his myositis was the primary issue. He does have some diastolic dysfunction. He remains on Lasix for this. His is to be continued. I will check to see when he said recent labs.

## 2011-07-22 NOTE — Assessment & Plan Note (Signed)
Diuretics are being continued for this. We'll check his chemistry. He is now off in his own. If he's had any major change in his renal function we may decrease his diuretic.

## 2011-07-22 NOTE — Assessment & Plan Note (Signed)
Resting heart rate today is 58. No change in therapy.

## 2011-07-22 NOTE — Patient Instructions (Signed)
Your physician wants you to follow-up in: 9 months You will receive a reminder letter in the mail two months in advance. If you don't receive a letter, please call our office to schedule the follow-up appointment.  

## 2011-08-15 ENCOUNTER — Encounter: Payer: Self-pay | Admitting: Cardiology

## 2011-08-15 ENCOUNTER — Telehealth: Payer: Self-pay | Admitting: Cardiology

## 2011-08-15 ENCOUNTER — Ambulatory Visit: Payer: Self-pay | Admitting: Family Medicine

## 2011-08-15 LAB — CBC WITH DIFFERENTIAL/PLATELET
Basophil: 1 %
HGB: 12.4 g/dL — ABNORMAL LOW (ref 13.0–18.0)
Lymphocytes: 43 %
MCH: 33 pg (ref 26.0–34.0)
Platelet: 208 10*3/uL (ref 150–440)
RDW: 15.7 % — ABNORMAL HIGH (ref 11.5–14.5)

## 2011-08-15 NOTE — Telephone Encounter (Signed)
Dr Elizabeth Sauer, from [facility] called requesting that [name] [name] be seen this week by Dr Myrtis Ser.  She is seeing him today and states he is having increased sob past couple days.  She states he has decreased breath sounds lower 2/3 of his lungs (bil).  No JVD and no orthopnea.  She is concerned because he normally doesn't complain and he is cyanotic.  O2Sat is 95%.  She is ordering a cbc and cxr today and will fax the results.

## 2011-08-15 NOTE — Telephone Encounter (Signed)
New problem:  Per Delice Bison calling from [facility]. Patient was seen today. C/O recent sob. Cbc, chest xray would be done today.

## 2011-08-17 NOTE — Telephone Encounter (Signed)
CXR and labs faxed and given to Dr Myrtis Ser

## 2011-08-18 ENCOUNTER — Encounter: Payer: Self-pay | Admitting: Cardiology

## 2011-08-18 ENCOUNTER — Ambulatory Visit (INDEPENDENT_AMBULATORY_CARE_PROVIDER_SITE_OTHER): Payer: Medicare Other | Admitting: Cardiology

## 2011-08-18 VITALS — BP 100/50 | HR 64 | Ht 65.0 in | Wt 128.0 lb

## 2011-08-18 DIAGNOSIS — R509 Fever, unspecified: Secondary | ICD-10-CM | POA: Insufficient documentation

## 2011-08-18 DIAGNOSIS — I059 Rheumatic mitral valve disease, unspecified: Secondary | ICD-10-CM

## 2011-08-18 DIAGNOSIS — R609 Edema, unspecified: Secondary | ICD-10-CM

## 2011-08-18 DIAGNOSIS — I5189 Other ill-defined heart diseases: Secondary | ICD-10-CM

## 2011-08-18 DIAGNOSIS — I498 Other specified cardiac arrhythmias: Secondary | ICD-10-CM

## 2011-08-18 DIAGNOSIS — R0602 Shortness of breath: Secondary | ICD-10-CM

## 2011-08-18 DIAGNOSIS — I519 Heart disease, unspecified: Secondary | ICD-10-CM

## 2011-08-18 DIAGNOSIS — R001 Bradycardia, unspecified: Secondary | ICD-10-CM

## 2011-08-18 DIAGNOSIS — M609 Myositis, unspecified: Secondary | ICD-10-CM

## 2011-08-18 DIAGNOSIS — IMO0001 Reserved for inherently not codable concepts without codable children: Secondary | ICD-10-CM

## 2011-08-18 DIAGNOSIS — I340 Nonrheumatic mitral (valve) insufficiency: Secondary | ICD-10-CM

## 2011-08-18 DIAGNOSIS — R079 Chest pain, unspecified: Secondary | ICD-10-CM

## 2011-08-18 LAB — BASIC METABOLIC PANEL
Calcium: 8.9 mg/dL (ref 8.4–10.5)
GFR: 86.38 mL/min (ref >60.00)
Glucose, Bld: 98 mg/dL (ref 70–99)
Potassium: 4 mEq/L (ref 3.5–5.1)
Sodium: 137 mEq/L (ref 135–145)

## 2011-08-18 LAB — HEPATIC FUNCTION PANEL
ALT: 15 U/L (ref 0–53)
AST: 25 U/L (ref 0–37)
Albumin: 3.7 g/dL (ref 3.5–5.2)
Total Protein: 6.5 g/dL (ref 6.0–8.3)

## 2011-08-18 NOTE — Assessment & Plan Note (Signed)
The patient has had a recent febrile illness. It is possible that it may be sinusitis. He is on Levaquin. I mentioned to the family that consideration might be given to a ten-day her 14 day course.

## 2011-08-18 NOTE — Assessment & Plan Note (Signed)
There is a history of CLL. However the recent bloods do not suggest any marked change in his white count.

## 2011-08-18 NOTE — Progress Notes (Signed)
HPI  The patient is here for the evaluation of shortness of breath. He was seen by his primary physician who is managed him very carefully. She asked that I see him for further cardiac evaluation. He has had some exertional shortness of breath. He has not had PND or orthopnea. He was also having some problems with a sinus infection. He received some ampicillin. Following this he's been receiving some Levaquin. He did have fever with this which is now resolved after 4 days of Levaquin. He has also had some myalgias. There was question that he could have some respiratory difficulties. His O2 sat in the primary care office was 96 and it is 95 today. It is of note that he has had myositis in the past that is followed very carefully by Dr. Anne Hahn.  The patient had a chest x-ray that showed no acute infiltrates. There was no evidence of CHF. His hemoglobin was 12.4. White blood count was high normal at 10,500. The patient's last ejection fraction was 60% by echo in October, 2012. He did have mild to moderate mitral regurgitation.  As part of the evaluation today I have spent greater than one hour here in the office. In addition I have reviewed outside records including his recent labs and records from his primary physician. I have reviewed all of my recent records. I have also called to arrange for him to have early neurology followup.     Current Outpatient Prescriptions  Medication Sig Dispense Refill  . aspirin 81 MG tablet Take 81 mg by mouth daily.      . calcium-vitamin D (CALCIUM 500+D) 500-200 MG-UNIT per tablet Take 1 tablet by mouth 2 (two) times daily.        . Cholecalciferol (VITAMIN D3) 1000 UNITS tablet Take 1,000 Units by mouth daily.        Marland Kitchen escitalopram (LEXAPRO) 10 MG tablet Take 20 mg by mouth daily.       Marland Kitchen esomeprazole (NEXIUM) 40 MG capsule Take 1 capsule (40 mg total) by mouth 2 (two) times daily.  180 capsule  3  . ferrous sulfate 325 (65 FE) MG tablet Take 325 mg by mouth  daily with breakfast.        . fish oil-omega-3 fatty acids 1000 MG capsule Take one tablet by mouth two times daily       . Flaxseed, Linseed, (FLAX SEEDS PO) Take 1,200 mg by mouth daily.        . furosemide (LASIX) 80 MG tablet Take 1 tablet (80 mg total) by mouth daily.  90 tablet  3  . HYDROcodone-acetaminophen (VICODIN) 5-500 MG per tablet Take 1 tablet by mouth. Every 4-6 hours as needed       . Multiple Vitamin (MULTIVITAMIN) capsule Take 1 capsule by mouth daily.        . mycophenolate (CELLCEPT) 500 MG tablet Take 500 mg by mouth 2 (two) times daily.        . potassium chloride SA (KLOR-CON M20) 20 MEQ tablet Take 1 tablet (20 mEq total) by mouth daily. Take one tablet by mouth once daily. Take an extra tablet twice a week.  120 tablet  3  . Resource Beneprotein PACK Take 3g three times a day       . sucralfate (CARAFATE) 1 G tablet Take 1 g by mouth 3 (three) times daily.       . temazepam (RESTORIL) 30 MG capsule Take 30 mg by mouth at bedtime.        Marland Kitchen  vitamin B-12 (CYANOCOBALAMIN) 1000 MCG tablet Take 1,000 mcg by mouth daily.        . vitamin B-12 (CYANOCOBALAMIN) 1000 MCG tablet Inject 1,000 mcg as directed every 30 (thirty) days.        Marland Kitchen zolendronic acid (ZOMETA) 4 MG/5ML injection Inject 4 mg into the vein. Every six mouths         History   Social History  . Marital Status: Widowed    Spouse Name: N/A    Number of Children: N/A  . Years of Education: N/A   Occupational History  . Not on file.   Social History Main Topics  . Smoking status: Former Smoker -- 0.5 packs/day for 31 years    Types: Cigarettes    Quit date: 06/29/1980  . Smokeless tobacco: Not on file  . Alcohol Use: Yes     rarely  . Drug Use: No  . Sexually Active: Not on file   Other Topics Concern  . Not on file   Social History Narrative  . No narrative on file    Family History  Problem Relation Age of Onset  . Breast cancer Sister   . Colon cancer Brother   . Prostate cancer Brother    . Stroke Mother   . Emphysema Father   . Diabetes Grandchild     Past Medical History  Diagnosis Date  . CLL (chronic lymphoblastic leukemia) 2006    Rituxan treatment 2006  . Atrial septal aneurysm 2009    Insignificant, no, January, 2009  . Infection of PEG site     cellulitis.Marland KitchenResolved  . Vertebral compression fracture     multiple levels  . Herpes zoster     right chest  . Aortic insufficiency 2009    mild, Mild, echo, 2012  . Diastolic dysfunction     mild  . Chest pain     EF 55%, echo, October, 2009, possible inferior and posterior borderline hypokinesis, patient has never  had cardiac catheterization  . Edema     EF 55%, echo, October, 2009, possible borderline inferior and posterior hypokinesis... heart catheterization has never been done(July 10, 2009)  . Myositis     Caused pulmonary insufficiency with muscle weakness in the past / Dr.Willis-neurology, Imuran and prednisone  . Calculus of kidney   . Inguinal hernia without mention of obstruction or gangrene, unilateral or unspecified, (not specified as recurrent)     right   . Unspecified gastritis and gastroduodenitis without mention of hemorrhage   . Abdominal pain, epigastric   . Nonspecific elevation of levels of transaminase or lactic acid dehydrogenase (LDH)   . Feeding difficulties and mismanagement   . Edema of male genital organs     Resolved, occurred during illness related to myositis  . Swelling of arm     left.Marland KitchenMarland KitchenResolved.... no DVT  . LFT elevation     Related to Imuran in the past... dose was adjusted  . Dyslipidemia     statins not used due to LFT abnormalities  . Shortness of breath     Breathing abnormalities related to muscle weakness from myositis  . RBBB (right bundle branch block)     Old  . Bradycardia     Sinus bradycardia, old  . Ejection fraction     EF 60%, echo, October, 2012  . Mitral regurgitation     Mild / moderate, echo, 2012  . Right ventricular dysfunction     Mild,  echo, 2012    Past Surgical  History  Procedure Date  . Splenectomy   . Hernia repair     left side  . Peg placement 06/2007  . Peg tube removal 12/2007  . Kyphosis surgery     multilevel vertebral  . Odontoid fracture surgery     anterior screw fixation  . Nasal sinus surgery     x2  . Cataract extraction     bi lateral    ROS The patient denies chills. He has had some fever that is now resolved. He had some mild discomfort in the frontal sinuses. He has not had a cough.  He has no PND or orthopnea. He's not had any change in hearing or vision. There's been no GI symptoms and no urinary symptoms. All other systems are reviewed and are negative.  PHYSICAL EXAM The patient is here with his 2 daughters. He is oriented to person time and place. Affect is normal. There is no jugulovenous distention. Lung sounds are distant. No definite rales are heard. Cardiac exam reveals S1 and S2. There no clicks or significant murmurs. Abdomen is soft. There is no significant peripheral edema. He does have kyphosis of the spine. There are no skin rashes.  Filed Vitals:   08/18/11 1417  BP: 100/50  Pulse: 64  Height: 5\' 5"  (1.651 m)  Weight: 128 lb (58.06 kg)   EKG is done today and reviewed by me. There is sinus rhythm with mild sinus bradycardia. There is old right bundle branch block.  ASSESSMENT & PLAN

## 2011-08-18 NOTE — Assessment & Plan Note (Signed)
Patient has had some mild bradycardia. This is not clinically significant.

## 2011-08-18 NOTE — Assessment & Plan Note (Signed)
The patient has not been having any significant chest pain. No further workup. 

## 2011-08-18 NOTE — Assessment & Plan Note (Addendum)
The patient has recently had exertional shortness of breath. At this point I believe this is not from heart failure. Originally he had shortness of breath with myositis. We have been in contact with his neurologist and he has an early appointment to be seen to determine if this could be playing a role in his current problem. I decided to recommended followup 2-D echo. I did not make this clear when he was here but we will be in touch with him and make the arrangements. I will then see him back for cardiology followup.

## 2011-08-18 NOTE — Assessment & Plan Note (Signed)
Patient has mild valvular heart disease. I feel that this is not significantly worse clinically at this time.

## 2011-08-18 NOTE — Assessment & Plan Note (Signed)
He does not have any significant edema at this time.  No change in therapy. 

## 2011-08-18 NOTE — Assessment & Plan Note (Signed)
Historically the patient has had some diastolic dysfunction. He is on a diuretic. His volume status is stable.

## 2011-08-18 NOTE — Patient Instructions (Addendum)
Your physician recommends that you schedule a follow-up appointment in: 4 weeks with Dr Myrtis Ser  Your physician recommends that you schedule a follow-up appointment in: on 08/22/11 at 8:45am with Dr Anne Hahn  Your physician recommends that you return for lab work in: today (BMET and liver)

## 2011-08-18 NOTE — Assessment & Plan Note (Signed)
The patient has a history of significant myositis in the past. This caused pulmonary insufficiency and muscle weakness. He did not mention myalgias to me today but I did not specifically ask him this question. I see now that there was question of myalgias in the report from primary care.

## 2011-09-14 ENCOUNTER — Ambulatory Visit (INDEPENDENT_AMBULATORY_CARE_PROVIDER_SITE_OTHER): Payer: Medicare Other | Admitting: Cardiology

## 2011-09-14 ENCOUNTER — Encounter: Payer: Self-pay | Admitting: Cardiology

## 2011-09-14 VITALS — BP 110/68 | HR 60 | Ht 65.0 in | Wt 124.0 lb

## 2011-09-14 DIAGNOSIS — I5189 Other ill-defined heart diseases: Secondary | ICD-10-CM

## 2011-09-14 DIAGNOSIS — IMO0001 Reserved for inherently not codable concepts without codable children: Secondary | ICD-10-CM

## 2011-09-14 DIAGNOSIS — R0602 Shortness of breath: Secondary | ICD-10-CM

## 2011-09-14 DIAGNOSIS — R509 Fever, unspecified: Secondary | ICD-10-CM

## 2011-09-14 DIAGNOSIS — M609 Myositis, unspecified: Secondary | ICD-10-CM

## 2011-09-14 DIAGNOSIS — I519 Heart disease, unspecified: Secondary | ICD-10-CM

## 2011-09-14 NOTE — Progress Notes (Signed)
HPI Patient returns for cardiology followup. I saw him last in the office Aug 18, 2011. The picture was confusing at that time. He was having some shortness of breath. I was not convinced that he had heart failure. With his history of myositis I was worried that this might be playing a role. Since that time he has seen his neurologist who felt that his myositis was quiet. The patient had a sinus infection and we thought that possibly he was just having delayed function related to this. It appears this was most likely the case. His antibiotics are completed and he is feeling better. I had considered a 2-D echo after he left but this was not arranged and it is not necessary at this time.  Allergies  Allergen Reactions  . Sulfonamide Derivatives     Current Outpatient Prescriptions  Medication Sig Dispense Refill  . aspirin 81 MG tablet Take 81 mg by mouth daily.      . calcium-vitamin D (CALCIUM 500+D) 500-200 MG-UNIT per tablet Take 1 tablet by mouth 2 (two) times daily.        . Cholecalciferol (VITAMIN D3) 1000 UNITS tablet Take 1,000 Units by mouth daily.        Marland Kitchen escitalopram (LEXAPRO) 10 MG tablet Take 20 mg by mouth daily.       Marland Kitchen esomeprazole (NEXIUM) 40 MG capsule Take 1 capsule (40 mg total) by mouth 2 (two) times daily.  180 capsule  3  . ferrous sulfate 325 (65 FE) MG tablet Take 325 mg by mouth daily with breakfast.        . fish oil-omega-3 fatty acids 1000 MG capsule Take one tablet by mouth two times daily       . Flaxseed, Linseed, (FLAX SEEDS PO) Take 1,200 mg by mouth daily.        . furosemide (LASIX) 80 MG tablet Take 1 tablet (80 mg total) by mouth daily.  90 tablet  3  . HYDROcodone-acetaminophen (VICODIN) 5-500 MG per tablet Take 1 tablet by mouth. Every 4-6 hours as needed       . Multiple Vitamin (MULTIVITAMIN) capsule Take 1 capsule by mouth daily.        . mycophenolate (CELLCEPT) 500 MG tablet Take 500 mg by mouth 2 (two) times daily.        . potassium chloride  SA (KLOR-CON M20) 20 MEQ tablet Take 1 tablet (20 mEq total) by mouth daily. Take one tablet by mouth once daily. Take an extra tablet twice a week.  120 tablet  3  . Resource Beneprotein PACK Take 3g three times a day       . sucralfate (CARAFATE) 1 G tablet Take 1 g by mouth 3 (three) times daily.       . temazepam (RESTORIL) 30 MG capsule Take 30 mg by mouth at bedtime.        . vitamin B-12 (CYANOCOBALAMIN) 1000 MCG tablet Take 1,000 mcg by mouth daily.        . vitamin B-12 (CYANOCOBALAMIN) 1000 MCG tablet Inject 1,000 mcg as directed every 30 (thirty) days.        Marland Kitchen zolendronic acid (ZOMETA) 4 MG/5ML injection Inject 4 mg into the vein. yearly        History   Social History  . Marital Status: Widowed    Spouse Name: N/A    Number of Children: N/A  . Years of Education: N/A   Occupational History  . Not on  file.   Social History Main Topics  . Smoking status: Former Smoker -- 0.5 packs/day for 31 years    Types: Cigarettes    Quit date: 06/29/1980  . Smokeless tobacco: Not on file  . Alcohol Use: Yes     rarely  . Drug Use: No  . Sexually Active: Not on file   Other Topics Concern  . Not on file   Social History Narrative  . No narrative on file    Family History  Problem Relation Age of Onset  . Breast cancer Sister   . Colon cancer Brother   . Prostate cancer Brother   . Stroke Mother   . Emphysema Father   . Diabetes Grandchild     Past Medical History  Diagnosis Date  . CLL (chronic lymphoblastic leukemia) 2006    Rituxan treatment 2006  . Atrial septal aneurysm 2009    Insignificant, no, January, 2009  . Infection of PEG site     cellulitis.Marland KitchenResolved  . Vertebral compression fracture     multiple levels  . Herpes zoster     right chest  . Aortic insufficiency 2009    mild, Mild, echo, 2012  . Diastolic dysfunction     mild  . Chest pain     EF 55%, echo, October, 2009, possible inferior and posterior borderline hypokinesis, patient has never   had cardiac catheterization  . Edema     EF 55%, echo, October, 2009, possible borderline inferior and posterior hypokinesis... heart catheterization has never been done(July 10, 2009)  . Myositis     Caused pulmonary insufficiency with muscle weakness in the past / Dr.Willis-neurology, Imuran and prednisone  . Calculus of kidney   . Inguinal hernia without mention of obstruction or gangrene, unilateral or unspecified, (not specified as recurrent)     right   . Unspecified gastritis and gastroduodenitis without mention of hemorrhage   . Abdominal pain, epigastric   . Nonspecific elevation of levels of transaminase or lactic acid dehydrogenase (LDH)   . Feeding difficulties and mismanagement   . Edema of male genital organs     Resolved, occurred during illness related to myositis  . Swelling of arm     left.Marland KitchenMarland KitchenResolved.... no DVT  . LFT elevation     Related to Imuran in the past... dose was adjusted  . Dyslipidemia     statins not used due to LFT abnormalities  . Shortness of breath     Breathing abnormalities related to muscle weakness from myositis  . RBBB (right bundle branch block)     Old  . Bradycardia     Sinus bradycardia, old  . Ejection fraction     EF 60%, echo, October, 2012  . Mitral regurgitation     Mild / moderate, echo, 2012  . Right ventricular dysfunction     Mild, echo, 2012  . Febrile illness     May, 2013, question sinusitis    Past Surgical History  Procedure Date  . Splenectomy   . Hernia repair     left side  . Peg placement 06/2007  . Peg tube removal 12/2007  . Kyphosis surgery     multilevel vertebral  . Odontoid fracture surgery     anterior screw fixation  . Nasal sinus surgery     x2  . Cataract extraction     bi lateral    ROS Patient denies fever, chills, headache, sweats, rash, change in vision, change in hearing, chest pain, cough, nausea  vomiting, urinary symptoms. All other systems are reviewed and are negative.  PHYSICAL  EXAM  He looks good. He is thin. He has kyphosis of the spine. Otherwise he stable. He is oriented to person time and place. Affect is normal. There is no jugular venous distention. There are few scattered rhonchi. There is no respiratory distress. Cardiac exam reveals S1 and S2. There no significant murmurs. The abdomen is soft. There is no peripheral edema.  Filed Vitals:   09/14/11 1355  BP: 110/68  Pulse: 60  Height: 5\' 5"  (1.651 m)  Weight: 124 lb (56.246 kg)    EKG  ASSESSMENT & PLAN

## 2011-09-14 NOTE — Assessment & Plan Note (Signed)
Shortness of breath is better. He needs no further workup. I will plan to see him back in 3 months to be sure that he is remaining stable.

## 2011-09-14 NOTE — Assessment & Plan Note (Signed)
We know that he has some diastolic dysfunction. He is on a diuretic and his volume status is stable. No change.

## 2011-09-14 NOTE — Assessment & Plan Note (Signed)
It is felt that his myositis is not active at this time.

## 2011-09-14 NOTE — Patient Instructions (Signed)
Your physician recommends that you schedule a follow-up appointment in: 3 months.  

## 2011-09-14 NOTE — Assessment & Plan Note (Signed)
It appears that his febrile illness is improved after antibiotics. It is possible it was sinusitis and this made him feel poorly with some shortness of breath. He is greatly improved.

## 2011-12-22 ENCOUNTER — Ambulatory Visit (INDEPENDENT_AMBULATORY_CARE_PROVIDER_SITE_OTHER): Payer: Medicare Other | Admitting: Cardiology

## 2011-12-22 ENCOUNTER — Encounter: Payer: Self-pay | Admitting: Cardiology

## 2011-12-22 VITALS — BP 110/62 | HR 40 | Resp 17 | Ht 65.0 in | Wt 128.0 lb

## 2011-12-22 DIAGNOSIS — R0602 Shortness of breath: Secondary | ICD-10-CM

## 2011-12-22 DIAGNOSIS — I519 Heart disease, unspecified: Secondary | ICD-10-CM

## 2011-12-22 DIAGNOSIS — I5189 Other ill-defined heart diseases: Secondary | ICD-10-CM

## 2011-12-22 NOTE — Patient Instructions (Addendum)
Your physician wants you to follow-up in: 1 year. You will receive a reminder letter in the mail two months in advance. If you don't receive a letter, please call our office to schedule the follow-up appointment.  

## 2011-12-22 NOTE — Assessment & Plan Note (Signed)
He has some mild diastolic dysfunction. This is Quite stable with  Lasix. His renal function when checked in the past has been stable. No further workup. I'll see him back in one year.

## 2011-12-22 NOTE — Assessment & Plan Note (Signed)
Shortness of breath is stable. No further workup.

## 2011-12-22 NOTE — Progress Notes (Signed)
HPI  Patient is seen back today to followup his shortness of breath. I had seen him in May and we were concerned about some shortness of breath. The situation was confusing. Ultimately it appeared that this was related to some type of sinus infection. This was treated and improved. He did not have any evidence of ongoing myositis when seen in followup by his neurologist. He was stable when I saw him in June, 2013. Today's visit is a 3 month followup to sure that he remains stable. He's doing well.  Allergies  Allergen Reactions  . Sulfonamide Derivatives     Current Outpatient Prescriptions  Medication Sig Dispense Refill  . aspirin 81 MG tablet Take 81 mg by mouth daily.      . calcium-vitamin D (CALCIUM 500+D) 500-200 MG-UNIT per tablet Take 1 tablet by mouth 2 (two) times daily.        . Cholecalciferol (VITAMIN D3) 1000 UNITS tablet Take 1,000 Units by mouth daily.        . citalopram (CELEXA) 40 MG tablet       . dicyclomine (BENTYL) 10 MG capsule       . esomeprazole (NEXIUM) 40 MG capsule Take 1 capsule (40 mg total) by mouth 2 (two) times daily.  180 capsule  3  . ferrous sulfate 325 (65 FE) MG tablet Take 325 mg by mouth daily with breakfast.        . fish oil-omega-3 fatty acids 1000 MG capsule Take 1 g by mouth daily. Take one tablet by mouth two times daily      . Flaxseed, Linseed, (FLAX SEEDS PO) Take 1,200 mg by mouth daily.        . furosemide (LASIX) 80 MG tablet Take 1 tablet (80 mg total) by mouth daily.  90 tablet  3  . Multiple Vitamin (MULTIVITAMIN) capsule Take 1 capsule by mouth daily.        . mycophenolate (CELLCEPT) 500 MG tablet Take 500 mg by mouth 2 (two) times daily.        . potassium chloride SA (KLOR-CON M20) 20 MEQ tablet Take 1 tablet (20 mEq total) by mouth daily. Take one tablet by mouth once daily. Take an extra tablet twice a week.  120 tablet  3  . Resource Beneprotein PACK Take 3g three times a day       . sucralfate (CARAFATE) 1 G tablet Take 1 g  by mouth 3 (three) times daily.       . temazepam (RESTORIL) 30 MG capsule Take 30 mg by mouth at bedtime.        . vitamin B-12 (CYANOCOBALAMIN) 1000 MCG tablet Take 1,000 mcg by mouth daily.        . vitamin B-12 (CYANOCOBALAMIN) 1000 MCG tablet Inject 1,000 mcg as directed every 30 (thirty) days.        Marland Kitchen zolendronic acid (ZOMETA) 4 MG/5ML injection Inject 4 mg into the vein. yearly      . HYDROcodone-acetaminophen (VICODIN) 5-500 MG per tablet Take 1 tablet by mouth. Every 4-6 hours as needed         History   Social History  . Marital Status: Widowed    Spouse Name: N/A    Number of Children: N/A  . Years of Education: N/A   Occupational History  . Not on file.   Social History Main Topics  . Smoking status: Former Smoker -- 0.5 packs/day for 31 years    Types: Cigarettes  Quit date: 06/29/1980  . Smokeless tobacco: Not on file  . Alcohol Use: Yes     rarely  . Drug Use: No  . Sexually Active: Not on file   Other Topics Concern  . Not on file   Social History Narrative  . No narrative on file    Family History  Problem Relation Age of Onset  . Breast cancer Sister   . Colon cancer Brother   . Prostate cancer Brother   . Stroke Mother   . Emphysema Father   . Diabetes Grandchild     Past Medical History  Diagnosis Date  . CLL (chronic lymphoblastic leukemia) 2006    Rituxan treatment 2006  . Atrial septal aneurysm 2009    Insignificant, no, January, 2009  . Infection of PEG site     cellulitis.Marland KitchenResolved  . Vertebral compression fracture     multiple levels  . Herpes zoster     right chest  . Aortic insufficiency 2009    mild, Mild, echo, 2012  . Diastolic dysfunction     mild  . Chest pain     EF 55%, echo, October, 2009, possible inferior and posterior borderline hypokinesis, patient has never  had cardiac catheterization  . Edema     EF 55%, echo, October, 2009, possible borderline inferior and posterior hypokinesis... heart catheterization has  never been done(July 10, 2009)  . Myositis     Caused pulmonary insufficiency with muscle weakness in the past / Dr.Willis-neurology, Imuran and prednisone  . Calculus of kidney   . Inguinal hernia without mention of obstruction or gangrene, unilateral or unspecified, (not specified as recurrent)     right   . Unspecified gastritis and gastroduodenitis without mention of hemorrhage   . Abdominal pain, epigastric   . Nonspecific elevation of levels of transaminase or lactic acid dehydrogenase (LDH)   . Feeding difficulties and mismanagement   . Edema of male genital organs     Resolved, occurred during illness related to myositis  . Swelling of arm     left.Marland KitchenMarland KitchenResolved.... no DVT  . LFT elevation     Related to Imuran in the past... dose was adjusted  . Dyslipidemia     statins not used due to LFT abnormalities  . Shortness of breath     Breathing abnormalities related to muscle weakness from myositis  . RBBB (right bundle branch block)     Old  . Bradycardia     Sinus bradycardia, old  . Ejection fraction     EF 60%, echo, October, 2012  . Mitral regurgitation     Mild / moderate, echo, 2012  . Right ventricular dysfunction     Mild, echo, 2012  . Febrile illness     May, 2013, question sinusitis    Past Surgical History  Procedure Date  . Splenectomy   . Hernia repair     left side  . Peg placement 06/2007  . Peg tube removal 12/2007  . Kyphosis surgery     multilevel vertebral  . Odontoid fracture surgery     anterior screw fixation  . Nasal sinus surgery     x2  . Cataract extraction     bi lateral    ROS   Patient denies fever, chills, headache, sweats, rash, change in vision, change in hearing, chest pain. He has mild chronic shortness of breath it is old. He has no nausea vomiting or urinary symptoms. All other systems are reviewed and are negative.  PHYSICAL EXAM  He is a pleasant gentleman who is oriented to person time and place. Affect is normal. He has  kyphosis of the thoracic spine. He stable. There is no jugular venous distention. Lungs are clear. Respiratory effort is nonlabored. Cardiac exam reveals S1 and S2. There no clicks or significant murmurs. The abdomen is soft. Is no peripheral edema.  Filed Vitals:   12/22/11 0945  BP: 110/62  Pulse: 40  Resp: 17  Height: 5\' 5"  (1.651 m)  Weight: 128 lb (58.06 kg)  SpO2: 94%     ASSESSMENT & PLAN

## 2011-12-22 NOTE — Assessment & Plan Note (Signed)
No recurrent chest pain. 

## 2011-12-30 ENCOUNTER — Ambulatory Visit: Payer: Self-pay | Admitting: Oncology

## 2011-12-30 LAB — COMPREHENSIVE METABOLIC PANEL
Albumin: 3.6 g/dL (ref 3.4–5.0)
Alkaline Phosphatase: 79 U/L (ref 50–136)
Anion Gap: 2 — ABNORMAL LOW (ref 7–16)
Bilirubin,Total: 0.8 mg/dL (ref 0.2–1.0)
Calcium, Total: 8.9 mg/dL (ref 8.5–10.1)
Chloride: 102 mmol/L (ref 98–107)
Co2: 34 mmol/L — ABNORMAL HIGH (ref 21–32)
Creatinine: 0.96 mg/dL (ref 0.60–1.30)
EGFR (Non-African Amer.): >60
Osmolality: 279 (ref 275–301)
Potassium: 4.3 mmol/L (ref 3.5–5.1)
SGOT(AST): 29 U/L (ref 15–37)
Sodium: 138 mmol/L (ref 136–145)

## 2011-12-30 LAB — CBC CANCER CENTER
Basophil #: 0.1 x10 3/mm (ref 0.0–0.1)
Eosinophil #: 0.2 x10 3/mm (ref 0.0–0.7)
HGB: 12.6 g/dL — ABNORMAL LOW (ref 13.0–18.0)
Lymphocyte #: 6.5 x10 3/mm — ABNORMAL HIGH (ref 1.0–3.6)
Lymphocyte %: 65.3 %
MCHC: 32.3 g/dL (ref 32.0–36.0)
Monocyte %: 16.8 %
Neutrophil %: 15.2 %
Platelet: 207 x10 3/mm (ref 150–440)
RBC: 3.81 10*6/uL — ABNORMAL LOW (ref 4.40–5.90)
RDW: 15.5 % — ABNORMAL HIGH (ref 11.5–14.5)
WBC: 10 x10 3/mm (ref 3.8–10.6)

## 2011-12-30 LAB — LACTATE DEHYDROGENASE: LDH: 278 U/L — ABNORMAL HIGH (ref 85–241)

## 2012-01-10 ENCOUNTER — Ambulatory Visit: Payer: Self-pay | Admitting: Oncology

## 2012-05-01 ENCOUNTER — Encounter: Payer: Self-pay | Admitting: Cardiology

## 2012-05-28 ENCOUNTER — Encounter: Payer: Self-pay | Admitting: Cardiology

## 2012-05-28 ENCOUNTER — Ambulatory Visit (INDEPENDENT_AMBULATORY_CARE_PROVIDER_SITE_OTHER): Payer: Medicare Other | Admitting: Cardiology

## 2012-05-28 VITALS — BP 110/66 | HR 56 | Ht 65.0 in | Wt 138.0 lb

## 2012-05-28 DIAGNOSIS — I5189 Other ill-defined heart diseases: Secondary | ICD-10-CM

## 2012-05-28 DIAGNOSIS — R001 Bradycardia, unspecified: Secondary | ICD-10-CM

## 2012-05-28 DIAGNOSIS — R609 Edema, unspecified: Secondary | ICD-10-CM

## 2012-05-28 DIAGNOSIS — R079 Chest pain, unspecified: Secondary | ICD-10-CM

## 2012-05-28 DIAGNOSIS — I498 Other specified cardiac arrhythmias: Secondary | ICD-10-CM

## 2012-05-28 DIAGNOSIS — I519 Heart disease, unspecified: Secondary | ICD-10-CM

## 2012-05-28 DIAGNOSIS — I351 Nonrheumatic aortic (valve) insufficiency: Secondary | ICD-10-CM

## 2012-05-28 DIAGNOSIS — I359 Nonrheumatic aortic valve disorder, unspecified: Secondary | ICD-10-CM

## 2012-05-28 DIAGNOSIS — E785 Hyperlipidemia, unspecified: Secondary | ICD-10-CM

## 2012-05-28 MED ORDER — FUROSEMIDE 80 MG PO TABS: 80.0000 mg | ORAL_TABLET | Freq: Every day | ORAL | Status: DC

## 2012-05-28 NOTE — Assessment & Plan Note (Signed)
He's not having any chest pain. We thought he might have borderline inferior and posterior hypokinesis in the past. He has never undergone cardiac catheterization.

## 2012-05-28 NOTE — Assessment & Plan Note (Signed)
Heart rate is stable. No change in therapy. 

## 2012-05-28 NOTE — Assessment & Plan Note (Signed)
Aortic insufficiency is mild. He does not need to follow up echo.

## 2012-05-28 NOTE — Progress Notes (Signed)
HPI   Patient returns for followup. He's doing very well. His episode of myositis had resolved. He's not having any problems with fluid overload when he remains on his diuretic. He's not having any chest pain or shortness of breath.  Allergies  Allergen Reactions  . Sulfonamide Derivatives     Current Outpatient Prescriptions  Medication Sig Dispense Refill  . aspirin 81 MG tablet Take 81 mg by mouth daily.      . calcium-vitamin D (CALCIUM 500+D) 500-200 MG-UNIT per tablet Take 1 tablet by mouth 2 (two) times daily.        . Cholecalciferol (VITAMIN D3) 1000 UNITS tablet Take 1,000 Units by mouth daily.        . citalopram (CELEXA) 40 MG tablet       . ferrous sulfate 325 (65 FE) MG tablet Take 325 mg by mouth daily with breakfast.        . fish oil-omega-3 fatty acids 1000 MG capsule Take 1 g by mouth daily. Take one tablet by mouth two times daily      . furosemide (LASIX) 80 MG tablet Take 1 tablet (80 mg total) by mouth daily.  90 tablet  3  . HYDROcodone-acetaminophen (NORCO/VICODIN) 5-325 MG per tablet Take 1 tablet by mouth every 6 (six) hours as needed for pain.      . Multiple Vitamin (MULTIVITAMIN) capsule Take 1 capsule by mouth daily.        . mycophenolate (CELLCEPT) 500 MG tablet Take 500 mg by mouth 2 (two) times daily.        . potassium chloride SA (KLOR-CON M20) 20 MEQ tablet Take 1 tablet (20 mEq total) by mouth daily. Take one tablet by mouth once daily. Take an extra tablet twice a week.  120 tablet  3  . Resource Beneprotein PACK Take 3g three times a day       . temazepam (RESTORIL) 30 MG capsule Take 30 mg by mouth at bedtime.        . vitamin B-12 (CYANOCOBALAMIN) 1000 MCG tablet Take 1,000 mcg by mouth daily.        . vitamin B-12 (CYANOCOBALAMIN) 1000 MCG tablet Inject 1,000 mcg as directed every 30 (thirty) days.        Marland Kitchen zolendronic acid (ZOMETA) 4 MG/5ML injection Inject 4 mg into the vein. yearly       No current facility-administered medications for  this visit.    History   Social History  . Marital Status: Widowed    Spouse Name: N/A    Number of Children: N/A  . Years of Education: N/A   Occupational History  . Not on file.   Social History Main Topics  . Smoking status: Former Smoker -- 0.50 packs/day for 31 years    Types: Cigarettes    Quit date: 06/29/1980  . Smokeless tobacco: Not on file  . Alcohol Use: Yes     Comment: rarely  . Drug Use: No  . Sexually Active: Not on file   Other Topics Concern  . Not on file   Social History Narrative  . No narrative on file    Family History  Problem Relation Age of Onset  . Breast cancer Sister   . Colon cancer Brother   . Prostate cancer Brother   . Stroke Mother   . Emphysema Father   . Diabetes Grandchild     Past Medical History  Diagnosis Date  . CLL (chronic lymphoblastic leukemia) 2006  Rituxan treatment 2006  . Atrial septal aneurysm 2009    Insignificant, no, January, 2009  . Infection of PEG site     cellulitis.Marland KitchenResolved  . Vertebral compression fracture     multiple levels  . Herpes zoster     right chest  . Aortic insufficiency 2009    mild, Mild, echo, 2012  . Diastolic dysfunction     mild  . Chest pain     EF 55%, echo, October, 2009, possible inferior and posterior borderline hypokinesis, patient has never  had cardiac catheterization  . Edema     EF 55%, echo, October, 2009, possible borderline inferior and posterior hypokinesis... heart catheterization has never been done(July 10, 2009)  . Myositis     Caused pulmonary insufficiency with muscle weakness in the past / Dr.Willis-neurology, Imuran and prednisone  . Calculus of kidney   . Inguinal hernia without mention of obstruction or gangrene, unilateral or unspecified, (not specified as recurrent)     right   . Unspecified gastritis and gastroduodenitis without mention of hemorrhage   . Abdominal pain, epigastric   . Nonspecific elevation of levels of transaminase or lactic  acid dehydrogenase (LDH)   . Feeding difficulties and mismanagement   . Edema of male genital organs     Resolved, occurred during illness related to myositis  . Swelling of arm     left.Marland KitchenMarland KitchenResolved.... no DVT  . LFT elevation     Related to Imuran in the past... dose was adjusted  . Dyslipidemia     statins not used due to LFT abnormalities  . Shortness of breath     Breathing abnormalities related to muscle weakness from myositis  . RBBB (right bundle branch block)     Old  . Bradycardia     Sinus bradycardia, old  . Ejection fraction     EF 60%, echo, October, 2012  . Mitral regurgitation     Mild / moderate, echo, 2012  . Right ventricular dysfunction     Mild, echo, 2012  . Febrile illness     May, 2013, question sinusitis    Past Surgical History  Procedure Laterality Date  . Splenectomy    . Hernia repair      left side  . Peg placement  06/2007  . Peg tube removal  12/2007  . Kyphosis surgery      multilevel vertebral  . Odontoid fracture surgery      anterior screw fixation  . Nasal sinus surgery      x2  . Cataract extraction      bi lateral    Patient Active Problem List  Diagnosis  . CLL  . HYPERLIPIDEMIA-MIXED  . HYPOKALEMIA  . ANEMIA  . GASTRITIS  . INGUINAL HERNIA, RIGHT  . NEPHROLITHIASIS  . EDEMA OF MALE GENITAL ORGANS  . MYOSITIS  . OSTEOPOROSIS  . FEEDING PROBLEM  . DYSPNEA  . ABDOMINAL PAIN, EPIGASTRIC  . ABNORMAL TRANSAMINASE-LFT'S  . CLL (chronic lymphoblastic leukemia)  . Atrial septal aneurysm  . Vertebral compression fracture  . Herpes zoster  . Diastolic dysfunction  . Chest pain  . Edema  . Myositis  . Dyslipidemia  . Shortness of breath  . RBBB (right bundle branch block)  . Bradycardia  . Ejection fraction  . Aortic insufficiency  . Right ventricular dysfunction  . Mitral regurgitation  . Febrile illness    ROS    Patient denies fever, chills, headache, sweats, rash, change in vision, change in hearing, chest  pain, cough, nausea or vomiting, urinary symptoms. All other systems are reviewed and are negative.  PHYSICAL EXAM  Patient is oriented to person time and place. Affect is normal. He has kyphosis of the thoracic spine. Lungs reveal decreased breath sounds. There is no respiratory distress. Cardiac exam reveals S1 and S2. There is a very soft systolic murmur. The abdomen is soft. There is no peripheral edema.  Filed Vitals:   05/28/12 1116  BP: 110/66  Pulse: 56  Height: 5\' 5"  (1.651 m)  Weight: 138 lb (62.596 kg)    ASSESSMENT & PLAN

## 2012-05-28 NOTE — Patient Instructions (Addendum)
Your physician wants you to follow-up in: 1 year. You will receive a reminder letter in the mail two months in advance. If you don't receive a letter, please call our office to schedule the follow-up appointment.  

## 2012-05-28 NOTE — Assessment & Plan Note (Signed)
His volume status is stable on diuretics. No change in therapy.

## 2012-05-28 NOTE — Assessment & Plan Note (Signed)
We do not use statins because of his LFT abnormalities.

## 2012-05-28 NOTE — Assessment & Plan Note (Signed)
He has diastolic dysfunction requires ongoing diuretics. He stable with this. He brought me recent labs that show excellent renal function. His potassium is stable.

## 2012-06-29 ENCOUNTER — Ambulatory Visit: Payer: Self-pay | Admitting: Oncology

## 2012-06-29 LAB — COMPREHENSIVE METABOLIC PANEL
Albumin: 3.4 g/dL (ref 3.4–5.0)
Alkaline Phosphatase: 92 U/L (ref 50–136)
Co2: 31 mmol/L (ref 21–32)
Creatinine: 1.09 mg/dL (ref 0.60–1.30)
Osmolality: 283 (ref 275–301)
Sodium: 140 mmol/L (ref 136–145)
Total Protein: 6.6 g/dL (ref 6.4–8.2)

## 2012-06-29 LAB — CBC CANCER CENTER
Basophil: 1 %
Eosinophil: 1 %
Lymphocytes: 76 %
MCH: 31.9 pg (ref 26.0–34.0)
Monocytes: 11 %
NRBC/100 WBC: 2 /100
Segmented Neutrophils: 5 %
WBC: 15.5 x10 3/mm — ABNORMAL HIGH (ref 3.8–10.6)

## 2012-07-10 ENCOUNTER — Ambulatory Visit: Payer: Self-pay | Admitting: Oncology

## 2012-11-09 ENCOUNTER — Ambulatory Visit: Payer: Self-pay | Admitting: Oncology

## 2012-11-20 ENCOUNTER — Encounter: Payer: Self-pay | Admitting: *Deleted

## 2012-11-26 ENCOUNTER — Ambulatory Visit (INDEPENDENT_AMBULATORY_CARE_PROVIDER_SITE_OTHER): Payer: Medicare Other | Admitting: Neurology

## 2012-11-26 ENCOUNTER — Other Ambulatory Visit: Payer: Self-pay | Admitting: Neurology

## 2012-11-26 ENCOUNTER — Encounter: Payer: Self-pay | Admitting: Neurology

## 2012-11-26 VITALS — BP 117/56 | HR 57 | Wt 131.0 lb

## 2012-11-26 DIAGNOSIS — M3320 Polymyositis, organ involvement unspecified: Secondary | ICD-10-CM

## 2012-11-26 DIAGNOSIS — Z5181 Encounter for therapeutic drug level monitoring: Secondary | ICD-10-CM

## 2012-11-26 HISTORY — DX: Polymyositis, organ involvement unspecified: M33.20

## 2012-11-26 NOTE — Progress Notes (Signed)
Reason for visit: Polymyositis  [name] [name] is an 79 y.o. male  History of present illness:   [name] [name] is an 79 year old right-handed white male with a history of polymyositis. The patient also has CLL. The patient is off of prednisone, and he is taking CellCept. Within the last month, the patient has begun having arthralgias, mainly in the shoulders and elbows, with some low-grade fevers. The patient was placed on doxycycline for presumed Lyme disease, but the Western blot test was negative. The patient is actually feeling better on the doxycycline over the last 2 weeks. The patient is functioning much better. The patient has also had some slight shortness of breath associated with the low-grade fevers and arthralgias. The patient does not believe that there has been any change in his strength. The patient denies any falls, or problems getting out of chairs or climbing stairs. The patient returns for an evaluation.  Past Medical History  Diagnosis Date  . CLL (chronic lymphoblastic leukemia) 2006    Rituxan treatment 2006  . Atrial septal aneurysm 2009    Insignificant, no, January, 2009  . Infection of PEG site     cellulitis.Marland KitchenResolved  . Vertebral compression fracture     multiple levels  . Herpes zoster     right chest  . Aortic insufficiency 2009    mild, Mild, echo, 2012  . Diastolic dysfunction     mild  . Chest pain     EF 55%, echo, October, 2009, possible inferior and posterior borderline hypokinesis, patient has never  had cardiac catheterization  . Edema     EF 55%, echo, October, 2009, possible borderline inferior and posterior hypokinesis... heart catheterization has never been done(July 10, 2009)  . Myositis     Caused pulmonary insufficiency with muscle weakness in the past / Dr.Cyndi Montejano-neurology, Imuran and prednisone  . Calculus of kidney   . Inguinal hernia without mention of obstruction or gangrene, unilateral or unspecified, (not specified as recurrent)    right   . Unspecified gastritis and gastroduodenitis without mention of hemorrhage   . Abdominal pain, epigastric   . Nonspecific elevation of levels of transaminase or lactic acid dehydrogenase (LDH)   . Feeding difficulties and mismanagement   . Edema of male genital organs     Resolved, occurred during illness related to myositis  . Swelling of arm     left.Marland KitchenMarland KitchenResolved.... no DVT  . LFT elevation     Related to Imuran in the past... dose was adjusted  . Dyslipidemia     statins not used due to LFT abnormalities  . Shortness of breath     Breathing abnormalities related to muscle weakness from myositis  . RBBB (right bundle branch block)     Old  . Bradycardia     Sinus bradycardia, old  . Ejection fraction     EF 60%, echo, October, 2012  . Mitral regurgitation     Mild / moderate, echo, 2012  . Right ventricular dysfunction     Mild, echo, 2012  . Febrile illness     May, 2013, question sinusitis  . Polymyositis 11/26/2012    Past Surgical History  Procedure Laterality Date  . Splenectomy    . Hernia repair      left side  . Peg placement  06/2007  . Peg tube removal  12/2007  . Kyphosis surgery      multilevel vertebral  . Odontoid fracture surgery      anterior screw fixation  .  Nasal sinus surgery      x2  . Cataract extraction      bi lateral    Family History  Problem Relation Age of Onset  . Breast cancer Sister   . Colon cancer Brother   . Prostate cancer Brother   . Stroke Mother   . Emphysema Father   . Diabetes Grandchild     Social history:  reports that he quit smoking about 32 years ago. His smoking use included Cigarettes. He has a 15.5 pack-year smoking history. He has never used smokeless tobacco. He reports that  drinks alcohol. He reports that he does not use illicit drugs.  Allergies:  Allergies  Allergen Reactions  . Sulfonamide Derivatives     Medications:  Current Outpatient Prescriptions on File Prior to Visit  Medication Sig  Dispense Refill  . aspirin 81 MG tablet Take 81 mg by mouth daily.      . calcium-vitamin D (CALCIUM 500+D) 500-200 MG-UNIT per tablet Take 1 tablet by mouth 2 (two) times daily.        . Cholecalciferol (VITAMIN D3) 1000 UNITS tablet Take 1,000 Units by mouth daily.        . citalopram (CELEXA) 40 MG tablet       . ferrous sulfate 325 (65 FE) MG tablet Take 325 mg by mouth daily with breakfast.        . fish oil-omega-3 fatty acids 1000 MG capsule Take 1 g by mouth daily. Take one tablet by mouth two times daily      . furosemide (LASIX) 80 MG tablet Take 1 tablet (80 mg total) by mouth daily.  90 tablet  3  . HYDROcodone-acetaminophen (NORCO/VICODIN) 5-325 MG per tablet Take 1 tablet by mouth every 6 (six) hours as needed for pain.      . Multiple Vitamin (MULTIVITAMIN) capsule Take 1 capsule by mouth daily.        . mycophenolate (CELLCEPT) 500 MG tablet Take 500 mg by mouth 2 (two) times daily.        . potassium chloride SA (KLOR-CON M20) 20 MEQ tablet Take 1 tablet (20 mEq total) by mouth daily. Take one tablet by mouth once daily. Take an extra tablet twice a week.  120 tablet  3  . Resource Beneprotein PACK Take 3g three times a day       . temazepam (RESTORIL) 30 MG capsule Take 30 mg by mouth at bedtime.        . vitamin B-12 (CYANOCOBALAMIN) 1000 MCG tablet Take 1,000 mcg by mouth daily.        . vitamin B-12 (CYANOCOBALAMIN) 1000 MCG tablet Inject 1,000 mcg as directed every 30 (thirty) days.        Marland Kitchen zolendronic acid (ZOMETA) 4 MG/5ML injection Inject 4 mg into the vein. yearly       No current facility-administered medications on file prior to visit.    ROS:  Out of a complete 14 system review of symptoms, the patient complains only of the following symptoms, and all other reviewed systems are negative.  Fevers, fatigue Ringing in the ears Shortness of breath Joint pain  Blood pressure 117/56, pulse 57, weight 131 lb (59.421 kg).  Physical Exam  General: The patient is  alert and cooperative at the time of the examination.  Skin: 1-2+ edema at the ankles is noted bilaterally.   Neurologic Exam  Cranial nerves: Facial symmetry is present. Speech is normal, no aphasia or dysarthria is noted. Extraocular  movements are full. Visual fields are full.  Motor: The patient has good strength in all 4 extremities. The patient is able to arise from a seated position with arms crossed.  Coordination: The patient has good finger-nose-finger and heel-to-shin bilaterally.  Gait and station: The patient has a normal gait. Tandem gait is normal. Romberg is negative. No drift is seen.  Reflexes: Deep tendon reflexes are symmetric.   Assessment/Plan:  1. Polymyositis  The patient is doing well currently with his strength, but he is having some systemic symptoms of low-grade fevers and fatigue. The patient will have blood work checked today. The patient will remain on CellCept for now. The patient will followup through this office in 6 or 7 months. We will recheck blood work in 3 months.  Marlan Palau MD 11/26/2012 8:15 PM  [facility] [address] [place], [place] [address]  Phone [phone] Fax [phone]

## 2012-12-05 ENCOUNTER — Telehealth: Payer: Self-pay | Admitting: Neurology

## 2012-12-05 LAB — CBC WITH DIFFERENTIAL
Basos: 0 % (ref 0–3)
Eosinophils Absolute: 0 10*3/uL (ref 0.0–0.4)
HCT: 28.9 % — ABNORMAL LOW (ref 37.5–51.0)
Hemoglobin: 8.9 g/dL — ABNORMAL LOW (ref 12.6–17.7)
Lymphs: 88 % — ABNORMAL HIGH (ref 14–46)
MCHC: 30.8 g/dL — ABNORMAL LOW (ref 31.5–35.7)
Monocytes: 7 % (ref 4–12)
Neutrophils Relative %: 5 % — ABNORMAL LOW (ref 40–74)
RBC: 2.7 x10E6/uL — CL (ref 4.14–5.80)
WBC: 36.9 10*3/uL (ref 3.4–10.8)

## 2012-12-05 LAB — COMPREHENSIVE METABOLIC PANEL
Albumin: 3.7 g/dL (ref 3.5–4.7)
BUN: 18 mg/dL (ref 8–27)
CO2: 25 mmol/L (ref 18–29)
Chloride: 102 mmol/L (ref 97–108)
Creatinine, Ser: 0.88 mg/dL (ref 0.76–1.27)
GFR calc Af Amer: 94 mL/min/{1.73_m2} (ref >59)
Globulin, Total: 2.1 g/dL (ref 1.5–4.5)
Glucose: 83 mg/dL (ref 65–99)
Total Bilirubin: 1.1 mg/dL (ref 0.0–1.2)
Total Protein: 5.8 g/dL — ABNORMAL LOW (ref 6.0–8.5)

## 2012-12-05 LAB — PLEASE NOTE

## 2012-12-05 NOTE — Telephone Encounter (Signed)
I called the patient and I left a message, and then I called the daughter. I discussed the blood work with her. The white blood count has gone up dramatically into the 36k range, and the hemoglobin has dropped to 8.9. The patient has CLL. I will fax the results of the CBC to the primary care doctor and to the oncologist. I'll recheck the blood work in 2 weeks.

## 2012-12-07 LAB — CBC CANCER CENTER
Bands: 2 %
Basophil #: 0.1 x10 3/mm (ref 0.0–0.1)
Basophil %: 0.3 %
Eosinophil %: 0.7 %
Eosinophil: 1 %
HCT: 31.5 % — ABNORMAL LOW (ref 40.0–52.0)
Lymphocyte #: 21.8 x10 3/mm — ABNORMAL HIGH (ref 1.0–3.6)
Lymphocyte %: 80.5 %
Lymphocytes: 76 %
MCHC: 31.4 g/dL — ABNORMAL LOW (ref 32.0–36.0)
MCV: 109 fL — ABNORMAL HIGH (ref 80–100)
Monocyte #: 2.9 x10 3/mm — ABNORMAL HIGH (ref 0.2–1.0)
Monocytes: 13 %
NRBC/100 WBC: 2 /100
Neutrophil %: 7.7 %
Platelet: 148 x10 3/mm — ABNORMAL LOW (ref 150–440)
RBC: 2.89 10*6/uL — ABNORMAL LOW (ref 4.40–5.90)
RDW: 21 % — ABNORMAL HIGH (ref 11.5–14.5)
Segmented Neutrophils: 8 %
WBC: 27.1 x10 3/mm — ABNORMAL HIGH (ref 3.8–10.6)

## 2012-12-07 LAB — FERRITIN: Ferritin (ARMC): 384 ng/mL (ref 8–388)

## 2012-12-07 LAB — RETICULOCYTES: Reticulocyte: 4.91 % — ABNORMAL HIGH (ref 0.4–3.1)

## 2012-12-07 LAB — IRON AND TIBC
Iron Bind.Cap.(Total): 332 ug/dL (ref 250–450)
Iron: 166 ug/dL (ref 65–175)

## 2012-12-10 ENCOUNTER — Ambulatory Visit: Payer: Self-pay | Admitting: Oncology

## 2012-12-11 LAB — OCCULT BLOOD X 1 CARD TO LAB, STOOL: Occult Blood, Feces: NEGATIVE

## 2012-12-19 ENCOUNTER — Telehealth: Payer: Self-pay | Admitting: Neurology

## 2012-12-19 NOTE — Telephone Encounter (Signed)
Message copied by Stephanie Acre on Wed Dec 19, 2012  6:23 PM ------      Message from: Stephanie Acre      Created: Wed Dec 05, 2012  9:58 AM       Recheck CBC ------

## 2012-12-19 NOTE — Telephone Encounter (Signed)
I called the patient regarding come in for another CBC evaluation. The patient has already seen his oncologist, and they are considering using Rituxan. They are following his CBC, I do not need to check blood work.

## 2012-12-21 LAB — CBC CANCER CENTER
HGB: 10.6 g/dL — ABNORMAL LOW (ref 13.0–18.0)
Lymphocyte #: 5.8 x10 3/mm — ABNORMAL HIGH (ref 1.0–3.6)
Monocyte #: 2 x10 3/mm — ABNORMAL HIGH (ref 0.2–1.0)
Monocyte %: 19.4 %
Neutrophil %: 21.1 %
Platelet: 197 x10 3/mm (ref 150–440)
RDW: 19.6 % — ABNORMAL HIGH (ref 11.5–14.5)
WBC: 10.2 x10 3/mm (ref 3.8–10.6)

## 2012-12-21 LAB — BASIC METABOLIC PANEL
Anion Gap: 6 — ABNORMAL LOW (ref 7–16)
BUN: 17 mg/dL (ref 7–18)
Chloride: 106 mmol/L (ref 98–107)
Creatinine: 0.88 mg/dL (ref 0.60–1.30)
Glucose: 106 mg/dL — ABNORMAL HIGH (ref 65–99)

## 2012-12-28 LAB — CBC CANCER CENTER
Eosinophil #: 0.2 x10 3/mm (ref 0.0–0.7)
Eosinophil %: 3.2 %
MCV: 109 fL — ABNORMAL HIGH (ref 80–100)
Monocyte #: 1.6 x10 3/mm — ABNORMAL HIGH (ref 0.2–1.0)
Monocyte %: 22.2 %
Neutrophil #: 2.3 x10 3/mm (ref 1.4–6.5)
RBC: 3.25 10*6/uL — ABNORMAL LOW (ref 4.40–5.90)

## 2013-01-04 LAB — CBC CANCER CENTER
Basophil #: 0.1 x10 3/mm (ref 0.0–0.1)
Eosinophil #: 0.2 x10 3/mm (ref 0.0–0.7)
Eosinophil %: 2.5 %
Lymphocyte #: 3.1 x10 3/mm (ref 1.0–3.6)
Lymphocyte %: 42.7 %
MCHC: 32.1 g/dL (ref 32.0–36.0)
MCV: 107 fL — ABNORMAL HIGH (ref 80–100)
Monocyte %: 25 %
Neutrophil %: 28.1 %
WBC: 7.3 x10 3/mm (ref 3.8–10.6)

## 2013-01-09 ENCOUNTER — Ambulatory Visit: Payer: Self-pay | Admitting: Oncology

## 2013-01-11 LAB — COMPREHENSIVE METABOLIC PANEL
Anion Gap: 4 — ABNORMAL LOW (ref 7–16)
Bilirubin,Total: 0.7 mg/dL (ref 0.2–1.0)
Calcium, Total: 8.9 mg/dL (ref 8.5–10.1)
EGFR (African American): >60
EGFR (Non-African Amer.): >60
Potassium: 3.7 mmol/L (ref 3.5–5.1)
SGOT(AST): 33 U/L (ref 15–37)
SGPT (ALT): 24 U/L (ref 12–78)
Sodium: 142 mmol/L (ref 136–145)
Total Protein: 7 g/dL (ref 6.4–8.2)

## 2013-01-11 LAB — CBC CANCER CENTER
Basophil %: 1.5 %
Eosinophil %: 2.6 %
HCT: 37.6 % — ABNORMAL LOW (ref 40.0–52.0)
HGB: 11.8 g/dL — ABNORMAL LOW (ref 13.0–18.0)
Lymphocyte %: 42.9 %
MCHC: 31.4 g/dL — ABNORMAL LOW (ref 32.0–36.0)
Monocyte %: 29.2 %
Neutrophil %: 23.8 %
Platelet: 215 x10 3/mm (ref 150–440)
RBC: 3.5 10*6/uL — ABNORMAL LOW (ref 4.40–5.90)
WBC: 5.9 x10 3/mm (ref 3.8–10.6)

## 2013-02-09 ENCOUNTER — Ambulatory Visit: Payer: Self-pay | Admitting: Oncology

## 2013-02-14 ENCOUNTER — Other Ambulatory Visit: Payer: Self-pay

## 2013-02-27 ENCOUNTER — Telehealth: Payer: Self-pay | Admitting: Neurology

## 2013-02-27 NOTE — Telephone Encounter (Signed)
I called the patient. The patient is being followed by an oncologist, and they are checking blood work regularly. There is no need to check blood work through this office. The patient is on Rituxan.

## 2013-04-19 ENCOUNTER — Ambulatory Visit: Payer: Self-pay | Admitting: Oncology

## 2013-04-19 LAB — CBC CANCER CENTER
BASOS PCT: 0.7 %
Basophil #: 0.1 x10 3/mm (ref 0.0–0.1)
EOS PCT: 3.3 %
Eosinophil #: 0.3 x10 3/mm (ref 0.0–0.7)
HCT: 42.2 % (ref 40.0–52.0)
HGB: 13.6 g/dL (ref 13.0–18.0)
Lymphocyte #: 4.1 x10 3/mm — ABNORMAL HIGH (ref 1.0–3.6)
Lymphocyte %: 50.6 %
MCH: 32 pg (ref 26.0–34.0)
MCHC: 32.2 g/dL (ref 32.0–36.0)
MCV: 100 fL (ref 80–100)
Monocyte #: 1.8 x10 3/mm — ABNORMAL HIGH (ref 0.2–1.0)
Monocyte %: 22 %
NEUTROS ABS: 1.9 x10 3/mm (ref 1.4–6.5)
Neutrophil %: 23.4 %
Platelet: 209 x10 3/mm (ref 150–440)
RBC: 4.24 10*6/uL — ABNORMAL LOW (ref 4.40–5.90)
RDW: 15.1 % — AB (ref 11.5–14.5)
WBC: 8.1 x10 3/mm (ref 3.8–10.6)

## 2013-04-19 LAB — COMPREHENSIVE METABOLIC PANEL
ALBUMIN: 3.6 g/dL (ref 3.4–5.0)
Alkaline Phosphatase: 82 U/L
Anion Gap: 4 — ABNORMAL LOW (ref 7–16)
BUN: 21 mg/dL — ABNORMAL HIGH (ref 7–18)
Bilirubin,Total: 0.8 mg/dL (ref 0.2–1.0)
CALCIUM: 9.5 mg/dL (ref 8.5–10.1)
Chloride: 104 mmol/L (ref 98–107)
Co2: 33 mmol/L — ABNORMAL HIGH (ref 21–32)
Creatinine: 0.88 mg/dL (ref 0.60–1.30)
EGFR (African American): >60
EGFR (Non-African Amer.): >60
Glucose: 94 mg/dL (ref 65–99)
Osmolality: 284 (ref 275–301)
Potassium: 4.6 mmol/L (ref 3.5–5.1)
SGOT(AST): 31 U/L (ref 15–37)
SGPT (ALT): 28 U/L (ref 12–78)
Sodium: 141 mmol/L (ref 136–145)
Total Protein: 6.9 g/dL (ref 6.4–8.2)

## 2013-05-12 ENCOUNTER — Ambulatory Visit: Payer: Self-pay | Admitting: Oncology

## 2013-05-13 ENCOUNTER — Telehealth: Payer: Self-pay | Admitting: Neurology

## 2013-05-13 MED ORDER — MYCOPHENOLATE MOFETIL 500 MG PO TABS: 500.0000 mg | ORAL_TABLET | Freq: Two times a day (BID) | ORAL | Status: DC

## 2013-05-13 NOTE — Telephone Encounter (Signed)
Pt called in requesting a refill on his Mycophenolate.  He stated Dr. Jannifer Franklin gives him 180 pills which is a 3 month supply and 3 refills.  Thank you

## 2013-05-13 NOTE — Telephone Encounter (Signed)
Rx has been sent  

## 2013-06-14 ENCOUNTER — Ambulatory Visit (INDEPENDENT_AMBULATORY_CARE_PROVIDER_SITE_OTHER): Payer: Medicare Other | Admitting: Neurology

## 2013-06-14 ENCOUNTER — Encounter: Payer: Self-pay | Admitting: Neurology

## 2013-06-14 VITALS — BP 132/64 | HR 56 | Wt 133.0 lb

## 2013-06-14 DIAGNOSIS — M3320 Polymyositis, organ involvement unspecified: Secondary | ICD-10-CM

## 2013-06-14 NOTE — Progress Notes (Signed)
Reason for visit: Polymyositis  [name] [name] is an 80 y.o. male  History of present illness:  [name] [name] is an 80 year old right-handed white male with a history of polymyositis that has been under relatively good control. The patient also has CLL, and he has recently received Rituxan for treatment of this with a good response. The patient has had normalization of the white blood count, and hemoglobin has gone up into a normal range. The patient feels much better. The patient denies any new weakness of the extremities, and he denies issues with chewing or swallowing or problems going up stairs. The patient has not had any falls. The patient in general feels well. The patient returns for an evaluation.  Past Medical History  Diagnosis Date  . CLL (chronic lymphoblastic leukemia) 2006    Rituxan treatment 2006  . Atrial septal aneurysm 2009    Insignificant, no, January, 2009  . Infection of PEG site     cellulitis.Marland KitchenResolved  . Vertebral compression fracture     multiple levels  . Herpes zoster     right chest  . Aortic insufficiency 2009    mild, Mild, echo, 2012  . Diastolic dysfunction     mild  . Chest pain     EF 55%, echo, October, 2009, possible inferior and posterior borderline hypokinesis, patient has never  had cardiac catheterization  . Edema     EF 55%, echo, October, 2009, possible borderline inferior and posterior hypokinesis... heart catheterization has never been done(July 10, 2009)  . Myositis     Caused pulmonary insufficiency with muscle weakness in the past / Dr.Neida Ellegood-neurology, Imuran and prednisone  . Calculus of kidney   . Inguinal hernia without mention of obstruction or gangrene, unilateral or unspecified, (not specified as recurrent)     right   . Unspecified gastritis and gastroduodenitis without mention of hemorrhage   . Abdominal pain, epigastric   . Nonspecific elevation of levels of transaminase or lactic acid dehydrogenase (LDH)   . Feeding  difficulties and mismanagement   . Edema of male genital organs     Resolved, occurred during illness related to myositis  . Swelling of arm     left.Marland KitchenMarland KitchenResolved.... no DVT  . LFT elevation     Related to Imuran in the past... dose was adjusted  . Dyslipidemia     statins not used due to LFT abnormalities  . Shortness of breath     Breathing abnormalities related to muscle weakness from myositis  . RBBB (right bundle branch block)     Old  . Bradycardia     Sinus bradycardia, old  . Ejection fraction     EF 60%, echo, October, 2012  . Mitral regurgitation     Mild / moderate, echo, 2012  . Right ventricular dysfunction     Mild, echo, 2012  . Febrile illness     May, 2013, question sinusitis  . Polymyositis 11/26/2012    Past Surgical History  Procedure Laterality Date  . Splenectomy    . Hernia repair      left side  . Peg placement  06/2007  . Peg tube removal  12/2007  . Kyphosis surgery      multilevel vertebral  . Odontoid fracture surgery      anterior screw fixation  . Nasal sinus surgery      x2  . Cataract extraction      bi lateral    Family History  Problem Relation Age of  Onset  . Breast cancer Sister   . Colon cancer Brother   . Prostate cancer Brother   . Stroke Mother   . Emphysema Father   . Diabetes Grandchild     Social history:  reports that he quit smoking about 32 years ago. His smoking use included Cigarettes. He has a 15.5 pack-year smoking history. He has never used smokeless tobacco. He reports that he drinks alcohol. He reports that he does not use illicit drugs.    Allergies  Allergen Reactions  . Sulfonamide Derivatives     Medications:  Current Outpatient Prescriptions on File Prior to Visit  Medication Sig Dispense Refill  . aspirin 81 MG tablet Take 81 mg by mouth daily.      . calcium-vitamin D (CALCIUM 500+D) 500-200 MG-UNIT per tablet Take 1 tablet by mouth 2 (two) times daily.        . Cholecalciferol (VITAMIN D3) 1000  UNITS tablet Take 1,000 Units by mouth daily.        . citalopram (CELEXA) 40 MG tablet       . doxycycline (VIBRAMYCIN) 100 MG capsule Take 100 mg by mouth 2 (two) times daily.      . ferrous sulfate 325 (65 FE) MG tablet Take 325 mg by mouth daily with breakfast.        . fish oil-omega-3 fatty acids 1000 MG capsule Take 1 g by mouth daily. Take one tablet by mouth two times daily      . furosemide (LASIX) 80 MG tablet Take 1 tablet (80 mg total) by mouth daily.  90 tablet  3  . HYDROcodone-acetaminophen (NORCO/VICODIN) 5-325 MG per tablet Take 1 tablet by mouth every 6 (six) hours as needed for pain.      . Multiple Vitamin (MULTIVITAMIN) capsule Take 1 capsule by mouth daily.        . mycophenolate (CELLCEPT) 500 MG tablet Take 1 tablet (500 mg total) by mouth 2 (two) times daily.  180 tablet  2  . potassium chloride SA (KLOR-CON M20) 20 MEQ tablet Take 1 tablet (20 mEq total) by mouth daily. Take one tablet by mouth once daily. Take an extra tablet twice a week.  120 tablet  3  . Resource Beneprotein PACK Take 3g three times a day       . temazepam (RESTORIL) 30 MG capsule Take 30 mg by mouth at bedtime.        . vitamin B-12 (CYANOCOBALAMIN) 1000 MCG tablet Take 1,000 mcg by mouth daily.        . vitamin B-12 (CYANOCOBALAMIN) 1000 MCG tablet Inject 1,000 mcg as directed every 30 (thirty) days.         No current facility-administered medications on file prior to visit.    ROS:  Out of a complete 14 system review of symptoms, the patient complains only of the following symptoms, and all other reviewed systems are negative.  Ringing in the ears Joint pain  Blood pressure 132/64, pulse 56, weight 133 lb (60.328 kg).  Physical Exam  General: The patient is alert and cooperative at the time of the examination.  Skin: No significant peripheral edema is noted.   Neurologic Exam  Mental status: The patient is oriented x 3.  Cranial nerves: Facial symmetry is present. Speech is  normal, no aphasia or dysarthria is noted. Extraocular movements are full. Visual fields are full.  Motor: The patient has good strength in all 4 extremities, with the exception that there is slight  weakness proximally in the legs bilaterally. The patient is able to arise from a seated position with arms crossed.  Sensory examination: Soft touch sensation on the face, arms, and legs is symmetric.  Coordination: The patient has good finger-nose-finger and heel-to-shin bilaterally.  Gait and station: The patient has a normal gait. Tandem gait is normal. Romberg is negative. No drift is seen.  Reflexes: Deep tendon reflexes are symmetric, but are depressed.   Assessment/Plan:  1. Polymyositis  2. CLL  The patient is doing well at this point. The patient will be maintained on the CellCept. The patient has had recent blood work done through his primary care physician. The patient will followup in about 6 months for an evaluation. We will check blood work in about 3 months.  Jill Alexanders MD 06/15/2013 5:48 PM  [facility] [address] [place], [place] [address]  Phone [phone] Fax [phone]

## 2013-06-14 NOTE — Patient Instructions (Signed)
Polymyositis Polymyositis is one type of inflammatory myopathy. Inflammatory myopathies are muscle diseases that involve redness, soreness, and swelling (inflammation) of the muscles. Polymyositis causes decreased muscle power. It affects the connective tissues of the body (connective tissue disease). It is often associated with diseases in which the body attacks its own cells (autoimmune diseases). Polymyositis begins gradually. It generally affects adults in their 64s to 36s. CAUSES  The cause is often unknown. Some cases are caused by a bacteria, virus, or parasite infection. This can trigger autoantibodies, which attack normal, healthy cells. SYMPTOMS   The most common symptom is muscle weakness. This often affects muscles close to the trunk of the body (proximal). Muscles not close to the trunk (distal) may also be affected later on in the disease.  Eventually, patients have trouble:  Rising from a sitting position.  Climbing stairs.  Lifting objects.  Reaching overhead.  Trouble swallowing (dysphagia) may occur.  Rarely, the muscles ache and are tender to touch. Associated conditions include:  Fingers, toes, nose, and ears that turn pale when exposed to cold (Raynaud's phenomenon).  Other connective tissue diseases. This may include lupus, rheumatoid arthritis, or scleroderma.  Heart disease. This is caused by inflammation of the heart muscle. DIAGNOSIS  Diagnosis can be difficult. It is based on symptoms and a thorough exam. Further testing may be done, including:  Blood tests to check for high levels of muscle enzymes, which suggest muscle damage, and specific autoantibodies.  Computerized magnetic scan (MRI).  Test to check electrical activity of the muscle (electromyography).  Exam of a muscle tissue sample (biopsy). TREATMENT  There is no cure. Treatment can improve muscle strength and function. The earlier treatment is started, the more effective it is. Early  treatment often means fewer complications.  Drug treatments may include:  Corticosteroids, especially prednisone. Patients often improve in about 2 to 4 weeks. However, drug therapy may be needed for years. Long-term use of corticosteroids can have serious side effects. Your caregiver may advise supplements or may prescribe other medicines.  Corticosteroid-sparing agents.  Intravenous immunoglobulin (IVIG). This contains healthy antibodies from blood donors.  Immunosuppressive therapies, such as tacrolimus.  Physical therapy is often advised. This helps stop muscles from wasting away (atrophy).  Chewing and swallowing can be more difficult later on in the disease. A registered dietitian can teach you how to make foods that are easy to eat.  Speech therapy can be helpful if your swallowing muscles are weakened. Results from therapy vary depending on the severity of the disease. Rarely, the disease may be life-threatening to patients with severe, progressive muscle weakness, dysphagia, malnutrition, pneumonia, or respiratory failure. HOME CARE INSTRUCTIONS   Stay active. An exercise routine can help you build and maintain muscle strength. It is important to get a detailed plan and recommendations from your caregiver or physical therapist before starting an exercise program.  Rest when you are tired. Do not wait until you are exhausted to rest. Learn to pace yourself. SEEK MEDICAL CARE IF:  You develop new or worsening muscle weakness. SEEK IMMEDIATE MEDICAL CARE IF:   You have trouble swallowing or speaking.  You develop shortness of breath. FOR MORE INFORMATION  The Myositis Association: www.myositis.[facility] of Neurological Disorders and Stroke: [employer] Document Released: 03/18/2002 Document Revised: 06/20/2011 Document Reviewed: 07/30/2009 [facility] Patient Information 2014 [employer], [place].

## 2013-06-24 ENCOUNTER — Encounter: Payer: Self-pay | Admitting: Cardiology

## 2013-06-24 ENCOUNTER — Ambulatory Visit (INDEPENDENT_AMBULATORY_CARE_PROVIDER_SITE_OTHER): Payer: Medicare Other | Admitting: Cardiology

## 2013-06-24 VITALS — BP 110/50 | HR 45 | Ht 65.0 in | Wt 134.0 lb

## 2013-06-24 DIAGNOSIS — I498 Other specified cardiac arrhythmias: Secondary | ICD-10-CM

## 2013-06-24 DIAGNOSIS — R0602 Shortness of breath: Secondary | ICD-10-CM

## 2013-06-24 DIAGNOSIS — R001 Bradycardia, unspecified: Secondary | ICD-10-CM

## 2013-06-24 DIAGNOSIS — R079 Chest pain, unspecified: Secondary | ICD-10-CM

## 2013-06-24 DIAGNOSIS — I359 Nonrheumatic aortic valve disorder, unspecified: Secondary | ICD-10-CM

## 2013-06-24 DIAGNOSIS — I351 Nonrheumatic aortic (valve) insufficiency: Secondary | ICD-10-CM

## 2013-06-24 DIAGNOSIS — I5189 Other ill-defined heart diseases: Secondary | ICD-10-CM

## 2013-06-24 DIAGNOSIS — I519 Heart disease, unspecified: Secondary | ICD-10-CM

## 2013-06-24 NOTE — Assessment & Plan Note (Signed)
Bradycardia is old. He is not on medications that would slow his heart rate any further. He has no symptoms. He walks without difficulties. There is no syncope or presyncope. No change in therapy.

## 2013-06-24 NOTE — Assessment & Plan Note (Signed)
He has mild valvular abnormalities by echo. He does not need a follow up echo at this time. I will plan to see him back in one year.

## 2013-06-24 NOTE — Assessment & Plan Note (Signed)
He has not had any recurring chest pain. We think he may have coronary disease but he has never undergone cardiac catheterization.

## 2013-06-24 NOTE — Assessment & Plan Note (Signed)
He has diastolic dysfunction and requires diuretics. However his renal function and potassium are stable and he is doing quite well.

## 2013-06-24 NOTE — Assessment & Plan Note (Signed)
Originally his shortness of breath was primarily related to decreased function of his breathing muscles when he had myositis. He also has some diastolic dysfunction. He does not have shortness of breath at this time.

## 2013-06-24 NOTE — Progress Notes (Signed)
HPI  Patient is seen today to followup his overall cardiac status. I saw him last February, 2014. Over the years he's had multiple medical problems. Fortunately he's done well. He's feeling well. He's not having any significant chest pain or shortness of breath. His myositis is under good control. He had significant anemia that has also been treated.  Allergies  Allergen Reactions  . Sulfonamide Derivatives     Current Outpatient Prescriptions  Medication Sig Dispense Refill  . aspirin 81 MG tablet Take 81 mg by mouth daily.      . calcium-vitamin D (CALCIUM 500+D) 500-200 MG-UNIT per tablet Take 1 tablet by mouth 2 (two) times daily.        . Cholecalciferol (VITAMIN D3) 1000 UNITS tablet Take 1,000 Units by mouth daily.        . citalopram (CELEXA) 40 MG tablet       . doxycycline (VIBRAMYCIN) 100 MG capsule Take 100 mg by mouth 2 (two) times daily.      . ferrous sulfate 325 (65 FE) MG tablet Take 325 mg by mouth daily with breakfast.        . fish oil-omega-3 fatty acids 1000 MG capsule Take 1 g by mouth daily. Take one tablet by mouth two times daily      . furosemide (LASIX) 80 MG tablet Take 1 tablet (80 mg total) by mouth daily.  90 tablet  3  . HYDROcodone-acetaminophen (NORCO/VICODIN) 5-325 MG per tablet Take 1 tablet by mouth every 6 (six) hours as needed for pain.      . Multiple Vitamin (MULTIVITAMIN) capsule Take 1 capsule by mouth daily.        . mycophenolate (CELLCEPT) 500 MG tablet Take 1 tablet (500 mg total) by mouth 2 (two) times daily.  180 tablet  2  . potassium chloride SA (KLOR-CON M20) 20 MEQ tablet Take 1 tablet (20 mEq total) by mouth daily. Take one tablet by mouth once daily. Take an extra tablet twice a week.  120 tablet  3  . Resource Beneprotein PACK Take 3g three times a day       . temazepam (RESTORIL) 30 MG capsule Take 30 mg by mouth at bedtime.        . vitamin B-12 (CYANOCOBALAMIN) 1000 MCG tablet Take 1,000 mcg by mouth daily.        . vitamin  B-12 (CYANOCOBALAMIN) 1000 MCG tablet Inject 1,000 mcg as directed every 30 (thirty) days.         No current facility-administered medications for this visit.    History   Social History  . Marital Status: Widowed    Spouse Name: N/A    Number of Children: 38  . Years of Education: college   Occupational History  . retired    Social History Main Topics  . Smoking status: Former Smoker -- 0.50 packs/day for 31 years    Types: Cigarettes    Quit date: 06/29/1980  . Smokeless tobacco: Never Used  . Alcohol Use: Yes     Comment: rarely  . Drug Use: No  . Sexual Activity: Not on file   Other Topics Concern  . Not on file   Social History Narrative  . No narrative on file    Family History  Problem Relation Age of Onset  . Breast cancer Sister   . Colon cancer Brother   . Prostate cancer Brother   . Stroke Mother   . Emphysema Father   .  Diabetes Grandchild     Past Medical History  Diagnosis Date  . CLL (chronic lymphoblastic leukemia) 2006    Rituxan treatment 2006  . Atrial septal aneurysm 2009    Insignificant, no, January, 2009  . Infection of PEG site     cellulitis.Marland KitchenResolved  . Vertebral compression fracture     multiple levels  . Herpes zoster     right chest  . Aortic insufficiency 2009    mild, Mild, echo, 2012  . Diastolic dysfunction     mild  . Chest pain     EF 55%, echo, October, 2009, possible inferior and posterior borderline hypokinesis, patient has never  had cardiac catheterization  . Edema     EF 55%, echo, October, 2009, possible borderline inferior and posterior hypokinesis... heart catheterization has never been done(July 10, 2009)  . Myositis     Caused pulmonary insufficiency with muscle weakness in the past / Dr.Willis-neurology, Imuran and prednisone  . Calculus of kidney   . Inguinal hernia without mention of obstruction or gangrene, unilateral or unspecified, (not specified as recurrent)     right   . Unspecified gastritis  and gastroduodenitis without mention of hemorrhage   . Abdominal pain, epigastric   . Nonspecific elevation of levels of transaminase or lactic acid dehydrogenase (LDH)   . Feeding difficulties and mismanagement   . Edema of male genital organs     Resolved, occurred during illness related to myositis  . Swelling of arm     left.Marland KitchenMarland KitchenResolved.... no DVT  . LFT elevation     Related to Imuran in the past... dose was adjusted  . Dyslipidemia     statins not used due to LFT abnormalities  . Shortness of breath     Breathing abnormalities related to muscle weakness from myositis  . RBBB (right bundle branch block)     Old  . Bradycardia     Sinus bradycardia, old  . Ejection fraction     EF 60%, echo, October, 2012  . Mitral regurgitation     Mild / moderate, echo, 2012  . Right ventricular dysfunction     Mild, echo, 2012  . Febrile illness     May, 2013, question sinusitis  . Polymyositis 11/26/2012    Past Surgical History  Procedure Laterality Date  . Splenectomy    . Hernia repair      left side  . Peg placement  06/2007  . Peg tube removal  12/2007  . Kyphosis surgery      multilevel vertebral  . Odontoid fracture surgery      anterior screw fixation  . Nasal sinus surgery      x2  . Cataract extraction      bi lateral    Patient Active Problem List   Diagnosis Date Noted  . RBBB (right bundle branch block)     Priority: High  . Bradycardia     Priority: High  . Ejection fraction     Priority: High  . Aortic insufficiency     Priority: High  . Right ventricular dysfunction     Priority: High  . Mitral regurgitation     Priority: High  . Diastolic dysfunction     Priority: High  . Chest pain     Priority: High  . Edema     Priority: High  . Dyslipidemia     Priority: High  . Shortness of breath     Priority: High  . Polymyositis 11/26/2012  .  Febrile illness   . CLL (chronic lymphoblastic leukemia)   . Atrial septal aneurysm   . Vertebral  compression fracture   . Herpes zoster   . Myositis   . HYPOKALEMIA 01/19/2009  . CLL 08/01/2008  . HYPERLIPIDEMIA-MIXED 07/22/2008  . ANEMIA 07/22/2008  . NEPHROLITHIASIS 07/22/2008  . EDEMA OF MALE GENITAL ORGANS 07/22/2008  . MYOSITIS 07/22/2008  . OSTEOPOROSIS 07/22/2008  . GASTRITIS 06/30/2008  . INGUINAL HERNIA, RIGHT 06/30/2008  . ABDOMINAL PAIN, EPIGASTRIC 05/29/2008  . FEEDING PROBLEM 12/24/2007  . ABNORMAL TRANSAMINASE-LFT'S 12/24/2007  . DYSPNEA 05/22/2007    ROS   Patient denies fever, chills, headache, sweats, rash, change in vision, change in hearing, chest pain, cough, nausea or vomiting, urinary symptoms. All other systems are reviewed and are negative.  PHYSICAL EXAM  Patient is oriented to person time and place. Affect is normal. He has significant kyphosis of the thoracic spine. There is no jugulovenous distention. Lungs are clear. Respiratory effort is nonlabored. Cardiac exam reveals S1 and S2. There no clicks or significant murmurs. The abdomen is soft. There is no peripheral edema.  Filed Vitals:   06/24/13 1004  BP: 110/50  Pulse: 45  Height: 5\' 5"  (1.651 m)  Weight: 134 lb (60.782 kg)   EKG is done today and reviewed by me. There is old right bundle branch block. There is sinus bradycardia more marked than the past.  ASSESSMENT & PLAN

## 2013-06-24 NOTE — Patient Instructions (Signed)
Your physician recommends that you continue on your current medications as directed. Please refer to the Current Medication list given to you today.  Your physician wants you to follow-up in: 1 year. You will receive a reminder letter in the mail two months in advance. If you don't receive a letter, please call our office to schedule the follow-up appointment.  

## 2013-07-16 ENCOUNTER — Ambulatory Visit: Payer: Self-pay | Admitting: Family Medicine

## 2013-08-07 ENCOUNTER — Other Ambulatory Visit: Payer: Self-pay | Admitting: *Deleted

## 2013-08-07 MED ORDER — POTASSIUM CHLORIDE CRYS ER 20 MEQ PO TBCR: 20.0000 meq | EXTENDED_RELEASE_TABLET | Freq: Every day | ORAL | Status: DC

## 2013-08-07 MED ORDER — FUROSEMIDE 80 MG PO TABS: 80.0000 mg | ORAL_TABLET | Freq: Every day | ORAL | Status: DC

## 2013-09-18 ENCOUNTER — Telehealth: Payer: Self-pay | Admitting: Neurology

## 2013-09-18 NOTE — Telephone Encounter (Signed)
I called patient. The patient is to come in for blood work for a comprehensive metabolic profile, and CBC. The patient indicates that he will be getting blood work through his oncologist in 2 or 3 days. I would ask that the liver and the CBC be checked, blood work sent to me.

## 2013-09-18 NOTE — Telephone Encounter (Signed)
Message copied by Margette Fast on Wed Sep 18, 2013  1:15 PM ------      Message from: Margette Fast      Created: Fri Jun 14, 2013 10:16 AM       Recheck comprehensive metabolic profile, CBC. The patient is on CellCept. ------

## 2013-09-20 ENCOUNTER — Ambulatory Visit: Payer: Self-pay | Admitting: Oncology

## 2013-09-20 LAB — COMPREHENSIVE METABOLIC PANEL
AST: 33 U/L (ref 15–37)
Albumin: 3.9 g/dL (ref 3.4–5.0)
Alkaline Phosphatase: 77 U/L
Anion Gap: 6 — ABNORMAL LOW (ref 7–16)
BILIRUBIN TOTAL: 1 mg/dL (ref 0.2–1.0)
BUN: 18 mg/dL (ref 7–18)
CO2: 31 mmol/L (ref 21–32)
Calcium, Total: 9.2 mg/dL (ref 8.5–10.1)
Chloride: 102 mmol/L (ref 98–107)
Creatinine: 0.92 mg/dL (ref 0.60–1.30)
EGFR (African American): >60
EGFR (Non-African Amer.): >60
Glucose: 98 mg/dL (ref 65–99)
Osmolality: 279 (ref 275–301)
POTASSIUM: 4.4 mmol/L (ref 3.5–5.1)
SGPT (ALT): 26 U/L (ref 12–78)
Sodium: 139 mmol/L (ref 136–145)
Total Protein: 7 g/dL (ref 6.4–8.2)

## 2013-09-20 LAB — CBC CANCER CENTER
BASOS ABS: 0.1 x10 3/mm (ref 0.0–0.1)
Basophil %: 0.7 %
EOS PCT: 1.6 %
Eosinophil #: 0.2 x10 3/mm (ref 0.0–0.7)
HCT: 41.6 % (ref 40.0–52.0)
HGB: 13.4 g/dL (ref 13.0–18.0)
LYMPHS ABS: 6.2 x10 3/mm — AB (ref 1.0–3.6)
Lymphocyte %: 64.4 %
MCH: 32.2 pg (ref 26.0–34.0)
MCHC: 32.2 g/dL (ref 32.0–36.0)
MCV: 100 fL (ref 80–100)
MONOS PCT: 16.4 %
Monocyte #: 1.6 x10 3/mm — ABNORMAL HIGH (ref 0.2–1.0)
NEUTROS PCT: 16.9 %
Neutrophil #: 1.6 x10 3/mm (ref 1.4–6.5)
Platelet: 182 x10 3/mm (ref 150–440)
RBC: 4.17 10*6/uL — AB (ref 4.40–5.90)
RDW: 15.8 % — AB (ref 11.5–14.5)
WBC: 9.6 x10 3/mm (ref 3.8–10.6)

## 2013-09-24 ENCOUNTER — Telehealth: Payer: Self-pay | Admitting: Neurology

## 2013-09-24 NOTE — Telephone Encounter (Signed)
The patient is sent over blood work that was done at an outlying lab. CBC was done, unremarkable. A comprehensive metabolic profile was done, and this was also unremarkable.

## 2013-10-09 ENCOUNTER — Ambulatory Visit: Payer: Self-pay | Admitting: Oncology

## 2013-11-01 ENCOUNTER — Emergency Department (HOSPITAL_COMMUNITY): Payer: Medicare Other

## 2013-11-01 ENCOUNTER — Encounter (HOSPITAL_COMMUNITY): Payer: Self-pay | Admitting: Emergency Medicine

## 2013-11-01 ENCOUNTER — Inpatient Hospital Stay (HOSPITAL_COMMUNITY)
Admission: EM | Admit: 2013-11-01 | Discharge: 2013-11-04 | DRG: 202 | Disposition: A | Discharge status: Home / Self Care | Payer: Medicare Other | Attending: Internal Medicine | Admitting: Internal Medicine

## 2013-11-01 DIAGNOSIS — R0602 Shortness of breath: Secondary | ICD-10-CM

## 2013-11-01 DIAGNOSIS — R001 Bradycardia, unspecified: Secondary | ICD-10-CM

## 2013-11-01 DIAGNOSIS — E785 Hyperlipidemia, unspecified: Secondary | ICD-10-CM

## 2013-11-01 DIAGNOSIS — IMO0001 Reserved for inherently not codable concepts without codable children: Secondary | ICD-10-CM

## 2013-11-01 DIAGNOSIS — K2970 Gastritis, unspecified, without bleeding: Secondary | ICD-10-CM

## 2013-11-01 DIAGNOSIS — N200 Calculus of kidney: Secondary | ICD-10-CM

## 2013-11-01 DIAGNOSIS — R633 Feeding difficulties, unspecified: Secondary | ICD-10-CM

## 2013-11-01 DIAGNOSIS — J329 Chronic sinusitis, unspecified: Secondary | ICD-10-CM | POA: Diagnosis present

## 2013-11-01 DIAGNOSIS — K2990 Gastroduodenitis, unspecified, without bleeding: Secondary | ICD-10-CM

## 2013-11-01 DIAGNOSIS — R7401 Elevation of levels of liver transaminase levels: Secondary | ICD-10-CM

## 2013-11-01 DIAGNOSIS — D649 Anemia, unspecified: Secondary | ICD-10-CM

## 2013-11-01 DIAGNOSIS — I4510 Unspecified right bundle-branch block: Secondary | ICD-10-CM

## 2013-11-01 DIAGNOSIS — I340 Nonrheumatic mitral (valve) insufficiency: Secondary | ICD-10-CM

## 2013-11-01 DIAGNOSIS — J209 Acute bronchitis, unspecified: Principal | ICD-10-CM | POA: Diagnosis present

## 2013-11-01 DIAGNOSIS — I519 Heart disease, unspecified: Secondary | ICD-10-CM

## 2013-11-01 DIAGNOSIS — Z87891 Personal history of nicotine dependence: Secondary | ICD-10-CM | POA: Diagnosis not present

## 2013-11-01 DIAGNOSIS — R7402 Elevation of levels of lactic acid dehydrogenase (LDH): Secondary | ICD-10-CM

## 2013-11-01 DIAGNOSIS — Z7982 Long term (current) use of aspirin: Secondary | ICD-10-CM | POA: Diagnosis not present

## 2013-11-01 DIAGNOSIS — R509 Fever, unspecified: Secondary | ICD-10-CM

## 2013-11-01 DIAGNOSIS — M3320 Polymyositis, organ involvement unspecified: Secondary | ICD-10-CM | POA: Diagnosis present

## 2013-11-01 DIAGNOSIS — K4090 Unilateral inguinal hernia, without obstruction or gangrene, not specified as recurrent: Secondary | ICD-10-CM

## 2013-11-01 DIAGNOSIS — Z79899 Other long term (current) drug therapy: Secondary | ICD-10-CM

## 2013-11-01 DIAGNOSIS — R609 Edema, unspecified: Secondary | ICD-10-CM

## 2013-11-01 DIAGNOSIS — M609 Myositis, unspecified: Secondary | ICD-10-CM

## 2013-11-01 DIAGNOSIS — C9110 Chronic lymphocytic leukemia of B-cell type not having achieved remission: Secondary | ICD-10-CM

## 2013-11-01 DIAGNOSIS — I498 Other specified cardiac arrhythmias: Secondary | ICD-10-CM | POA: Diagnosis present

## 2013-11-01 DIAGNOSIS — I351 Nonrheumatic aortic (valve) insufficiency: Secondary | ICD-10-CM

## 2013-11-01 DIAGNOSIS — I5189 Other ill-defined heart diseases: Secondary | ICD-10-CM

## 2013-11-01 DIAGNOSIS — R0902 Hypoxemia: Secondary | ICD-10-CM

## 2013-11-01 DIAGNOSIS — N5089 Other specified disorders of the male genital organs: Secondary | ICD-10-CM

## 2013-11-01 DIAGNOSIS — R740 Nonspecific elevation of levels of transaminase and lactic acid dehydrogenase [LDH]: Secondary | ICD-10-CM

## 2013-11-01 DIAGNOSIS — E876 Hypokalemia: Secondary | ICD-10-CM

## 2013-11-01 DIAGNOSIS — M810 Age-related osteoporosis without current pathological fracture: Secondary | ICD-10-CM

## 2013-11-01 DIAGNOSIS — IMO0002 Reserved for concepts with insufficient information to code with codable children: Secondary | ICD-10-CM

## 2013-11-01 DIAGNOSIS — Z66 Do not resuscitate: Secondary | ICD-10-CM | POA: Diagnosis present

## 2013-11-01 DIAGNOSIS — I253 Aneurysm of heart: Secondary | ICD-10-CM

## 2013-11-01 DIAGNOSIS — R9430 Abnormal result of cardiovascular function study, unspecified: Secondary | ICD-10-CM

## 2013-11-01 DIAGNOSIS — R1013 Epigastric pain: Secondary | ICD-10-CM

## 2013-11-01 LAB — COMPREHENSIVE METABOLIC PANEL
ALT: 13 U/L (ref 0–53)
ANION GAP: 9 (ref 5–15)
AST: 26 U/L (ref 0–37)
Albumin: 3.6 g/dL (ref 3.5–5.2)
Alkaline Phosphatase: 76 U/L (ref 39–117)
BILIRUBIN TOTAL: 1 mg/dL (ref 0.3–1.2)
BUN: 14 mg/dL (ref 6–23)
CHLORIDE: 101 meq/L (ref 96–112)
CO2: 28 meq/L (ref 19–32)
Calcium: 9.2 mg/dL (ref 8.4–10.5)
Creatinine, Ser: 0.87 mg/dL (ref 0.50–1.35)
GFR, EST NON AFRICAN AMERICAN: 79 mL/min — AB (ref >90)
GLUCOSE: 85 mg/dL (ref 70–99)
Potassium: 4.9 mEq/L (ref 3.7–5.3)
SODIUM: 138 meq/L (ref 137–147)
TOTAL PROTEIN: 6.6 g/dL (ref 6.0–8.3)

## 2013-11-01 LAB — CBC
HCT: 42.8 % (ref 39.0–52.0)
Hemoglobin: 13.4 g/dL (ref 13.0–17.0)
MCH: 32 pg (ref 26.0–34.0)
MCHC: 31.3 g/dL (ref 30.0–36.0)
MCV: 102.1 fL — ABNORMAL HIGH (ref 78.0–100.0)
PLATELETS: 157 10*3/uL (ref 150–400)
RBC: 4.19 MIL/uL — ABNORMAL LOW (ref 4.22–5.81)
RDW: 15.9 % — AB (ref 11.5–15.5)
WBC: 12.1 10*3/uL — AB (ref 4.0–10.5)

## 2013-11-01 LAB — I-STAT TROPONIN, ED: Troponin i, poc: 0 ng/mL (ref 0.00–0.08)

## 2013-11-01 LAB — TROPONIN I

## 2013-11-01 LAB — PRO B NATRIURETIC PEPTIDE: Pro B Natriuretic peptide (BNP): 283.7 pg/mL (ref 0–450)

## 2013-11-01 LAB — LACTIC ACID, PLASMA: Lactic Acid, Venous: 1.1 mmol/L (ref 0.5–2.2)

## 2013-11-01 MED ORDER — ALUM & MAG HYDROXIDE-SIMETH 200-200-20 MG/5ML PO SUSP
30.0000 mL | Freq: Four times a day (QID) | ORAL | Status: DC | PRN
  Administered 2013-11-03: 30 mL via ORAL
  Filled 2013-11-01: qty 30

## 2013-11-01 MED ORDER — DEXTROMETHORPHAN POLISTIREX 30 MG/5ML PO LQCR
30.0000 mg | Freq: Two times a day (BID) | ORAL | Status: DC | PRN
  Filled 2013-11-01: qty 5

## 2013-11-01 MED ORDER — LEVOFLOXACIN IN D5W 500 MG/100ML IV SOLN
500.0000 mg | Freq: Once | INTRAVENOUS | Status: AC
  Administered 2013-11-01: 500 mg via INTRAVENOUS
  Filled 2013-11-01: qty 100

## 2013-11-01 MED ORDER — VITAMIN B-12 1000 MCG PO TABS
1000.0000 ug | ORAL_TABLET | Freq: Every day | ORAL | Status: DC
  Administered 2013-11-02 – 2013-11-04 (×3): 1000 ug via ORAL
  Filled 2013-11-01 (×4): qty 1

## 2013-11-01 MED ORDER — SODIUM CHLORIDE 0.9 % IJ SOLN
3.0000 mL | Freq: Two times a day (BID) | INTRAMUSCULAR | Status: DC
  Administered 2013-11-01 – 2013-11-04 (×4): 3 mL via INTRAVENOUS

## 2013-11-01 MED ORDER — SODIUM CHLORIDE 0.9 % IJ SOLN: 3.0000 mL | INTRAMUSCULAR | Status: DC | PRN

## 2013-11-01 MED ORDER — METHYLPREDNISOLONE SODIUM SUCC 40 MG IJ SOLR
40.0000 mg | Freq: Every day | INTRAMUSCULAR | Status: DC
  Administered 2013-11-02 – 2013-11-04 (×3): 40 mg via INTRAVENOUS
  Filled 2013-11-01 (×4): qty 1

## 2013-11-01 MED ORDER — ONDANSETRON HCL 4 MG/2ML IJ SOLN: 4.0000 mg | Freq: Four times a day (QID) | INTRAMUSCULAR | Status: DC | PRN

## 2013-11-01 MED ORDER — ACETAMINOPHEN 650 MG RE SUPP: 650.0000 mg | Freq: Four times a day (QID) | RECTAL | Status: DC | PRN

## 2013-11-01 MED ORDER — OMEGA-3 FATTY ACIDS 1000 MG PO CAPS: 1.0000 g | ORAL_CAPSULE | Freq: Every day | ORAL | Status: DC

## 2013-11-01 MED ORDER — ASPIRIN EC 81 MG PO TBEC
81.0000 mg | DELAYED_RELEASE_TABLET | Freq: Every day | ORAL | Status: DC
  Administered 2013-11-01 – 2013-11-04 (×4): 81 mg via ORAL
  Filled 2013-11-01 (×4): qty 1

## 2013-11-01 MED ORDER — SODIUM CHLORIDE 0.9 % IJ SOLN
3.0000 mL | Freq: Two times a day (BID) | INTRAMUSCULAR | Status: DC
  Administered 2013-11-02: 3 mL via INTRAVENOUS

## 2013-11-01 MED ORDER — CYANOCOBALAMIN 1000 MCG/ML IJ SOLN: 1000.0000 ug | INTRAMUSCULAR | Status: DC

## 2013-11-01 MED ORDER — TEMAZEPAM 15 MG PO CAPS
30.0000 mg | ORAL_CAPSULE | Freq: Every day | ORAL | Status: DC
  Administered 2013-11-01 – 2013-11-03 (×3): 30 mg via ORAL
  Filled 2013-11-01 (×3): qty 2

## 2013-11-01 MED ORDER — FUROSEMIDE 40 MG PO TABS
40.0000 mg | ORAL_TABLET | Freq: Every day | ORAL | Status: DC
  Administered 2013-11-02 – 2013-11-03 (×2): 40 mg via ORAL
  Filled 2013-11-01 (×3): qty 1

## 2013-11-01 MED ORDER — POLYETHYLENE GLYCOL 3350 17 G PO PACK
17.0000 g | PACK | Freq: Every day | ORAL | Status: DC | PRN
  Filled 2013-11-01: qty 1

## 2013-11-01 MED ORDER — POTASSIUM CHLORIDE CRYS ER 20 MEQ PO TBCR
20.0000 meq | EXTENDED_RELEASE_TABLET | Freq: Every day | ORAL | Status: DC
  Administered 2013-11-02 – 2013-11-03 (×2): 20 meq via ORAL
  Filled 2013-11-01 (×3): qty 1

## 2013-11-01 MED ORDER — OMEGA-3-ACID ETHYL ESTERS 1 G PO CAPS
1.0000 g | ORAL_CAPSULE | Freq: Every day | ORAL | Status: DC
  Administered 2013-11-02 – 2013-11-04 (×3): 1 g via ORAL
  Filled 2013-11-01 (×4): qty 1

## 2013-11-01 MED ORDER — MYCOPHENOLATE MOFETIL 250 MG PO CAPS
500.0000 mg | ORAL_CAPSULE | Freq: Two times a day (BID) | ORAL | Status: DC
  Administered 2013-11-01 – 2013-11-04 (×6): 500 mg via ORAL
  Filled 2013-11-01 (×7): qty 2

## 2013-11-01 MED ORDER — ENOXAPARIN SODIUM 40 MG/0.4ML ~~LOC~~ SOLN
40.0000 mg | SUBCUTANEOUS | Status: DC
  Filled 2013-11-01 (×4): qty 0.4

## 2013-11-01 MED ORDER — GUAIFENESIN ER 600 MG PO TB12
1200.0000 mg | ORAL_TABLET | Freq: Two times a day (BID) | ORAL | Status: DC
  Administered 2013-11-02 – 2013-11-04 (×5): 1200 mg via ORAL
  Filled 2013-11-01 (×7): qty 2

## 2013-11-01 MED ORDER — ONDANSETRON HCL 4 MG PO TABS: 4.0000 mg | ORAL_TABLET | Freq: Four times a day (QID) | ORAL | Status: DC | PRN

## 2013-11-01 MED ORDER — ACETAMINOPHEN 325 MG PO TABS
650.0000 mg | ORAL_TABLET | Freq: Four times a day (QID) | ORAL | Status: DC | PRN
  Administered 2013-11-02 – 2013-11-04 (×7): 650 mg via ORAL
  Filled 2013-11-01 (×8): qty 2

## 2013-11-01 MED ORDER — FERROUS SULFATE 325 (65 FE) MG PO TABS
325.0000 mg | ORAL_TABLET | Freq: Every day | ORAL | Status: DC
  Administered 2013-11-02 – 2013-11-04 (×3): 325 mg via ORAL
  Filled 2013-11-01 (×5): qty 1

## 2013-11-01 MED ORDER — SODIUM CHLORIDE 0.9 % IV SOLN
Freq: Once | INTRAVENOUS | Status: AC
  Administered 2013-11-01: 16:00:00 via INTRAVENOUS

## 2013-11-01 MED ORDER — HYDROCODONE-ACETAMINOPHEN 5-325 MG PO TABS
1.0000 | ORAL_TABLET | Freq: Four times a day (QID) | ORAL | Status: DC | PRN
  Administered 2013-11-02 – 2013-11-04 (×5): 1 via ORAL
  Filled 2013-11-01 (×5): qty 1

## 2013-11-01 MED ORDER — LIDOCAINE VISCOUS 2 % MT SOLN: 15.0000 mL | Freq: Once | OROMUCOSAL | Status: DC

## 2013-11-01 MED ORDER — CITALOPRAM HYDROBROMIDE 40 MG PO TABS
40.0000 mg | ORAL_TABLET | Freq: Every day | ORAL | Status: DC
  Administered 2013-11-02 – 2013-11-04 (×3): 40 mg via ORAL
  Filled 2013-11-01 (×3): qty 1

## 2013-11-01 MED ORDER — CALCIUM CARBONATE-VITAMIN D 500-200 MG-UNIT PO TABS
1.0000 | ORAL_TABLET | Freq: Two times a day (BID) | ORAL | Status: DC
  Administered 2013-11-02 – 2013-11-04 (×5): 1 via ORAL
  Filled 2013-11-01 (×7): qty 1

## 2013-11-01 MED ORDER — LEVOFLOXACIN IN D5W 500 MG/100ML IV SOLN
500.0000 mg | INTRAVENOUS | Status: DC
  Administered 2013-11-02: 500 mg via INTRAVENOUS
  Filled 2013-11-01: qty 100

## 2013-11-01 MED ORDER — IPRATROPIUM-ALBUTEROL 0.5-2.5 (3) MG/3ML IN SOLN
3.0000 mL | Freq: Four times a day (QID) | RESPIRATORY_TRACT | Status: DC
  Administered 2013-11-01 – 2013-11-02 (×4): 3 mL via RESPIRATORY_TRACT
  Filled 2013-11-01 (×6): qty 3

## 2013-11-01 MED ORDER — SODIUM CHLORIDE 0.9 % IV SOLN
250.0000 mL | INTRAVENOUS | Status: DC | PRN
Start: 2013-11-01 — End: 2013-11-04

## 2013-11-01 MED ORDER — ADULT MULTIVITAMIN W/MINERALS CH
1.0000 | ORAL_TABLET | Freq: Every day | ORAL | Status: DC
  Administered 2013-11-02 – 2013-11-04 (×3): 1 via ORAL
  Filled 2013-11-01 (×4): qty 1

## 2013-11-01 MED ORDER — ASPIRIN EC 81 MG PO TBEC: 81.0000 mg | DELAYED_RELEASE_TABLET | Freq: Every day | ORAL | Status: DC

## 2013-11-01 MED ORDER — MULTIVITAMINS PO CAPS: 1.0000 | ORAL_CAPSULE | Freq: Every day | ORAL | Status: DC

## 2013-11-01 MED ORDER — VITAMIN D3 25 MCG (1000 UNIT) PO TABS
1000.0000 [IU] | ORAL_TABLET | Freq: Every day | ORAL | Status: DC
  Administered 2013-11-02 – 2013-11-03 (×2): 1000 [IU] via ORAL
  Filled 2013-11-01 (×4): qty 1

## 2013-11-01 NOTE — ED Notes (Signed)
Pt states he has had trouble with a cough, fever, chills, and body aches since Wednesday. At MD office this morning and sats were 92%. Fever as high as 102 while at home. Pt states he had an episode of confusion last night where he couldn't figure out how to get in the bed. States his feet were tangled. NAD at this time.

## 2013-11-01 NOTE — H&P (Signed)
Pt seen and examined. Agree with assessment and plan as outlined above. Briefly, pt presents with fevers and cough x several days. CXR in ED neg for acute process. Pt mildly bradycardic, but this appears to be baseline. Lungs with B wheezing on exam. Levaquin started in the ED. Agree with scheduled nebs and steroids. Cont to wean O2 as tolerated. Agree with 2d echo to eval heart function.

## 2013-11-01 NOTE — ED Provider Notes (Signed)
CSN: [id number]     Arrival date & time 11/01/13  1129 History   First MD Initiated Contact with Patient 11/01/13 1149     Chief Complaint  Patient presents with  . Shortness of Breath  . Cough     (Consider location/radiation/quality/duration/timing/severity/associated sxs/prior Treatment) HPI Patient presents with one week of cough, chills, fever, aches. Symptoms began without clear precipitant, since onset has been persistent, with no clear alleviating or exacerbating factors. There is no associated focal pain, confusion, disorientation, the patient was lightheaded, dizzy, earlier today. Patient was at his 50 office, found to have hypoxia, 92%, as well as fever, 102   Past Medical History  Diagnosis Date  . CLL (chronic lymphoblastic leukemia) 2006    Rituxan treatment 2006  . Atrial septal aneurysm 2009    Insignificant, no, January, 2009  . Infection of PEG site     cellulitis.Marland KitchenResolved  . Vertebral compression fracture     multiple levels  . Herpes zoster     right chest  . Aortic insufficiency 2009    mild, Mild, echo, 2012  . Diastolic dysfunction     mild  . Chest pain     EF 55%, echo, October, 2009, possible inferior and posterior borderline hypokinesis, patient has never  had cardiac catheterization  . Edema     EF 55%, echo, October, 2009, possible borderline inferior and posterior hypokinesis... heart catheterization has never been done(July 10, 2009)  . Myositis     Caused pulmonary insufficiency with muscle weakness in the past / Dr.Willis-neurology, Imuran and prednisone  . Calculus of kidney   . Inguinal hernia without mention of obstruction or gangrene, unilateral or unspecified, (not specified as recurrent)     right   . Unspecified gastritis and gastroduodenitis without mention of hemorrhage   . Abdominal pain, epigastric   . Nonspecific elevation of levels of transaminase or lactic acid dehydrogenase (LDH)   . Feeding difficulties and  mismanagement   . Edema of male genital organs     Resolved, occurred during illness related to myositis  . Swelling of arm     left.Marland KitchenMarland KitchenResolved.... no DVT  . LFT elevation     Related to Imuran in the past... dose was adjusted  . Dyslipidemia     statins not used due to LFT abnormalities  . Shortness of breath     Breathing abnormalities related to muscle weakness from myositis  . RBBB (right bundle branch block)     Old  . Bradycardia     Sinus bradycardia, old  . Ejection fraction     EF 60%, echo, October, 2012  . Mitral regurgitation     Mild / moderate, echo, 2012  . Right ventricular dysfunction     Mild, echo, 2012  . Febrile illness     May, 2013, question sinusitis  . Polymyositis 11/26/2012   Past Surgical History  Procedure Laterality Date  . Splenectomy    . Hernia repair      left side  . Peg placement  06/2007  . Peg tube removal  12/2007  . Kyphosis surgery      multilevel vertebral  . Odontoid fracture surgery      anterior screw fixation  . Nasal sinus surgery      x2  . Cataract extraction      bi lateral   Family History  Problem Relation Age of Onset  . Breast cancer Sister   . Colon cancer Brother   . Prostate  cancer Brother   . Stroke Mother   . Emphysema Father   . Diabetes Grandchild    History  Substance Use Topics  . Smoking status: Former Smoker -- 0.50 packs/day for 31 years    Types: Cigarettes    Quit date: 06/29/1980  . Smokeless tobacco: Never Used  . Alcohol Use: Yes     Comment: rarely    Review of Systems  Constitutional:       Per HPI, otherwise negative  HENT:       Per HPI, otherwise negative  Respiratory:       Per HPI, otherwise negative  Cardiovascular:       Per HPI, otherwise negative  Gastrointestinal: Negative for vomiting.  Endocrine:       Negative aside from HPI  Genitourinary:       Neg aside from HPI   Musculoskeletal:       Per HPI, otherwise negative  Skin: Negative.   Neurological: Negative  for syncope.      Allergies  Sulfonamide derivatives  Home Medications   Prior to Admission medications   Medication Sig Start Date End Date Taking? Authorizing Provider  aspirin 81 MG tablet Take 81 mg by mouth daily.   Yes Historical Provider, MD  calcium-vitamin D (CALCIUM 500+D) 500-200 MG-UNIT per tablet Take 1 tablet by mouth 2 (two) times daily.     Yes Historical Provider, MD  Cholecalciferol (VITAMIN D3) 1000 UNITS tablet Take 1,000 Units by mouth daily.     Yes Historical Provider, MD  citalopram (CELEXA) 40 MG tablet Take 40 mg by mouth daily.  10/23/11  Yes Historical Provider, MD  cyanocobalamin (,VITAMIN B-12,) 1000 MCG/ML injection Inject 1,000 mcg into the muscle every 30 (thirty) days. At the first of the month   Yes Historical Provider, MD  ferrous sulfate 325 (65 FE) MG tablet Take 325 mg by mouth daily with breakfast.     Yes Historical Provider, MD  fish oil-omega-3 fatty acids 1000 MG capsule Take 1 g by mouth daily.    Yes Historical Provider, MD  furosemide (LASIX) 40 MG tablet Take 40 mg by mouth daily.   Yes Historical Provider, MD  HYDROcodone-acetaminophen (NORCO/VICODIN) 5-325 MG per tablet Take 1 tablet by mouth every 6 (six) hours as needed for pain.   Yes Historical Provider, MD  Multiple Vitamin (MULTIVITAMIN) capsule Take 1 capsule by mouth daily.     Yes Historical Provider, MD  mycophenolate (CELLCEPT) 500 MG tablet Take 1 tablet (500 mg total) by mouth 2 (two) times daily. 05/13/13  Yes Kathrynn Ducking, MD  potassium chloride SA (KLOR-CON M20) 20 MEQ tablet Take 1 tablet (20 mEq total) by mouth daily. Take one tablet by mouth once daily. Take an extra tablet twice a week. 08/07/13  Yes Carlena Bjornstad, MD  Resource Beneprotein PACK Take 3g three times a day   Yes Historical Provider, MD  temazepam (RESTORIL) 30 MG capsule Take 30 mg by mouth at bedtime.     Yes Historical Provider, MD  vitamin B-12 (CYANOCOBALAMIN) 1000 MCG tablet Take 1,000 mcg by mouth  daily.     Yes Historical Provider, MD   BP 119/53  Pulse 49  Temp(Src) 98 F (36.7 C) (Oral)  Resp 22  Ht 5\' 5"  (1.651 m)  Wt 125 lb (56.7 kg)  BMI 20.80 kg/m2  SpO2 93% Physical Exam  Nursing note and vitals reviewed. Constitutional: He is oriented to person, place, and time. He appears well-developed. No  distress.  HENT:  Head: Normocephalic and atraumatic.  Eyes: Conjunctivae and EOM are normal.  Cardiovascular: Normal rate and regular rhythm.   Pulmonary/Chest: Effort normal. No stridor. No respiratory distress.  Abdominal: He exhibits no distension.  Musculoskeletal: He exhibits no edema.  Neurological: He is alert and oriented to person, place, and time.  Skin: Skin is warm and dry.  Psychiatric: He has a normal mood and affect.    ED Course  Procedures (including critical care time) Labs Review Labs Reviewed  CBC - Abnormal; Notable for the following:    WBC 12.1 (*)    RBC 4.19 (*)    MCV 102.1 (*)    RDW 15.9 (*)    All other components within normal limits  COMPREHENSIVE METABOLIC PANEL - Abnormal; Notable for the following:    GFR calc non Af Amer 79 (*)    All other components within normal limits  TROPONIN I  PRO B NATRIURETIC PEPTIDE  LACTIC ACID, PLASMA  I-STAT TROPOININ, ED    Imaging Review Dg Chest 2 View  11/01/2013   CLINICAL DATA:  Two day history of cough, fever and shortness of breath.  EXAM: CHEST  2 VIEW  COMPARISON:  Two-view chest x-ray 07/16/2008. One view chest x-ray 11/17/2007.  FINDINGS: Cardiac silhouette mildly enlarged but stable. Thoracic aorta tortuous and atherosclerotic, unchanged. Prominent central pulmonary arteries, unchanged. Hyperinflation and emphysematous changes in both lungs, unchanged. Linear scarring in the left lower lobe, unchanged. Lungs otherwise clear. No localized airspace consolidation. No pleural effusions. No pneumothorax. Normal pulmonary vascularity. Multiple thoracic and upper lumbar spine fractures which  have undergone prior augmentation, accounting for exaggeration of the usual thoracic kyphosis. Severe generalized osseous demineralization.  IMPRESSION: 1. COPD/emphysema. Stable left lower lobe scarring. No acute cardiopulmonary disease. 2. Stable mild cardiomegaly without pulmonary edema.   Electronically Signed   By: Evangeline Dakin M.D.   On: 11/01/2013 12:18     EKG Interpretation   Date/Time:  Friday November 01 2013 11:36:56 EDT Ventricular Rate:  51 PR Interval:  202 QRS Duration: 158 QT Interval:  478 QTC Calculation: 440 R Axis:   57 Text Interpretation:  Sinus bradycardia Right bundle branch block Abnormal  ECG Sinus bradycardia Right bundle branch block T wave abnormality  Abnormal ekg Confirmed by Carmin Muskrat  MD ([id number]) on 11/01/2013  11:50:29 AM       2:43 PM On exam the patient has hypoxia on room air, 90%, this corrects to 99%. Patient denies chest pain. Patient complains of sore throat, which is new. On exam patient has mild erythema in the posterior oropharynx, no swelling, no asymmetry.  MDM   Patient presents with concerns cough, fatigue, fever. Patient history polymyositis, no history of pulmonary disease. With hypoxia, leukocytosis, the patient is afebrile here, there is some concern for early infection.  Given his comorbidities, he will be admitted after initiation of antibiotics for community acquired pneumonia.   Carmin Muskrat, MD 11/01/13 1444

## 2013-11-01 NOTE — Progress Notes (Signed)
Pt admitted to the unit at 1652. Pt mental status is Alert and oriented x 4. Pt oriented to room, staff, and call bell. Skin is intact except for skin cancer removal site to rt. Side of nose. Full assessment charted in [facility]. Call bell within reach. Visitor guidelines reviewed w/ pt and/or family.

## 2013-11-01 NOTE — Progress Notes (Signed)
Received report from nurse in the ED. Awaiting pt arrival to the unit.

## 2013-11-01 NOTE — ED Notes (Signed)
This RN attempted report to 5W, RN unable to take report at this time.

## 2013-11-01 NOTE — H&P (Signed)
[facility]                                                                                    Patient Demographics  [name] [name], is a 80 y.o. male  MRN: [id number]   DOB - 24-Nov-1929  Admit Date - 11/01/2013  Outpatient Primary MD for the patient is Otilio Miu, MD Primary Cardiologist: Dr. Ron Parker   With History of   Past Medical History  Diagnosis Date  . CLL (chronic lymphoblastic leukemia) 2006    Rituxan treatment 2006  . Atrial septal aneurysm 2009    Insignificant, no, January, 2009  . Infection of PEG site     cellulitis.Marland KitchenResolved  . Vertebral compression fracture     multiple levels  . Herpes zoster     right chest  . Aortic insufficiency 2009    mild, Mild, echo, 2012  . Diastolic dysfunction     mild  . Chest pain     EF 55%, echo, October, 2009, possible inferior and posterior borderline hypokinesis, patient has never  had cardiac catheterization  . Edema     EF 55%, echo, October, 2009, possible borderline inferior and posterior hypokinesis... heart catheterization has never been done(July 10, 2009)  . Myositis     Caused pulmonary insufficiency with muscle weakness in the past / Dr.Willis-neurology, Imuran and prednisone  . Calculus of kidney   . Inguinal hernia without mention of obstruction or gangrene, unilateral or unspecified, (not specified as recurrent)     right   . Unspecified gastritis and gastroduodenitis without mention of hemorrhage   . Abdominal pain, epigastric   . Nonspecific elevation of levels of transaminase or lactic acid dehydrogenase (LDH)   . Feeding difficulties and mismanagement   . Edema of male genital organs     Resolved, occurred during illness related to myositis  . Swelling of arm     left.Marland KitchenMarland KitchenResolved.... no DVT  . LFT elevation     Related to Imuran in the past... dose was adjusted  . Dyslipidemia     statins not used due to LFT abnormalities  . Shortness of breath     Breathing abnormalities related to muscle  weakness from myositis  . RBBB (right bundle branch block)     Old  . Bradycardia     Sinus bradycardia, old  . Ejection fraction     EF 60%, echo, October, 2012  . Mitral regurgitation     Mild / moderate, echo, 2012  . Right ventricular dysfunction     Mild, echo, 2012  . Febrile illness     May, 2013, question sinusitis  . Polymyositis 11/26/2012      Past Surgical History  Procedure Laterality Date  . Splenectomy    . Hernia repair      left side  . Peg placement  06/2007  . Peg tube removal  12/2007  . Kyphosis surgery      multilevel vertebral  . Odontoid fracture surgery      anterior screw fixation  . Nasal sinus surgery      x2  . Cataract extraction      bi lateral    in  for   Chief Complaint  Patient presents with  . Shortness of Breath  . Cough     HPI  [name] [name]  is a 80 y.o. male, PMH of polymyositis on immunosuppressive therapy, bradycardia, CLL presents with fever of 102, chills, rigor, cough, and hypoxia. Symptoms began Wednesday 7/22. Lives alone, usually very independent and self-sufficient. States once symptoms began, his energy and drive for ADL were significantly decreased. States he has never experienced these symptoms before and no one with close contact to him has similar symptoms. Has been felt weak overall, has been coughing up yellow colored sputum and has noticed some shortness of breath, but denies lightheadedness, nausea, vomiting, diarrhea, chest pain, or abdominal pain. Denies home oxygen use and home inhaler/nebulizers. Positive smoking history but states he quit a long time ago, approximately around 1970. Denies alcohol use.   Review of Systems    In addition to the HPI above,  No Headache, No changes with Vision or hearing, No problems swallowing food or Liquids, No Chest pain No Abdominal pain, No Nausea or Vomiting, Bowel movements are regular. No Blood in stool or Urine, No dysuria, No new skin rashes or bruises, No new  joints pains-aches,  No new weakness, tingling, numbness in any extremity, No recent weight gain or loss, No polyuria, polydypsia or polyphagia, No significant Mental Stressors.  A full 10 point Review of Systems was done, except as stated above, all other Review of Systems were negative.   Social History History  Substance Use Topics  . Smoking status: Former Smoker -- 0.50 packs/day for 31 years    Types: Cigarettes    Quit date: 06/29/1980  . Smokeless tobacco: Never Used  . Alcohol Use: Yes     Comment: rarely     Family History Family History  Problem Relation Age of Onset  . Breast cancer Sister   . Colon cancer Brother   . Prostate cancer Brother   . Stroke Mother   . Emphysema Father   . Diabetes Grandchild      Prior to Admission medications   Medication Sig Start Date End Date Taking? Authorizing Provider  aspirin 81 MG tablet Take 81 mg by mouth daily.   Yes Historical Provider, MD  calcium-vitamin D (CALCIUM 500+D) 500-200 MG-UNIT per tablet Take 1 tablet by mouth 2 (two) times daily.     Yes Historical Provider, MD  Cholecalciferol (VITAMIN D3) 1000 UNITS tablet Take 1,000 Units by mouth daily.     Yes Historical Provider, MD  citalopram (CELEXA) 40 MG tablet Take 40 mg by mouth daily.  10/23/11  Yes Historical Provider, MD  cyanocobalamin (,VITAMIN B-12,) 1000 MCG/ML injection Inject 1,000 mcg into the muscle every 30 (thirty) days. At the first of the month   Yes Historical Provider, MD  ferrous sulfate 325 (65 FE) MG tablet Take 325 mg by mouth daily with breakfast.     Yes Historical Provider, MD  fish oil-omega-3 fatty acids 1000 MG capsule Take 1 g by mouth daily.    Yes Historical Provider, MD  furosemide (LASIX) 40 MG tablet Take 40 mg by mouth daily.   Yes Historical Provider, MD  HYDROcodone-acetaminophen (NORCO/VICODIN) 5-325 MG per tablet Take 1 tablet by mouth every 6 (six) hours as needed for pain.   Yes Historical Provider, MD  Multiple Vitamin  (MULTIVITAMIN) capsule Take 1 capsule by mouth daily.     Yes Historical Provider, MD  mycophenolate (CELLCEPT) 500 MG tablet Take 1 tablet (500 mg  total) by mouth 2 (two) times daily. 05/13/13  Yes Kathrynn Ducking, MD  potassium chloride SA (KLOR-CON M20) 20 MEQ tablet Take 1 tablet (20 mEq total) by mouth daily. Take one tablet by mouth once daily. Take an extra tablet twice a week. 08/07/13  Yes Carlena Bjornstad, MD  Resource Beneprotein PACK Take 3g three times a day   Yes Historical Provider, MD  temazepam (RESTORIL) 30 MG capsule Take 30 mg by mouth at bedtime.     Yes Historical Provider, MD  vitamin B-12 (CYANOCOBALAMIN) 1000 MCG tablet Take 1,000 mcg by mouth daily.     Yes Historical Provider, MD    Allergies  Allergen Reactions  . Sulfonamide Derivatives     Physical Exam  Vitals  Blood pressure 139/61, pulse 60, temperature 98 F (36.7 C), temperature source Oral, resp. rate 21, height 5\' 5"  (1.651 m), weight 56.7 kg (125 lb), SpO2 91.00%.   General:  lying in bed in NAD,   Psych:  Normal affect and insight, Not Suicidal or Homicidal, Awake Alert, Oriented X 3.  Neuro:   No F.N deficits, ALL C.Nerves Intact, Strength 5/5 all 4 extremities, Sensation intact all 4 extremities, Plantars down going.  ENT:  Ears and Eyes appear Normal, Conjunctivae clear, PERRLA. Moist Oral Mucosa.  Neck:  Supple Neck, Mild JVD, No cervical lymphadenopathy appreciated, No Carotid Bruits.  Respiratory:  Symmetrical Chest wall movement, Good air movement bilaterally, evidence of rhonchi on inspiration and expiration with expiratory wheeze as well.   Cardiac:  Bradycardic, Regular rhythm, No Gallops, Rubs or Murmurs, No Parasternal Heave.  Abdomen:  Positive Bowel Sounds, Abdomen Soft, Non tender, No organomegaly appreciated  Skin:  No Cyanosis, Normal Skin Turgor, No Skin Rash or Bruise.  Extremities:  Good muscle tone,  joints appear normal , no effusions, Normal ROM.   Data  Review  CBC  Recent Labs Lab 11/01/13 1150  WBC 12.1*  HGB 13.4  HCT 42.8  PLT 157  MCV 102.1*  MCH 32.0  MCHC 31.3  RDW 15.9*   ------------------------------------------------------------------------------------------------------------------  Chemistries   Recent Labs Lab 11/01/13 1150  NA 138  K 4.9  CL 101  CO2 28  GLUCOSE 85  BUN 14  CREATININE 0.87  CALCIUM 9.2  AST 26  ALT 13  ALKPHOS 76  BILITOT 1.0    Cardiac Enzymes  Recent Labs Lab 11/01/13 1150  TROPONINI <0.30   Imaging results:   Dg Chest 2 View  11/01/2013   CLINICAL DATA:  Two day history of cough, fever and shortness of breath.  EXAM: CHEST  2 VIEW  COMPARISON:  Two-view chest x-ray 07/16/2008. One view chest x-ray 11/17/2007.  FINDINGS: Cardiac silhouette mildly enlarged but stable. Thoracic aorta tortuous and atherosclerotic, unchanged. Prominent central pulmonary arteries, unchanged. Hyperinflation and emphysematous changes in both lungs, unchanged. Linear scarring in the left lower lobe, unchanged. Lungs otherwise clear. No localized airspace consolidation. No pleural effusions. No pneumothorax. Normal pulmonary vascularity. Multiple thoracic and upper lumbar spine fractures which have undergone prior augmentation, accounting for exaggeration of the usual thoracic kyphosis. Severe generalized osseous demineralization.  IMPRESSION: 1. COPD/emphysema. Stable left lower lobe scarring. No acute cardiopulmonary disease. 2. Stable mild cardiomegaly without pulmonary edema.   Electronically Signed   By: Evangeline Dakin M.D.   On: 11/01/2013 12:18    My personal review of EKG: Rhythm bradycardia, RBB, no acute changes     Assessment & Plan  Active Problems:   Hypoxia  1. Hypoxia (92%) -  secondary to acute bronchitis +/- slight volume overload - no supplemental oxygen use at home - 2L received in ED; sats remain ~ 95% - CXR negative for effusions/infiltrates; moderate emphysema - plan to  continue 2L inpatient, IV low dose steroids, nebulizers, expectorants, and cough suppressants - IV Levaquin    2. Fever of 102 in Immunosuppressed Individual - Secondary to probably acute bronchitis - No blood cultures obtained - Will obtain urine culture - IV Levaquin   3. Polymyositis: - Currently on Cellcept - appears stable   4. CLL - diagnosed in 2007 - cared for by Dr. Oliva Bustard, [facility]  - appears stable   5. Asymptomatic Bradycardia with RBB - Not currently on blood pressure medications - Per Dr. Ron Parker, chronic issue   - EKG unchanged   6. Mild Volume Overload? - last 2D ECHO (2012) showed distal hypokinesis with an LVEF of 55% - today, no evidence of fluid overload on CXR - will update 2D ECHO   DVT Prophylaxis Lovenox   AM Labs Ordered, also please review Full Orders  Family Communication:   Daughter at bedside. Understand and agrees to treatment approach and plan    Code Status:  DNR  Admitting to Inpatient   Condition:  Stable  Time spent in minutes : [id number], [place], Jenetta Downer, PA-S, [place], Bobby Rumpf PA-C on 11/01/2013 at 3:35 PM  Between 7am to 7pm - Pager - [phone]  After 7pm go to [email or web address] - password [email or web address]  And look for the night coverage person covering me after hours  [facility] Office  [phone]

## 2013-11-02 DIAGNOSIS — M3320 Polymyositis, organ involvement unspecified: Secondary | ICD-10-CM

## 2013-11-02 DIAGNOSIS — R509 Fever, unspecified: Secondary | ICD-10-CM

## 2013-11-02 DIAGNOSIS — I369 Nonrheumatic tricuspid valve disorder, unspecified: Secondary | ICD-10-CM

## 2013-11-02 LAB — URINALYSIS, ROUTINE W REFLEX MICROSCOPIC
Bilirubin Urine: NEGATIVE
Glucose, UA: NEGATIVE mg/dL
HGB URINE DIPSTICK: NEGATIVE
Ketones, ur: NEGATIVE mg/dL
Nitrite: NEGATIVE
PROTEIN: 30 mg/dL — AB
Specific Gravity, Urine: 1.023 (ref 1.005–1.030)
UROBILINOGEN UA: 1 mg/dL (ref 0.0–1.0)
pH: 7.5 (ref 5.0–8.0)

## 2013-11-02 LAB — BASIC METABOLIC PANEL
Anion gap: 10 (ref 5–15)
BUN: 13 mg/dL (ref 6–23)
CALCIUM: 8.6 mg/dL (ref 8.4–10.5)
CO2: 26 meq/L (ref 19–32)
CREATININE: 0.82 mg/dL (ref 0.50–1.35)
Chloride: 104 mEq/L (ref 96–112)
GFR calc Af Amer: >90 mL/min (ref >90)
GFR calc non Af Amer: 81 mL/min — ABNORMAL LOW (ref >90)
Glucose, Bld: 103 mg/dL — ABNORMAL HIGH (ref 70–99)
Potassium: 4.2 mEq/L (ref 3.7–5.3)
Sodium: 140 mEq/L (ref 137–147)

## 2013-11-02 LAB — CBC
HEMATOCRIT: 36.3 % — AB (ref 39.0–52.0)
Hemoglobin: 11.5 g/dL — ABNORMAL LOW (ref 13.0–17.0)
MCH: 31.9 pg (ref 26.0–34.0)
MCHC: 31.7 g/dL (ref 30.0–36.0)
MCV: 100.6 fL — ABNORMAL HIGH (ref 78.0–100.0)
Platelets: 165 10*3/uL (ref 150–400)
RBC: 3.61 MIL/uL — ABNORMAL LOW (ref 4.22–5.81)
RDW: 16 % — ABNORMAL HIGH (ref 11.5–15.5)
WBC: 10.5 10*3/uL (ref 4.0–10.5)

## 2013-11-02 LAB — URINE MICROSCOPIC-ADD ON

## 2013-11-02 MED ORDER — IPRATROPIUM-ALBUTEROL 0.5-2.5 (3) MG/3ML IN SOLN: 3.0000 mL | RESPIRATORY_TRACT | Status: DC | PRN

## 2013-11-02 MED ORDER — PIPERACILLIN-TAZOBACTAM 3.375 G IVPB
3.3750 g | Freq: Three times a day (TID) | INTRAVENOUS | Status: DC
  Administered 2013-11-02 – 2013-11-03 (×3): 3.375 g via INTRAVENOUS
  Filled 2013-11-02 (×4): qty 50

## 2013-11-02 MED ORDER — SALINE SPRAY 0.65 % NA SOLN
1.0000 | NASAL | Status: DC | PRN
  Filled 2013-11-02: qty 44

## 2013-11-02 MED ORDER — IPRATROPIUM-ALBUTEROL 0.5-2.5 (3) MG/3ML IN SOLN
3.0000 mL | RESPIRATORY_TRACT | Status: DC
  Administered 2013-11-02 – 2013-11-03 (×8): 3 mL via RESPIRATORY_TRACT
  Filled 2013-11-02 (×8): qty 3

## 2013-11-02 MED ORDER — FLUTICASONE PROPIONATE 50 MCG/ACT NA SUSP
2.0000 | Freq: Every day | NASAL | Status: DC
  Administered 2013-11-02 – 2013-11-03 (×2): 2 via NASAL
  Filled 2013-11-02 (×2): qty 16

## 2013-11-02 MED ORDER — VANCOMYCIN HCL 500 MG IV SOLR
500.0000 mg | Freq: Two times a day (BID) | INTRAVENOUS | Status: DC
  Administered 2013-11-03 (×2): 500 mg via INTRAVENOUS
  Filled 2013-11-02 (×6): qty 500

## 2013-11-02 NOTE — Evaluation (Signed)
Physical Therapy Evaluation/ Discharge Patient Details Name: [name] [name] MRN: [id number] DOB: 1930-02-19 Today's Date: 11/02/2013   History of Present Illness  [name] [name]  is a 80 y.o. male, PMH of polymyositis on immunosuppressive therapy, bradycardia, CLL presents with fever of 102, chills, rigor, cough, and hypoxia. Symptoms began Wednesday 7/22. Lives alone, usually very independent and self-sufficient. States once symptoms began, his energy and drive for ADL were significantly decreased  Clinical Impression  Pt very pleasant gentleman with currently no reports of weakness, fatigue or difficulty with mobility. Pt performed gait and stairs without LOB and sats 90-97% on RA. Pt currently without need for therapy with pt aware and in agreement. Recommend daily mobility and continued OOB for all meals. Will sign off.     Follow Up Recommendations No PT follow up    Equipment Recommendations  None recommended by PT    Recommendations for Other Services       Precautions / Restrictions Precautions Precautions: None      Mobility  Bed Mobility Overal bed mobility: Modified Independent                Transfers Overall transfer level: Independent                  Ambulation/Gait Ambulation/Gait assistance: Independent Ambulation Distance (Feet): 450 Feet Assistive device: None Gait Pattern/deviations: WFL(Within Functional Limits)   Gait velocity interpretation: at or above normal speed for age/gender    Stairs Stairs: Yes Stairs assistance: Modified independent (Device/Increase time) Stair Management: One rail Right;Alternating pattern;Forwards Number of Stairs: 11    Wheelchair Mobility    Modified Rankin (Stroke Patients Only)       Balance Overall balance assessment: No apparent balance deficits (not formally assessed)                                           Pertinent Vitals/Pain No pain HR 66 sats 90-96% on RA     Home Living Family/patient expects to be discharged to:: Private residence Living Arrangements: Alone Available Help at Discharge: Family;Available PRN/intermittently Type of Home: House Home Access: Stairs to enter Entrance Stairs-Rails: Left Entrance Stairs-Number of Steps: 2 Home Layout: One level Home Equipment: None      Prior Function Level of Independence: Independent               Hand Dominance        Extremity/Trunk Assessment   Upper Extremity Assessment: Overall WFL for tasks assessed           Lower Extremity Assessment: Overall WFL for tasks assessed      Cervical / Trunk Assessment: Normal  Communication   Communication: No difficulties  Cognition Arousal/Alertness: Awake/alert Behavior During Therapy: WFL for tasks assessed/performed Overall Cognitive Status: Within Functional Limits for tasks assessed                      General Comments      Exercises        Assessment/Plan    PT Assessment Patent does not need any further PT services  PT Diagnosis     PT Problem List    PT Treatment Interventions     PT Goals (Current goals can be found in the Care Plan section) Acute Rehab PT Goals PT Goal Formulation: No goals set, d/c therapy    Frequency  Barriers to discharge        Co-evaluation               End of Session   Activity Tolerance: Patient tolerated treatment well Patient left: in chair;with call bell/phone within reach;with family/visitor present      Functional Assessment Tool Used: clinical judgement Functional Limitation: Mobility: Walking and moving around Mobility: Walking and Moving Around Current Status (C0034): At least 1 percent but less than 20 percent impaired, limited or restricted Mobility: Walking and Moving Around Goal Status [phone]): At least 1 percent but less than 20 percent impaired, limited or restricted Mobility: Walking and Moving Around Discharge Status [phone]): At  least 1 percent but less than 20 percent impaired, limited or restricted    Time: 0955-1007 PT Time Calculation (min): 12 min   Charges:   PT Evaluation $Initial PT Evaluation Tier I: 1 Procedure     PT G Codes:   Functional Assessment Tool Used: clinical judgement Functional Limitation: Mobility: Walking and moving around    Melford Aase 11/02/2013, 10:26 AM Elwyn Reach, [place]

## 2013-11-02 NOTE — Progress Notes (Signed)
[facility] PROGRESS NOTE  [name] [name] DJM:[id number] DOB: Nov 05, 1929 DOA: 11/01/2013 PCP: Otilio Miu, MD  Assessment/Plan: 1. Hypoxia (92%)  - Likely secondary to acute bronchitis +/- slight volume overload  - does not use supplemental oxygen use at home  - 2L received in ED; sats remain ~ 95%  - CXR negative for effusions/infiltrates; moderate emphysema  - Cont O2 as needed as inpatient, IV low dose steroids, nebulizers, expectorants, and cough suppressants  - IV Levaquin initially - consider broadening abx coverage 2. Fever of 102 in Immunosuppressed Individual  - Secondary to probably acute bronchitis  - No blood cultures obtained, pending - Will obtain urine culture, pending - IV Levaquin initially, however pt still febrile - consider broadening coverage with vanc/zosyn until cultures return pos 3. Polymyositis:  - Currently on Cellcept  - appears stable  4. CLL  - diagnosed in 2007  - cared for by Dr. Oliva Bustard, [facility]  - appears stable  5. Asymptomatic Bradycardia with RBB  - Not currently on blood pressure medications  - Per Dr. Ron Parker, chronic issue  - EKG unchanged  6. Mild Volume Overload?  - last 2D ECHO (2012) showed distal hypokinesis with an LVEF of 55%  - today, no evidence of fluid overload on CXR  - will update 2D ECHO  Code Status: DNR Family Communication: Pt in room (indicate person spoken with, relationship, and if by phone, the number) Disposition Plan: Pending   Consultants:    Procedures:    Antibiotics:  Levaquin 7/24>>>7/25  Vanc 7/25>>>  Zosyn 7/25>>>   HPI/Subjective: Reports feeling "sick" today. Subjective fevers.   Objective: Filed Vitals:   11/02/13 0724 11/02/13 1024 11/02/13 1331 11/02/13 1438  BP:    128/50  Pulse:  66  86  Temp:   98.5 F (36.9 C) 99.5 F (37.5 C)  TempSrc:    Oral  Resp:    22  Height:      Weight:      SpO2: 95% 96%  90%    Intake/Output Summary (Last 24 hours) at 11/02/13  1735 Last data filed at 11/02/13 0950  Gross per 24 hour  Intake    240 ml  Output      0 ml  Net    240 ml   Filed Weights   11/01/13 1145  Weight: 56.7 kg (125 lb)    Exam:   General:  Awake, in nad  Cardiovascular: regular, s1, s2  Respiratory: normal resp effort, no wheezing  Abdomen: soft, nondistended  Musculoskeletal: perfused, no clubbing   Data Reviewed: Basic Metabolic Panel:  Recent Labs Lab 11/01/13 1150 11/02/13 0449  NA 138 140  K 4.9 4.2  CL 101 104  CO2 28 26  GLUCOSE 85 103*  BUN 14 13  CREATININE 0.87 0.82  CALCIUM 9.2 8.6   Liver Function Tests:  Recent Labs Lab 11/01/13 1150  AST 26  ALT 13  ALKPHOS 76  BILITOT 1.0  PROT 6.6  ALBUMIN 3.6   No results found for this basename: LIPASE, AMYLASE,  in the last 168 hours No results found for this basename: AMMONIA,  in the last 168 hours CBC:  Recent Labs Lab 11/01/13 1150 11/02/13 0449  WBC 12.1* 10.5  HGB 13.4 11.5*  HCT 42.8 36.3*  MCV 102.1* 100.6*  PLT 157 165   Cardiac Enzymes:  Recent Labs Lab 11/01/13 1150  TROPONINI <0.30   BNP (last 3 results)  Recent Labs  11/01/13 1150  PROBNP 283.7  CBG: No results found for this basename: GLUCAP,  in the last 168 hours  No results found for this or any previous visit (from the past 240 hour(s)).   Studies: Dg Chest 2 View  11/01/2013   CLINICAL DATA:  Two day history of cough, fever and shortness of breath.  EXAM: CHEST  2 VIEW  COMPARISON:  Two-view chest x-ray 07/16/2008. One view chest x-ray 11/17/2007.  FINDINGS: Cardiac silhouette mildly enlarged but stable. Thoracic aorta tortuous and atherosclerotic, unchanged. Prominent central pulmonary arteries, unchanged. Hyperinflation and emphysematous changes in both lungs, unchanged. Linear scarring in the left lower lobe, unchanged. Lungs otherwise clear. No localized airspace consolidation. No pleural effusions. No pneumothorax. Normal pulmonary vascularity. Multiple  thoracic and upper lumbar spine fractures which have undergone prior augmentation, accounting for exaggeration of the usual thoracic kyphosis. Severe generalized osseous demineralization.  IMPRESSION: 1. COPD/emphysema. Stable left lower lobe scarring. No acute cardiopulmonary disease. 2. Stable mild cardiomegaly without pulmonary edema.   Electronically Signed   By: Evangeline Dakin M.D.   On: 11/01/2013 12:18    Scheduled Meds: . aspirin EC  81 mg Oral Daily  . calcium-vitamin D  1 tablet Oral BID  . cholecalciferol  1,000 Units Oral Daily  . citalopram  40 mg Oral Daily  . [START ON 11/09/2013] cyanocobalamin  1,000 mcg Intramuscular Q30 days  . enoxaparin (LOVENOX) injection  40 mg Subcutaneous Q24H  . ferrous sulfate  325 mg Oral Q breakfast  . furosemide  40 mg Oral Daily  . guaiFENesin  1,200 mg Oral BID  . ipratropium-albuterol  3 mL Nebulization Q6H  . ipratropium-albuterol  3 mL Nebulization Q4H  . levofloxacin (LEVAQUIN) IV  500 mg Intravenous Q24H  . methylPREDNISolone (SOLU-MEDROL) injection  40 mg Intravenous Daily  . multivitamin with minerals  1 tablet Oral Daily  . mycophenolate  500 mg Oral BID  . omega-3 acid ethyl esters  1 g Oral Daily  . potassium chloride SA  20 mEq Oral Daily  . sodium chloride  3 mL Intravenous Q12H  . sodium chloride  3 mL Intravenous Q12H  . temazepam  30 mg Oral QHS  . vitamin B-12  1,000 mcg Oral Daily   Continuous Infusions:   Principal Problem:   Hypoxia Active Problems:   CLL   DYSPNEA   Diastolic dysfunction   Bradycardia   Polymyositis   Acute bronchitis  Time spent: 39min  Ryden Wainer, [facility] Pager [phone]. If 7PM-7AM, please contact night-coverage at [email or web address], password [facility] 11/02/2013, 5:35 PM  LOS: 1 day

## 2013-11-02 NOTE — Progress Notes (Signed)
Echocardiogram 2D Echocardiogram has been performed.  [name] [name] M 11/02/2013, 11:47 AM

## 2013-11-02 NOTE — Progress Notes (Signed)
ANTIBIOTIC CONSULT NOTE - INITIAL  Pharmacy Consult for vancomycin and zosyn Indication: fever, immunosuppressed   Allergies  Allergen Reactions  . Sulfonamide Derivatives     Patient Measurements: Height: 5\' 5"  (165.1 cm) Weight: 125 lb (56.7 kg) IBW/kg (Calculated) : 61.5   Vital Signs: Temp: 99.5 F (37.5 C) (07/25 1438) Temp src: Oral (07/25 1438) BP: 128/50 mmHg (07/25 1438) Pulse Rate: 86 (07/25 1438) Intake/Output from previous day:   Intake/Output from this shift: Total I/O In: 240 [P.O.:240] Out: -   Labs:  Recent Labs  11/01/13 1150 11/02/13 0449  WBC 12.1* 10.5  HGB 13.4 11.5*  PLT 157 165  CREATININE 0.87 0.82   Estimated Creatinine Clearance: 56.7 ml/min (by C-G formula based on Cr of 0.82). No results found for this basename: VANCOTROUGH, VANCOPEAK, VANCORANDOM, GENTTROUGH, GENTPEAK, GENTRANDOM, TOBRATROUGH, TOBRAPEAK, TOBRARND, AMIKACINPEAK, AMIKACINTROU, AMIKACIN,  in the last 72 hours   Microbiology: No results found for this or any previous visit (from the past 720 hour(s)).  Medical History: Past Medical History  Diagnosis Date  . CLL (chronic lymphoblastic leukemia) 2006    Rituxan treatment 2006  . Atrial septal aneurysm 2009    Insignificant, no, January, 2009  . Infection of PEG site     cellulitis.Marland KitchenResolved  . Vertebral compression fracture     multiple levels  . Herpes zoster     right chest  . Aortic insufficiency 2009    mild, Mild, echo, 2012  . Diastolic dysfunction     mild  . Chest pain     EF 55%, echo, October, 2009, possible inferior and posterior borderline hypokinesis, patient has never  had cardiac catheterization  . Edema     EF 55%, echo, October, 2009, possible borderline inferior and posterior hypokinesis... heart catheterization has never been done(July 10, 2009)  . Myositis     Caused pulmonary insufficiency with muscle weakness in the past / Dr.Willis-neurology, Imuran and prednisone  . Calculus of  kidney   . Inguinal hernia without mention of obstruction or gangrene, unilateral or unspecified, (not specified as recurrent)     right   . Unspecified gastritis and gastroduodenitis without mention of hemorrhage   . Abdominal pain, epigastric   . Nonspecific elevation of levels of transaminase or lactic acid dehydrogenase (LDH)   . Feeding difficulties and mismanagement   . Edema of male genital organs     Resolved, occurred during illness related to myositis  . Swelling of arm     left.Marland KitchenMarland KitchenResolved.... no DVT  . LFT elevation     Related to Imuran in the past... dose was adjusted  . Dyslipidemia     statins not used due to LFT abnormalities  . Shortness of breath     Breathing abnormalities related to muscle weakness from myositis  . RBBB (right bundle branch block)     Old  . Bradycardia     Sinus bradycardia, old  . Ejection fraction     EF 60%, echo, October, 2012  . Mitral regurgitation     Mild / moderate, echo, 2012  . Right ventricular dysfunction     Mild, echo, 2012  . Febrile illness     May, 2013, question sinusitis  . Polymyositis 11/26/2012    Medications:  See med rec  Assessment: Patient's an 80 y.o M with hx CLL and polymyositis on cellcept PTA.  To start broad antibiotic fever.  Goal of Therapy:  Vancomycin trough level 15-20 mcg/ml  Plan:  1) zosyn 3.375gm IV  q8h (infuse over 4 hours) 2) vancomycin 500mg  IV q12h  Adely Facer P 11/02/2013,6:07 PM

## 2013-11-03 ENCOUNTER — Inpatient Hospital Stay (HOSPITAL_COMMUNITY): Payer: Medicare Other

## 2013-11-03 LAB — CBC WITH DIFFERENTIAL/PLATELET
BASOS ABS: 0 10*3/uL (ref 0.0–0.1)
BASOS PCT: 0 % (ref 0–1)
EOS ABS: 0 10*3/uL (ref 0.0–0.7)
Eosinophils Relative: 0 % (ref 0–5)
HEMATOCRIT: 38.4 % — AB (ref 39.0–52.0)
Hemoglobin: 12.2 g/dL — ABNORMAL LOW (ref 13.0–17.0)
Lymphocytes Relative: 53 % — ABNORMAL HIGH (ref 12–46)
Lymphs Abs: 3.3 10*3/uL (ref 0.7–4.0)
MCH: 31.9 pg (ref 26.0–34.0)
MCHC: 31.8 g/dL (ref 30.0–36.0)
MCV: 100.3 fL — ABNORMAL HIGH (ref 78.0–100.0)
MONO ABS: 0.4 10*3/uL (ref 0.1–1.0)
Monocytes Relative: 6 % (ref 3–12)
NEUTROS PCT: 41 % — AB (ref 43–77)
Neutro Abs: 2.6 10*3/uL (ref 1.7–7.7)
Platelets: 177 10*3/uL (ref 150–400)
RBC: 3.83 MIL/uL — ABNORMAL LOW (ref 4.22–5.81)
RDW: 16.1 % — AB (ref 11.5–15.5)
WBC: 6.3 10*3/uL (ref 4.0–10.5)

## 2013-11-03 LAB — URINE CULTURE

## 2013-11-03 MED ORDER — LEVOFLOXACIN 750 MG PO TABS
750.0000 mg | ORAL_TABLET | Freq: Every day | ORAL | Status: DC
  Administered 2013-11-03: 750 mg via ORAL
  Filled 2013-11-03 (×2): qty 1

## 2013-11-03 NOTE — Progress Notes (Signed)
Pharmacy: Levaquin  83yom started on vancomycin and zosyn yesterday for fever in setting of immunosuppression to be switched to oral levaquin today. Renal function stable.  7/25 vanc>>7/26 7/25 zosyn>>7/26 7/26 PO levaquin>>  7/25 Blood Culture: ngtd 7/25 Urine Culture: ngtd Plan: 1) Levaquin 750mg  PO q24  Nena Jordan, PharmD, BCPS 11/03/2013, 2:10 PM

## 2013-11-03 NOTE — Progress Notes (Signed)
[facility] PROGRESS NOTE  [name] [name] QAS:[id number] DOB: December 08, 1929 DOA: 11/01/2013 PCP: Otilio Miu, MD  Assessment/Plan: 1. Hypoxia (92%)  - Likely secondary to acute bronchitis +/- slight volume overload  - does not use supplemental oxygen use at home, now on min o2 support - CXR negative for effusions/infiltrates; moderate emphysema  - Cont O2 as needed as inpatient, IV low dose steroids, nebulizers, expectorants, and cough suppressants  - IV Levaquin initially - changed to vanc and zysyn as pt became febrile - Pt afebrile overnight, so resumed PO Levaquin today 2. Fever of 102 in Immunosuppressed Individual  - Secondary to probably acute bronchitis  -Blood cultures obtained, pending - Urine culture, pending - Now on PO levaquin - monitor for leukocytosis / fevers 3. Polymyositis:  - Currently on Cellcept  - appears stable  4. CLL  - diagnosed in 2007  - cared for by Dr. Oliva Bustard, [facility]  - appears stable  5. Asymptomatic Bradycardia with RBB  - Not currently on blood pressure medications  - Per Dr. Ron Parker, chronic issue  - EKG unchanged  6. Mild Volume Overload?  - last 2D ECHO (2012) showed distal hypokinesis with an LVEF of 55%  - today, no evidence of fluid overload on CXR  - will update 2D ECHO  Code Status: DNR Family Communication: Pt in room, son at bedside Disposition Plan: Pending   Consultants:    Procedures:    Antibiotics:  Levaquin 7/24>>>7/25>>>7/26 (oral resumed)>>>>  Vanc 7/25>>>7/26  Zosyn 7/25>>> 7/26  HPI/Subjective: Feels much better today.   Objective: Filed Vitals:   11/03/13 0344 11/03/13 0610 11/03/13 0918 11/03/13 1420  BP:  131/56  136/76  Pulse:  77  90  Temp:  97.6 F (36.4 C)  97.5 F (36.4 C)  TempSrc:  Oral  Oral  Resp:  18  18  Height:      Weight:      SpO2: 96% 91% 94% 93%    Intake/Output Summary (Last 24 hours) at 11/03/13 1436 Last data filed at 11/03/13 1400  Gross per 24 hour   Intake    660 ml  Output      0 ml  Net    660 ml   Filed Weights   11/01/13 1145  Weight: 56.7 kg (125 lb)    Exam:   General:  Awake, in nad  Cardiovascular: regular, s1, s2  Respiratory: normal resp effort, no wheezing  Abdomen: soft, nondistended  Musculoskeletal: perfused, no clubbing   Data Reviewed: Basic Metabolic Panel:  Recent Labs Lab 11/01/13 1150 11/02/13 0449  NA 138 140  K 4.9 4.2  CL 101 104  CO2 28 26  GLUCOSE 85 103*  BUN 14 13  CREATININE 0.87 0.82  CALCIUM 9.2 8.6   Liver Function Tests:  Recent Labs Lab 11/01/13 1150  AST 26  ALT 13  ALKPHOS 76  BILITOT 1.0  PROT 6.6  ALBUMIN 3.6   No results found for this basename: LIPASE, AMYLASE,  in the last 168 hours No results found for this basename: AMMONIA,  in the last 168 hours CBC:  Recent Labs Lab 11/01/13 1150 11/02/13 0449 11/03/13 0402  WBC 12.1* 10.5 6.3  NEUTROABS  --   --  2.6  HGB 13.4 11.5* 12.2*  HCT 42.8 36.3* 38.4*  MCV 102.1* 100.6* 100.3*  PLT 157 165 177   Cardiac Enzymes:  Recent Labs Lab 11/01/13 1150  TROPONINI <0.30   BNP (last 3 results)  Recent Labs  11/01/13 1150  PROBNP 283.7   CBG: No results found for this basename: GLUCAP,  in the last 168 hours  Recent Results (from the past 240 hour(s))  CULTURE, BLOOD (ROUTINE X 2)     Status: None   Collection Time    11/02/13  4:30 PM      Result Value Ref Range Status   Specimen Description BLOOD LEFT ARM   Final   Special Requests BOTTLES DRAWN AEROBIC AND ANAEROBIC  10 CC   Final   Culture  Setup Time     Final   Value: 11/02/2013 19:57     Performed at [facility]   Culture     Final   Value:        BLOOD CULTURE RECEIVED NO GROWTH TO DATE CULTURE WILL BE HELD FOR 5 DAYS BEFORE ISSUING A FINAL NEGATIVE REPORT     Performed at [facility]   Report Status PENDING   Incomplete  CULTURE, BLOOD (ROUTINE X 2)     Status: None   Collection Time    11/02/13  4:39 PM       Result Value Ref Range Status   Specimen Description BLOOD LEFT HAND   Final   Special Requests BOTTLES DRAWN AEROBIC AND ANAEROBIC 10 CC   Final   Culture  Setup Time     Final   Value: 11/02/2013 19:57     Performed at [facility]   Culture     Final   Value:        BLOOD CULTURE RECEIVED NO GROWTH TO DATE CULTURE WILL BE HELD FOR 5 DAYS BEFORE ISSUING A FINAL NEGATIVE REPORT     Performed at [facility]   Report Status PENDING   Incomplete     Studies: Dg Chest Port 1 View  11/03/2013   CLINICAL DATA:  Fever  EXAM: PORTABLE CHEST - 1 VIEW  COMPARISON:  November 01, 2013.  FINDINGS: There is underlying emphysematous change. There is mild right base atelectasis. There is no frank edema or consolidation. Heart is upper normal in size with pulmonary vascularity within normal limits. No adenopathy. Patient is undergone multiple kyphoplasty procedures. There is atherosclerotic change throughout the aorta.  IMPRESSION: Mild right base atelectasis. Underlying emphysema. No consolidation.   Electronically Signed   By: Lowella Grip M.D.   On: 11/03/2013 08:28    Scheduled Meds: . aspirin EC  81 mg Oral Daily  . calcium-vitamin D  1 tablet Oral BID  . cholecalciferol  1,000 Units Oral Daily  . citalopram  40 mg Oral Daily  . [START ON 11/09/2013] cyanocobalamin  1,000 mcg Intramuscular Q30 days  . enoxaparin (LOVENOX) injection  40 mg Subcutaneous Q24H  . ferrous sulfate  325 mg Oral Q breakfast  . fluticasone  2 spray Each Nare Daily  . furosemide  40 mg Oral Daily  . guaiFENesin  1,200 mg Oral BID  . ipratropium-albuterol  3 mL Nebulization Q6H  . ipratropium-albuterol  3 mL Nebulization Q4H  . levofloxacin  750 mg Oral QHS  . methylPREDNISolone (SOLU-MEDROL) injection  40 mg Intravenous Daily  . multivitamin with minerals  1 tablet Oral Daily  . mycophenolate  500 mg Oral BID  . omega-3 acid ethyl esters  1 g Oral Daily  . potassium chloride SA  20 mEq Oral Daily  .  sodium chloride  3 mL Intravenous Q12H  . sodium chloride  3 mL Intravenous Q12H  . temazepam  30 mg Oral  QHS  . vitamin B-12  1,000 mcg Oral Daily   Continuous Infusions:   Principal Problem:   Hypoxia Active Problems:   CLL   DYSPNEA   Diastolic dysfunction   Bradycardia   Polymyositis   Acute bronchitis  Time spent: 27min  Niamh Rada, [place] [facility] Pager [phone]. If 7PM-7AM, please contact night-coverage at [email or web address], password [facility] 11/03/2013, 2:36 PM  LOS: 2 days

## 2013-11-04 MED ORDER — GUAIFENESIN ER 600 MG PO TB12: 1200.0000 mg | ORAL_TABLET | Freq: Two times a day (BID) | ORAL | Status: DC

## 2013-11-04 MED ORDER — FLUTICASONE PROPIONATE 50 MCG/ACT NA SUSP: 2.0000 | Freq: Every day | NASAL | Status: DC

## 2013-11-04 MED ORDER — LEVOFLOXACIN 750 MG PO TABS: 750.0000 mg | ORAL_TABLET | Freq: Every day | ORAL | Status: DC

## 2013-11-04 MED ORDER — DEXTROMETHORPHAN POLISTIREX 30 MG/5ML PO LQCR: 30.0000 mg | Freq: Two times a day (BID) | ORAL | Status: DC | PRN

## 2013-11-04 NOTE — Discharge Summary (Signed)
Pt seen and examined. Agree with above discharge summary. Briefly, pt with presenting febrile illness. Pan cultures neg. Pt now afebrile and tolerating levquin. Stable for discharge with close follow up.

## 2013-11-04 NOTE — Discharge Instructions (Signed)
Please complete your course of Levaquin.  See your primary care physician for a hospital follow up in the next 7 - 10 days.

## 2013-11-04 NOTE — Discharge Summary (Signed)
PATIENT DETAILS Name: [name] [name] Age: 80 y.o. Sex: male Date of Birth: 1929/06/01 MRN: [id number]. Admit Date: 11/01/2013 Admitting Physician: Donne Hazel, MD CVE:LFYBO,FBPZWC, MD  Recommendations for Outpatient Follow-up:  1. Discharge to home 2. Continue PO Levaquin for acute bronchitis/sinusitus 3. Follow-up with Dr. Ronnald Ramp in one week 4. Follow-up with Dr. Ron Parker in regards to Right Ventricle changes found on recent 2 D ECHO    PRIMARY DISCHARGE DIAGNOSIS:  Principal Problem:   Hypoxia Active Problems:   CLL   DYSPNEA   Diastolic dysfunction   Bradycardia   Polymyositis   Acute bronchitis      PAST MEDICAL HISTORY: Past Medical History  Diagnosis Date  . CLL (chronic lymphoblastic leukemia) 2006    Rituxan treatment 2006  . Atrial septal aneurysm 2009    Insignificant, no, January, 2009  . Infection of PEG site     cellulitis.Marland KitchenResolved  . Vertebral compression fracture     multiple levels  . Herpes zoster     right chest  . Aortic insufficiency 2009    mild, Mild, echo, 2012  . Diastolic dysfunction     mild  . Chest pain     EF 55%, echo, October, 2009, possible inferior and posterior borderline hypokinesis, patient has never  had cardiac catheterization  . Edema     EF 55%, echo, October, 2009, possible borderline inferior and posterior hypokinesis... heart catheterization has never been done(July 10, 2009)  . Myositis     Caused pulmonary insufficiency with muscle weakness in the past / Dr.Willis-neurology, Imuran and prednisone  . Calculus of kidney   . Inguinal hernia without mention of obstruction or gangrene, unilateral or unspecified, (not specified as recurrent)     right   . Unspecified gastritis and gastroduodenitis without mention of hemorrhage   . Abdominal pain, epigastric   . Nonspecific elevation of levels of transaminase or lactic acid dehydrogenase (LDH)   . Feeding difficulties and mismanagement   . Edema of male genital organs      Resolved, occurred during illness related to myositis  . Swelling of arm     left.Marland KitchenMarland KitchenResolved.... no DVT  . LFT elevation     Related to Imuran in the past... dose was adjusted  . Dyslipidemia     statins not used due to LFT abnormalities  . Shortness of breath     Breathing abnormalities related to muscle weakness from myositis  . RBBB (right bundle branch block)     Old  . Bradycardia     Sinus bradycardia, old  . Ejection fraction     EF 60%, echo, October, 2012  . Mitral regurgitation     Mild / moderate, echo, 2012  . Right ventricular dysfunction     Mild, echo, 2012  . Febrile illness     May, 2013, question sinusitis  . Polymyositis 11/26/2012    DISCHARGE MEDICATIONS:   Medication List         aspirin 81 MG tablet  Take 81 mg by mouth daily.     CALCIUM 500+D 500-200 MG-UNIT per tablet  Generic drug:  calcium-vitamin D  Take 1 tablet by mouth 2 (two) times daily.     cholecalciferol 1000 UNITS tablet  Commonly known as:  VITAMIN D  Take 1,000 Units by mouth daily.     citalopram 40 MG tablet  Commonly known as:  CELEXA  Take 40 mg by mouth daily.     dextromethorphan 30 MG/5ML liquid  Commonly known  as:  DELSYM  Take 5 mLs (30 mg total) by mouth 2 (two) times daily as needed for cough.     ferrous sulfate 325 (65 FE) MG tablet  Take 325 mg by mouth daily with breakfast.     fish oil-omega-3 fatty acids 1000 MG capsule  Take 1 g by mouth daily.     fluticasone 50 MCG/ACT nasal spray  Commonly known as:  FLONASE  Place 2 sprays into both nostrils daily.     furosemide 40 MG tablet  Commonly known as:  LASIX  Take 40 mg by mouth daily.     guaiFENesin 600 MG 12 hr tablet  Commonly known as:  MUCINEX  Take 2 tablets (1,200 mg total) by mouth 2 (two) times daily.     HYDROcodone-acetaminophen 5-325 MG per tablet  Commonly known as:  NORCO/VICODIN  Take 1 tablet by mouth every 6 (six) hours as needed for pain.     levofloxacin 750 MG tablet   Commonly known as:  LEVAQUIN  Take 1 tablet (750 mg total) by mouth at bedtime.     levofloxacin 750 MG tablet  Commonly known as:  LEVAQUIN  Take 1 tablet (750 mg total) by mouth daily.     multivitamin capsule  Take 1 capsule by mouth daily.     mycophenolate 500 MG tablet  Commonly known as:  CELLCEPT  Take 1 tablet (500 mg total) by mouth 2 (two) times daily.     potassium chloride SA 20 MEQ tablet  Commonly known as:  KLOR-CON M20  Take 1 tablet (20 mEq total) by mouth daily. Take one tablet by mouth once daily. Take an extra tablet twice a week.     RESOURCE BENEPROTEIN Pack  Take 3g three times a day     temazepam 30 MG capsule  Commonly known as:  RESTORIL  Take 30 mg by mouth at bedtime.     cyanocobalamin 1000 MCG/ML injection  Commonly known as:  (VITAMIN B-12)  Inject 1,000 mcg into the muscle every 30 (thirty) days. At the first of the month     vitamin B-12 1000 MCG tablet  Commonly known as:  CYANOCOBALAMIN  Take 1,000 mcg by mouth daily.        ALLERGIES:   Allergies  Allergen Reactions  . Sulfonamide Derivatives     BRIEF HPI:  See H&P, Labs, Consult and Test reports for all detail. Patient with PMH of polymyositis on immunosuppressive therapy, bradycardia, and CLL presented with fever of 102, chills, rigor, cough, and hypoxia. Symptoms began Wednesday 7/22. Lives alone, usually very independent and self-sufficient. States once symptoms began, his energy and drive for ADL were significantly decreased. Stated he had never experienced these symptoms before and no one with close contact to him had similar symptoms. Had been felt weak overall, had been coughing up yellow colored sputum and noticed some shortness of breath, but denied lightheadedness, nausea, vomiting, diarrhea, chest pain, or abdominal pain. Denied home oxygen use and home inhaler/nebulizers. Positive smoking history but quit approximately around 1970. Denied alcohol use.     CONSULTATIONS:   None  PERTINENT RADIOLOGIC STUDIES: Dg Chest 2 View  11/01/2013   CLINICAL DATA:  Two day history of cough, fever and shortness of breath.  EXAM: CHEST  2 VIEW  COMPARISON:  Two-view chest x-ray 07/16/2008. One view chest x-ray 11/17/2007.  FINDINGS: Cardiac silhouette mildly enlarged but stable. Thoracic aorta tortuous and atherosclerotic, unchanged. Prominent central pulmonary arteries, unchanged. Hyperinflation and emphysematous  changes in both lungs, unchanged. Linear scarring in the left lower lobe, unchanged. Lungs otherwise clear. No localized airspace consolidation. No pleural effusions. No pneumothorax. Normal pulmonary vascularity. Multiple thoracic and upper lumbar spine fractures which have undergone prior augmentation, accounting for exaggeration of the usual thoracic kyphosis. Severe generalized osseous demineralization.  IMPRESSION: 1. COPD/emphysema. Stable left lower lobe scarring. No acute cardiopulmonary disease. 2. Stable mild cardiomegaly without pulmonary edema.   Electronically Signed   By: Evangeline Dakin M.D.   On: 11/01/2013 12:18   Dg Chest Port 1 View  11/03/2013   CLINICAL DATA:  Fever  EXAM: PORTABLE CHEST - 1 VIEW  COMPARISON:  November 01, 2013.  FINDINGS: There is underlying emphysematous change. There is mild right base atelectasis. There is no frank edema or consolidation. Heart is upper normal in size with pulmonary vascularity within normal limits. No adenopathy. Patient is undergone multiple kyphoplasty procedures. There is atherosclerotic change throughout the aorta.  IMPRESSION: Mild right base atelectasis. Underlying emphysema. No consolidation.   Electronically Signed   By: Lowella Grip M.D.   On: 11/03/2013 08:28     PERTINENT LAB RESULTS: CBC:  Recent Labs  11/02/13 0449 11/03/13 0402  WBC 10.5 6.3  HGB 11.5* 12.2*  HCT 36.3* 38.4*  PLT 165 177   CMET CMP     Component Value Date/Time   NA 140 11/02/2013 0449   NA 138  11/26/2012 0000   K 4.2 11/02/2013 0449   CL 104 11/02/2013 0449   CO2 26 11/02/2013 0449   GLUCOSE 103* 11/02/2013 0449   GLUCOSE 83 11/26/2012 0000   BUN 13 11/02/2013 0449   BUN 18 11/26/2012 0000   CREATININE 0.82 11/02/2013 0449   CALCIUM 8.6 11/02/2013 0449   PROT 6.6 11/01/2013 1150   PROT 5.8* 11/26/2012 0000   ALBUMIN 3.6 11/01/2013 1150   AST 26 11/01/2013 1150   ALT 13 11/01/2013 1150   ALKPHOS 76 11/01/2013 1150   BILITOT 1.0 11/01/2013 1150   GFRNONAA 81* 11/02/2013 0449   GFRAA >90 11/02/2013 0449    GFR Estimated Creatinine Clearance: 56.7 ml/min (by C-G formula based on Cr of 0.82). No results found for this basename: CKTOTAL, CKMB, CKMBINDEX, TROPONINI,  in the last 72 hours Microbiology: Recent Results (from the past 240 hour(s))  URINE CULTURE     Status: None   Collection Time    11/02/13  4:11 PM      Result Value Ref Range Status   Specimen Description URINE, CLEAN CATCH   Final   Special Requests NONE   Final   Culture  Setup Time     Final   Value: 11/02/2013 21:13     Performed at [facility]     Final   Value: 1,000 COLONIES/ML     Performed at [facility]   Culture     Final   Value: INSIGNIFICANT GROWTH     Performed at [facility]   Report Status 11/03/2013 FINAL   Final  CULTURE, BLOOD (ROUTINE X 2)     Status: None   Collection Time    11/02/13  4:30 PM      Result Value Ref Range Status   Specimen Description BLOOD LEFT ARM   Final   Special Requests BOTTLES DRAWN AEROBIC AND ANAEROBIC  10 CC   Final   Culture  Setup Time     Final   Value: 11/02/2013 19:57     Performed  at [facility]     Final   Value:        BLOOD CULTURE RECEIVED NO GROWTH TO DATE CULTURE WILL BE HELD FOR 5 DAYS BEFORE ISSUING A FINAL NEGATIVE REPORT     Performed at [facility]   Report Status PENDING   Incomplete  CULTURE, BLOOD (ROUTINE X 2)     Status: None   Collection Time    11/02/13  4:39 PM       Result Value Ref Range Status   Specimen Description BLOOD LEFT HAND   Final   Special Requests BOTTLES DRAWN AEROBIC AND ANAEROBIC 10 CC   Final   Culture  Setup Time     Final   Value: 11/02/2013 19:57     Performed at [facility]   Culture     Final   Value:        BLOOD CULTURE RECEIVED NO GROWTH TO DATE CULTURE WILL BE HELD FOR 5 DAYS BEFORE ISSUING A FINAL NEGATIVE REPORT     Performed at [facility]   Report Status PENDING   Incomplete     BRIEF HOSPITAL COURSE:   Primary Problem:   1. Hypoxia  - Likely secondary to acute bronchitis and sinusitis - does not use supplemental oxygen use at home, min o2 support during hospital stay - CXR negative for effusions/infiltrates; moderate emphysema  - IV Levaquin initially - changed to vanc and zysyn because pt became febrile during weekend -  resumed PO Levaquin evening of 7/26 for 7 additional days. - received IV low dose steroids, nebulizers, expectorants, and cough suppressants for supportive tx as inpatient.  Active Problems:   2. Fever of 102 in Immunosuppressed Individual  - Secondary probably to acute bronchitis  - No growth on blood cultures obtained - No growth on Urine culture - PO levaquin  - Afebrile on discharge  3. Polymyositis:  - Currently on Cellcept  - stable during hospital course   4. Right Ventricular Dilation via 2D ECHO - last 2D ECHO (2012) showed distal hypokinesis with an LVEF of 55%  - ECHO on 11/02/13 during inpatient stay showed new changes of RV dilation with Tricuspid Regurgitation - follow-up with Dr. Ron Parker (Cardiology)   4. CLL  - diagnosed in 2007  - cared for by Dr. Oliva Bustard, [facility]  - stable   5. Asymptomatic Bradycardia with RBB  - Not currently on blood pressure medications  - Per Dr. Ron Parker, chronic issue  - EKG unchanged    TODAY-DAY OF DISCHARGE:  Subjective:   [name] [name] today has no headache, no chest or abdominal pain, no SOB, no new weakness  tingling or numbness, feels much better wants to go home today.  Objective:   Blood pressure 133/69, pulse 66, temperature 97.8 F (36.6 C), temperature source Oral, resp. rate 20, height 5\' 5"  (1.651 m), weight 56.7 kg (125 lb), SpO2 94.00%.  Intake/Output Summary (Last 24 hours) at 11/04/13 1211 Last data filed at 11/04/13 0028  Gross per 24 hour  Intake    683 ml  Output      0 ml  Net    683 ml   Filed Weights   11/01/13 1145  Weight: 56.7 kg (125 lb)    Exam Awake Alert, Oriented *3, No new F.N deficits, Normal affect.  Sitting in chair reading the paper.  Conversant. .AT,PERRAL Supple Neck,No JVD, No cervical lymphadenopathy appreciated.  Symmetrical Chest wall movement, Good air  movement bilaterally, CTAB  RRR,No Gallops,Rubs or new Murmurs, No Parasternal Heave + B.Sounds, Abd Soft, Non tender, No organomegaly appreciated, No rebound -guarding or rigidity. No Cyanosis, Clubbing or edema, No new Rash or bruise  DISCHARGE CONDITION: Stable  DISPOSITION: Home   DISCHARGE INSTRUCTIONS:    Activity:  As tolerated   Diet recommendation: Heart Healthy diet  Discharge Instructions   Diet - low sodium heart healthy    Complete by:  As directed      Increase activity slowly    Complete by:  As directed            Follow-up Information   Follow up with [facility], MD In 1 week.   Specialty:  Family Medicine   Contact information:   [place] [address] [phone]       Follow up with Dola Argyle, MD In 1 week. (Right Ventricle changes on recent 2D ECHO )    Specialty:  Cardiology   Contact information:   [address] [place] [address] [phone]        Total Time spent on discharge equals 45 minutes.  Signed: Quitman Livings, [place]  11/04/2013 10:53 AM  **Disclaimer: This note may have been dictated with voice recognition software. Similar sounding words can  inadvertently be transcribed and this note may contain transcription errors which may not have been corrected upon publication of note.**

## 2013-11-04 NOTE — Progress Notes (Signed)
CARE MANAGEMENT NOTE 11/04/2013  Patient:  [name] [name], [name] [name]   Account Number:  [id number]  Date Initiated:  11/04/2013  Documentation initiated by:  [facility]  Subjective/Objective Assessment:   admitted with hypoxia     Action/Plan:   PT eval- no HHC or DME needs identified   Anticipated DC Date:  11/04/2013   Anticipated DC Plan:  [place]  CM consult      Choice offered to / List presented to:             Status of service:  Completed, signed off Medicare Important Message given?  YES (If response is "NO", the following Medicare IM given date fields will be blank) Date Medicare IM given:  11/04/2013 Medicare IM given by:  [facility] Date Additional Medicare IM given:   Additional Medicare IM given by:    Discharge Disposition:  HOME/SELF CARE  Per UR Regulation:    If discussed at Long Length of Stay Meetings, dates discussed:    Comments:  11/04/13 Spoke with patient, no HHC or DME needs identified. Gave Medicare IM. Fuller Plan RN, BSn, CCM

## 2013-11-08 LAB — CULTURE, BLOOD (ROUTINE X 2)
CULTURE: NO GROWTH
Culture: NO GROWTH

## 2013-11-13 ENCOUNTER — Encounter: Payer: Self-pay | Admitting: Physician Assistant

## 2013-11-13 ENCOUNTER — Ambulatory Visit (INDEPENDENT_AMBULATORY_CARE_PROVIDER_SITE_OTHER)
Admission: RE | Admit: 2013-11-13 | Discharge: 2013-11-13 | Disposition: A | Discharge status: Home / Self Care | Payer: Medicare Other | Source: Ambulatory Visit | Attending: Physician Assistant | Admitting: Physician Assistant

## 2013-11-13 ENCOUNTER — Ambulatory Visit (INDEPENDENT_AMBULATORY_CARE_PROVIDER_SITE_OTHER): Payer: Medicare Other | Admitting: Physician Assistant

## 2013-11-13 VITALS — BP 109/60 | HR 64 | Ht 65.0 in | Wt 120.0 lb

## 2013-11-13 DIAGNOSIS — R0600 Dyspnea, unspecified: Secondary | ICD-10-CM

## 2013-11-13 DIAGNOSIS — I519 Heart disease, unspecified: Secondary | ICD-10-CM

## 2013-11-13 DIAGNOSIS — R0609 Other forms of dyspnea: Secondary | ICD-10-CM

## 2013-11-13 DIAGNOSIS — I351 Nonrheumatic aortic (valve) insufficiency: Secondary | ICD-10-CM

## 2013-11-13 DIAGNOSIS — R0989 Other specified symptoms and signs involving the circulatory and respiratory systems: Secondary | ICD-10-CM

## 2013-11-13 DIAGNOSIS — I5189 Other ill-defined heart diseases: Secondary | ICD-10-CM

## 2013-11-13 DIAGNOSIS — I359 Nonrheumatic aortic valve disorder, unspecified: Secondary | ICD-10-CM

## 2013-11-13 DIAGNOSIS — J189 Pneumonia, unspecified organism: Secondary | ICD-10-CM

## 2013-11-13 DIAGNOSIS — I517 Cardiomegaly: Secondary | ICD-10-CM

## 2013-11-13 DIAGNOSIS — R0902 Hypoxemia: Secondary | ICD-10-CM

## 2013-11-13 MED ORDER — LEVOFLOXACIN 750 MG PO TABS: 750.0000 mg | ORAL_TABLET | Freq: Every day | ORAL | Status: DC

## 2013-11-13 MED ORDER — IOHEXOL 350 MG/ML SOLN: 80.0000 mL | Freq: Once | INTRAVENOUS | Status: AC | PRN

## 2013-11-13 NOTE — Patient Instructions (Signed)
Your physician recommends that you continue on your current medications as directed. Please refer to the Current Medication list given to you today.  Non-Cardiac CT Angiography (CTA), is a special type of CT scan that uses a computer to produce multi-dimensional views of major blood vessels throughout the body. In CT angiography, a contrast material is injected through an IV to help visualize the blood vessels  Your physician recommends that you schedule a follow-up appointment in: 1 month with Dr.Katz

## 2013-11-13 NOTE — Progress Notes (Signed)
Cardiology Office Note    Date:  11/13/2013   ID:  [name] [name], DOB [birth year], MRN [id number]  PCP:  Otilio Miu, MD  Cardiologist:  Dr. Cleatis Polka     History of Present Illness: [name] [name] is a 80 y.o. male with a history of mild AI and mild MR, diastolic dysfunction, CLL, polymyositis, HL, bradycardia. Last seen by Dr. Ron Parker 06/2013.  He was admitted to the hospital 7/24-7/27 with fever, cough and hypoxia. Chest x-ray was clear but suspected that he had bronchitis. He was treated with antibiotics. Echocardiogram was obtained and demonstrated normal LV function, mild diastolic dysfunction and severely dilated RV with moderate TR.    Since d/c, he still feels short of breath.  He is NYHA 2b.  This is unusual for him.  PCP is his neighbor.  She examined his lungs Sunday night.  He was told that he had fluid in his lungs.  Denies orthopnea, PND, edema, weight gain.  Has a nonproductive cough.  No chest pain.  No syncope.  No recent travel.  No hospitalizations prior to recent illness.     Studies:  - Echo (10/12):  Distal septal hypokinesis, EF 55%, mild AI, mild MR, moderate LAE  - Echo (7/15): EF 55-60%, normal wall motion, grade 1 diastolic dysfunction, trivial AI, mild MR mild LAE, severely dilated RV, moderate RAE, moderate TR   Recent Labs/Images: 11/01/2013: ALT 13; Pro B Natriuretic peptide (BNP) 283.7  11/02/2013: Creatinine 0.82; Potassium 4.2  11/03/2013: Hemoglobin 12.2*   Dg Chest 2 View   11/01/2013      IMPRESSION: 1. COPD/emphysema. Stable left lower lobe scarring. No acute cardiopulmonary disease. 2. Stable mild cardiomegaly without pulmonary edema.   Electronically Signed   By: Evangeline Dakin M.D.   On: 11/01/2013 12:18     Wt Readings from Last 3 Encounters:  11/01/13 125 lb (56.7 kg)  06/24/13 134 lb (60.782 kg)  06/14/13 133 lb (60.328 kg)     Past Medical History  Diagnosis Date  . CLL (chronic lymphoblastic leukemia) 2006    Rituxan treatment 2006    . Atrial septal aneurysm 2009    Insignificant, no, January, 2009  . Infection of PEG site     cellulitis.Marland KitchenResolved  . Vertebral compression fracture     multiple levels  . Herpes zoster     right chest  . Aortic insufficiency 2009    mild, Mild, echo, 2012  . Diastolic dysfunction     mild  . Chest pain     EF 55%, echo, October, 2009, possible inferior and posterior borderline hypokinesis, patient has never  had cardiac catheterization  . Edema     EF 55%, echo, October, 2009, possible borderline inferior and posterior hypokinesis... heart catheterization has never been done(July 10, 2009)  . Myositis     Caused pulmonary insufficiency with muscle weakness in the past / Dr.Willis-neurology, Imuran and prednisone  . Calculus of kidney   . Inguinal hernia without mention of obstruction or gangrene, unilateral or unspecified, (not specified as recurrent)     right   . Unspecified gastritis and gastroduodenitis without mention of hemorrhage   . Abdominal pain, epigastric   . Nonspecific elevation of levels of transaminase or lactic acid dehydrogenase (LDH)   . Feeding difficulties and mismanagement   . Edema of male genital organs     Resolved, occurred during illness related to myositis  . Swelling of arm     left.Marland KitchenMarland KitchenResolved.... no DVT  . LFT elevation  Related to Imuran in the past... dose was adjusted  . Dyslipidemia     statins not used due to LFT abnormalities  . Shortness of breath     Breathing abnormalities related to muscle weakness from myositis  . RBBB (right bundle branch block)     Old  . Bradycardia     Sinus bradycardia, old  . Ejection fraction     EF 60%, echo, October, 2012  . Mitral regurgitation     Mild / moderate, echo, 2012  . Right ventricular dysfunction     Mild, echo, 2012  . Febrile illness     May, 2013, question sinusitis  . Polymyositis 11/26/2012    Current Outpatient Prescriptions  Medication Sig Dispense Refill  . aspirin 81 MG  tablet Take 81 mg by mouth daily.      . calcium-vitamin D (CALCIUM 500+D) 500-200 MG-UNIT per tablet Take 1 tablet by mouth 2 (two) times daily.        . Cholecalciferol (VITAMIN D3) 1000 UNITS tablet Take 1,000 Units by mouth daily.        . citalopram (CELEXA) 40 MG tablet Take 40 mg by mouth daily.       . cyanocobalamin (,VITAMIN B-12,) 1000 MCG/ML injection Inject 1,000 mcg into the muscle every 30 (thirty) days. At the first of the month      . dextromethorphan (DELSYM) 30 MG/5ML liquid Take 5 mLs (30 mg total) by mouth 2 (two) times daily as needed for cough.  89 mL  0  . ferrous sulfate 325 (65 FE) MG tablet Take 325 mg by mouth daily with breakfast.        . fish oil-omega-3 fatty acids 1000 MG capsule Take 1 g by mouth daily.       . fluticasone (FLONASE) 50 MCG/ACT nasal spray Place 2 sprays into both nostrils daily.  16 g  3  . furosemide (LASIX) 40 MG tablet Take 40 mg by mouth daily.      Marland Kitchen guaiFENesin (MUCINEX) 600 MG 12 hr tablet Take 2 tablets (1,200 mg total) by mouth 2 (two) times daily.      Marland Kitchen HYDROcodone-acetaminophen (NORCO/VICODIN) 5-325 MG per tablet Take 1 tablet by mouth every 6 (six) hours as needed for pain.      Marland Kitchen levofloxacin (LEVAQUIN) 750 MG tablet Take 1 tablet (750 mg total) by mouth at bedtime.  4 tablet  0  . levofloxacin (LEVAQUIN) 750 MG tablet Take 1 tablet (750 mg total) by mouth daily.  7 tablet  0  . Multiple Vitamin (MULTIVITAMIN) capsule Take 1 capsule by mouth daily.        . mycophenolate (CELLCEPT) 500 MG tablet Take 1 tablet (500 mg total) by mouth 2 (two) times daily.  180 tablet  2  . potassium chloride SA (KLOR-CON M20) 20 MEQ tablet Take 1 tablet (20 mEq total) by mouth daily. Take one tablet by mouth once daily. Take an extra tablet twice a week.  120 tablet  3  . Resource Beneprotein PACK Take 3g three times a day      . temazepam (RESTORIL) 30 MG capsule Take 30 mg by mouth at bedtime.        . vitamin B-12 (CYANOCOBALAMIN) 1000 MCG tablet  Take 1,000 mcg by mouth daily.         No current facility-administered medications for this visit.     Allergies:   Sulfonamide derivatives   Social History:  The patient  reports that  he quit smoking about 33 years ago. His smoking use included Cigarettes. He has a 15.5 pack-year smoking history. He has never used smokeless tobacco. He reports that he drinks alcohol. He reports that he does not use illicit drugs.   Family History:  The patient's family history includes Breast cancer in his sister; Colon cancer in his brother; Diabetes in his grandchild; Emphysema in his father; Prostate cancer in his brother; Stroke in his mother.   ROS:  Please see the history of present illness.   No fever.   All other systems reviewed and negative.   PHYSICAL EXAM: VS:  BP 109/60  Pulse 64  Ht 5\' 5"  (1.651 m)  Wt 120 lb (54.432 kg)  BMI 19.97 kg/m2  SpO2 92% Well nourished, well developed, in no acute distress HEENT: normal Neck: no JVD Cardiac:  normal S1, S2; RRR; no murmur Lungs:  Bibasilar rales, no wheezing or rhonchi Abd: soft, nontender, no hepatomegaly Ext: no edema Skin: warm and dry Neuro:  CNs 2-12 intact, no focal abnormalities noted  EKG:  NSR, HR 64, normal axis, RBBB     ASSESSMENT AND PLAN:  Dyspnea:  RV enlarged on Echo and his O2 is somewhat low.  Concerned about pulmonary embolism.  Will get Chest CTA today.  Discussed with Dr. Cleatis Polka who reviewed his echo.  He has an appointment with pulmonary next week (Dr. Raul Del in [place]).  If CT neg for PE or edema, would follow up with pulmonary for further recommendations.   Right ventricular enlargement:  Dr. Ron Parker reviewed echo.  RV function ok.  RV may be larger than previous.  But, no 100% clear.  Proceed with CT as noted above.    Diastolic dysfunction:  BNP minimally elevated in the hospital.  Will see what CT shows.    Aortic insufficiency:  Trivial on recent echo.    Disposition:  F/u with Dr. Cleatis Polka  in 1 month.    Signed, Versie Starks, MHS 11/13/2013 2:10 PM    [facility] [place], [place], [place]  [address] Phone: [phone]; Fax: [phone]      Addendum:  Chest CT demonstrated no evidence of pulmonary embolism. There was evidence for multilobar right lung pneumonia, most pronounced in the right lower lobe. I have recommended that he remain on his current dose of Levaquin until he sees the pulmonologist next week. If his breathing should worsen between now and then, he should go back to the emergency room.  I will forward my note and the results of his chest CT to the pulmonologist. Signed,  Richardson Dopp, PA-C   11/13/2013 5:33 PM

## 2013-12-10 ENCOUNTER — Encounter: Payer: Self-pay | Admitting: Cardiology

## 2013-12-11 ENCOUNTER — Encounter: Payer: Self-pay | Admitting: Cardiology

## 2013-12-11 ENCOUNTER — Ambulatory Visit (INDEPENDENT_AMBULATORY_CARE_PROVIDER_SITE_OTHER): Payer: Medicare Other | Admitting: Cardiology

## 2013-12-11 VITALS — BP 114/44 | HR 51 | Ht 65.0 in | Wt 123.8 lb

## 2013-12-11 DIAGNOSIS — R0602 Shortness of breath: Secondary | ICD-10-CM

## 2013-12-11 DIAGNOSIS — I519 Heart disease, unspecified: Secondary | ICD-10-CM

## 2013-12-11 DIAGNOSIS — I351 Nonrheumatic aortic (valve) insufficiency: Secondary | ICD-10-CM

## 2013-12-11 DIAGNOSIS — R072 Precordial pain: Secondary | ICD-10-CM

## 2013-12-11 DIAGNOSIS — I5189 Other ill-defined heart diseases: Secondary | ICD-10-CM

## 2013-12-11 DIAGNOSIS — I359 Nonrheumatic aortic valve disorder, unspecified: Secondary | ICD-10-CM

## 2013-12-11 NOTE — Progress Notes (Signed)
[name], male   DOB: [birth year], 80 y.o.   MRN: [id number]    HPI  Patient is seen today to followup his shortness of breath. I saw him with [name] [name] in the office on November 13, 2013. At that time we decided to proceed with CT scan of the chest. This showed no pulmonary emboli. The patient also has been seen by pulmonary consultation closer to home. He says that there was no obvious new finding. His course of antibiotics was completed. His breathing has returned to baseline.  Allergies  Allergen Reactions  . Sulfonamide Derivatives     Current Outpatient Prescriptions  Medication Sig Dispense Refill  . albuterol (PROAIR HFA) 108 (90 BASE) MCG/ACT inhaler Inhale into the lungs.      Marland Kitchen aspirin 81 MG tablet Take 81 mg by mouth daily.      . calcium-vitamin D (CALCIUM 500+D) 500-200 MG-UNIT per tablet Take 1 tablet by mouth 2 (two) times daily.        . Cholecalciferol (VITAMIN D3) 1000 UNITS tablet Take 1,000 Units by mouth daily.        . citalopram (CELEXA) 40 MG tablet Take 40 mg by mouth daily.       . cyanocobalamin (,VITAMIN B-12,) 1000 MCG/ML injection Inject 1,000 mcg into the muscle every 30 (thirty) days. At the first of the month      . dextromethorphan (DELSYM) 30 MG/5ML liquid Take 5 mLs (30 mg total) by mouth 2 (two) times daily as needed for cough.  89 mL  0  . ferrous sulfate 325 (65 FE) MG tablet Take 325 mg by mouth daily with breakfast.        . fish oil-omega-3 fatty acids 1000 MG capsule Take 1 g by mouth daily.       . fluticasone (FLONASE) 50 MCG/ACT nasal spray Place 2 sprays into both nostrils daily.  16 g  3  . furosemide (LASIX) 40 MG tablet Take 40 mg by mouth daily.      Marland Kitchen HYDROcodone-acetaminophen (NORCO/VICODIN) 5-325 MG per tablet Take 1 tablet by mouth every 6 (six) hours as needed for pain.      . Multiple Vitamin (MULTIVITAMIN) capsule Take 1 capsule by mouth daily.        . mycophenolate (CELLCEPT) 500 MG tablet Take 1 tablet (500 mg total)  by mouth 2 (two) times daily.  180 tablet  2  . potassium chloride SA (KLOR-CON M20) 20 MEQ tablet Take 1 tablet (20 mEq total) by mouth daily. Take one tablet by mouth once daily. Take an extra tablet twice a week.  120 tablet  3  . Resource Beneprotein PACK Take 3g three times a day      . temazepam (RESTORIL) 30 MG capsule Take 30 mg by mouth at bedtime.        . vitamin B-12 (CYANOCOBALAMIN) 1000 MCG tablet Take 1,000 mcg by mouth daily.         No current facility-administered medications for this visit.    History   Social History  . Marital Status: Widowed    Spouse Name: N/A    Number of Children: 46  . Years of Education: college   Occupational History  . retired    Social History Main Topics  . Smoking status: Former Smoker -- 0.50 packs/day for 31 years    Types: Cigarettes    Quit date: 06/29/1980  . Smokeless tobacco: Never Used  . Alcohol Use: Yes  Comment: rarely  . Drug Use: No  . Sexual Activity: Not on file   Other Topics Concern  . Not on file   Social History Narrative  . No narrative on file    Family History  Problem Relation Age of Onset  . Breast cancer Sister   . Colon cancer Brother   . Prostate cancer Brother   . Stroke Mother   . Emphysema Father   . Diabetes Grandchild   . Hypertension Mother   . Heart attack Neg Hx   . Cancer Brother   . Cancer Sister   . Cancer Daughter     Past Medical History  Diagnosis Date  . CLL (chronic lymphoblastic leukemia) 2006    Rituxan treatment 2006  . Atrial septal aneurysm 2009    Insignificant, no, January, 2009  . Infection of PEG site     cellulitis.Marland KitchenResolved  . Vertebral compression fracture     multiple levels  . Herpes zoster     right chest  . Aortic insufficiency 2009    mild, Mild, echo, 2012  . Diastolic dysfunction     mild  . Chest pain     EF 55%, echo, October, 2009, possible inferior and posterior borderline hypokinesis, patient has never  had cardiac catheterization    . Edema     EF 55%, echo, October, 2009, possible borderline inferior and posterior hypokinesis... heart catheterization has never been done(July 10, 2009)  . Myositis     Caused pulmonary insufficiency with muscle weakness in the past / Dr.Willis-neurology, Imuran and prednisone  . Calculus of kidney   . Inguinal hernia without mention of obstruction or gangrene, unilateral or unspecified, (not specified as recurrent)     right   . Unspecified gastritis and gastroduodenitis without mention of hemorrhage   . Abdominal pain, epigastric   . Nonspecific elevation of levels of transaminase or lactic acid dehydrogenase (LDH)   . Feeding difficulties and mismanagement   . Edema of male genital organs     Resolved, occurred during illness related to myositis  . Swelling of arm     left.Marland KitchenMarland KitchenResolved.... no DVT  . LFT elevation     Related to Imuran in the past... dose was adjusted  . Dyslipidemia     statins not used due to LFT abnormalities  . Shortness of breath     Breathing abnormalities related to muscle weakness from myositis  . RBBB (right bundle branch block)     Old  . Bradycardia     Sinus bradycardia, old  . Ejection fraction     EF 60%, echo, October, 2012  . Mitral regurgitation     Mild / moderate, echo, 2012  . Right ventricular dysfunction     Mild, echo, 2012  . Febrile illness     May, 2013, question sinusitis  . Polymyositis 11/26/2012    Past Surgical History  Procedure Laterality Date  . Splenectomy    . Hernia repair      left side  . Peg placement  06/2007  . Peg tube removal  12/2007  . Kyphosis surgery      multilevel vertebral  . Odontoid fracture surgery      anterior screw fixation  . Nasal sinus surgery      x2  . Cataract extraction      bi lateral    Patient Active Problem List   Diagnosis Date Noted  . RBBB (right bundle branch block)     Priority: High  .  Bradycardia     Priority: High  . Ejection fraction     Priority: High  . Aortic  insufficiency     Priority: High  . Right ventricular dysfunction     Priority: High  . Mitral regurgitation     Priority: High  . Diastolic dysfunction     Priority: High  . Chest pain     Priority: High  . Edema     Priority: High  . Dyslipidemia     Priority: High  . Shortness of breath     Priority: High  . Hypoxia 11/01/2013  . Acute bronchitis 11/01/2013  . Polymyositis 11/26/2012  . Febrile illness   . CLL (chronic lymphoblastic leukemia)   . Atrial septal aneurysm   . Vertebral compression fracture   . Herpes zoster   . Myositis   . HYPOKALEMIA 01/19/2009  . CLL 08/01/2008  . HYPERLIPIDEMIA-MIXED 07/22/2008  . ANEMIA 07/22/2008  . NEPHROLITHIASIS 07/22/2008  . EDEMA OF MALE GENITAL ORGANS 07/22/2008  . MYOSITIS 07/22/2008  . OSTEOPOROSIS 07/22/2008  . GASTRITIS 06/30/2008  . INGUINAL HERNIA, RIGHT 06/30/2008  . ABDOMINAL PAIN, EPIGASTRIC 05/29/2008  . FEEDING PROBLEM 12/24/2007  . ABNORMAL TRANSAMINASE-LFT'S 12/24/2007  . DYSPNEA 05/22/2007    ROS   Patient denies fever, chills, headache, sweats, rash, change in vision, change in hearing, chest pain, cough, nausea vomiting, urinary symptoms. All other systems are reviewed and are negative.  PHYSICAL EXAM  He is stable today. He is a small Band-Aid on the right side of his nose. He is oriented to person time and place. Affect is normal. Head is atraumatic. Sclera and conjunctiva are normal. There is no jugulovenous distention. Lung exam reveals chronic fine crackles at the base. There is no respiratory distress. Cardiac exam reveals an S1 and S2. The abdomen is soft. There is no peripheral edema.  Filed Vitals:   12/11/13 1042  BP: 114/44  Pulse: 51  Height: 5\' 5"  (1.651 m)  Weight: 123 lb 12.8 oz (56.155 kg)  SpO2: 96%     ASSESSMENT & PLAN

## 2013-12-11 NOTE — Assessment & Plan Note (Signed)
Aortic valvular disease is very mild. No further workup

## 2013-12-11 NOTE — Assessment & Plan Note (Signed)
He has not been having any significant chest pain. No further workup.

## 2013-12-11 NOTE — Patient Instructions (Signed)
Your physician recommends that you continue on your current medications as directed. Please refer to the Current Medication list given to you today.  Your physician wants you to follow-up in: 6 months. You will receive a reminder letter in the mail two months in advance. If you don't receive a letter, please call our office to schedule the follow-up appointment.  

## 2013-12-11 NOTE — Assessment & Plan Note (Signed)
The patient has had right ventricular dysfunction in the past. There is question that it may be slightly worse recently when he had his pneumonia. No further workup.

## 2013-12-11 NOTE — Assessment & Plan Note (Signed)
His most recent shortness of breath may have been due to an infectious problem. His antibiotics are completed and he feels better. He is seeing his pulmonologist closer to home.

## 2013-12-11 NOTE — Assessment & Plan Note (Signed)
He has some diastolic dysfunction. He remains on furosemide and is stable.

## 2013-12-17 ENCOUNTER — Encounter: Payer: Self-pay | Admitting: Neurology

## 2013-12-17 ENCOUNTER — Ambulatory Visit (INDEPENDENT_AMBULATORY_CARE_PROVIDER_SITE_OTHER): Payer: Medicare Other | Admitting: Neurology

## 2013-12-17 VITALS — BP 129/75 | HR 61 | Ht 64.75 in | Wt 127.0 lb

## 2013-12-17 DIAGNOSIS — M3320 Polymyositis, organ involvement unspecified: Secondary | ICD-10-CM

## 2013-12-17 MED ORDER — MYCOPHENOLATE MOFETIL 500 MG PO TABS: 500.0000 mg | ORAL_TABLET | Freq: Two times a day (BID) | ORAL | Status: DC

## 2013-12-17 NOTE — Progress Notes (Signed)
Reason for visit: Polymyositis  [name] [name] is an 80 y.o. male  History of present illness:  [name] [name] is an 80 year old right-handed white male with a history of polymyositis. The patient is on CellCept, and he has been quite stable on this drug. The patient recently was in the hospital toward the end of July 2015 with bronchitis and pneumonia. The patient has recovered from this. He indicates that his energy level has returned back to normal, and he remains quite active. The patient denies any problems with worsening muscle weakness. He denies any falls. The patient denies any issues with swallowing. This office for an evaluation. He has had recent blood work done when he was in the hospital.  Past Medical History  Diagnosis Date  . CLL (chronic lymphoblastic leukemia) 2006    Rituxan treatment 2006  . Atrial septal aneurysm 2009    Insignificant, no, January, 2009  . Infection of PEG site     cellulitis.Marland KitchenResolved  . Vertebral compression fracture     multiple levels  . Herpes zoster     right chest  . Aortic insufficiency 2009    mild, Mild, echo, 2012  . Diastolic dysfunction     mild  . Chest pain     EF 55%, echo, October, 2009, possible inferior and posterior borderline hypokinesis, patient has never  had cardiac catheterization  . Edema     EF 55%, echo, October, 2009, possible borderline inferior and posterior hypokinesis... heart catheterization has never been done(July 10, 2009)  . Myositis     Caused pulmonary insufficiency with muscle weakness in the past / Dr.Antone Summons-neurology, Imuran and prednisone  . Calculus of kidney   . Inguinal hernia without mention of obstruction or gangrene, unilateral or unspecified, (not specified as recurrent)     right   . Unspecified gastritis and gastroduodenitis without mention of hemorrhage   . Abdominal pain, epigastric   . Nonspecific elevation of levels of transaminase or lactic acid dehydrogenase (LDH)   . Feeding  difficulties and mismanagement   . Edema of male genital organs     Resolved, occurred during illness related to myositis  . Swelling of arm     left.Marland KitchenMarland KitchenResolved.... no DVT  . LFT elevation     Related to Imuran in the past... dose was adjusted  . Dyslipidemia     statins not used due to LFT abnormalities  . Shortness of breath     Breathing abnormalities related to muscle weakness from myositis  . RBBB (right bundle branch block)     Old  . Bradycardia     Sinus bradycardia, old  . Ejection fraction     EF 60%, echo, October, 2012  . Mitral regurgitation     Mild / moderate, echo, 2012  . Right ventricular dysfunction     Mild, echo, 2012  . Febrile illness     May, 2013, question sinusitis  . Polymyositis 11/26/2012    Past Surgical History  Procedure Laterality Date  . Splenectomy    . Hernia repair      left side  . Peg placement  06/2007  . Peg tube removal  12/2007  . Kyphosis surgery      multilevel vertebral  . Odontoid fracture surgery      anterior screw fixation  . Nasal sinus surgery      x2  . Cataract extraction      bi lateral    Family History  Problem Relation Age of  Onset  . Breast cancer Sister   . Colon cancer Brother   . Prostate cancer Brother   . Stroke Mother   . Emphysema Father   . Diabetes Grandchild   . Hypertension Mother   . Heart attack Neg Hx   . Cancer Brother   . Cancer Sister   . Cancer Daughter     Social history:  reports that he quit smoking about 33 years ago. His smoking use included Cigarettes. He has a 15.5 pack-year smoking history. He has never used smokeless tobacco. He reports that he drinks alcohol. He reports that he does not use illicit drugs.    Allergies  Allergen Reactions  . Sulfonamide Derivatives     Medications:  Current Outpatient Prescriptions on File Prior to Visit  Medication Sig Dispense Refill  . albuterol (PROAIR HFA) 108 (90 BASE) MCG/ACT inhaler Inhale into the lungs.      Marland Kitchen aspirin 81  MG tablet Take 81 mg by mouth daily.      . calcium-vitamin D (CALCIUM 500+D) 500-200 MG-UNIT per tablet Take 1 tablet by mouth 2 (two) times daily.        . Cholecalciferol (VITAMIN D3) 1000 UNITS tablet Take 1,000 Units by mouth daily.        . citalopram (CELEXA) 40 MG tablet Take 40 mg by mouth daily.       . cyanocobalamin (,VITAMIN B-12,) 1000 MCG/ML injection Inject 1,000 mcg into the muscle every 30 (thirty) days. At the first of the month      . dextromethorphan (DELSYM) 30 MG/5ML liquid Take 5 mLs (30 mg total) by mouth 2 (two) times daily as needed for cough.  89 mL  0  . ferrous sulfate 325 (65 FE) MG tablet Take 325 mg by mouth daily with breakfast.        . fish oil-omega-3 fatty acids 1000 MG capsule Take 1 g by mouth daily.       . fluticasone (FLONASE) 50 MCG/ACT nasal spray Place 2 sprays into both nostrils daily.  16 g  3  . furosemide (LASIX) 40 MG tablet Take 40 mg by mouth daily.      Marland Kitchen HYDROcodone-acetaminophen (NORCO/VICODIN) 5-325 MG per tablet Take 1 tablet by mouth every 6 (six) hours as needed for pain.      . Multiple Vitamin (MULTIVITAMIN) capsule Take 1 capsule by mouth daily.        . potassium chloride SA (KLOR-CON M20) 20 MEQ tablet Take 1 tablet (20 mEq total) by mouth daily. Take one tablet by mouth once daily. Take an extra tablet twice a week.  120 tablet  3  . Resource Beneprotein PACK Take 3g three times a day      . temazepam (RESTORIL) 30 MG capsule Take 30 mg by mouth at bedtime.        . vitamin B-12 (CYANOCOBALAMIN) 1000 MCG tablet Take 1,000 mcg by mouth daily.         No current facility-administered medications on file prior to visit.    ROS:  Out of a complete 14 system review of symptoms, the patient complains only of the following symptoms, and all other reviewed systems are negative.  Ringing in the ears Insomnia Back pain, achy muscles Bruising easily  Blood pressure 129/75, pulse 61, height 5' 4.75" (1.645 m), weight 127 lb (57.607  kg).  Physical Exam  General: The patient is alert and cooperative at the time of the examination.  Skin: 1+ edema of  ankles is noted bilaterally.   Neurologic Exam  Mental status: The patient is oriented x 3.  Cranial nerves: Facial symmetry is present. Speech is normal, no aphasia or dysarthria is noted. Extraocular movements are full. Visual fields are full.  Motor: The patient has good strength in all 4 extremities. The patient is able to arise from a seated position with arms crossed.  Sensory examination: Soft touch sensation is symmetric on the face, arms, and legs.  Coordination: The patient has good finger-nose-finger and heel-to-shin bilaterally.  Gait and station: The patient has a normal gait. Tandem gait is normal. Romberg is negative. No drift is seen.  Reflexes: Deep tendon reflexes are symmetric.   Assessment/Plan:  1. Polymyositis  The patient will continue on Aricept, prescription was given to reduce the medication. He will followup in about 6 months. We will check blood work sometime within the next 2 or 3 months.  Jill Alexanders MD 12/17/2013 9:13 AM  [facility] [address] [place], [place] [address]  Phone [phone] Fax [phone]

## 2013-12-17 NOTE — Patient Instructions (Signed)
Polymyositis Polymyositis is one type of inflammatory myopathy. Inflammatory myopathies are muscle diseases that involve redness, soreness, and swelling (inflammation) of the muscles. Polymyositis causes decreased muscle power. It affects the connective tissues of the body (connective tissue disease). It is often associated with diseases in which the body attacks its own cells (autoimmune diseases). Polymyositis begins gradually. It generally affects adults in their 30s to 50s. CAUSES  The cause is often unknown. Some cases are caused by a bacteria, virus, or parasite infection. This can trigger autoantibodies, which attack normal, healthy cells. SYMPTOMS   The most common symptom is muscle weakness. This often affects muscles close to the trunk of the body (proximal). Muscles not close to the trunk (distal) may also be affected later on in the disease.  Eventually, patients have trouble:  Rising from a sitting position.  Climbing stairs.  Lifting objects.  Reaching overhead.  Trouble swallowing (dysphagia) may occur.  Rarely, the muscles ache and are tender to touch. Associated conditions include:  Fingers, toes, nose, and ears that turn pale when exposed to cold (Raynaud's phenomenon).  Other connective tissue diseases. This may include lupus, rheumatoid arthritis, or scleroderma.  Heart disease. This is caused by inflammation of the heart muscle. DIAGNOSIS  Diagnosis can be difficult. It is based on symptoms and a thorough exam. Further testing may be done, including:  Blood tests to check for high levels of muscle enzymes, which suggest muscle damage, and specific autoantibodies.  Computerized magnetic scan (MRI).  Test to check electrical activity of the muscle (electromyography).  Exam of a muscle tissue sample (biopsy). TREATMENT  There is no cure. Treatment can improve muscle strength and function. The earlier treatment is started, the more effective it is. Early  treatment often means fewer complications.  Drug treatments may include:  Corticosteroids, especially prednisone. Patients often improve in about 2 to 4 weeks. However, drug therapy may be needed for years. Long-term use of corticosteroids can have serious side effects. Your caregiver may advise supplements or may prescribe other medicines.  Corticosteroid-sparing agents.  Intravenous immunoglobulin (IVIG). This contains healthy antibodies from blood donors.  Immunosuppressive therapies, such as tacrolimus.  Physical therapy is often advised. This helps stop muscles from wasting away (atrophy).  Chewing and swallowing can be more difficult later on in the disease. A registered dietitian can teach you how to make foods that are easy to eat.  Speech therapy can be helpful if your swallowing muscles are weakened. Results from therapy vary depending on the severity of the disease. Rarely, the disease may be life-threatening to patients with severe, progressive muscle weakness, dysphagia, malnutrition, pneumonia, or respiratory failure. HOME CARE INSTRUCTIONS   Stay active. An exercise routine can help you build and maintain muscle strength. It is important to get a detailed plan and recommendations from your caregiver or physical therapist before starting an exercise program.  Rest when you are tired. Do not wait until you are exhausted to rest. Learn to pace yourself. SEEK MEDICAL CARE IF:  You develop new or worsening muscle weakness. SEEK IMMEDIATE MEDICAL CARE IF:   You have trouble swallowing or speaking.  You develop shortness of breath. FOR MORE INFORMATION  The Myositis Association: www.myositis.org National Institute of Neurological Disorders and Stroke: www.ninds.nih.gov Document Released: 03/18/2002 Document Revised: 06/20/2011 Document Reviewed: 07/30/2009 Exit[employer] Patient Information 2015 [employer]. This information is not intended to replace advice given to you by  your health care provider. Make sure you discuss any questions you have with your health   care provider.  

## 2013-12-24 ENCOUNTER — Telehealth: Payer: Self-pay

## 2013-12-24 NOTE — Telephone Encounter (Signed)
[employer] notified us the have approved our request for coverage on Mycophenolate effective until 12/18/2014.

## 2014-03-18 ENCOUNTER — Telehealth: Payer: Self-pay | Admitting: Neurology

## 2014-03-18 NOTE — Telephone Encounter (Signed)
Prescription for blood work was place in mail today.

## 2014-03-18 NOTE — Telephone Encounter (Signed)
I called the patient. The patient is to have blood work done, he wants it done in [place], I will send him a prescription with the blood work request on this.

## 2014-03-18 NOTE — Telephone Encounter (Signed)
-----   Message from Kathrynn Ducking, MD sent at 12/17/2013  9:15 AM EDT ----- Recheck CBC, comprehensive metabolic profile

## 2014-03-21 ENCOUNTER — Ambulatory Visit: Payer: Self-pay | Admitting: Oncology

## 2014-03-21 LAB — COMPREHENSIVE METABOLIC PANEL
ALBUMIN: 3.7 g/dL (ref 3.4–5.0)
ANION GAP: 5 — AB (ref 7–16)
Alkaline Phosphatase: 92 U/L
BUN: 20 mg/dL — ABNORMAL HIGH (ref 7–18)
Bilirubin,Total: 1 mg/dL (ref 0.2–1.0)
Calcium, Total: 9.2 mg/dL (ref 8.5–10.1)
Chloride: 102 mmol/L (ref 98–107)
Co2: 32 mmol/L (ref 21–32)
Creatinine: 1.06 mg/dL (ref 0.60–1.30)
EGFR (Non-African Amer.): >60
GLUCOSE: 93 mg/dL (ref 65–99)
Osmolality: 280 (ref 275–301)
POTASSIUM: 4.6 mmol/L (ref 3.5–5.1)
SGOT(AST): 36 U/L (ref 15–37)
SGPT (ALT): 22 U/L
SODIUM: 139 mmol/L (ref 136–145)
Total Protein: 7 g/dL (ref 6.4–8.2)

## 2014-03-21 LAB — CBC CANCER CENTER
Basophil #: 0.1 x10 3/mm (ref 0.0–0.1)
Basophil %: 0.5 %
EOS ABS: 0.1 x10 3/mm (ref 0.0–0.7)
Eosinophil %: 0.8 %
HCT: 39.6 % — ABNORMAL LOW (ref 40.0–52.0)
HGB: 12.6 g/dL — ABNORMAL LOW (ref 13.0–18.0)
Lymphocyte #: 13.6 x10 3/mm — ABNORMAL HIGH (ref 1.0–3.6)
Lymphocyte %: 75.5 %
MCH: 33.2 pg (ref 26.0–34.0)
MCHC: 31.9 g/dL — AB (ref 32.0–36.0)
MCV: 104 fL — ABNORMAL HIGH (ref 80–100)
Monocyte #: 2 x10 3/mm — ABNORMAL HIGH (ref 0.2–1.0)
Monocyte %: 11.2 %
Neutrophil #: 2.2 x10 3/mm (ref 1.4–6.5)
Neutrophil %: 12 %
PLATELETS: 155 x10 3/mm (ref 150–440)
RBC: 3.8 10*6/uL — AB (ref 4.40–5.90)
RDW: 16.1 % — ABNORMAL HIGH (ref 11.5–14.5)
WBC: 18 x10 3/mm — AB (ref 3.8–10.6)

## 2014-04-07 ENCOUNTER — Encounter: Payer: Self-pay | Admitting: Neurology

## 2014-04-11 ENCOUNTER — Ambulatory Visit: Payer: Self-pay | Admitting: Oncology

## 2014-06-17 ENCOUNTER — Encounter: Payer: Self-pay | Admitting: Neurology

## 2014-06-17 ENCOUNTER — Ambulatory Visit (INDEPENDENT_AMBULATORY_CARE_PROVIDER_SITE_OTHER): Payer: Medicare Other | Admitting: Neurology

## 2014-06-17 VITALS — BP 127/69 | HR 54 | Ht 65.0 in | Wt 138.8 lb

## 2014-06-17 DIAGNOSIS — Z5181 Encounter for therapeutic drug level monitoring: Secondary | ICD-10-CM | POA: Diagnosis not present

## 2014-06-17 DIAGNOSIS — M3320 Polymyositis, organ involvement unspecified: Secondary | ICD-10-CM | POA: Diagnosis not present

## 2014-06-17 NOTE — Progress Notes (Signed)
Reason for visit: Polymyositis  [name] [name] is an 81 y.o. male  History of present illness:  [name] [name] is an 82 year old right-handed white male with a history of polymyositis. The patient has been relatively stable on CellCept. The patient has chronically elevated white blood counts associated with CLL. He has done relatively well since last seen without any changes in his strength. He has no difficulty getting out of chairs or climbing stairs. He is on Aricept for minimum cognitive impairment, he indicates that his memory has been stable, he continues to operate a motor vehicle without difficulty. He has not given up any activities of daily living secondary to memory problems. He recently has injured his left knee. The pain continues with the knee, and he will be seen in orthopedic surgeon this week. He returns for an evaluation. He last had blood work done in December 2015.   Past Medical History  Diagnosis Date  . CLL (chronic lymphoblastic leukemia) 2006    Rituxan treatment 2006  . Atrial septal aneurysm 2009    Insignificant, no, January, 2009  . Infection of PEG site     cellulitis.Marland KitchenResolved  . Vertebral compression fracture     multiple levels  . Herpes zoster     right chest  . Aortic insufficiency 2009    mild, Mild, echo, 2012  . Diastolic dysfunction     mild  . Chest pain     EF 55%, echo, October, 2009, possible inferior and posterior borderline hypokinesis, patient has never  had cardiac catheterization  . Edema     EF 55%, echo, October, 2009, possible borderline inferior and posterior hypokinesis... heart catheterization has never been done(July 10, 2009)  . Myositis     Caused pulmonary insufficiency with muscle weakness in the past / Dr.Willis-neurology, Imuran and prednisone  . Calculus of kidney   . Inguinal hernia without mention of obstruction or gangrene, unilateral or unspecified, (not specified as recurrent)     right   . Unspecified gastritis and  gastroduodenitis without mention of hemorrhage   . Abdominal pain, epigastric   . Nonspecific elevation of levels of transaminase or lactic acid dehydrogenase (LDH)   . Feeding difficulties and mismanagement   . Edema of male genital organs     Resolved, occurred during illness related to myositis  . Swelling of arm     left.Marland KitchenMarland KitchenResolved.... no DVT  . LFT elevation     Related to Imuran in the past... dose was adjusted  . Dyslipidemia     statins not used due to LFT abnormalities  . Shortness of breath     Breathing abnormalities related to muscle weakness from myositis  . RBBB (right bundle branch block)     Old  . Bradycardia     Sinus bradycardia, old  . Ejection fraction     EF 60%, echo, October, 2012  . Mitral regurgitation     Mild / moderate, echo, 2012  . Right ventricular dysfunction     Mild, echo, 2012  . Febrile illness     May, 2013, question sinusitis  . Polymyositis 11/26/2012    Past Surgical History  Procedure Laterality Date  . Splenectomy    . Hernia repair      left side  . Peg placement  06/2007  . Peg tube removal  12/2007  . Kyphosis surgery      multilevel vertebral  . Odontoid fracture surgery      anterior screw fixation  .  Nasal sinus surgery      x2  . Cataract extraction      bi lateral    Family History  Problem Relation Age of Onset  . Breast cancer Sister   . Colon cancer Brother   . Prostate cancer Brother   . Stroke Mother   . Hypertension Mother   . Emphysema Father   . Diabetes Grandchild   . Heart attack Neg Hx   . Cancer Brother   . Cancer Sister   . Cancer Daughter     Social history:  reports that he quit smoking about 33 years ago. His smoking use included Cigarettes. He has a 15.5 pack-year smoking history. He has never used smokeless tobacco. He reports that he drinks alcohol. He reports that he does not use illicit drugs.    Allergies  Allergen Reactions  . Sulfonamide Derivatives     Medications:  Prior to  Admission medications   Medication Sig Start Date End Date Taking? Authorizing Provider  aspirin 81 MG tablet Take 81 mg by mouth daily.   Yes Historical Provider, MD  calcium-vitamin D (CALCIUM 500+D) 500-200 MG-UNIT per tablet Take 1 tablet by mouth 2 (two) times daily.     Yes Historical Provider, MD  Cholecalciferol (VITAMIN D3) 1000 UNITS tablet Take 1,000 Units by mouth daily.     Yes Historical Provider, MD  citalopram (CELEXA) 40 MG tablet Take 40 mg by mouth daily.  10/23/11  Yes Historical Provider, MD  cyanocobalamin (,VITAMIN B-12,) 1000 MCG/ML injection Inject 1,000 mcg into the muscle every 30 (thirty) days. At the first of the month   Yes Historical Provider, MD  ferrous sulfate 325 (65 FE) MG tablet Take 325 mg by mouth daily with breakfast.     Yes Historical Provider, MD  fish oil-omega-3 fatty acids 1000 MG capsule Take 1 g by mouth daily.    Yes Historical Provider, MD  furosemide (LASIX) 40 MG tablet Take 40 mg by mouth daily.   Yes Historical Provider, MD  HYDROcodone-acetaminophen (NORCO/VICODIN) 5-325 MG per tablet Take 1 tablet by mouth every 6 (six) hours as needed for pain.   Yes Historical Provider, MD  Multiple Vitamin (MULTIVITAMIN) capsule Take 1 capsule by mouth daily.     Yes Historical Provider, MD  mycophenolate (CELLCEPT) 500 MG tablet Take 1 tablet (500 mg total) by mouth 2 (two) times daily. 12/17/13  Yes Kathrynn Ducking, MD  potassium chloride SA (KLOR-CON M20) 20 MEQ tablet Take 1 tablet (20 mEq total) by mouth daily. Take one tablet by mouth once daily. Take an extra tablet twice a week. 08/07/13  Yes Carlena Bjornstad, MD  Resource Beneprotein PACK Take 3g three times a day   Yes Historical Provider, MD  temazepam (RESTORIL) 30 MG capsule Take 30 mg by mouth at bedtime.     Yes Historical Provider, MD  vitamin B-12 (CYANOCOBALAMIN) 1000 MCG tablet Take 1,000 mcg by mouth daily.     Yes Historical Provider, MD    ROS:  Out of a complete 14 system review of  symptoms, the patient complains only of the following symptoms, and all other reviewed systems are negative.  Ringing in the ears Cold intolerance Insomnia Joint pain Bruising easily  Blood pressure 127/69, pulse 54, height 5\' 5"  (1.651 m), weight 138 lb 12.8 oz (62.959 kg).  Physical Exam  General: The patient is alert and cooperative at the time of the examination.  Skin: No significant peripheral edema is noted.  Neurologic Exam  Mental status: The patient is oriented x 3. The patient appears to have normal short and long-term memory.  Cranial nerves: Facial symmetry is present. Speech is normal, no aphasia or dysarthria is noted. Extraocular movements are full. Visual fields are full.  Motor: The patient has good strength in all 4 extremities. The patient is able to rise from a seated position with arms crossed.  Sensory examination: Soft touch sensation is symmetric on the face, arms, and legs.  Coordination: The patient has good finger-nose-finger and heel-to-shin bilaterally.  Gait and station: The patient has a normal gait. Tandem gait is normal. Romberg is negative. No drift is seen.  Reflexes: Deep tendon reflexes are symmetric.   Assessment/Plan:  1. Polymyositis  2. Mild memory disturbance  3. Recent left knee injury  The patient will be seeing an orthopedic surgeon in the near future. His memory issues and polymyositis appear to be relatively stable. He will continue the CellCept, he is to have blood work done today. He will follow-up in 6 months.  Jill Alexanders MD 06/17/2014 8:44 PM  [facility] [address] [place], [place] [address]  Phone [phone] Fax [phone]

## 2014-06-17 NOTE — Patient Instructions (Signed)
Polymyositis Polymyositis is one type of inflammatory myopathy. Inflammatory myopathies are muscle diseases that involve redness, soreness, and swelling (inflammation) of the muscles. Polymyositis causes decreased muscle power. It affects the connective tissues of the body (connective tissue disease). It is often associated with diseases in which the body attacks its own cells (autoimmune diseases). Polymyositis begins gradually. It generally affects adults in their 34s to 15s. CAUSES  The cause is often unknown. Some cases are caused by a bacteria, virus, or parasite infection. This can trigger autoantibodies, which attack normal, healthy cells. SYMPTOMS   The most common symptom is muscle weakness. This often affects muscles close to the trunk of the body (proximal). Muscles not close to the trunk (distal) may also be affected later on in the disease.  Eventually, patients have trouble:  Rising from a sitting position.  Climbing stairs.  Lifting objects.  Reaching overhead.  Trouble swallowing (dysphagia) may occur.  Rarely, the muscles ache and are tender to touch. Associated conditions include:  Fingers, toes, nose, and ears that turn pale when exposed to cold (Raynaud's phenomenon).  Other connective tissue diseases. This may include lupus, rheumatoid arthritis, or scleroderma.  Heart disease. This is caused by inflammation of the heart muscle. DIAGNOSIS  Diagnosis can be difficult. It is based on symptoms and a thorough exam. Further testing may be done, including:  Blood tests to check for high levels of muscle enzymes, which suggest muscle damage, and specific autoantibodies.  Computerized magnetic scan (MRI).  Test to check electrical activity of the muscle (electromyography).  Exam of a muscle tissue sample (biopsy). TREATMENT  There is no cure. Treatment can improve muscle strength and function. The earlier treatment is started, the more effective it is. Early  treatment often means fewer complications.  Drug treatments may include:  Corticosteroids, especially prednisone. Patients often improve in about 2 to 4 weeks. However, drug therapy may be needed for years. Long-term use of corticosteroids can have serious side effects. Your caregiver may advise supplements or may prescribe other medicines.  Corticosteroid-sparing agents.  Intravenous immunoglobulin (IVIG). This contains healthy antibodies from blood donors.  Immunosuppressive therapies, such as tacrolimus.  Physical therapy is often advised. This helps stop muscles from wasting away (atrophy).  Chewing and swallowing can be more difficult later on in the disease. A registered dietitian can teach you how to make foods that are easy to eat.  Speech therapy can be helpful if your swallowing muscles are weakened. Results from therapy vary depending on the severity of the disease. Rarely, the disease may be life-threatening to patients with severe, progressive muscle weakness, dysphagia, malnutrition, pneumonia, or respiratory failure. HOME CARE INSTRUCTIONS   Stay active. An exercise routine can help you build and maintain muscle strength. It is important to get a detailed plan and recommendations from your caregiver or physical therapist before starting an exercise program.  Rest when you are tired. Do not wait until you are exhausted to rest. Learn to pace yourself. SEEK MEDICAL CARE IF:  You develop new or worsening muscle weakness. SEEK IMMEDIATE MEDICAL CARE IF:   You have trouble swallowing or speaking.  You develop shortness of breath. FOR MORE INFORMATION  The Myositis Association: www.myositis.[facility]: [employer]Box[employer].it Document Released: 03/18/2002 Document Revised: 06/20/2011 Document Reviewed: 07/30/2009 [facility] Patient Information 2015 [place], [place]. This information is not intended to replace advice given to you by  your health care provider. Make sure you discuss any questions you have with your health  care provider.  

## 2014-06-18 LAB — CBC WITH DIFFERENTIAL/PLATELET
BASOS ABS: 0.1 10*3/uL (ref 0.0–0.2)
Basos: 0 %
EOS: 1 %
Eosinophils Absolute: 0.2 10*3/uL (ref 0.0–0.4)
HEMATOCRIT: 37.8 % (ref 37.5–51.0)
Hemoglobin: 12 g/dL — ABNORMAL LOW (ref 12.6–17.7)
IMMATURE GRANS (ABS): 0 10*3/uL (ref 0.0–0.1)
Immature Granulocytes: 0 %
LYMPHS ABS: 18.2 10*3/uL — AB (ref 0.7–3.1)
LYMPHS: 81 %
MCH: 32.8 pg (ref 26.6–33.0)
MCHC: 31.7 g/dL (ref 31.5–35.7)
MCV: 103 fL — ABNORMAL HIGH (ref 79–97)
MONOS ABS: 1.8 10*3/uL — AB (ref 0.1–0.9)
Monocytes: 8 %
NEUTROS PCT: 10 %
Neutrophils Absolute: 2.1 10*3/uL (ref 1.4–7.0)
Platelets: 180 10*3/uL (ref 150–379)
RBC: 3.66 x10E6/uL — AB (ref 4.14–5.80)
RDW: 15.3 % (ref 12.3–15.4)
WBC: 21.2 10*3/uL (ref 3.4–10.8)
nRBC: 1 % — ABNORMAL HIGH (ref 0–0)

## 2014-06-18 LAB — COMPREHENSIVE METABOLIC PANEL
ALBUMIN: 4.1 g/dL (ref 3.5–4.7)
ALT: 13 IU/L (ref 0–44)
AST: 27 IU/L (ref 0–40)
Albumin/Globulin Ratio: 1.9 (ref 1.1–2.5)
Alkaline Phosphatase: 114 IU/L (ref 39–117)
BUN / CREAT RATIO: 22 (ref 10–22)
BUN: 18 mg/dL (ref 8–27)
Bilirubin Total: 0.8 mg/dL (ref 0.0–1.2)
CALCIUM: 9.4 mg/dL (ref 8.6–10.2)
CO2: 25 mmol/L (ref 18–29)
Chloride: 101 mmol/L (ref 97–108)
Creatinine, Ser: 0.83 mg/dL (ref 0.76–1.27)
GFR calc Af Amer: 95 mL/min/{1.73_m2} (ref >59)
GFR calc non Af Amer: 82 mL/min/{1.73_m2} (ref >59)
Globulin, Total: 2.2 g/dL (ref 1.5–4.5)
Glucose: 75 mg/dL (ref 65–99)
Potassium: 4.8 mmol/L (ref 3.5–5.2)
SODIUM: 140 mmol/L (ref 134–144)
Total Protein: 6.3 g/dL (ref 6.0–8.5)

## 2014-07-14 ENCOUNTER — Ambulatory Visit (INDEPENDENT_AMBULATORY_CARE_PROVIDER_SITE_OTHER): Payer: Medicare Other | Admitting: Cardiology

## 2014-07-14 ENCOUNTER — Encounter: Payer: Self-pay | Admitting: Cardiology

## 2014-07-14 VITALS — BP 126/52 | HR 49 | Ht 65.0 in | Wt 138.2 lb

## 2014-07-14 DIAGNOSIS — I519 Heart disease, unspecified: Secondary | ICD-10-CM

## 2014-07-14 DIAGNOSIS — I351 Nonrheumatic aortic (valve) insufficiency: Secondary | ICD-10-CM

## 2014-07-14 DIAGNOSIS — R001 Bradycardia, unspecified: Secondary | ICD-10-CM | POA: Diagnosis not present

## 2014-07-14 DIAGNOSIS — R072 Precordial pain: Secondary | ICD-10-CM

## 2014-07-14 DIAGNOSIS — M3320 Polymyositis, organ involvement unspecified: Secondary | ICD-10-CM

## 2014-07-14 DIAGNOSIS — I4510 Unspecified right bundle-branch block: Secondary | ICD-10-CM

## 2014-07-14 DIAGNOSIS — I5189 Other ill-defined heart diseases: Secondary | ICD-10-CM

## 2014-07-14 MED ORDER — FUROSEMIDE 40 MG PO TABS: 40.0000 mg | ORAL_TABLET | Freq: Every day | ORAL | Status: DC

## 2014-07-14 MED ORDER — POTASSIUM CHLORIDE CRYS ER 20 MEQ PO TBCR: 20.0000 meq | EXTENDED_RELEASE_TABLET | Freq: Every day | ORAL | Status: DC

## 2014-07-14 NOTE — Assessment & Plan Note (Signed)
He has trivial aortic insufficiency. This was documented by his last echo in 2015. No further workup at this time.

## 2014-07-14 NOTE — Assessment & Plan Note (Signed)
There is a history of old right bundle branch block. No further workup.

## 2014-07-14 NOTE — Assessment & Plan Note (Signed)
He has had some diastolic dysfunction and retained some fluid. He has been very stable on his diuretics for many years.

## 2014-07-14 NOTE — Assessment & Plan Note (Signed)
When the diagnosis of polymyositis was made in the past, he received treatment and improved greatly. His treatment has continued over time successfully.

## 2014-07-14 NOTE — Progress Notes (Signed)
Cardiology Office Note   Date:  07/14/2014   ID:  [name] [name], DOB [birth year], MRN [id number]  PCP:  Otilio Miu, MD  Cardiologist:  Dola Argyle, MD   Chief Complaint  Patient presents with  . Appointment    Follow-up shortness of breath      History of Present Illness: [name] [name] is a 81 y.o. male who presents today to follow-up his overall cardiac status. I have followed him for many years. In the past he had shortness of breath that was difficult to explain. Ultimately a diagnosis of polymyositis was made explaining his symptoms. He has been treated over the years by Dr. Jannifer [facility] of neurology. He has done well. He does have some diastolic dysfunction. His volume status has been managed well with diuretics. He continues to do well.  I explained to him today that I will be retiring at the end of September, 2016. He lives in [place]. We have decided that he will follow with Dr. Rockey Situ in the [place] office.   Past Medical History  Diagnosis Date  . CLL (chronic lymphoblastic leukemia) 2006    Rituxan treatment 2006  . Atrial septal aneurysm 2009    Insignificant, no, January, 2009  . Infection of PEG site     cellulitis.Marland KitchenResolved  . Vertebral compression fracture     multiple levels  . Herpes zoster     right chest  . Aortic insufficiency 2009    mild, Mild, echo, 2012  . Diastolic dysfunction     mild  . Chest pain     EF 55%, echo, October, 2009, possible inferior and posterior borderline hypokinesis, patient has never  had cardiac catheterization  . Edema     EF 55%, echo, October, 2009, possible borderline inferior and posterior hypokinesis... heart catheterization has never been done(July 10, 2009)  . Myositis     Caused pulmonary insufficiency with muscle weakness in the past / Dr.Willis-neurology, Imuran and prednisone  . Calculus of kidney   . Inguinal hernia without mention of obstruction or gangrene, unilateral or unspecified, (not specified as  recurrent)     right   . Unspecified gastritis and gastroduodenitis without mention of hemorrhage   . Abdominal pain, epigastric   . Nonspecific elevation of levels of transaminase or lactic acid dehydrogenase (LDH)   . Feeding difficulties and mismanagement   . Edema of male genital organs     Resolved, occurred during illness related to myositis  . Swelling of arm     left.Marland KitchenMarland KitchenResolved.... no DVT  . LFT elevation     Related to Imuran in the past... dose was adjusted  . Dyslipidemia     statins not used due to LFT abnormalities  . Shortness of breath     Breathing abnormalities related to muscle weakness from myositis  . RBBB (right bundle branch block)     Old  . Bradycardia     Sinus bradycardia, old  . Ejection fraction     EF 60%, echo, October, 2012  . Mitral regurgitation     Mild / moderate, echo, 2012  . Right ventricular dysfunction     Mild, echo, 2012  . Febrile illness     May, 2013, question sinusitis  . Polymyositis 11/26/2012    Past Surgical History  Procedure Laterality Date  . Splenectomy    . Hernia repair      left side  . Peg placement  06/2007  . Peg tube removal  12/2007  . Kyphosis surgery  multilevel vertebral  . Odontoid fracture surgery      anterior screw fixation  . Nasal sinus surgery      x2  . Cataract extraction      bi lateral    Patient Active Problem List   Diagnosis Date Noted  . RBBB (right bundle branch block)     Priority: High  . Bradycardia     Priority: High  . Ejection fraction     Priority: High  . Aortic insufficiency     Priority: High  . Right ventricular dysfunction     Priority: High  . Mitral regurgitation     Priority: High  . Diastolic dysfunction     Priority: High  . Chest pain     Priority: High  . Edema     Priority: High  . Dyslipidemia     Priority: High  . Shortness of breath     Priority: High  . Hypoxia 11/01/2013  . Acute bronchitis 11/01/2013  . Polymyositis 11/26/2012  .  Febrile illness   . CLL (chronic lymphoblastic leukemia)   . Atrial septal aneurysm   . Vertebral compression fracture   . Herpes zoster   . Myositis   . HYPOKALEMIA 01/19/2009  . CLL 08/01/2008  . HYPERLIPIDEMIA-MIXED 07/22/2008  . ANEMIA 07/22/2008  . NEPHROLITHIASIS 07/22/2008  . EDEMA OF MALE GENITAL ORGANS 07/22/2008  . MYOSITIS 07/22/2008  . OSTEOPOROSIS 07/22/2008  . GASTRITIS 06/30/2008  . INGUINAL HERNIA, RIGHT 06/30/2008  . ABDOMINAL PAIN, EPIGASTRIC 05/29/2008  . FEEDING PROBLEM 12/24/2007  . ABNORMAL TRANSAMINASE-LFT'S 12/24/2007  . DYSPNEA 05/22/2007      Current Outpatient Prescriptions  Medication Sig Dispense Refill  . aspirin 81 MG tablet Take 81 mg by mouth daily.    . calcium-vitamin D (CALCIUM 500+D) 500-200 MG-UNIT per tablet Take 1 tablet by mouth 2 (two) times daily.      . Cholecalciferol (VITAMIN D3) 1000 UNITS tablet Take 1,000 Units by mouth daily.      . citalopram (CELEXA) 40 MG tablet Take 40 mg by mouth daily.     . cyanocobalamin (,VITAMIN B-12,) 1000 MCG/ML injection Inject 1,000 mcg into the muscle every 30 (thirty) days. At the first of the month    . ferrous sulfate 325 (65 FE) MG tablet Take 325 mg by mouth daily with breakfast.      . fish oil-omega-3 fatty acids 1000 MG capsule Take 1 g by mouth daily.     . furosemide (LASIX) 40 MG tablet Take 1 tablet (40 mg total) by mouth daily. 90 tablet 3  . HYDROcodone-acetaminophen (NORCO/VICODIN) 5-325 MG per tablet Take 1 tablet by mouth every 6 (six) hours as needed for pain.    . Multiple Vitamin (MULTIVITAMIN) capsule Take 1 capsule by mouth daily.      . mycophenolate (CELLCEPT) 500 MG tablet Take 1 tablet (500 mg total) by mouth 2 (two) times daily. 180 tablet 3  . nystatin cream (MYCOSTATIN)   0  . potassium chloride SA (KLOR-CON M20) 20 MEQ tablet Take 1 tablet (20 mEq total) by mouth daily. Take one tablet by mouth once daily. Take an extra tablet twice a week. 114 tablet 3  . Resource  Beneprotein PACK Take 3g three times a day    . temazepam (RESTORIL) 30 MG capsule Take 30 mg by mouth at bedtime.      . triamcinolone cream (KENALOG) 0.1 %   0  . vitamin B-12 (CYANOCOBALAMIN) 1000 MCG tablet Take 1,000 mcg  by mouth daily.       No current facility-administered medications for this visit.    Allergies:   Sulfonamide derivatives    Social History:  The patient  reports that he quit smoking about 34 years ago. His smoking use included Cigarettes. He has a 15.5 pack-year smoking history. He has never used smokeless tobacco. He reports that he drinks alcohol. He reports that he does not use illicit drugs.   Family History:  The patient's family history includes Breast cancer in his sister; Cancer in his brother, daughter, and sister; Colon cancer in his brother; Diabetes in his grandchild; Emphysema in his father; Hypertension in his mother; Prostate cancer in his brother; Stroke in his mother. There is no history of Heart attack.    ROS:  Please see the history of present illness.    Patient denies fever, chills, headache, sweats, rash, change in vision, change in hearing, chest pain, cough, nausea or vomiting, urinary symptoms. All other systems are reviewed and are negative.    PHYSICAL EXAM: VS:  BP 126/52 mmHg  Pulse 49  Ht 5\' 5"  (1.651 m)  Wt 138 lb 3.2 oz (62.687 kg)  BMI 23.00 kg/m2  SpO2 97% , Patient is oriented to person time and place. Affect is normal. He has kyphosis of the thoracic spine. Head is atraumatic. Sclera and conjunctiva are normal. There is no jugular venous distention. Lungs reveal a few scattered rhonchi. Cardiac exam reveals an S1 and S2. The abdomen is soft. There is no peripheral edema. There are no musculoskeletal deformities other than his kyphosis. There are no skin rashes. Neurologic is grossly intact.  EKG:  EKG is not done today.    Recent Labs: 11/01/2013: Pro B Natriuretic peptide (BNP) 283.7 06/17/2014: ALT 13; BUN 18; Creatinine  0.83; Hemoglobin 12.0*; Platelets 180; Potassium 4.8; Sodium 140    Lipid Panel No results found for: CHOL, TRIG, HDL, CHOLHDL, VLDL, LDLCALC, LDLDIRECT    Wt Readings from Last 3 Encounters:  07/14/14 138 lb 3.2 oz (62.687 kg)  06/17/14 138 lb 12.8 oz (62.959 kg)  12/17/13 127 lb (57.607 kg)      Current medicines are reviewed  The patient understands his medications.     ASSESSMENT AND PLAN:

## 2014-07-14 NOTE — Patient Instructions (Addendum)
Your physician recommends that you continue on your current medications as directed. Please refer to the Current Medication list given to you today.  Your physician wants you to follow-up in: 6 months in the [place] office with Dr Rockey Situ. You will receive a reminder letter in the mail two months in advance. If you don't receive a letter, please call our office to schedule the follow-up appointment.  The phone number to reach the [facility] is: [phone]

## 2014-07-14 NOTE — Assessment & Plan Note (Signed)
In the remote past he had some chest pain. At the time of his echo in 2009, there was question of possible borderline hypokinesis of the inferior and posterior walls. This was not seen on the echo of 2015. Cardiac catheterization has never been done. He is not having any significant symptoms. No further workup.

## 2014-07-14 NOTE — Assessment & Plan Note (Addendum)
There has been suggestion of right ventricular dysfunction over the years. The echoes have been technically difficult. He has not had documented significant pulmonary hypertension. I have chosen over time to not his right heart any further.

## 2014-07-14 NOTE — Assessment & Plan Note (Signed)
He has a history of mild sinus bradycardia. This has not been symptomatic. No further workup.

## 2014-07-14 NOTE — Assessment & Plan Note (Signed)
There is additional diagnosis of CLL. He receives treatment for this.  As outlined the patient has many medical problems. His overall cardiac status remains stable and no further workup is needed at this time. We will arrange for him to be followed in the future in the [place] office by Dr. Rockey Situ

## 2014-08-21 ENCOUNTER — Ambulatory Visit
Admission: RE | Admit: 2014-08-21 | Discharge: 2014-08-21 | Disposition: A | Discharge status: Home / Self Care | Payer: Medicare Other | Source: Ambulatory Visit | Attending: Family Medicine | Admitting: Family Medicine

## 2014-08-21 ENCOUNTER — Other Ambulatory Visit: Payer: Self-pay | Admitting: Family Medicine

## 2014-08-21 DIAGNOSIS — R05 Cough: Secondary | ICD-10-CM | POA: Diagnosis not present

## 2014-08-21 DIAGNOSIS — R053 Chronic cough: Secondary | ICD-10-CM

## 2014-08-21 DIAGNOSIS — R0989 Other specified symptoms and signs involving the circulatory and respiratory systems: Secondary | ICD-10-CM

## 2014-08-26 ENCOUNTER — Other Ambulatory Visit: Payer: Self-pay | Admitting: *Deleted

## 2014-08-26 DIAGNOSIS — C9110 Chronic lymphocytic leukemia of B-cell type not having achieved remission: Secondary | ICD-10-CM

## 2014-08-27 ENCOUNTER — Inpatient Hospital Stay: Payer: Medicare Other | Attending: Oncology | Admitting: Oncology

## 2014-08-27 ENCOUNTER — Inpatient Hospital Stay: Payer: Medicare Other

## 2014-08-27 ENCOUNTER — Encounter: Payer: Self-pay | Admitting: Oncology

## 2014-08-27 VITALS — HR 82 | Temp 96.1°F | Wt 131.2 lb

## 2014-08-27 DIAGNOSIS — Z803 Family history of malignant neoplasm of breast: Secondary | ICD-10-CM | POA: Diagnosis not present

## 2014-08-27 DIAGNOSIS — M51379 Other intervertebral disc degeneration, lumbosacral region without mention of lumbar back pain or lower extremity pain: Secondary | ICD-10-CM | POA: Insufficient documentation

## 2014-08-27 DIAGNOSIS — C9110 Chronic lymphocytic leukemia of B-cell type not having achieved remission: Secondary | ICD-10-CM | POA: Diagnosis not present

## 2014-08-27 DIAGNOSIS — I340 Nonrheumatic mitral (valve) insufficiency: Secondary | ICD-10-CM | POA: Diagnosis not present

## 2014-08-27 DIAGNOSIS — F17210 Nicotine dependence, cigarettes, uncomplicated: Secondary | ICD-10-CM | POA: Diagnosis not present

## 2014-08-27 DIAGNOSIS — Z800 Family history of malignant neoplasm of digestive organs: Secondary | ICD-10-CM

## 2014-08-27 DIAGNOSIS — Z87898 Personal history of other specified conditions: Secondary | ICD-10-CM | POA: Insufficient documentation

## 2014-08-27 DIAGNOSIS — M5137 Other intervertebral disc degeneration, lumbosacral region: Secondary | ICD-10-CM | POA: Insufficient documentation

## 2014-08-27 DIAGNOSIS — R509 Fever, unspecified: Secondary | ICD-10-CM

## 2014-08-27 DIAGNOSIS — R531 Weakness: Secondary | ICD-10-CM | POA: Insufficient documentation

## 2014-08-27 DIAGNOSIS — E538 Deficiency of other specified B group vitamins: Secondary | ICD-10-CM | POA: Diagnosis not present

## 2014-08-27 DIAGNOSIS — M791 Myalgia, unspecified site: Secondary | ICD-10-CM | POA: Insufficient documentation

## 2014-08-27 DIAGNOSIS — Z1211 Encounter for screening for malignant neoplasm of colon: Secondary | ICD-10-CM | POA: Insufficient documentation

## 2014-08-27 DIAGNOSIS — I7300 Raynaud's syndrome without gangrene: Secondary | ICD-10-CM | POA: Insufficient documentation

## 2014-08-27 DIAGNOSIS — M419 Scoliosis, unspecified: Secondary | ICD-10-CM | POA: Insufficient documentation

## 2014-08-27 DIAGNOSIS — J209 Acute bronchitis, unspecified: Secondary | ICD-10-CM | POA: Diagnosis not present

## 2014-08-27 LAB — CBC WITH DIFFERENTIAL/PLATELET
BASOS ABS: 0 10*3/uL (ref 0.0–0.1)
Band Neutrophils: 1 % (ref 0–10)
Basophils Relative: 0 % (ref 0–1)
Blasts: 0 %
Eosinophils Absolute: 0 10*3/uL (ref 0.0–0.7)
Eosinophils Relative: 0 % (ref 0–5)
HCT: 36.8 % — ABNORMAL LOW (ref 40.0–52.0)
Hemoglobin: 11.4 g/dL — ABNORMAL LOW (ref 13.0–18.0)
LYMPHS PCT: 72 % — AB (ref 12–46)
Lymphs Abs: 21.9 10*3/uL — ABNORMAL HIGH (ref 0.7–4.0)
MCH: 33.2 pg (ref 26.0–34.0)
MCHC: 30.9 g/dL — ABNORMAL LOW (ref 32.0–36.0)
MCV: 107.4 fL — ABNORMAL HIGH (ref 80.0–100.0)
MONOS PCT: 9 % (ref 3–12)
Metamyelocytes Relative: 2 %
Monocytes Absolute: 2.7 10*3/uL — ABNORMAL HIGH (ref 0.1–1.0)
Myelocytes: 2 %
NEUTROS ABS: 5.8 10*3/uL (ref 1.7–7.7)
NEUTROS PCT: 14 % — AB (ref 43–77)
Other: 0 %
PLATELETS: 228 10*3/uL (ref 150–440)
PROMYELOCYTES ABS: 0 %
RBC: 3.43 MIL/uL — ABNORMAL LOW (ref 4.40–5.90)
RDW: 16.8 % — AB (ref 11.5–14.5)
WBC: 30.4 10*3/uL — AB (ref 3.8–10.6)
nRBC: 0 /100 WBC

## 2014-08-27 NOTE — Progress Notes (Signed)
[place] @ [facility] Telephone:([phone]) [phone]  Fax:(336) [phone]     [name] [name] OB: Feb 05, 1930  MR#: [id number]  IWL#:[id number]  Patient Care Team: Juline Patch, MD as PCP - General (Family Medicine) Kathrynn Ducking, MD (Neurology) Forest Gleason, MD as Consulting Physician (Unknown Physician Specialty) Carlena Bjornstad, MD as Consulting Physician (Cardiology)  CHIEF COMPLAINT:  Chief Complaint  Patient presents with  . Follow-up      Chronic lymphocytic leukemia   08/01/2008 Initial Diagnosis Chronic lymphocytic leukemia    Oncology Flowsheet 11/02/2013 11/03/2013 11/04/2013  methylPREDNISolone sodium succinate 40 mg/mL (SOLU-MEDROL) IV 40 mg 40 mg 40 mg    INTERVAL HISTORY: 81 year old gentleman with chronic lymphocytic leukemia and hemolytic anemia previously treated with rituximab.  Patient presented to primary care physician with acute onset of cough and shortness of breath.  Patient has been treated with a course of prednisone, albuterol inhaler, singular and has noticed some improvement but continues to play weak and tired.  Sinus congestion persists.  REVIEW OF SYSTEMS:   General condition patient is feeling weak tired and low-grade fever which is improved HEENT no headache no dizziness.  Lungs had cough without any expectoration.  Shortness of breath.  Cardiac: No chest pain.  GI: No nausea no vomiting no diarrhea GU no dysuria hematuria.  Lower extremity no swelling skin: No rash.  Neurological system no headache no dizziness.  All other systems have been reviewed.  And they are negative  As per HPI. Otherwise, a complete review of systems is negatve.  PAST MEDICAL HISTORY: Past Medical History  Diagnosis Date  . CLL (chronic lymphoblastic leukemia) 2006    Rituxan treatment 2006  . Atrial septal aneurysm 2009    Insignificant, no, January, 2009  . Infection of PEG site     cellulitis.Marland KitchenResolved  . Vertebral compression fracture     multiple levels  . Herpes  zoster     right chest  . Aortic insufficiency 2009    mild, Mild, echo, 2012  . Diastolic dysfunction     mild  . Chest pain     EF 55%, echo, October, 2009, possible inferior and posterior borderline hypokinesis, patient has never  had cardiac catheterization  . Edema     EF 55%, echo, October, 2009, possible borderline inferior and posterior hypokinesis... heart catheterization has never been done(July 10, 2009)  . Myositis     Caused pulmonary insufficiency with muscle weakness in the past / Dr.Willis-neurology, Imuran and prednisone  . Calculus of kidney   . Inguinal hernia without mention of obstruction or gangrene, unilateral or unspecified, (not specified as recurrent)     right   . Unspecified gastritis and gastroduodenitis without mention of hemorrhage   . Abdominal pain, epigastric   . Nonspecific elevation of levels of transaminase or lactic acid dehydrogenase (LDH)   . Feeding difficulties and mismanagement   . Edema of male genital organs     Resolved, occurred during illness related to myositis  . Swelling of arm     left.Marland KitchenMarland KitchenResolved.... no DVT  . LFT elevation     Related to Imuran in the past... dose was adjusted  . Dyslipidemia     statins not used due to LFT abnormalities  . Shortness of breath     Breathing abnormalities related to muscle weakness from myositis  . RBBB (right bundle branch block)     Old  . Bradycardia     Sinus bradycardia, old  . Ejection fraction  EF 60%, echo, October, 2012  . Mitral regurgitation     Mild / moderate, echo, 2012  . Right ventricular dysfunction     Mild, echo, 2012  . Febrile illness     May, 2013, question sinusitis  . Polymyositis 11/26/2012    PAST SURGICAL HISTORY: Past Surgical History  Procedure Laterality Date  . Splenectomy    . Hernia repair      left side  . Peg placement  06/2007  . Peg tube removal  12/2007  . Kyphosis surgery      multilevel vertebral  . Odontoid fracture surgery      anterior  screw fixation  . Nasal sinus surgery      x2  . Cataract extraction      bi lateral    FAMILY HISTORY Family History  Problem Relation Age of Onset  . Breast cancer Sister   . Colon cancer Brother   . Prostate cancer Brother   . Stroke Mother   . Hypertension Mother   . Emphysema Father   . Diabetes Grandchild   . Heart attack Neg Hx   . Cancer Brother   . Cancer Sister   . Cancer Daughter     GYNECOLOGIC HISTORY:  No LMP for male patient.     ADVANCED DIRECTIVES:    HEALTH MAINTENANCE: History  Substance Use Topics  . Smoking status: Former Smoker -- 0.50 packs/day for 31 years    Types: Cigarettes    Quit date: 06/29/1980  . Smokeless tobacco: Never Used  . Alcohol Use: Yes     Comment: rarely      Allergies  Allergen Reactions  . Sulfonamide Derivatives     Current Outpatient Prescriptions  Medication Sig Dispense Refill  . amoxicillin-clavulanate (AUGMENTIN) 875-125 MG per tablet Take 1 tablet by mouth 2 (two) times daily.  0  . aspirin 81 MG tablet Take 81 mg by mouth daily.    . calcium-vitamin D (CALCIUM 500+D) 500-200 MG-UNIT per tablet Take 1 tablet by mouth 2 (two) times daily.      . Cholecalciferol (VITAMIN D3) 1000 UNITS tablet Take 1,000 Units by mouth daily.      . citalopram (CELEXA) 40 MG tablet Take 40 mg by mouth daily.     . cyanocobalamin (,VITAMIN B-12,) 1000 MCG/ML injection Inject 1,000 mcg into the muscle every 30 (thirty) days. At the first of the month    . ferrous sulfate 325 (65 FE) MG tablet Take 325 mg by mouth daily with breakfast.      . fish oil-omega-3 fatty acids 1000 MG capsule Take 1 g by mouth daily.     . fluticasone (FLONASE) 50 MCG/ACT nasal spray   3  . furosemide (LASIX) 40 MG tablet Take 1 tablet (40 mg total) by mouth daily. 90 tablet 3  . HYDROcodone-acetaminophen (NORCO/VICODIN) 5-325 MG per tablet Take 1 tablet by mouth every 6 (six) hours as needed for pain.    . montelukast (SINGULAIR) 10 MG tablet Take 10  mg by mouth daily.  11  . Multiple Vitamin (MULTIVITAMIN) capsule Take 1 capsule by mouth daily.      . mycophenolate (CELLCEPT) 500 MG tablet Take 1 tablet (500 mg total) by mouth 2 (two) times daily. 180 tablet 3  . nystatin cream (MYCOSTATIN)   0  . potassium chloride SA (KLOR-CON M20) 20 MEQ tablet Take 1 tablet (20 mEq total) by mouth daily. Take one tablet by mouth once daily. Take an extra tablet  twice a week. 114 tablet 3  . PROAIR HFA 108 (90 BASE) MCG/ACT inhaler   11  . Resource Beneprotein PACK Take 3g three times a day    . temazepam (RESTORIL) 30 MG capsule Take 30 mg by mouth at bedtime.      . triamcinolone cream (KENALOG) 0.1 %   0  . vitamin B-12 (CYANOCOBALAMIN) 1000 MCG tablet Take 1,000 mcg by mouth daily.       No current facility-administered medications for this visit.    OBJECTIVE:  Filed Vitals:   08/27/14 1052  Pulse: 82  Temp: 96.1 F (35.6 C)     Body mass index is 21.83 kg/(m^2).    ECOG FS:1 - Symptomatic but completely ambulatory    PHYSICAL EXAM: Gen. status: Patient is alert oriented not any acute distress.Head exam was generally normal. There was no scleral icterus or corneal arcus. Mucous membranes were moist.  Lungs: Occasional rhonchi emphysematous chest because of kyphoscoliosis limited at entry cardiac: Tachycardia is soft systolic murmurAbdominal exam revealed normal bowel sounds. The abdomen was soft, non-tender, and without masses, organomegaly, or appreciable enlargement of the abdominal aorta.Neurologically, the patient was awake, alert, and oriented to person, place and time. There were no obvious focal neurologic abnormalities.  Lower extremity no edemaExamination of the skin revealed no evidence of significant rashes, suspicious appearing nevi or other concerning lesions.. All other systems have been examined   LAB RESULTS:  Appointment on 08/27/2014  Component Date Value Ref Range Status  . WBC 08/27/2014 30.4* 3.8 - 10.6 K/uL Final    . RBC 08/27/2014 3.43* 4.40 - 5.90 MIL/uL Final  . Hemoglobin 08/27/2014 11.4* 13.0 - 18.0 g/dL Final  . HCT 08/27/2014 36.8* 40.0 - 52.0 % Final  . MCV 08/27/2014 107.4* 80.0 - 100.0 fL Final  . MCH 08/27/2014 33.2  26.0 - 34.0 pg Final  . MCHC 08/27/2014 30.9* 32.0 - 36.0 g/dL Final  . RDW 08/27/2014 16.8* 11.5 - 14.5 % Final  . Platelets 08/27/2014 228  150 - 440 K/uL Final  . Neutrophils Relative % 08/27/2014 PENDING  43 - 77 % Incomplete  . Neutro Abs 08/27/2014 PENDING  1.7 - 7.7 K/uL Incomplete  . Band Neutrophils 08/27/2014 PENDING  0 - 10 % Incomplete  . Lymphocytes Relative 08/27/2014 PENDING  12 - 46 % Incomplete  . Lymphs Abs 08/27/2014 PENDING  0.7 - 4.0 K/uL Incomplete  . Monocytes Relative 08/27/2014 PENDING  3 - 12 % Incomplete  . Monocytes Absolute 08/27/2014 PENDING  0.1 - 1.0 K/uL Incomplete  . Eosinophils Relative 08/27/2014 PENDING  0 - 5 % Incomplete  . Eosinophils Absolute 08/27/2014 PENDING  0.0 - 0.7 K/uL Incomplete  . Basophils Relative 08/27/2014 PENDING  0 - 1 % Incomplete  . Basophils Absolute 08/27/2014 PENDING  0.0 - 0.1 K/uL Incomplete  . WBC Morphology 08/27/2014 PENDING   Incomplete  . RBC Morphology 08/27/2014 PENDING   Incomplete  . Smear Review 08/27/2014 PENDING   Incomplete  . Other 08/27/2014 PENDING   Incomplete  . nRBC 08/27/2014 PENDING  0 /100 WBC Incomplete  . Metamyelocytes Relative 08/27/2014 PENDING   Incomplete  . Myelocytes 08/27/2014 PENDING   Incomplete  . Promyelocytes Absolute 08/27/2014 PENDING   Incomplete  . Blasts 08/27/2014 PENDING   Incomplete    No results found for: LABCA2 No results found for: CA199 No results found for: CEA No results found for: PSA No results found for: CA125   STUDIES: Dg Chest 2 View  08/21/2014  CLINICAL DATA:  Productive cough. Shortness of breath. Low-grade fever.  EXAM: CHEST  2 VIEW  COMPARISON:  CT 11/13/2013.  FINDINGS: Mediastinum and hilar structures are normal. The lungs are clear.  Interim clearing of previously identified pneumonia noted on CT of 11/13/2013 . Mild cardiomegaly with normal pulmonary vascularity. Bilateral pleural parenchymal thickening noted consistent with scarring. Diffuse severe osteopenia with multiple compression fractures and vertebroplasties.  IMPRESSION: No acute cardiopulmonary disease.   Electronically Signed   By: Marcello Moores  Register   On: 08/21/2014 11:04    ASSESSMENT: Chronic lymphocytic leukemia may be slowly progressing however hemoglobin today is 11.2.  White count is 30,000. Acute bronchitis Kyphoscoliosis   MEDICAL DECISION MAKING:  All lab data from Dr. Ronnald Ramp office as well as here at been reviewed.  Hemoglobin remains around 11 g and stable.  Patient would not require any CLL directed therapy at present time.  We will repeat hemoglobin in 3 weeks Indication for treatment of CLL would be hemoglobin below 10 g.  As patient usually becomes symptomatic.  Previously treated with rituximab. 2 acute bronchitis is gradually resolving 3.  Leukocytosis and lymphocytosis no need for further treatment 4.  Vitamin B12 deficiency patient is taking B12 shots at home  Patient expressed understanding and was in agreement with this plan. He also understands that He can call clinic at any time with any questions, concerns, or complaints.    No matching staging information was found for the patient.  Forest Gleason, MD   08/27/2014 1:25 PM

## 2014-09-15 ENCOUNTER — Other Ambulatory Visit: Payer: Medicare Other

## 2014-09-15 ENCOUNTER — Ambulatory Visit: Payer: Medicare Other | Admitting: Oncology

## 2014-09-16 ENCOUNTER — Telehealth: Payer: Self-pay | Admitting: Neurology

## 2014-09-16 NOTE — Telephone Encounter (Signed)
I called the patient's daughter. She stated that the patient hasn't been feeling well for the last 6-7 weeks. They thought it was an infection. He was placed on antibiotics by his PCP but that did not help. He was also given a Prednisone taper that did not help either. He was coughing and had some trouble breathing and saw another doctor for this. He was given inhalers. His WBC is elevated, per the patient's daughter. He has no energy and doesn't even feel like going to church, which is really important to him. His daughter would like him to come in to see Dr. Jannifer Franklin. I scheduled appointment for 6/10. The patient is going to his oncologist, Dr. Oliva Bustard, to have labs drawn tomorrow. His daughter requested if there is anything you would like checked to order it to be done then, if possible.

## 2014-09-16 NOTE — Telephone Encounter (Signed)
Patient's daughter is calling as her Dad is very sick and she feels it may be related to his Polymyositis  and needs to see Dr. Jannifer Franklin.  Please call if you can work patient in.  Thanks!

## 2014-09-17 ENCOUNTER — Inpatient Hospital Stay: Payer: Medicare Other | Attending: Oncology

## 2014-09-17 DIAGNOSIS — C9110 Chronic lymphocytic leukemia of B-cell type not having achieved remission: Secondary | ICD-10-CM | POA: Diagnosis present

## 2014-09-17 LAB — CBC WITH DIFFERENTIAL/PLATELET
Basophils Absolute: 0.1 10*3/uL (ref 0–0.1)
Eosinophils Absolute: 0.1 10*3/uL (ref 0–0.7)
HEMATOCRIT: 33.1 % — AB (ref 40.0–52.0)
Hemoglobin: 10.6 g/dL — ABNORMAL LOW (ref 13.0–18.0)
LYMPHS ABS: 22.6 10*3/uL — AB (ref 1.0–3.6)
Lymphocytes Relative: 83 %
MCH: 34.7 pg — ABNORMAL HIGH (ref 26.0–34.0)
MCHC: 32 g/dL (ref 32.0–36.0)
MCV: 108.5 fL — AB (ref 80.0–100.0)
Monocytes Absolute: 2 10*3/uL — ABNORMAL HIGH (ref 0.2–1.0)
Monocytes Relative: 7 %
Neutro Abs: 2.9 10*3/uL (ref 1.4–6.5)
Platelets: 225 10*3/uL (ref 150–440)
RBC: 3.05 MIL/uL — ABNORMAL LOW (ref 4.40–5.90)
RDW: 16.9 % — ABNORMAL HIGH (ref 11.5–14.5)
WBC: 27.7 10*3/uL — ABNORMAL HIGH (ref 3.8–10.6)

## 2014-09-17 NOTE — Telephone Encounter (Signed)
Manuela Schwartz, pt's daughter called/returning Kelby's call. She is requesting to speak with [facility]. Please call and advise. Manuela Schwartz can be reached@  [phone]

## 2014-09-17 NOTE — Telephone Encounter (Signed)
Patients daughter returned the call and requested you call her back. [phone]).

## 2014-09-17 NOTE — Telephone Encounter (Signed)
I called the patient's daughters back and talked to [place]. Unfortunately, the appointment for tomorrow was already taken. They will keep their appointment on Friday 6/10.

## 2014-09-17 NOTE — Telephone Encounter (Signed)
I called and left a message for Manuela Schwartz and Juliann Pulse that I have an available appointment tomorrow afternoon. I asked that they call me back.

## 2014-09-19 ENCOUNTER — Encounter: Payer: Self-pay | Admitting: Neurology

## 2014-09-19 ENCOUNTER — Ambulatory Visit (INDEPENDENT_AMBULATORY_CARE_PROVIDER_SITE_OTHER): Payer: Medicare Other | Admitting: Neurology

## 2014-09-19 VITALS — BP 147/55 | HR 51 | Ht 65.0 in | Wt 129.6 lb

## 2014-09-19 DIAGNOSIS — R5381 Other malaise: Secondary | ICD-10-CM

## 2014-09-19 DIAGNOSIS — M3320 Polymyositis, organ involvement unspecified: Secondary | ICD-10-CM

## 2014-09-19 DIAGNOSIS — R5383 Other fatigue: Secondary | ICD-10-CM | POA: Diagnosis not present

## 2014-09-19 MED ORDER — MYCOPHENOLATE MOFETIL 500 MG PO TABS: 500.0000 mg | ORAL_TABLET | Freq: Every day | ORAL | Status: DC

## 2014-09-19 NOTE — Progress Notes (Signed)
Reason for visit: Polymyositis  [name] [name] is an 81 y.o. male  History of present illness:  [name] [name] is an 81 year old right-handed white male with a history of polymyositis that has been roughly stable over a number of years. The patient has sustained a viral illness that began at the end of April 2016. The patient was quite ill in May 2016. He believes that he is now recovering from the illness. He developed a cough, fever, and he has had persistent fatigue issues. He denies any actual muscle weakness per se. The patient is no longer running fevers. He has had some recent blood work done that continues show elevation of white blood count associated with his chronic lymphocytic anemia. He remains on CellCept taking 500 mg twice daily. He denies any issues with chewing or swallowing. He does still have some shortness of breath with minimal exertion. He will cough on occasion.  Past Medical History  Diagnosis Date  . CLL (chronic lymphoblastic leukemia) 2006    Rituxan treatment 2006  . Atrial septal aneurysm 2009    Insignificant, no, January, 2009  . Infection of PEG site     cellulitis.Marland KitchenResolved  . Vertebral compression fracture     multiple levels  . Herpes zoster     right chest  . Aortic insufficiency 2009    mild, Mild, echo, 2012  . Diastolic dysfunction     mild  . Chest pain     EF 55%, echo, October, 2009, possible inferior and posterior borderline hypokinesis, patient has never  had cardiac catheterization  . Edema     EF 55%, echo, October, 2009, possible borderline inferior and posterior hypokinesis... heart catheterization has never been done(July 10, 2009)  . Myositis     Caused pulmonary insufficiency with muscle weakness in the past / Dr.Willis-neurology, Imuran and prednisone  . Calculus of kidney   . Inguinal hernia without mention of obstruction or gangrene, unilateral or unspecified, (not specified as recurrent)     right   . Unspecified gastritis and  gastroduodenitis without mention of hemorrhage   . Abdominal pain, epigastric   . Nonspecific elevation of levels of transaminase or lactic acid dehydrogenase (LDH)   . Feeding difficulties and mismanagement   . Edema of male genital organs     Resolved, occurred during illness related to myositis  . Swelling of arm     left.Marland KitchenMarland KitchenResolved.... no DVT  . LFT elevation     Related to Imuran in the past... dose was adjusted  . Dyslipidemia     statins not used due to LFT abnormalities  . Shortness of breath     Breathing abnormalities related to muscle weakness from myositis  . RBBB (right bundle branch block)     Old  . Bradycardia     Sinus bradycardia, old  . Ejection fraction     EF 60%, echo, October, 2012  . Mitral regurgitation     Mild / moderate, echo, 2012  . Right ventricular dysfunction     Mild, echo, 2012  . Febrile illness     May, 2013, question sinusitis  . Polymyositis 11/26/2012    Past Surgical History  Procedure Laterality Date  . Splenectomy    . Hernia repair      left side  . Peg placement  06/2007  . Peg tube removal  12/2007  . Kyphosis surgery      multilevel vertebral  . Odontoid fracture surgery      anterior  screw fixation  . Nasal sinus surgery      x2  . Cataract extraction      bi lateral    Family History  Problem Relation Age of Onset  . Breast cancer Sister   . Colon cancer Brother   . Prostate cancer Brother   . Stroke Mother   . Hypertension Mother   . Emphysema Father   . Diabetes Grandchild   . Heart attack Neg Hx   . Cancer Brother   . Cancer Sister   . Cancer Daughter     Social history:  reports that he quit smoking about 34 years ago. His smoking use included Cigarettes. He has a 15.5 pack-year smoking history. He has never used smokeless tobacco. He reports that he does not drink alcohol or use illicit drugs.    Allergies  Allergen Reactions  . Sulfonamide Derivatives     Medications:  Prior to Admission  medications   Medication Sig Start Date End Date Taking? Authorizing Provider  aspirin 81 MG tablet Take 81 mg by mouth daily.   Yes Historical Provider, MD  budesonide (PULMICORT) 180 MCG/ACT inhaler Inhale 1 puff into the lungs 2 (two) times daily.   Yes Historical Provider, MD  calcium-vitamin D (CALCIUM 500+D) 500-200 MG-UNIT per tablet Take 1 tablet by mouth 2 (two) times daily.     Yes Historical Provider, MD  Cholecalciferol (VITAMIN D3) 1000 UNITS tablet Take 1,000 Units by mouth daily.     Yes Historical Provider, MD  citalopram (CELEXA) 40 MG tablet Take 40 mg by mouth daily.  10/23/11  Yes Historical Provider, MD  cyanocobalamin (,VITAMIN B-12,) 1000 MCG/ML injection Inject 1,000 mcg into the muscle every 30 (thirty) days. At the first of the month   Yes Historical Provider, MD  ferrous sulfate 325 (65 FE) MG tablet Take 325 mg by mouth daily with breakfast.     Yes Historical Provider, MD  fish oil-omega-3 fatty acids 1000 MG capsule Take 1 g by mouth daily.    Yes Historical Provider, MD  fluticasone Asencion Islam) 50 MCG/ACT nasal spray  08/14/14  Yes Historical Provider, MD  furosemide (LASIX) 40 MG tablet Take 1 tablet (40 mg total) by mouth daily. Patient taking differently: Take 80 mg by mouth daily.  07/14/14  Yes Carlena Bjornstad, MD  HYDROcodone-acetaminophen (NORCO/VICODIN) 5-325 MG per tablet Take 1 tablet by mouth every 6 (six) hours as needed for pain.   Yes Historical Provider, MD  montelukast (SINGULAIR) 10 MG tablet Take 10 mg by mouth daily. 08/21/14  Yes Historical Provider, MD  Multiple Vitamin (MULTIVITAMIN) capsule Take 1 capsule by mouth daily.     Yes Historical Provider, MD  mycophenolate (CELLCEPT) 500 MG tablet Take 1 tablet (500 mg total) by mouth daily. 09/19/14  Yes Kathrynn Ducking, MD  nystatin cream (MYCOSTATIN)  05/06/14  Yes Historical Provider, MD  potassium chloride SA (KLOR-CON M20) 20 MEQ tablet Take 1 tablet (20 mEq total) by mouth daily. Take one tablet by mouth  once daily. Take an extra tablet twice a week. 07/14/14  Yes Carlena Bjornstad, MD  PROAIR HFA 108 [phone] BASE) MCG/ACT inhaler  08/21/14  Yes Historical Provider, MD  Resource Beneprotein PACK Take 3g three times a day   Yes Historical Provider, MD  temazepam (RESTORIL) 30 MG capsule Take 30 mg by mouth at bedtime.     Yes Historical Provider, MD  triamcinolone cream (KENALOG) 0.1 %  05/06/14  Yes Historical Provider, MD  vitamin B-12 (CYANOCOBALAMIN) 1000 MCG tablet Take 1,000 mcg by mouth daily.     Yes Historical Provider, MD    ROS:  Out of a complete 14 system review of symptoms, the patient complains only of the following symptoms, and all other reviewed systems are negative.  Fatigue Cough, shortness of breath Cold intolerance Weakness  Blood pressure 147/55, pulse 51, height 5\' 5"  (1.651 m), weight 129 lb 9.6 oz (58.786 kg).  Physical Exam  General: The patient is alert and cooperative at the time of the examination.  Skin: No significant peripheral edema is noted.   Neurologic Exam  Mental status: The patient is alert and oriented x 3 at the time of the examination. The patient has apparent normal recent and remote memory, with an apparently normal attention span and concentration ability.   Cranial nerves: Facial symmetry is present. Speech is normal, no aphasia or dysarthria is noted. Extraocular movements are full. Visual fields are full.  Motor: The patient has good strength in all 4 extremities.  Sensory examination: Soft touch sensation is symmetric on the face, arms, and legs.  Coordination: The patient has good finger-nose-finger and heel-to-shin bilaterally.  Gait and station: The patient has a normal gait. Tandem gait is slightly unsteady. Romberg is negative. No drift is seen.  Reflexes: Deep tendon reflexes are symmetric.   Assessment/Plan:  1. Polymyositis  2. Chronic lymphocytic leukemia  3. Recent viral illness   The patient has had difficulty  getting over a recent viral illness. The patient has been quite stable with the polymyositis, I see no weakness today on clinical examination. We may consider reducing the CellCept as this may make it easier to recover from illnesses such as this. The patient have some further blood work done today. He will follow-up for his regular appointment on 12/18/2014.  Jill Alexanders MD 09/19/2014 6:49 PM  [facility] [address] [place], [place] [address]  Phone [phone] Fax [phone]

## 2014-09-19 NOTE — Patient Instructions (Signed)
Polymyositis Polymyositis is one type of inflammatory myopathy. Inflammatory myopathies are muscle diseases that involve redness, soreness, and swelling (inflammation) of the muscles. Polymyositis causes decreased muscle power. It affects the connective tissues of the body (connective tissue disease). It is often associated with diseases in which the body attacks its own cells (autoimmune diseases). Polymyositis begins gradually. It generally affects adults in their 50s to 53s. CAUSES  The cause is often unknown. Some cases are caused by a bacteria, virus, or parasite infection. This can trigger autoantibodies, which attack normal, healthy cells. SYMPTOMS   The most common symptom is muscle weakness. This often affects muscles close to the trunk of the body (proximal). Muscles not close to the trunk (distal) may also be affected later on in the disease.  Eventually, patients have trouble:  Rising from a sitting position.  Climbing stairs.  Lifting objects.  Reaching overhead.  Trouble swallowing (dysphagia) may occur.  Rarely, the muscles ache and are tender to touch. Associated conditions include:  Fingers, toes, nose, and ears that turn pale when exposed to cold (Raynaud's phenomenon).  Other connective tissue diseases. This may include lupus, rheumatoid arthritis, or scleroderma.  Heart disease. This is caused by inflammation of the heart muscle. DIAGNOSIS  Diagnosis can be difficult. It is based on symptoms and a thorough exam. Further testing may be done, including:  Blood tests to check for high levels of muscle enzymes, which suggest muscle damage, and specific autoantibodies.  Computerized magnetic scan (MRI).  Test to check electrical activity of the muscle (electromyography).  Exam of a muscle tissue sample (biopsy). TREATMENT  There is no cure. Treatment can improve muscle strength and function. The earlier treatment is started, the more effective it is. Early  treatment often means fewer complications.  Drug treatments may include:  Corticosteroids, especially prednisone. Patients often improve in about 2 to 4 weeks. However, drug therapy may be needed for years. Long-term use of corticosteroids can have serious side effects. Your caregiver may advise supplements or may prescribe other medicines.  Corticosteroid-sparing agents.  Intravenous immunoglobulin (IVIG). This contains healthy antibodies from blood donors.  Immunosuppressive therapies, such as tacrolimus.  Physical therapy is often advised. This helps stop muscles from wasting away (atrophy).  Chewing and swallowing can be more difficult later on in the disease. A registered dietitian can teach you how to make foods that are easy to eat.  Speech therapy can be helpful if your swallowing muscles are weakened. Results from therapy vary depending on the severity of the disease. Rarely, the disease may be life-threatening to patients with severe, progressive muscle weakness, dysphagia, malnutrition, pneumonia, or respiratory failure. HOME CARE INSTRUCTIONS   Stay active. An exercise routine can help you build and maintain muscle strength. It is important to get a detailed plan and recommendations from your caregiver or physical therapist before starting an exercise program.  Rest when you are tired. Do not wait until you are exhausted to rest. Learn to pace yourself. SEEK MEDICAL CARE IF:  You develop new or worsening muscle weakness. SEEK IMMEDIATE MEDICAL CARE IF:   You have trouble swallowing or speaking.  You develop shortness of breath. FOR MORE INFORMATION  The Myositis Association: www.myositis.[facility]: [employer] Document Released: 03/18/2002 Document Revised: 06/20/2011 Document Reviewed: 07/30/2009 [facility] Patient Information 2015 [place], [place]. This information is not intended to replace advice given to you by  your health care provider. Make sure you discuss any questions you have with your health  care provider.  

## 2014-09-20 LAB — COPPER, SERUM: Copper: 161 ug/dL (ref 72–166)

## 2014-09-20 LAB — TSH: TSH: 1.37 u[IU]/mL (ref 0.450–4.500)

## 2014-09-20 LAB — CK: Total CK: 43 U/L (ref 24–204)

## 2014-10-03 ENCOUNTER — Telehealth: Payer: Self-pay | Admitting: Cardiology

## 2014-10-03 NOTE — Telephone Encounter (Signed)
Pt c/o Shortness Of Breath: STAT if SOB developed within the last 24 hours or pt is noticeably SOB on the phone  1. Are you currently SOB (can you hear that pt is SOB on the phone)? No 2. How long have you been experiencing SOB? Several months  3. Are you SOB when sitting or when up moving around? Moving around 4. Are you currently experiencing any other symptoms? No

## 2014-10-03 NOTE — Telephone Encounter (Signed)
The pt sates that he has been SOB for several months but that it has been worse lately. He denies any edema and states that his BP is good and that he has no other complaints. He is requesting an appointment with Dr Ron Parker as he is concerned that he may have a "blockage in his heart". The pt has been scheduled to see Dr Ron Parker on 10/10/14 at 11 am. The pt is in agreement with date and time of appt.

## 2014-10-03 NOTE — Telephone Encounter (Signed)
Patient called to schedule appt with Dr. Rockey Situ as he is having sob.  Patient has never been see by [facility] yet.  Patient instructed to call [facility] office and ask for triage nurse concerning SOB.

## 2014-10-10 ENCOUNTER — Inpatient Hospital Stay (HOSPITAL_COMMUNITY)
Admission: AD | Admit: 2014-10-10 | Discharge: 2014-10-14 | DRG: 809 | Disposition: A | Discharge status: Home / Self Care | Payer: Medicare Other | Source: Ambulatory Visit | Attending: Internal Medicine | Admitting: Internal Medicine

## 2014-10-10 ENCOUNTER — Encounter: Payer: Self-pay | Admitting: Cardiology

## 2014-10-10 ENCOUNTER — Observation Stay (HOSPITAL_COMMUNITY): Payer: Medicare Other

## 2014-10-10 ENCOUNTER — Ambulatory Visit (INDEPENDENT_AMBULATORY_CARE_PROVIDER_SITE_OTHER): Payer: Medicare Other | Admitting: Cardiology

## 2014-10-10 VITALS — BP 98/34 | HR 64 | Ht 65.0 in | Wt 130.8 lb

## 2014-10-10 DIAGNOSIS — I7300 Raynaud's syndrome without gangrene: Secondary | ICD-10-CM | POA: Diagnosis present

## 2014-10-10 DIAGNOSIS — Z9849 Cataract extraction status, unspecified eye: Secondary | ICD-10-CM

## 2014-10-10 DIAGNOSIS — I959 Hypotension, unspecified: Secondary | ICD-10-CM | POA: Diagnosis present

## 2014-10-10 DIAGNOSIS — I351 Nonrheumatic aortic (valve) insufficiency: Secondary | ICD-10-CM | POA: Diagnosis not present

## 2014-10-10 DIAGNOSIS — D591 Other autoimmune hemolytic anemias: Secondary | ICD-10-CM | POA: Diagnosis not present

## 2014-10-10 DIAGNOSIS — C9110 Chronic lymphocytic leukemia of B-cell type not having achieved remission: Secondary | ICD-10-CM | POA: Diagnosis not present

## 2014-10-10 DIAGNOSIS — Z800 Family history of malignant neoplasm of digestive organs: Secondary | ICD-10-CM

## 2014-10-10 DIAGNOSIS — R0602 Shortness of breath: Secondary | ICD-10-CM | POA: Diagnosis not present

## 2014-10-10 DIAGNOSIS — D599 Acquired hemolytic anemia, unspecified: Secondary | ICD-10-CM | POA: Diagnosis present

## 2014-10-10 DIAGNOSIS — R001 Bradycardia, unspecified: Secondary | ICD-10-CM

## 2014-10-10 DIAGNOSIS — Z79899 Other long term (current) drug therapy: Secondary | ICD-10-CM

## 2014-10-10 DIAGNOSIS — R17 Unspecified jaundice: Secondary | ICD-10-CM | POA: Diagnosis present

## 2014-10-10 DIAGNOSIS — R0600 Dyspnea, unspecified: Secondary | ICD-10-CM | POA: Diagnosis not present

## 2014-10-10 DIAGNOSIS — Z87891 Personal history of nicotine dependence: Secondary | ICD-10-CM

## 2014-10-10 DIAGNOSIS — D594 Other nonautoimmune hemolytic anemias: Secondary | ICD-10-CM

## 2014-10-10 DIAGNOSIS — I5032 Chronic diastolic (congestive) heart failure: Secondary | ICD-10-CM | POA: Diagnosis present

## 2014-10-10 DIAGNOSIS — Z882 Allergy status to sulfonamides status: Secondary | ICD-10-CM

## 2014-10-10 DIAGNOSIS — D479 Neoplasm of uncertain behavior of lymphoid, hematopoietic and related tissue, unspecified: Secondary | ICD-10-CM | POA: Insufficient documentation

## 2014-10-10 DIAGNOSIS — M3320 Polymyositis, organ involvement unspecified: Secondary | ICD-10-CM | POA: Diagnosis present

## 2014-10-10 DIAGNOSIS — Z9081 Acquired absence of spleen: Secondary | ICD-10-CM | POA: Diagnosis present

## 2014-10-10 DIAGNOSIS — Z79891 Long term (current) use of opiate analgesic: Secondary | ICD-10-CM

## 2014-10-10 DIAGNOSIS — Z7982 Long term (current) use of aspirin: Secondary | ICD-10-CM

## 2014-10-10 LAB — BRAIN NATRIURETIC PEPTIDE: B Natriuretic Peptide: 279.6 pg/mL — ABNORMAL HIGH (ref 0.0–100.0)

## 2014-10-10 LAB — COMPREHENSIVE METABOLIC PANEL
ALBUMIN: 3.4 g/dL — AB (ref 3.5–5.0)
ALK PHOS: 114 U/L (ref 38–126)
ALT: 90 U/L — ABNORMAL HIGH (ref 17–63)
ANION GAP: 6 (ref 5–15)
AST: 196 U/L — ABNORMAL HIGH (ref 15–41)
BILIRUBIN TOTAL: 2.9 mg/dL — AB (ref 0.3–1.2)
BUN: 30 mg/dL — ABNORMAL HIGH (ref 6–20)
CO2: 26 mmol/L (ref 22–32)
Calcium: 9.1 mg/dL (ref 8.9–10.3)
Chloride: 105 mmol/L (ref 101–111)
Creatinine, Ser: 1.17 mg/dL (ref 0.61–1.24)
GFR, EST NON AFRICAN AMERICAN: 56 mL/min — AB (ref >60)
Glucose, Bld: 185 mg/dL — ABNORMAL HIGH (ref 65–99)
Potassium: 5 mmol/L (ref 3.5–5.1)
Sodium: 137 mmol/L (ref 135–145)
Total Protein: 6.3 g/dL — ABNORMAL LOW (ref 6.5–8.1)

## 2014-10-10 LAB — CBC WITH DIFFERENTIAL/PLATELET
Basophils Absolute: 0 10*3/uL (ref 0.0–0.1)
Basophils Relative: 0 % (ref 0–1)
Eosinophils Absolute: 0 10*3/uL (ref 0.0–0.7)
Eosinophils Relative: 0 % (ref 0–5)
HEMATOCRIT: 18.3 % — AB (ref 39.0–52.0)
HEMOGLOBIN: 5.5 g/dL — AB (ref 13.0–17.0)
LYMPHS ABS: 54.5 10*3/uL — AB (ref 0.7–4.0)
Lymphocytes Relative: 95 % — ABNORMAL HIGH (ref 12–46)
MCH: 36.2 pg — ABNORMAL HIGH (ref 26.0–34.0)
MCHC: 30.1 g/dL (ref 30.0–36.0)
MCV: 120.4 fL — ABNORMAL HIGH (ref 78.0–100.0)
MONO ABS: 0.6 10*3/uL (ref 0.1–1.0)
Monocytes Relative: 1 % — ABNORMAL LOW (ref 3–12)
Neutro Abs: 2.3 10*3/uL (ref 1.7–7.7)
Neutrophils Relative %: 4 % — ABNORMAL LOW (ref 43–77)
PLATELETS: 161 10*3/uL (ref 150–400)
RBC: 1.52 MIL/uL — ABNORMAL LOW (ref 4.22–5.81)
RDW: 19.9 % — AB (ref 11.5–15.5)
WBC: 57.4 10*3/uL (ref 4.0–10.5)

## 2014-10-10 LAB — URINALYSIS, ROUTINE W REFLEX MICROSCOPIC
BILIRUBIN URINE: NEGATIVE
Glucose, UA: NEGATIVE mg/dL
Ketones, ur: NEGATIVE mg/dL
LEUKOCYTES UA: NEGATIVE
Nitrite: NEGATIVE
PROTEIN: NEGATIVE mg/dL
Specific Gravity, Urine: 1.01 (ref 1.005–1.030)
UROBILINOGEN UA: 0.2 mg/dL (ref 0.0–1.0)
pH: 5 (ref 5.0–8.0)

## 2014-10-10 LAB — PREPARE RBC (CROSSMATCH)

## 2014-10-10 LAB — CK: Total CK: 46 U/L — ABNORMAL LOW (ref 49–397)

## 2014-10-10 LAB — URINE MICROSCOPIC-ADD ON

## 2014-10-10 LAB — HEMOGLOBIN AND HEMATOCRIT, BLOOD
HCT: 22.5 % — ABNORMAL LOW (ref 39.0–52.0)
Hemoglobin: 6.8 g/dL — CL (ref 13.0–17.0)

## 2014-10-10 LAB — DIRECT ANTIGLOBULIN TEST (NOT AT ARMC)
DAT, COMPLEMENT: POSITIVE
DAT, IGG: POSITIVE

## 2014-10-10 LAB — TROPONIN I

## 2014-10-10 LAB — D-DIMER, QUANTITATIVE: D-Dimer, Quant: 4.08 ug/mL-FEU — ABNORMAL HIGH (ref 0.00–0.48)

## 2014-10-10 LAB — TSH: TSH: 1.985 u[IU]/mL (ref 0.350–4.500)

## 2014-10-10 LAB — RETICULOCYTES
RBC.: 1.84 MIL/uL — ABNORMAL LOW (ref 4.22–5.81)
RETIC CT PCT: 12.3 % — AB (ref 0.4–3.1)
Retic Count, Absolute: 226.3 10*3/uL — ABNORMAL HIGH (ref 19.0–186.0)

## 2014-10-10 LAB — OCCULT BLOOD X 1 CARD TO LAB, STOOL: Fecal Occult Bld: NEGATIVE

## 2014-10-10 LAB — LACTATE DEHYDROGENASE: LDH: 1565 U/L — ABNORMAL HIGH (ref 98–192)

## 2014-10-10 MED ORDER — VITAMIN B-12 1000 MCG PO TABS
1000.0000 ug | ORAL_TABLET | Freq: Every day | ORAL | Status: DC
  Administered 2014-10-10 – 2014-10-14 (×5): 1000 ug via ORAL
  Filled 2014-10-10 (×5): qty 1

## 2014-10-10 MED ORDER — SODIUM CHLORIDE 0.9 % IV SOLN: 250.0000 mL | INTRAVENOUS | Status: DC | PRN

## 2014-10-10 MED ORDER — ONDANSETRON HCL 4 MG/2ML IJ SOLN: 4.0000 mg | Freq: Four times a day (QID) | INTRAMUSCULAR | Status: DC | PRN

## 2014-10-10 MED ORDER — OMEGA-3-ACID ETHYL ESTERS 1 G PO CAPS
1.0000 g | ORAL_CAPSULE | Freq: Every day | ORAL | Status: DC
Start: 2014-10-10 — End: 2014-10-14
  Administered 2014-10-10 – 2014-10-14 (×5): 1 g via ORAL
  Filled 2014-10-10 (×5): qty 1

## 2014-10-10 MED ORDER — SODIUM CHLORIDE 0.9 % IV SOLN
Freq: Once | INTRAVENOUS | Status: AC
  Administered 2014-10-11: 01:00:00 via INTRAVENOUS

## 2014-10-10 MED ORDER — HEPARIN SODIUM (PORCINE) 5000 UNIT/ML IJ SOLN
5000.0000 [IU] | Freq: Three times a day (TID) | INTRAMUSCULAR | Status: DC
  Administered 2014-10-10: 5000 [IU] via SUBCUTANEOUS

## 2014-10-10 MED ORDER — SODIUM CHLORIDE 0.9 % IJ SOLN: 3.0000 mL | INTRAMUSCULAR | Status: DC | PRN

## 2014-10-10 MED ORDER — PREDNISONE 10 MG PO TABS
80.0000 mg | ORAL_TABLET | Freq: Every day | ORAL | Status: DC
Start: 2014-10-11 — End: 2014-10-12
  Administered 2014-10-11: 80 mg via ORAL
  Filled 2014-10-10 (×3): qty 1

## 2014-10-10 MED ORDER — BUDESONIDE 0.25 MG/2ML IN SUSP
0.2500 mg | Freq: Two times a day (BID) | RESPIRATORY_TRACT | Status: DC
  Administered 2014-10-11 – 2014-10-14 (×5): 0.25 mg via RESPIRATORY_TRACT
  Filled 2014-10-10 (×10): qty 2

## 2014-10-10 MED ORDER — VITAMIN D3 25 MCG (1000 UNIT) PO TABS
1000.0000 [IU] | ORAL_TABLET | Freq: Every day | ORAL | Status: DC
  Administered 2014-10-10 – 2014-10-14 (×5): 1000 [IU] via ORAL
  Filled 2014-10-10 (×5): qty 1

## 2014-10-10 MED ORDER — CITALOPRAM HYDROBROMIDE 40 MG PO TABS
40.0000 mg | ORAL_TABLET | Freq: Every day | ORAL | Status: DC
  Administered 2014-10-11 – 2014-10-12 (×2): 40 mg via ORAL
  Filled 2014-10-10 (×2): qty 1

## 2014-10-10 MED ORDER — FOLIC ACID 1 MG PO TABS
2.0000 mg | ORAL_TABLET | Freq: Every day | ORAL | Status: DC
  Administered 2014-10-10: 2 mg via ORAL
  Filled 2014-10-10 (×2): qty 2

## 2014-10-10 MED ORDER — HYDROCODONE-ACETAMINOPHEN 5-325 MG PO TABS
1.0000 | ORAL_TABLET | Freq: Four times a day (QID) | ORAL | Status: DC | PRN
  Administered 2014-10-10 – 2014-10-13 (×4): 1 via ORAL
  Filled 2014-10-10 (×4): qty 1

## 2014-10-10 MED ORDER — CYANOCOBALAMIN 1000 MCG/ML IJ SOLN
1000.0000 ug | INTRAMUSCULAR | Status: DC
  Administered 2014-10-11: 1000 ug via INTRAMUSCULAR
  Filled 2014-10-10: qty 1

## 2014-10-10 MED ORDER — SODIUM CHLORIDE 0.9 % IV SOLN
40.0000 mg | Freq: Once | INTRAVENOUS | Status: AC
  Administered 2014-10-10: 40 mg via INTRAVENOUS
  Filled 2014-10-10: qty 4

## 2014-10-10 MED ORDER — FUROSEMIDE 10 MG/ML IJ SOLN
20.0000 mg | Freq: Once | INTRAMUSCULAR | Status: AC
  Administered 2014-10-11: 20 mg via INTRAVENOUS

## 2014-10-10 MED ORDER — ASPIRIN 81 MG PO CHEW
81.0000 mg | CHEWABLE_TABLET | Freq: Every day | ORAL | Status: DC
  Administered 2014-10-10 – 2014-10-14 (×5): 81 mg via ORAL
  Filled 2014-10-10 (×6): qty 1

## 2014-10-10 MED ORDER — FERROUS SULFATE 325 (65 FE) MG PO TABS
325.0000 mg | ORAL_TABLET | Freq: Every day | ORAL | Status: DC
  Filled 2014-10-10: qty 1

## 2014-10-10 MED ORDER — SODIUM CHLORIDE 0.9 % IJ SOLN
3.0000 mL | Freq: Two times a day (BID) | INTRAMUSCULAR | Status: DC
  Administered 2014-10-10 – 2014-10-14 (×9): 3 mL via INTRAVENOUS

## 2014-10-10 MED ORDER — FUROSEMIDE 80 MG PO TABS
80.0000 mg | ORAL_TABLET | Freq: Every day | ORAL | Status: DC
  Administered 2014-10-10 – 2014-10-13 (×4): 80 mg via ORAL
  Filled 2014-10-10 (×5): qty 1

## 2014-10-10 MED ORDER — MYCOPHENOLATE MOFETIL 250 MG PO CAPS
500.0000 mg | ORAL_CAPSULE | Freq: Every day | ORAL | Status: DC
Start: 2014-10-10 — End: 2014-10-14
  Administered 2014-10-11 – 2014-10-14 (×4): 500 mg via ORAL
  Filled 2014-10-10 (×5): qty 2

## 2014-10-10 MED ORDER — ACETAMINOPHEN 325 MG PO TABS: 650.0000 mg | ORAL_TABLET | ORAL | Status: DC | PRN

## 2014-10-10 MED ORDER — CLOTRIMAZOLE 10 MG MT TROC
10.0000 mg | Freq: Every day | OROMUCOSAL | Status: DC
  Administered 2014-10-10 – 2014-10-14 (×18): 10 mg via ORAL
  Filled 2014-10-10 (×22): qty 1

## 2014-10-10 MED ORDER — FLUTICASONE PROPIONATE 50 MCG/ACT NA SUSP
2.0000 | Freq: Every day | NASAL | Status: DC | PRN
  Filled 2014-10-10: qty 16

## 2014-10-10 MED ORDER — POTASSIUM CHLORIDE CRYS ER 20 MEQ PO TBCR
20.0000 meq | EXTENDED_RELEASE_TABLET | Freq: Every day | ORAL | Status: DC
  Administered 2014-10-10 – 2014-10-14 (×5): 20 meq via ORAL
  Filled 2014-10-10 (×5): qty 1

## 2014-10-10 MED ORDER — CALCIUM CARBONATE-VITAMIN D 500-200 MG-UNIT PO TABS
1.0000 | ORAL_TABLET | Freq: Two times a day (BID) | ORAL | Status: DC
  Administered 2014-10-10: 1 via ORAL
  Filled 2014-10-10 (×2): qty 1

## 2014-10-10 MED ORDER — ADULT MULTIVITAMIN W/MINERALS CH
1.0000 | ORAL_TABLET | Freq: Every day | ORAL | Status: DC
  Administered 2014-10-11 – 2014-10-14 (×4): 1 via ORAL
  Filled 2014-10-10 (×4): qty 1

## 2014-10-10 MED ORDER — CALCIUM CARBONATE-VITAMIN D 500-200 MG-UNIT PO TABS
1.0000 | ORAL_TABLET | Freq: Two times a day (BID) | ORAL | Status: DC
  Administered 2014-10-11 – 2014-10-14 (×7): 1 via ORAL
  Filled 2014-10-10 (×10): qty 1

## 2014-10-10 MED ORDER — TEMAZEPAM 15 MG PO CAPS
30.0000 mg | ORAL_CAPSULE | Freq: Every day | ORAL | Status: DC
  Administered 2014-10-10 – 2014-10-13 (×4): 30 mg via ORAL
  Filled 2014-10-10 (×4): qty 2

## 2014-10-10 MED ORDER — MONTELUKAST SODIUM 10 MG PO TABS
10.0000 mg | ORAL_TABLET | Freq: Every day | ORAL | Status: DC
  Administered 2014-10-10 – 2014-10-14 (×5): 10 mg via ORAL
  Filled 2014-10-10 (×5): qty 1

## 2014-10-10 MED ORDER — PREDNISONE 50 MG PO TABS
80.0000 mg | ORAL_TABLET | Freq: Every day | ORAL | Status: DC
  Filled 2014-10-10: qty 1

## 2014-10-10 MED ORDER — SODIUM CHLORIDE 0.9 % IV SOLN: Freq: Once | INTRAVENOUS | Status: DC

## 2014-10-10 MED ORDER — PANTOPRAZOLE SODIUM 40 MG IV SOLR
40.0000 mg | INTRAVENOUS | Status: DC
  Administered 2014-10-10 – 2014-10-12 (×3): 40 mg via INTRAVENOUS
  Filled 2014-10-10 (×4): qty 40

## 2014-10-10 MED ORDER — PREDNISONE 50 MG PO TABS
80.0000 mg | ORAL_TABLET | Freq: Once | ORAL | Status: AC
  Administered 2014-10-11: 80 mg via ORAL
  Filled 2014-10-10: qty 1

## 2014-10-10 MED ORDER — ALBUTEROL SULFATE (2.5 MG/3ML) 0.083% IN NEBU: 2.5000 mg | INHALATION_SOLUTION | Freq: Four times a day (QID) | RESPIRATORY_TRACT | Status: DC | PRN

## 2014-10-10 NOTE — Progress Notes (Signed)
SATURATION QUALIFICATIONS: (This note is used to comply with regulatory documentation for home oxygen)  Patient Saturations on Room Air at Rest = 97%  Patient Saturations on Room Air while Ambulating = 76%  Patient Saturations on 3 Liters of oxygen while Ambulating =95%  Please briefly explain why patient needs home oxygen:desats during ambulations

## 2014-10-10 NOTE — Progress Notes (Signed)
Cardiology Office Note   Date:  10/10/2014   ID:  [name] [name], DOB [birth year], MRN [id number]  PCP:  Otilio Miu, MD  Cardiologist:  Dola Argyle, MD   Chief Complaint  Patient presents with  . Appointment    Shortness of breath      History of Present Illness: [name] [name] is a 81 y.o. male who presents today for the evaluation of shortness of breath and overall fatigue. Historically the patient has polymyositis that is under good control. He also has CLL. His hematologist is in [place]. There are notes in EPIC. The patient has developed new significant exertional shortness of breath. He denies PND or orthopnea. He does not have any significant edema. However he is so weak and short of breath that he cannot ambulate well. In addition he has had a change in his skin color over the past one to 2 weeks. This is documented by his daughter who knows him very well and is here with him today. He appears mildly jaundiced to me. She says that it is marked by her evaluation.  Past Medical History  Diagnosis Date  . CLL (chronic lymphoblastic leukemia) 2006    Rituxan treatment 2006  . Atrial septal aneurysm 2009    Insignificant, no, January, 2009  . Infection of PEG site     cellulitis.Marland KitchenResolved  . Vertebral compression fracture     multiple levels  . Herpes zoster     right chest  . Aortic insufficiency 2009    mild, Mild, echo, 2012  . Diastolic dysfunction     mild  . Chest pain     EF 55%, echo, October, 2009, possible inferior and posterior borderline hypokinesis, patient has never  had cardiac catheterization  . Edema     EF 55%, echo, October, 2009, possible borderline inferior and posterior hypokinesis... heart catheterization has never been done(July 10, 2009)  . Myositis     Caused pulmonary insufficiency with muscle weakness in the past / Dr.Willis-neurology, Imuran and prednisone  . Calculus of kidney   . Inguinal hernia without mention of obstruction or gangrene,  unilateral or unspecified, (not specified as recurrent)     right   . Unspecified gastritis and gastroduodenitis without mention of hemorrhage   . Abdominal pain, epigastric   . Nonspecific elevation of levels of transaminase or lactic acid dehydrogenase (LDH)   . Feeding difficulties and mismanagement   . Edema of male genital organs     Resolved, occurred during illness related to myositis  . Swelling of arm     left.Marland KitchenMarland KitchenResolved.... no DVT  . LFT elevation     Related to Imuran in the past... dose was adjusted  . Dyslipidemia     statins not used due to LFT abnormalities  . Shortness of breath     Breathing abnormalities related to muscle weakness from myositis  . RBBB (right bundle branch block)     Old  . Bradycardia     Sinus bradycardia, old  . Ejection fraction     EF 60%, echo, October, 2012  . Mitral regurgitation     Mild / moderate, echo, 2012  . Right ventricular dysfunction     Mild, echo, 2012  . Febrile illness     May, 2013, question sinusitis  . Polymyositis 11/26/2012    Past Surgical History  Procedure Laterality Date  . Splenectomy    . Hernia repair      left side  . Peg placement  06/2007  .  Peg tube removal  12/2007  . Kyphosis surgery      multilevel vertebral  . Odontoid fracture surgery      anterior screw fixation  . Nasal sinus surgery      x2  . Cataract extraction      bi lateral    Patient Active Problem List   Diagnosis Date Noted  . Bradycardia     Priority: High  . Ejection fraction     Priority: High  . Aortic insufficiency     Priority: High  . Diastolic dysfunction     Priority: High  . Chest pain     Priority: High  . Edema     Priority: High  . Dyslipidemia     Priority: High  . Shortness of breath     Priority: High  . DDD (degenerative disc disease), lumbosacral 08/27/2014  . H/O disease 08/27/2014  . Muscle ache 08/27/2014  . Raynaud's phenomenon 08/27/2014  . Special screening for malignant neoplasms, colon  08/27/2014  . Acute bronchitis 11/01/2013  . Polymyositis 11/26/2012  . Febrile illness   . CLL (chronic lymphoblastic leukemia)   . Atrial septal aneurysm   . Vertebral compression fracture   . Herpes zoster   . Myositis   . HYPOKALEMIA 01/19/2009  . Chronic lymphocytic leukemia 08/01/2008  . HYPERLIPIDEMIA-MIXED 07/22/2008  . ANEMIA 07/22/2008  . MYOSITIS 07/22/2008  . OSTEOPOROSIS 07/22/2008  . INGUINAL HERNIA, RIGHT 06/30/2008  . ABDOMINAL PAIN, EPIGASTRIC 05/29/2008  . FEEDING PROBLEM 12/24/2007  . ABNORMAL TRANSAMINASE-LFT'S 12/24/2007  . DYSPNEA 05/22/2007      Current Outpatient Prescriptions  Medication Sig Dispense Refill  . aspirin 81 MG tablet Take 81 mg by mouth daily.    . budesonide (PULMICORT) 180 MCG/ACT inhaler Inhale 1 puff into the lungs 2 (two) times daily.    . calcium-vitamin D (CALCIUM 500+D) 500-200 MG-UNIT per tablet Take 1 tablet by mouth 2 (two) times daily.      . Cholecalciferol (VITAMIN D3) 1000 UNITS tablet Take 1,000 Units by mouth daily.      . citalopram (CELEXA) 40 MG tablet Take 40 mg by mouth daily.     . cyanocobalamin (,VITAMIN B-12,) 1000 MCG/ML injection Inject 1,000 mcg into the muscle every 30 (thirty) days. At the first of the month    . ferrous sulfate 325 (65 FE) MG tablet Take 325 mg by mouth daily with breakfast.      . fish oil-omega-3 fatty acids 1000 MG capsule Take 1 g by mouth daily.     . fluticasone (FLONASE) 50 MCG/ACT nasal spray   3  . furosemide (LASIX) 40 MG tablet Take 1 tablet (40 mg total) by mouth daily. (Patient taking differently: Take 80 mg by mouth daily. ) 90 tablet 3  . HYDROcodone-acetaminophen (NORCO/VICODIN) 5-325 MG per tablet Take 1 tablet by mouth every 6 (six) hours as needed for pain.    . montelukast (SINGULAIR) 10 MG tablet Take 10 mg by mouth daily.  11  . Multiple Vitamin (MULTIVITAMIN) capsule Take 1 capsule by mouth daily.      . mycophenolate (CELLCEPT) 500 MG tablet Take 1 tablet (500 mg  total) by mouth daily. 90 tablet 3  . nystatin cream (MYCOSTATIN)   0  . potassium chloride SA (KLOR-CON M20) 20 MEQ tablet Take 1 tablet (20 mEq total) by mouth daily. Take one tablet by mouth once daily. Take an extra tablet twice a week. 114 tablet 3  . PROAIR HFA 108 (90  BASE) MCG/ACT inhaler   11  . Resource Beneprotein PACK Take 3g three times a day    . temazepam (RESTORIL) 30 MG capsule Take 30 mg by mouth at bedtime.      . triamcinolone cream (KENALOG) 0.1 %   0  . vitamin B-12 (CYANOCOBALAMIN) 1000 MCG tablet Take 1,000 mcg by mouth daily.       No current facility-administered medications for this visit.    Allergies:   Sulfonamide derivatives    Social History:  The patient  reports that he quit smoking about 34 years ago. His smoking use included Cigarettes. He has a 15.5 pack-year smoking history. He has never used smokeless tobacco. He reports that he does not drink alcohol or use illicit drugs.   Family History:  The patient's  family history includes Breast cancer in his sister; Cancer in his brother, daughter, and sister; Colon cancer in his brother; Diabetes in his grandchild; Emphysema in his father; Hypertension in his mother; Prostate cancer in his brother; Stroke in his mother. There is no history of Heart attack.    ROS:  Please see the history of present illness.     Patient denies fever, chills, headache, sweats, rash, change in vision, change in hearing, chest pain, cough, nausea or vomiting, urinary symptoms. All other systems are reviewed and are negative.   PHYSICAL EXAM: VS:  BP 98/34 mmHg  Pulse 64  Ht 5\' 5"  (1.651 m)  Wt 130 lb 12.8 oz (59.33 kg)  BMI 21.77 kg/m2 , the patient is oriented to person time and place. Affect is normal. He is here with his daughter who knows him very well. Head is atraumatic. Sclera appear to be mildly jaundiced. There is no jugular venous distention. Lungs reveal scattered rales. Cardiac exam reveals S1 and S2. Abdomen is  soft. There is no significant peripheral edema. He has kyphosis of the spine. There are no skin rashes. Neurologic is grossly intact.  EKG:   EKG is done today and reviewed by me. He has sinus rhythm. There is old right bundle branch block.   Recent Labs: 11/01/2013: Pro B Natriuretic peptide (BNP) 283.7 06/17/2014: ALT 13; BUN 18; Creatinine, Ser 0.83; Potassium 4.8; Sodium 140 09/17/2014: Hemoglobin 10.6*; Platelets 225 09/19/2014: TSH 1.370    Lipid Panel No results found for: CHOL, TRIG, HDL, CHOLHDL, VLDL, LDLCALC, LDLDIRECT    Wt Readings from Last 3 Encounters:  10/10/14 130 lb 12.8 oz (59.33 kg)  09/19/14 129 lb 9.6 oz (58.786 kg)  08/27/14 131 lb 2.8 oz (59.5 kg)      Current medicines are reviewed  The patient understands his medicines well.     ASSESSMENT AND PLAN:

## 2014-10-10 NOTE — H&P (Signed)
CARDIOLOGY HISTORY AND PHYSICAL   [name] MRN: [id number]  DOB/AGE: 1929/09/29 81 y.o. Admit date: 10/10/2014  Primary Care Physician: [name] Miu, MD Primary Cardiologist:    [name]  Clinical Summary [name] [name] is a 81 y.o.male . I have known him for many years. In the past he had severe shortness of breath. It turned out that this was related to muscle weakness from polymyositis. This was treated and he did much better over time. His polymyositis is followed by [name] of neurology. When checked recently it was felt that this was stable. The patient also has CLL. He is followed by hematology in [name]. Recent notes are in [facility]. It is noted that his hemoglobin was decreasing. However at that time it was felt to be stable for standard follow-up.  Over the years he has had some mild shortness of breath after his polymyositis was controlled. He had some mild volume overload that was probably related to diastolic dysfunction. He's had good left ventricular function. There has always been the question that he might have coronary disease. He has never undergone cardiac catheterization.  Currently he has marked exertional shortness of breath. This is much worse recently. In the past year he had some shortness of breath and was seen by pulmonary consultation in [name]. I do not have the records. However the patient tells me that there was no definite pulmonary diagnosis. At that time he felt better. However more recently he has marked exertional shortness of breath. He does not have PND or orthopnea. He does not appear to have any significant peripheral edema. His weight is similar to the past.  An additional observation today is that he appears mildly jaundiced. This is definitely new as documented by his daughter who is in the room and nose him very well. Etiology of this is not yet clear.  Allergies  Allergen Reactions  . Sulfonamide Derivatives     Home  Medications Prescriptions prior to admission  Medication Sig Dispense Refill Last Dose  . aspirin 81 MG tablet Take 81 mg by mouth daily.   Taking  . budesonide (PULMICORT) 180 MCG/ACT inhaler Inhale 1 puff into the lungs 2 (two) times daily.   Taking  . calcium-vitamin D (CALCIUM 500+D) 500-200 MG-UNIT per tablet Take 1 tablet by mouth 2 (two) times daily.     Taking  . Cholecalciferol (VITAMIN D3) 1000 UNITS tablet Take 1,000 Units by mouth daily.     Taking  . citalopram (CELEXA) 40 MG tablet Take 40 mg by mouth daily.    Taking  . cyanocobalamin (,VITAMIN B-12,) 1000 MCG/ML injection Inject 1,000 mcg into the muscle every 30 (thirty) days. At the first of the month   Taking  . ferrous sulfate 325 (65 FE) MG tablet Take 325 mg by mouth daily with breakfast.     Taking  . fish oil-omega-3 fatty acids 1000 MG capsule Take 1 g by mouth daily.    Taking  . fluticasone (FLONASE) 50 MCG/ACT nasal spray   3 Taking  . furosemide (LASIX) 40 MG tablet Take 1 tablet (40 mg total) by mouth daily. (Patient taking differently: Take 80 mg by mouth daily. ) 90 tablet 3 Taking  . HYDROcodone-acetaminophen (NORCO/VICODIN) 5-325 MG per tablet Take 1 tablet by mouth every 6 (six) hours as needed for pain.   Taking  . montelukast (SINGULAIR) 10 MG tablet Take 10 mg by mouth daily.  11 Taking  . Multiple Vitamin (MULTIVITAMIN) capsule Take  1 capsule by mouth daily.     Taking  . mycophenolate (CELLCEPT) 500 MG tablet Take 1 tablet (500 mg total) by mouth daily. 90 tablet 3 Taking  . nystatin cream (MYCOSTATIN)   0 Taking  . potassium chloride SA (KLOR-CON M20) 20 MEQ tablet Take 1 tablet (20 mEq total) by mouth daily. Take one tablet by mouth once daily. Take an extra tablet twice a week. 114 tablet 3 Taking  . PROAIR HFA 108 (90 BASE) MCG/ACT inhaler   11 Taking  . Resource Beneprotein PACK Take 3g three times a day   Taking  . temazepam (RESTORIL) 30 MG capsule Take 30 mg by mouth at bedtime.     Taking  .  triamcinolone cream (KENALOG) 0.1 %   0 Taking  . vitamin B-12 (CYANOCOBALAMIN) 1000 MCG tablet Take 1,000 mcg by mouth daily.     Taking    Scheduled Medications    Infusions    PRN Medications    Past Medical History  Diagnosis Date  . CLL (chronic lymphoblastic leukemia) 2006    Rituxan treatment 2006  . Atrial septal aneurysm 2009    Insignificant, no, January, 2009  . Infection of PEG site     cellulitis.Marland KitchenResolved  . Vertebral compression fracture     multiple levels  . Herpes zoster     right chest  . Aortic insufficiency 2009    mild, Mild, echo, 2012  . Diastolic dysfunction     mild  . Chest pain     EF 55%, echo, October, 2009, possible inferior and posterior borderline hypokinesis, patient has never  had cardiac catheterization  . Edema     EF 55%, echo, October, 2009, possible borderline inferior and posterior hypokinesis... heart catheterization has never been done(July 10, 2009)  . Myositis     Caused pulmonary insufficiency with muscle weakness in the past / [facility], Imuran and prednisone  . Calculus of kidney   . Inguinal hernia without mention of obstruction or gangrene, unilateral or unspecified, (not specified as recurrent)     right   . Unspecified gastritis and gastroduodenitis without mention of hemorrhage   . Abdominal pain, epigastric   . Nonspecific elevation of levels of transaminase or lactic acid dehydrogenase (LDH)   . Feeding difficulties and mismanagement   . Edema of male genital organs     Resolved, occurred during illness related to myositis  . Swelling of arm     left.Marland KitchenMarland KitchenResolved.... no DVT  . LFT elevation     Related to Imuran in the past... dose was adjusted  . Dyslipidemia     statins not used due to LFT abnormalities  . Shortness of breath     Breathing abnormalities related to muscle weakness from myositis  . RBBB (right bundle branch block)     Old  . Bradycardia     Sinus bradycardia, old  . Ejection  fraction     EF 60%, echo, October, 2012  . Mitral regurgitation     Mild / moderate, echo, 2012  . Right ventricular dysfunction     Mild, echo, 2012  . Febrile illness     May, 2013, question sinusitis  . Polymyositis 11/26/2012    Past Surgical History  Procedure Laterality Date  . Splenectomy    . Hernia repair      left side  . Peg placement  06/2007  . Peg tube removal  12/2007  . Kyphosis surgery      multilevel vertebral  .  Odontoid fracture surgery      anterior screw fixation  . Nasal sinus surgery      x2  . Cataract extraction      bi lateral    Family History  Problem Relation Age of Onset  . Breast cancer Sister   . Colon cancer Brother   . Prostate cancer Brother   . Stroke Mother   . Hypertension Mother   . Emphysema Father   . Diabetes Grandchild   . Heart attack Neg Hx   . Cancer Brother   . Cancer Sister   . Cancer Daughter     Social History Mr. Nanney reports that he quit smoking about 34 years ago. His smoking use included Cigarettes. He has a 15.5 pack-year smoking history. He has never used smokeless tobacco. Mr. Verastegui reports that he does not drink alcohol.  Review of Systems Otherwise reviewed and negative except as outlined. Patient denies fever, chills, headache, sweats, rash, change in vision, change in hearing, chest pain, cough, nausea or vomiting, urinary symptoms. All other systems are reviewed and are negative.  Physical Examination Temp:  [98.2 F (36.8 C)] 98.2 F (36.8 C) (07/01 1235) Pulse Rate:  [64-69] 69 (07/01 1235) Resp:  [16] 16 (07/01 1235) BP: (98-119)/(34-48) 119/48 mmHg (07/01 1235) SpO2:  [98 %] 98 % (07/01 1235) Weight:  [130 lb 12.8 oz (59.33 kg)] 130 lb 12.8 oz (59.33 kg) (07/01 1049) No intake or output data in the 24 hours ending 10/10/14 1248  Patient is oriented to person time and place. Affect is normal. I examine him with his daughter in the room. Head is atraumatic. His sclerae are mildly icteric.  His skin is mildly icteric. This is a new finding.  On my exam there is suggestion of rales. There is no respiratory distress. He is resting O2 sat is 98%. Cardiac exam reveals an S1 and S2. The abdomen is soft. There is no significant peripheral edema. He has kyphosis of the spine. There are no skin rashes. Neurologic is grossly intact.  Lab Results  Basic Metabolic Panel: No results for input(s): NA, K, CL, CO2, GLUCOSE, BUN, CREATININE, CALCIUM, MG, PHOS in the last 168 hours.  Liver Function Tests: No results for input(s): AST, ALT, ALKPHOS, BILITOT, PROT, ALBUMIN in the last 168 hours.  CBC: No results for input(s): WBC, NEUTROABS, HGB, HCT, MCV, PLT in the last 168 hours.  Cardiac Enzymes: No results for input(s): CKTOTAL, CKMB, CKMBINDEX, TROPONINI in the last 168 hours.  BNP: Invalid input(s): POCBNP   Radiology No results found.  Prior Cardiac Testing/Procedures:   ECG   EKG is done today and reviewed by me. There is normal sinus rhythm. There is old right bundle branch block.   Impression and Recommendations  This patient does not complain in general. Today it is clear that he is deteriorating. Etiology is not clear. He has marked exertional shortness of breath. However I'm not sure that he is volume overloaded. He is developing jaundice. I am admitting him to the hospital so that we can rapidly assess all of the issues to try to stabilize him before he deteriorates further.    Shortness of breath     Etiology of his current worsening shortness of breath is not clear. When he had some increased shortness of breath in the past year, he was evaluated by pulmonary in [place]. He was not told of any significant diagnosis. He felt better after that time. He describes it as exertional. He denies  PND or orthopnea. On examination today I believe he has rales. However he has no other signs of fluid overload. His blood pressure is lower than usual. I'm very hesitant to push his  diuretics before we have more information including his blood work and his chest x-ray. We need to be sure that he does not have an acute worsening of his polymyositis. We also need to be sure he does not have an acute change in his hematologic status. I've ordered a chest x-ray. Two-dimensional echo will be done to reassess LV function. Historically he has mild aortic valvular disease. We will also check his O2 saturation with ambulation. If his x-rays suggest volume overload, diuretics will be pushed. In the past I have wondered if he has coronary disease. Cardiac catheterization has never been done. It seems very unlikely that his current exertional shortness of breath is an anginal equivalent. This question will be kept in mind. When he had shortness of breath in August, 2015. CT scan was done to rule out pulmonary emboli. The chest CT was abnormal, but there were no pulmonary emboli. I will order a d-dimer. He may eventually need CT scanning of the chest.    Aortic insufficiency      Historically he has mild aortic insufficiency. Follow-up 2-D echo will be done.    Chronic lymphocytic leukemia     The patient's CLL has been stable over time. Recently, his hemoglobin has been somewhat lower than usual. Blood will be checked today to see if there has been an acute worsening. We are consulting hematology to be sure that he has not had a significant change in his overall hematologic status.    Jaundice     The patient appears mildly jaundiced. His daughter is very convinced that this is a new finding in the last week. Etiology is not clear. He is not having any obvious GI symptoms. We need to be sure that he is not hemolyzing. Based on his lab data we will make decisions about further GI tests. The patient did have some liver function abnormalities in the past when he was on Imuran for his polymyositis.    Polymyositis     In the past when the patient's polymyositis was first diagnosed, he was very ill.  However after he improved from that illness he has done well over time. He has been stable for many years. He has been on CellCept.  His dose was decreased recently by [name] to be sure that the patient recovered appropriately from a viral illness. I do not know if any of his current symptoms could be related to an acute change in his polymyositis. CPK will be checked. We will consult [name] if there are any further questions about his polymyositis.    Raynaud's phenomenon     There is a history of Raynaud's phenomenon. I am not sure that that plays any role with his current problems.    Hypotension  (relative hypotension)     Blood pressure today is lower than usual. He is not on any significant meds that would be lowering his blood pressure. He is on daily Lasix. I'm hesitant to stop this with rales heard, but higher dose Lasix will not be used unless we see definite evidence of fluid overload.  Daryel November, MD 10/10/2014, 12:48 PM

## 2014-10-10 NOTE — Progress Notes (Signed)
Dr. Eulas Post notified of Hgb 5.5 at this time

## 2014-10-10 NOTE — Assessment & Plan Note (Signed)
The patient appears mildly jaundiced today. His daughter feels that this is a significant change from when she saw him last 1 week ago. I wonder if he is hemolyzing. His hemoglobin had been dropping as documented in his oncology follow-up notes. Although, his hemoglobin was not low enough recently to warrant aggressive evaluation. Blood count will be checked. Liver functions will be checked. He will be admitted to the hospital with full evaluation including hematology consultation.

## 2014-10-10 NOTE — Consult Note (Signed)
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 : I'm tired and short of breath.  HPI: [name] [name] is a very nice 81 year old gentleman. He has a history of CLL. He is diagnosed back in 2001. He is followed at the [facility].  He currently is on CellCept because of polymyositis. His dose was decreased down to 500 mg a couple weeks ago.  He is followed by Dr. Oliva Bustard at the [facility]. He has seen [name] [name] recently.  [name] [name] now comes in with shortness of breath and fatigue. He was found have a hemoglobin of 5.5. Back in March, his white cell count was 21. Hemoglobin was 12. In May, his hemoglobin was 11.4. In early June, his hemoglobin was 10.6. His white cell count was 28,000.  He was admitted by the cardiology service. In echocardiogram was relatively unremarkable.  We are asked to see him because of the severe anemia.  He has not had any issues with the CLL for 3 years. I think he was last treated with Rituxan.  He's had a lot of vertebral compression fractures because of past steroid use. He's had vertebroplasty's.  He thought that he may have had some kind of viral infection back in April.  He's not noted any obvious bleeding. He's had no obvious lymphadenopathy. 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 He's had no rashes. He's had no joint swelling. He's had no flareups of the polymyositis. He's had no cough.  Overall, his performance status is ECOG 2-3.   Past Medical History  Diagnosis Date  . CLL (chronic lymphoblastic leukemia) 2006    Rituxan treatment 2006  . Atrial septal aneurysm 2009    Insignificant, no, January, 2009  . Infection of PEG site     cellulitis.Marland KitchenResolved  . Vertebral compression fracture     multiple levels  . Herpes zoster     right chest  . Aortic insufficiency 2009    mild, Mild, echo, 2012  . Diastolic dysfunction     mild  . Chest pain     EF 55%, echo,  October, 2009, possible inferior and posterior borderline hypokinesis, patient has never  had cardiac catheterization  . Edema     EF 55%, echo, October, 2009, possible borderline inferior and posterior hypokinesis... heart catheterization has never been done(July 10, 2009)  . Myositis     Caused pulmonary insufficiency with muscle weakness in the past / Dr.Willis-neurology, Imuran and prednisone  . Calculus of kidney   . Inguinal hernia without mention of obstruction or gangrene, unilateral or unspecified, (not specified as recurrent)     right   . Unspecified gastritis and gastroduodenitis without mention of hemorrhage   . Abdominal pain, epigastric   . Nonspecific elevation of levels of transaminase or lactic acid dehydrogenase (LDH)   . Feeding difficulties and mismanagement   . Edema of male genital organs     Resolved, occurred during illness related to myositis  . Swelling of arm     left.Marland KitchenMarland KitchenResolved.... no DVT  . LFT elevation     Related to Imuran in the past... dose was adjusted  . Dyslipidemia     statins not used due to LFT abnormalities  . Shortness of breath     Breathing abnormalities related to muscle weakness from myositis  . RBBB (right bundle branch block)     Old  . Bradycardia     Sinus bradycardia, old  . Ejection  fraction     EF 60%, echo, October, 2012  . Mitral regurgitation     Mild / moderate, echo, 2012  . Right ventricular dysfunction     Mild, echo, 2012  . Febrile illness     May, 2013, question sinusitis  . Polymyositis 11/26/2012  :  Past Surgical History  Procedure Laterality Date  . Splenectomy    . Hernia repair      left side  . Peg placement  06/2007  . Peg tube removal  12/2007  . Kyphosis surgery      multilevel vertebral  . Odontoid fracture surgery      anterior screw fixation  . Nasal sinus surgery      x2  . Cataract extraction      bi lateral  :   Current facility-administered medications:  .  0.9 %  sodium chloride  infusion, 250 mL, Intravenous, PRN, Almyra Deforest, PA .  0.9 %  sodium chloride infusion, , Intravenous, Once, [employer], [place] .  0.9 %  sodium chloride infusion, , Intravenous, Once, Volanda Napoleon, MD .  acetaminophen (TYLENOL) tablet 650 mg, 650 mg, Oral, Q4H PRN, Almyra Deforest, PA .  albuterol (PROVENTIL) (2.5 MG/3ML) 0.083% nebulizer solution 2.5 mg, 2.5 mg, Inhalation, Q6H PRN, Almyra Deforest, PA .  aspirin chewable tablet 81 mg, 81 mg, Oral, Daily, Almyra Deforest, PA, 81 mg at 10/10/14 0900 .  budesonide (PULMICORT) nebulizer solution 0.25 mg, 0.25 mg, Nebulization, BID, Almyra Deforest, PA, 0.25 mg at 10/10/14 1710 .  calcium-vitamin D (OSCAL WITH D) 500-200 MG-UNIT per tablet 1 tablet, 1 tablet, Oral, BID WC, Almyra Deforest, [place], 1 tablet at 10/10/14 1713 .  cholecalciferol (VITAMIN D) tablet 1,000 Units, 1,000 Units, Oral, Daily, [place], [place], 1,000 Units at 10/10/14 1713 .  citalopram (CELEXA) tablet 40 mg, 40 mg, Oral, Daily, [place], [place], 40 mg at 10/10/14 1315 .  clotrimazole (MYCELEX) troche 10 mg, 10 mg, Oral, 5 X Daily, Volanda Napoleon, MD .  Derrill Memo ON 10/11/2014] cyanocobalamin ((VITAMIN B-12)) injection 1,000 mcg, 1,000 mcg, Intramuscular, Q30 days, Almyra Deforest, [place] .  fluticasone (FLONASE) 50 MCG/ACT nasal spray 2 spray, 2 spray, Each Nare, Daily PRN, Almyra Deforest, PA .  folic acid (FOLVITE) tablet 2 mg, 2 mg, Oral, Daily, Volanda Napoleon, MD .  furosemide (LASIX) injection 20 mg, 20 mg, Intravenous, Once, Almyra Deforest, [place] .  furosemide (LASIX) tablet 80 mg, 80 mg, Oral, Daily, [place], [place], 80 mg at 10/10/14 1424 .  HYDROcodone-acetaminophen (NORCO/VICODIN) 5-325 MG per tablet 1 tablet, 1 tablet, Oral, Q6H PRN, Almyra Deforest, PA .  montelukast (SINGULAIR) tablet 10 mg, 10 mg, Oral, Daily, Almyra Deforest, [place], 10 mg at 10/10/14 1423 .  multivitamin with minerals tablet 1 tablet, 1 tablet, Oral, Daily, Almyra Deforest, [place], 1 capsule at 10/10/14 1315 .  mycophenolate (CELLCEPT) capsule 500 mg, 500 mg, Oral, Daily, Almyra Deforest, [place], 500 mg at 10/10/14  1500 .  omega-3 acid ethyl esters (LOVAZA) capsule 1 g, 1 g, Oral, Daily, [place], [place], 1 g at 10/10/14 1713 .  ondansetron (ZOFRAN) injection 4 mg, 4 mg, Intravenous, Q6H PRN, Almyra Deforest, PA .  pantoprazole (PROTONIX) injection 40 mg, 40 mg, Intravenous, Q24H, Volanda Napoleon, MD .  potassium chloride SA (K-DUR,KLOR-CON) CR tablet 20 mEq, 20 mEq, Oral, Daily, Almyra Deforest, [place], 20 mEq at 10/10/14 1423 .  [START ON 10/11/2014] predniSONE (DELTASONE) tablet 80 mg, 80 mg, Oral, Q breakfast, Volanda Napoleon,  MD .  sodium chloride 0.9 % injection 3 mL, 3 mL, Intravenous, Q12H, Almyra Deforest, PA, 3 mL at 10/10/14 1315 .  sodium chloride 0.9 % injection 3 mL, 3 mL, Intravenous, PRN, Almyra Deforest, PA .  temazepam (RESTORIL) capsule 30 mg, 30 mg, Oral, QHS, Almyra Deforest, [place] .  vitamin B-12 (CYANOCOBALAMIN) tablet 1,000 mcg, 1,000 mcg, Oral, Daily, Almyra Deforest, [place], 1,000 mcg at 10/10/14 1713:  . sodium chloride   Intravenous Once  . sodium chloride   Intravenous Once  . aspirin  81 mg Oral Daily  . budesonide  0.25 mg Nebulization BID  . calcium-vitamin D  1 tablet Oral BID WC  . cholecalciferol  1,000 Units Oral Daily  . citalopram  40 mg Oral Daily  . clotrimazole  10 mg Oral 5 X Daily  . [START ON 10/11/2014] cyanocobalamin  1,000 mcg Intramuscular Q30 days  . folic acid  2 mg Oral Daily  . furosemide  20 mg Intravenous Once  . furosemide  80 mg Oral Daily  . montelukast  10 mg Oral Daily  . multivitamin with minerals  1 tablet Oral Daily  . mycophenolate  500 mg Oral Daily  . omega-3 acid ethyl esters  1 g Oral Daily  . pantoprazole (PROTONIX) IV  40 mg Intravenous Q24H  . potassium chloride SA  20 mEq Oral Daily  . [START ON 10/11/2014] predniSONE  80 mg Oral Q breakfast  . sodium chloride  3 mL Intravenous Q12H  . temazepam  30 mg Oral QHS  . vitamin B-12  1,000 mcg Oral Daily  :  Allergies  Allergen Reactions  . Sulfonamide Derivatives   :  Family History  Problem Relation Age of Onset  . Breast cancer  Sister   . Colon cancer Brother   . Prostate cancer Brother   . Stroke Mother   . Hypertension Mother   . Emphysema Father   . Diabetes Grandchild   . Heart attack Neg Hx   . Cancer Brother   . Cancer Sister   . Cancer Daughter   :  History   Social History  . Marital Status: Widowed    Spouse Name: N/A  . Number of Children: 4  . Years of Education: college   Occupational History  . retired    Social History Main Topics  . Smoking status: Former Smoker -- 0.50 packs/day for 31 years    Types: Cigarettes    Quit date: 06/29/1980  . Smokeless tobacco: Never Used  . Alcohol Use: No     Comment: rarely  . Drug Use: No  . Sexual Activity: Not on file   Other Topics Concern  . Not on file   Social History Narrative   Patient is widowed and lives alone.   Patient is retired.   Patient has four adult children.   Patient has a college degree.   Patient is right-handed.   Patient drinks one cup of coffee daily.  :  Pertinent items are noted in HPI.  Exam: Patient Vitals for the past 24 hrs:  BP Temp Temp src Pulse Resp SpO2 Height Weight  10/10/14 1656 - - - - - (!) 76 % - -  10/10/14 1235 (!) 119/48 mmHg 98.2 F (36.8 C) Oral 69 16 98 % 5\' 5"  (1.651 m) 128 lb 4.8 oz (58.196 kg)    elderly white gentleman who is somewhat pale in appearance. Head and neck exam shows no adenopathy. There is no scleral icterus. There  is no oral lesions. He has no adenopathy in the neck. Lungs are with some scattered crackles bilaterally. Cardiac exam is regular rate and rhythm with a 2/6 systolic ejection murmur. Abdomen is soft. He has a laparotomy scar. He has no fluid wave. There is no palpable hepatomegaly. Extremities shows some trace edema in his legs. His joint exam shows no joint swelling, warmth or erythema. Skin exam shows no rashes, petechia or ecchymoses. Neurological exam is nonfocal.    Recent Labs  10/10/14 1559  WBC 57.4*  HGB 5.5*  HCT 18.3*  PLT 161    Recent  Labs  10/10/14 1403  NA 137  K 5.0  CL 105  CO2 26  GLUCOSE 185*  BUN 30*  CREATININE 1.17  CALCIUM 9.1    Blood smear review: Pending  Pathology: None     Assessment and Plan: [name] [name] is an 81 year old gentleman with marked anemia. His MCV is quite high. Again, I have to suspect that this is hemolytic anemia. This, in all likelihood, is autoimmune secondary to the CLL.  We are going to have to get him started on high doses steroids. I think this should be effective. If not, then IVIG certainly might be a consideration. Another possibility would be Rituxan.  As far as actual therapy for CLL, he might be candidate for where the new oral agents that we have.  I will also start him on folic acid at 2 mg a day.  As far as transfusing him, I'm sure that he will need least incompatible blood units. With autoimmune hemolysis, it is often difficult to have a precise match with red blood cells. However, the blood bank is often very good in getting a crossmatch that is compatible.  I talked to [name] [name] and his daughter. I spent about an hour with them. I explained to them what I my thought was going on. I outlined by recommendations. It would not surprise me if it took 7-10 days for him to start to show a response.  The fact that he has had his spleen taken out and the fact that he is on CellCept can certainly be factors in how well he responds.  We will follow along closely.  Thanks for allowing [place] to see him.  Pete E.  Rodman Key 19:26

## 2014-10-10 NOTE — Assessment & Plan Note (Signed)
The patient is now having exertional shortness of breath. It does not sound like angina but this is a possibility. It is difficult to assess his volume status. I believe he has rales. However his weight is stable and there is no other evidence of increased fluid. He will need further testing and he is being admitted to the hospital.

## 2014-10-10 NOTE — Progress Notes (Signed)
  Echocardiogram 2D Echocardiogram has been performed.  [name] [name] 10/10/2014, 3:48 PM

## 2014-10-10 NOTE — Progress Notes (Signed)
CRITICAL VALUE ALERT  Critical value received:  hgb 6.8 Date of notification:  10/10/14  Time of notification:  1926  Critical value read back: yes  Nurse who received alert:  Sandrea Hughs  MD notified (1st page):     Time of first page:    MD notified (2nd page):  Time of second page:  Responding MD:    Time MD responded:

## 2014-10-10 NOTE — Progress Notes (Addendum)
Patient seen on arrival to the floor. Home med resumed, labs include CBC, CMP, CPK, trop ordered. Also order CXR and echo per Dr. Ron Parker. Patient denies any SOB at rest, but states has significant SOB with exertion.   Physical exam:  Lung: rale near R base, otherwise CTA Heart: regular, 1/6 systolic murmur General: resting in chair, no acute distress GI: soft, nontender HEENT: normal  Plan laid out by Dr. Kae Heller H&P. Talked with hematology, will see this weekend.  Hilbert Corrigan PA Pager: [phone]   5:20pm Called by nurse to inform hgb 5.5, three weeks ago his hgb was 10.6. So far, he has received 1 dose of subcu heparin which has since been discontinued. Discuss with patient did not notice significant blood in the stool or urine recently, although urine has been slightly pink and stool has been slightly dark. He did have a remote h/o ulcer in the stomach, previously seen a GI doctor in [place]. Will check UA and hemacult. Talked with Jana Half in blood bank who will check with on call pathologist regarding the type of blood for patient with stable CLL. Still waiting for WBC and platelet to come back. Based on hgb and mcv, appears to be microcytic anemia. Will transfuse 2 units of blood tonight once heard back from blood regarding which type of RBC. Obtain repeat hgb and hct every 12 hrs.   Discussed with Dr. Ron Parker, will hold off on urgent GI consult, if hemacult positive, will consult GI  Signed, Almyra Deforest PA Pager: [phone]  6:00pm. Discussed with on call pathology doctor who recommended leukoreduced PRBC for patient with CLL. Ordered 2 units.

## 2014-10-11 ENCOUNTER — Observation Stay (HOSPITAL_COMMUNITY): Payer: Medicare Other

## 2014-10-11 ENCOUNTER — Encounter (HOSPITAL_COMMUNITY): Payer: Self-pay | Admitting: Radiology

## 2014-10-11 DIAGNOSIS — R17 Unspecified jaundice: Secondary | ICD-10-CM | POA: Diagnosis not present

## 2014-10-11 DIAGNOSIS — D594 Other nonautoimmune hemolytic anemias: Secondary | ICD-10-CM | POA: Diagnosis not present

## 2014-10-11 DIAGNOSIS — D591 Other autoimmune hemolytic anemias: Secondary | ICD-10-CM | POA: Diagnosis not present

## 2014-10-11 DIAGNOSIS — C9110 Chronic lymphocytic leukemia of B-cell type not having achieved remission: Secondary | ICD-10-CM | POA: Insufficient documentation

## 2014-10-11 DIAGNOSIS — D479 Neoplasm of uncertain behavior of lymphoid, hematopoietic and related tissue, unspecified: Secondary | ICD-10-CM | POA: Insufficient documentation

## 2014-10-11 DIAGNOSIS — I351 Nonrheumatic aortic (valve) insufficiency: Secondary | ICD-10-CM | POA: Diagnosis not present

## 2014-10-11 LAB — CBC
HCT: 26.6 % — ABNORMAL LOW (ref 39.0–52.0)
Hemoglobin: 8.4 g/dL — ABNORMAL LOW (ref 13.0–17.0)
MCH: 34.4 pg — ABNORMAL HIGH (ref 26.0–34.0)
MCHC: 31.6 g/dL (ref 30.0–36.0)
MCV: 109 fL — ABNORMAL HIGH (ref 78.0–100.0)
PLATELETS: 168 10*3/uL (ref 150–400)
RBC: 2.44 MIL/uL — ABNORMAL LOW (ref 4.22–5.81)
RDW: 26.5 % — ABNORMAL HIGH (ref 11.5–15.5)
WBC: 47.5 10*3/uL — ABNORMAL HIGH (ref 4.0–10.5)

## 2014-10-11 LAB — RETICULOCYTES
RBC.: 2.44 MIL/uL — ABNORMAL LOW (ref 4.22–5.81)
RETIC CT PCT: 8.8 % — AB (ref 0.4–3.1)
Retic Count, Absolute: 214.7 10*3/uL — ABNORMAL HIGH (ref 19.0–186.0)

## 2014-10-11 LAB — HEMOGLOBIN AND HEMATOCRIT, BLOOD
HCT: 22 % — ABNORMAL LOW (ref 39.0–52.0)
Hemoglobin: 7 g/dL — ABNORMAL LOW (ref 13.0–17.0)

## 2014-10-11 LAB — TROPONIN I: Troponin I: <0.03 ng/mL (ref <0.031)

## 2014-10-11 LAB — OCCULT BLOOD X 1 CARD TO LAB, STOOL: Fecal Occult Bld: NEGATIVE

## 2014-10-11 MED ORDER — FOLIC ACID 1 MG PO TABS
4.0000 mg | ORAL_TABLET | Freq: Every day | ORAL | Status: DC
  Administered 2014-10-12 – 2014-10-14 (×3): 4 mg via ORAL
  Filled 2014-10-11 (×4): qty 4

## 2014-10-11 MED ORDER — SODIUM CHLORIDE 0.9 % IV SOLN
4.0000 mg | Freq: Once | INTRAVENOUS | Status: AC
  Administered 2014-10-11: 4 mg via INTRAVENOUS
  Filled 2014-10-11: qty 5

## 2014-10-11 MED ORDER — IOHEXOL 300 MG/ML  SOLN
80.0000 mL | Freq: Once | INTRAMUSCULAR | Status: AC | PRN
  Administered 2014-10-11: 80 mL via INTRAVENOUS

## 2014-10-11 NOTE — Progress Notes (Signed)
DAILY PROGRESS NOTE  Subjective:  Feeling much better today. Echo is stable with mild AI, but EF remains unchanged. Etiology of decompensation is severe anemia due to hemolysis. Appreciate oncology recommendations. Transfusion has certainly helped.  Objective:  Temp:  [98 F (36.7 C)-98.7 F (37.1 C)] 98 F (36.7 C) (07/02 0630) Pulse Rate:  [59-69] 62 (07/02 0630) Resp:  [16-20] 16 (07/02 0630) BP: (96-119)/(34-48) 106/38 mmHg (07/02 0630) SpO2:  [76 %-100 %] 98 % (07/02 0630) Weight:  [128 lb 4.8 oz (58.196 kg)-129 lb 11.5 oz (58.84 kg)] 129 lb 11.5 oz (58.84 kg) (07/02 0630) Weight change:   Intake/Output from previous day: 07/01 0701 - 07/02 0700 In: 1050 [P.O.:360; Blood:690] Out: 1875 [Urine:1875]  Intake/Output from this shift: Total I/O In: 120 [P.O.:120] Out: 150 [Urine:150]  Medications: Current Facility-Administered Medications  Medication Dose Route Frequency Provider Last Rate Last Dose  . 0.9 %  sodium chloride infusion  250 mL Intravenous PRN Almyra Deforest, PA      . acetaminophen (TYLENOL) tablet 650 mg  650 mg Oral Q4H PRN Almyra Deforest, PA      . albuterol (PROVENTIL) (2.5 MG/3ML) 0.083% nebulizer solution 2.5 mg  2.5 mg Inhalation Q6H PRN Almyra Deforest, PA      . aspirin chewable tablet 81 mg  81 mg Oral Daily Almyra Deforest, [place]   81 mg at 10/11/14 0951  . budesonide (PULMICORT) nebulizer solution 0.25 mg  0.25 mg Nebulization BID Almyra Deforest, PA   0.25 mg at 10/11/14 0909  . calcium-vitamin D (OSCAL WITH D) 500-200 MG-UNIT per tablet 1 tablet  1 tablet Oral BID WC Carlena Bjornstad, MD   1 tablet at 10/11/14 [phone]  . cholecalciferol (VITAMIN D) tablet 1,000 Units  1,000 Units Oral Daily Almyra Deforest, [place]   1,000 Units at 10/11/14 0951  . citalopram (CELEXA) tablet 40 mg  40 mg Oral Daily Almyra Deforest, [place]   40 mg at 10/11/14 0951  . clotrimazole (MYCELEX) troche 10 mg  10 mg Oral 5 X Daily Volanda Napoleon, MD   10 mg at 10/11/14 0951  . cyanocobalamin ((VITAMIN B-12)) injection 1,000 mcg   1,000 mcg Intramuscular Q30 days Almyra Deforest, [place]   1,000 mcg at 10/11/14 0953  . fluticasone (FLONASE) 50 MCG/ACT nasal spray 2 spray  2 spray Each Nare Daily PRN Almyra Deforest, PA      . folic acid (FOLVITE) tablet 4 mg  4 mg Oral Daily Volanda Napoleon, MD      . furosemide (LASIX) tablet 80 mg  80 mg Oral Daily Almyra Deforest, [place]   80 mg at 10/11/14 0951  . HYDROcodone-acetaminophen (NORCO/VICODIN) 5-325 MG per tablet 1 tablet  1 tablet Oral Q6H PRN Almyra Deforest, PA   1 tablet at 10/10/14 2259  . montelukast (SINGULAIR) tablet 10 mg  10 mg Oral Daily Almyra Deforest, [place]   10 mg at 10/11/14 0951  . multivitamin with minerals tablet 1 tablet  1 tablet Oral Daily Almyra Deforest, [place]   1 tablet at 10/11/14 0951  . mycophenolate (CELLCEPT) capsule 500 mg  500 mg Oral Daily Almyra Deforest, [place]   500 mg at 10/11/14 0951  . omega-3 acid ethyl esters (LOVAZA) capsule 1 g  1 g Oral Daily Almyra Deforest, [place]   1 g at 10/11/14 0951  . ondansetron (ZOFRAN) injection 4 mg  4 mg Intravenous Q6H PRN Almyra Deforest, PA      . pantoprazole (PROTONIX) injection 40 mg  40 mg Intravenous Q24H Volanda Napoleon, MD   40 mg at 10/10/14 2301  . potassium chloride SA (K-DUR,KLOR-CON) CR tablet 20 mEq  20 mEq Oral Daily Almyra Deforest, [place]   20 mEq at 10/11/14 0951  . predniSONE (DELTASONE) tablet 80 mg  80 mg Oral Q breakfast Carlena Bjornstad, MD      . sodium chloride 0.9 % injection 3 mL  3 mL Intravenous Q12H Almyra Deforest, PA   3 mL at 10/11/14 0954  . sodium chloride 0.9 % injection 3 mL  3 mL Intravenous PRN Almyra Deforest, PA      . temazepam (RESTORIL) capsule 30 mg  30 mg Oral QHS Almyra Deforest, PA   30 mg at 10/10/14 2259  . vitamin B-12 (CYANOCOBALAMIN) tablet 1,000 mcg  1,000 mcg Oral Daily Almyra Deforest, [place]   1,000 mcg at 10/11/14 6073    Physical Exam: General appearance: alert and no distress Neck: no carotid bruit and no JVD Lungs: clear to auscultation bilaterally Heart: regular rate and rhythm, S1, S2 normal and diastolic murmur: early diastolic 2/6, crescendo at 2nd right  intercostal space Abdomen: soft, non-tender; bowel sounds normal; no masses,  no organomegaly Extremities: extremities normal, atraumatic, no cyanosis or edema Pulses: 2+ and symmetric Skin: Skin color, texture, turgor normal. No rashes or lesions Neurologic: Grossly normal Psych: Pleasant  Lab Results: Results for orders placed or performed during the hospital encounter of 10/10/14 (from the past 48 hour(s))  Comprehensive metabolic panel     Status: Abnormal   Collection Time: 10/10/14  2:03 PM  Result Value Ref Range   Sodium 137 135 - 145 mmol/L   Potassium 5.0 3.5 - 5.1 mmol/L   Chloride 105 101 - 111 mmol/L   CO2 26 22 - 32 mmol/L   Glucose, Bld 185 (H) 65 - 99 mg/dL   BUN 30 (H) 6 - 20 mg/dL   Creatinine, Ser 1.17 0.61 - 1.24 mg/dL   Calcium 9.1 8.9 - 10.3 mg/dL   Total Protein 6.3 (L) 6.5 - 8.1 g/dL   Albumin 3.4 (L) 3.5 - 5.0 g/dL   AST 196 (H) 15 - 41 U/L   ALT 90 (H) 17 - 63 U/L   Alkaline Phosphatase 114 38 - 126 U/L   Total Bilirubin 2.9 (H) 0.3 - 1.2 mg/dL   GFR calc non Af Amer 56 (L) >60 mL/min   GFR calc Af Amer >60 >60 mL/min    Comment: (NOTE) The eGFR has been calculated using the CKD EPI equation. This calculation has not been validated in all clinical situations. eGFR's persistently <60 mL/min signify possible Chronic Kidney Disease.    Anion gap 6 5 - 15  TSH     Status: None   Collection Time: 10/10/14  2:03 PM  Result Value Ref Range   TSH 1.985 0.350 - 4.500 uIU/mL  Troponin I     Status: None   Collection Time: 10/10/14  2:03 PM  Result Value Ref Range   Troponin I <0.03 <0.031 ng/mL    Comment:        NO INDICATION OF MYOCARDIAL INJURY.   Brain natriuretic peptide     Status: Abnormal   Collection Time: 10/10/14  2:03 PM  Result Value Ref Range   B Natriuretic Peptide 279.6 (H) 0.0 - 100.0 pg/mL  CK     Status: Abnormal   Collection Time: 10/10/14  2:03 PM  Result Value Ref Range   Total CK 46 (L) 49 -  397 U/L  D-dimer, quantitative  (not at [facility])     Status: Abnormal   Collection Time: 10/10/14  2:03 PM  Result Value Ref Range   D-Dimer, Quant 4.08 (H) 0.00 - 0.48 ug/mL-FEU    Comment:        AT THE INHOUSE ESTABLISHED CUTOFF VALUE OF 0.48 ug/mL FEU, THIS ASSAY HAS BEEN DOCUMENTED IN THE LITERATURE TO HAVE A SENSITIVITY AND NEGATIVE PREDICTIVE VALUE OF AT LEAST 98 TO 99%.  THE TEST RESULT SHOULD BE CORRELATED WITH AN ASSESSMENT OF THE CLINICAL PROBABILITY OF DVT / VTE.   CBC with Differential/Platelet     Status: Abnormal   Collection Time: 10/10/14  3:59 PM  Result Value Ref Range   WBC 57.4 (HH) 4.0 - 10.5 K/uL    Comment: WHITE COUNT CONFIRMED ON SMEAR REPEATED TO VERIFY CRITICAL RESULT CALLED TO, READ BACK BY AND VERIFIED WITH: MOSS,P RN @1817  BY GRINSTEAD,C 7.1.16    RBC 1.52 (L) 4.22 - 5.81 MIL/uL   Hemoglobin 5.5 (LL) 13.0 - 17.0 g/dL    Comment: REPEATED TO VERIFY CRITICAL RESULT CALLED TO, READ BACK BY AND VERIFIED WITH: P.MOSS,RN 10/10/14 @1655  BY V.WILKINS    HCT 18.3 (L) 39.0 - 52.0 %   MCV 120.4 (H) 78.0 - 100.0 fL   MCH 36.2 (H) 26.0 - 34.0 pg   MCHC 30.1 30.0 - 36.0 g/dL   RDW 19.9 (H) 11.5 - 15.5 %   Platelets 161 150 - 400 K/uL    Comment: PLATELET COUNT CONFIRMED BY SMEAR   Neutrophils Relative % 4 (L) 43 - 77 %   Neutro Abs 2.3 1.7 - 7.7 K/uL   Lymphocytes Relative 95 (H) 12 - 46 %   Lymphs Abs 54.5 (H) 0.7 - 4.0 K/uL   Monocytes Relative 1 (L) 3 - 12 %   Monocytes Absolute 0.6 0.1 - 1.0 K/uL   Eosinophils Relative 0 0 - 5 %   Eosinophils Absolute 0 0.0 - 0.7 K/uL   Basophils Relative 0 0 - 1 %   Basophils Absolute 0 0.0 - 0.1 K/uL   RBC Morphology POLYCHROMASIA PRESENT     Comment: BASOPHILIC STIPPLING   WBC Morphology ATYPICAL LYMPHOCYTES     Comment: ATYPICAL MONONUCLEAR CELLS  Prepare RBC     Status: None   Collection Time: 10/10/14  6:17 PM  Result Value Ref Range   Order Confirmation ORDER PROCESSED BY BLOOD BANK   Direct antiglobulin test     Status: None    Collection Time: 10/10/14  6:17 PM  Result Value Ref Range   DAT, complement POS    DAT, IgG POS   Prepare RBC     Status: None   Collection Time: 10/10/14  6:38 PM  Result Value Ref Range   Order Confirmation ORDER PROCESSED BY BLOOD BANK   Troponin I     Status: None   Collection Time: 10/10/14  6:49 PM  Result Value Ref Range   Troponin I <0.03 <0.031 ng/mL    Comment:        NO INDICATION OF MYOCARDIAL INJURY.   Hemoglobin and hematocrit, blood     Status: Abnormal   Collection Time: 10/10/14  6:49 PM  Result Value Ref Range   Hemoglobin 6.8 (LL) 13.0 - 17.0 g/dL    Comment: REPEATED TO VERIFY CRITICAL RESULT CALLED TO, READ BACK BY AND VERIFIED WITH: E.WALKER,RN 10/10/14 @1920  BY V.WILKINS    HCT 22.5 (L) 39.0 - 52.0 %  Urinalysis, Routine w  reflex microscopic (not at [facility])     Status: Abnormal   Collection Time: 10/10/14  6:49 PM  Result Value Ref Range   Color, Urine AMBER (A) YELLOW    Comment: BIOCHEMICALS MAY BE AFFECTED BY COLOR   APPearance CLEAR CLEAR   Specific Gravity, Urine 1.010 1.005 - 1.030   pH 5.0 5.0 - 8.0   Glucose, UA NEGATIVE NEGATIVE mg/dL   Hgb urine dipstick MODERATE (A) NEGATIVE   Bilirubin Urine NEGATIVE NEGATIVE   Ketones, ur NEGATIVE NEGATIVE mg/dL   Protein, ur NEGATIVE NEGATIVE mg/dL   Urobilinogen, UA 0.2 0.0 - 1.0 mg/dL   Nitrite NEGATIVE NEGATIVE   Leukocytes, UA NEGATIVE NEGATIVE  Type and screen     Status: None (Preliminary result)   Collection Time: 10/10/14  6:49 PM  Result Value Ref Range   ABO/RH(D) O POS    Antibody Screen POS    Sample Expiration 10/13/2014    DAT, IgG POS    Antibody Identification WARM AUTOANTIBODY    Antibody ID,T [place] WARM AUTOANTIBODY    PT AG Type      POSITIVE FOR C ANTIGEN POSITIVE FOR c ANTIGEN NEGATIVE FOR E ANTIGEN POSITIVE FOR e ANTIGEN POSITIVE FOR KELL ANTIGEN POSITIVE FOR KIDD A ANTIGEN POSITIVE FOR KIDD B ANTIGEN POSITIVE FOR M ANTIGEN POSITIVE FOR N ANTIGEN POSITIVE FOR S ANTIGEN    Unit Number [id number]    Blood Component Type RED CELLS,LR    Unit division 00    Status of Unit ISSUED    Donor AG Type NEGATIVE FOR E ANTIGEN    Transfusion Status OK TO TRANSFUSE    Crossmatch Result LEAST INCOMPATIBLE    Unit tag comment VERBAL ORDERS PER DR ENNEVER    Unit Number [id number]    Blood Component Type RED CELLS,LR    Unit division 00    Status of Unit ISSUED    Donor AG Type NEGATIVE FOR E ANTIGEN    Transfusion Status OK TO TRANSFUSE    Crossmatch Result LEAST INCOMPATIBLE    Unit tag comment VERBAL ORDERS PER DR ENNEVER    Unit Number [id number]    Blood Component Type RED CELLS,LR    Unit division 00    Status of Unit ALLOCATED    Transfusion Status OK TO TRANSFUSE    Crossmatch Result LEAST INCOMPATIBLE    Unit tag comment VERBAL ORDERS PER DR    Unit Number [id number]    Blood Component Type RED CELLS,LR    Unit division 00    Status of Unit ALLOCATED    Transfusion Status OK TO TRANSFUSE    Crossmatch Result LEAST INCOMPATIBLE    Unit tag comment VERBAL ORDERS PER DR   Lactate dehydrogenase     Status: Abnormal   Collection Time: 10/10/14  6:49 PM  Result Value Ref Range   LDH 1565 (H) 98 - 192 U/L  Reticulocytes     Status: Abnormal   Collection Time: 10/10/14  6:49 PM  Result Value Ref Range   Retic Ct Pct 12.3 (H) 0.4 - 3.1 %   RBC. 1.84 (L) 4.22 - 5.81 MIL/uL   Retic Count, Manual 226.3 (H) 19.0 - 186.0 K/uL  Urine microscopic-add on     Status: Abnormal   Collection Time: 10/10/14  6:49 PM  Result Value Ref Range   Squamous Epithelial / LPF RARE RARE   WBC, UA 0-2 <3 WBC/hpf   RBC / HPF 7-10 <3 RBC/hpf   Bacteria, UA FEW (A) RARE  Casts HYALINE CASTS (A) NEGATIVE    Comment: GRANULAR CAST   Urine-Other AMORPHOUS URATES/PHOSPHATES   Occult blood card to lab, stool RN will collect     Status: None   Collection Time: 10/10/14  6:52 PM  Result Value Ref Range   Fecal Occult Bld NEGATIVE NEGATIVE  Troponin I     Status: None     Collection Time: 10/11/14 12:37 AM  Result Value Ref Range   Troponin I <0.03 <0.031 ng/mL    Comment:        NO INDICATION OF MYOCARDIAL INJURY.     Imaging: Dg Chest Port 1 View  10/11/2014   CLINICAL DATA:  Shortness of breath for several months worsened over past couple weeks, former smoker, history CLL, diastolic dysfunction, mitral regurgitation, aortic insufficiency  EXAM: PORTABLE CHEST - 1 VIEW  COMPARISON:  Portable exam 0727 hours compared to 08/21/2014  FINDINGS: Enlargement of cardiac silhouette.  Calcified tortuous aorta.  Mediastinal contours and pulmonary vascularity normal.  Peripheral lucency is seen at the lateral mid RIGHT chest, similar to an earlier exam of 07/16/2008, suspect related to the multiple peripheral RIGHT blebs identified by prior CT chest, perhaps slightly more prominent and conspicuity due to patient rotation to the RIGHT.  No definite pneumothorax identified.  Biapical scarring with minimal subsegmental atelectasis at LEFT base.  Lungs appear emphysematous but otherwise clear.  No pleural effusion or pneumothorax.  Bones demineralized with multiple prior vertebroplasties.  IMPRESSION: COPD changes with peripheral blebs in the RIGHT lung.  Minimal subsegmental atelectasis LEFT base.   Electronically Signed   By: Lavonia Dana M.D.   On: 10/11/2014 08:56    Assessment:  1. Active Problems: 2.   Chronic lymphocytic leukemia 3.   DYSPNEA 4.   Shortness of breath 5.   Aortic insufficiency 6.   Polymyositis 7.   Raynaud's phenomenon 8.   Jaundice 9.   Hypotension 10.   Plan:  1. Main issue with weakness and dyspnea is severe anemia, likely due to hemolysis. ?whether recent decrease in cellcept (for PM) played a role in this. Seems better with transfusion and on steroids. Plan for CT today. No active cardiac issues. I discussed the case with Hospital Medicine, Dr. Sloan Leiter. He has agreed for their service to assume care of Mr. Brinlee for his anticipated 1  week hospital stay.  This is much appreciated.  Cardiology will be available as needed. Follow-up with Dr. Ron Parker.  Time Spent Directly with Patient:  15 minutes  Length of Stay:    Pixie Casino, MD, [facility] Attending Cardiologist [place] 10/11/2014, 11:37 AM

## 2014-10-11 NOTE — Progress Notes (Signed)
Am prednisone offered to pt. Pt and daughter concerned tha pt had received ordered dose of pred 1t around midnite last nite. Wishes to take prednisone with dinner today

## 2014-10-11 NOTE — Progress Notes (Signed)
Mr. Ganser looks much better this morning. He just finished his 2 units of blood. His "color" looks better.  There are no labs back yet today.  He had no problems with the blood. It was "least incompatible" but yet he had no transfusion issues.  He currently is on high-dose prednisone. I am increasing his dose of folic acid to 4 mg a day.  I'm going to get a CT of his abdomen to make sure that he does not have an accessory spleen that may have triggered this hemolysis.  He is not having any cough. Has no shortness of breath. There is no chest pain.  He is very appreciative of all the great care that he is getting from the staff upon 3 E.  On his exam, his vital signs are stable. Blood pressure is 106/38. Temperature 90.8. Pulse 62. Head and neck exam shows no ocular or oral lesions. There are no palpable cervical or supraclavicular lymph nodes. Lungs are clear. Cardiac exam regular rate and rhythm with no murmurs, rubs or bruits. Abdomen is soft. There is no fluid wave. There is no palpable abdominal mass. He has a laparotomy scar is well-healed. There is no palpable hepatomegaly. Extremities shows no clubbing, cyanosis or edema.  For now, we will see what his labs look like. We will continue his prednisone.  I will give him a dose of Zometa. This is to help with respect to his bone issues that might occur with him being back on prednisone.  We will see what his CT scan shows.  Typically, it takes a good 7-10 days before hemolytic anemia that is autoimmune improves to the point that patients can be discharged.  We will continue to follow along. His faith is very strong.  Rodman Key 9:22

## 2014-10-12 DIAGNOSIS — D594 Other nonautoimmune hemolytic anemias: Secondary | ICD-10-CM | POA: Diagnosis not present

## 2014-10-12 DIAGNOSIS — D599 Acquired hemolytic anemia, unspecified: Secondary | ICD-10-CM | POA: Diagnosis not present

## 2014-10-12 DIAGNOSIS — C9110 Chronic lymphocytic leukemia of B-cell type not having achieved remission: Secondary | ICD-10-CM

## 2014-10-12 DIAGNOSIS — I5032 Chronic diastolic (congestive) heart failure: Secondary | ICD-10-CM | POA: Diagnosis present

## 2014-10-12 DIAGNOSIS — Z882 Allergy status to sulfonamides status: Secondary | ICD-10-CM | POA: Diagnosis not present

## 2014-10-12 DIAGNOSIS — R0602 Shortness of breath: Secondary | ICD-10-CM

## 2014-10-12 DIAGNOSIS — I351 Nonrheumatic aortic (valve) insufficiency: Secondary | ICD-10-CM

## 2014-10-12 DIAGNOSIS — R001 Bradycardia, unspecified: Secondary | ICD-10-CM | POA: Diagnosis present

## 2014-10-12 DIAGNOSIS — M3320 Polymyositis, organ involvement unspecified: Secondary | ICD-10-CM | POA: Diagnosis present

## 2014-10-12 DIAGNOSIS — Z87891 Personal history of nicotine dependence: Secondary | ICD-10-CM | POA: Diagnosis not present

## 2014-10-12 DIAGNOSIS — Z800 Family history of malignant neoplasm of digestive organs: Secondary | ICD-10-CM | POA: Diagnosis not present

## 2014-10-12 DIAGNOSIS — Z7982 Long term (current) use of aspirin: Secondary | ICD-10-CM | POA: Diagnosis not present

## 2014-10-12 DIAGNOSIS — I7300 Raynaud's syndrome without gangrene: Secondary | ICD-10-CM | POA: Diagnosis present

## 2014-10-12 DIAGNOSIS — Z79891 Long term (current) use of opiate analgesic: Secondary | ICD-10-CM | POA: Diagnosis not present

## 2014-10-12 DIAGNOSIS — Z9081 Acquired absence of spleen: Secondary | ICD-10-CM | POA: Diagnosis present

## 2014-10-12 DIAGNOSIS — D591 Other autoimmune hemolytic anemias: Secondary | ICD-10-CM | POA: Diagnosis present

## 2014-10-12 DIAGNOSIS — I959 Hypotension, unspecified: Secondary | ICD-10-CM | POA: Diagnosis present

## 2014-10-12 DIAGNOSIS — R17 Unspecified jaundice: Secondary | ICD-10-CM | POA: Diagnosis present

## 2014-10-12 DIAGNOSIS — Z9849 Cataract extraction status, unspecified eye: Secondary | ICD-10-CM | POA: Diagnosis not present

## 2014-10-12 DIAGNOSIS — Z79899 Other long term (current) drug therapy: Secondary | ICD-10-CM | POA: Diagnosis not present

## 2014-10-12 LAB — RETICULOCYTES
RBC.: 1.99 MIL/uL — AB (ref 4.22–5.81)
RETIC COUNT ABSOLUTE: 193 10*3/uL — AB (ref 19.0–186.0)
Retic Ct Pct: 9.7 % — ABNORMAL HIGH (ref 0.4–3.1)

## 2014-10-12 LAB — BASIC METABOLIC PANEL
Anion gap: 8 (ref 5–15)
BUN: 31 mg/dL — AB (ref 6–20)
CALCIUM: 9.1 mg/dL (ref 8.9–10.3)
CO2: 26 mmol/L (ref 22–32)
Chloride: 106 mmol/L (ref 101–111)
Creatinine, Ser: 1.11 mg/dL (ref 0.61–1.24)
GFR, EST NON AFRICAN AMERICAN: 60 mL/min — AB (ref >60)
GLUCOSE: 146 mg/dL — AB (ref 65–99)
POTASSIUM: 4.4 mmol/L (ref 3.5–5.1)
SODIUM: 140 mmol/L (ref 135–145)

## 2014-10-12 LAB — CBC WITH DIFFERENTIAL/PLATELET
BAND NEUTROPHILS: 0 % (ref 0–10)
BASOS PCT: 0 % (ref 0–1)
Basophils Absolute: 0 10*3/uL (ref 0.0–0.1)
Blasts: 0 %
EOS ABS: 0 10*3/uL (ref 0.0–0.7)
Eosinophils Relative: 0 % (ref 0–5)
HCT: 21.3 % — ABNORMAL LOW (ref 39.0–52.0)
Hemoglobin: 6.8 g/dL — CL (ref 13.0–17.0)
LYMPHS ABS: 38.1 10*3/uL — AB (ref 0.7–4.0)
Lymphocytes Relative: 84 % — ABNORMAL HIGH (ref 12–46)
MCH: 34.2 pg — AB (ref 26.0–34.0)
MCHC: 31.9 g/dL (ref 30.0–36.0)
MCV: 107 fL — ABNORMAL HIGH (ref 78.0–100.0)
METAMYELOCYTES PCT: 0 %
MONO ABS: 2.3 10*3/uL — AB (ref 0.1–1.0)
MONOS PCT: 5 % (ref 3–12)
Myelocytes: 0 %
Neutro Abs: 5 10*3/uL (ref 1.7–7.7)
Neutrophils Relative %: 11 % — ABNORMAL LOW (ref 43–77)
Other: 0 %
PROMYELOCYTES ABS: 0 %
Platelets: 149 10*3/uL — ABNORMAL LOW (ref 150–400)
RBC: 1.99 MIL/uL — ABNORMAL LOW (ref 4.22–5.81)
RDW: 25.4 % — ABNORMAL HIGH (ref 11.5–15.5)
WBC: 45.4 10*3/uL — AB (ref 4.0–10.5)
nRBC: 0 /100 WBC

## 2014-10-12 LAB — PREPARE RBC (CROSSMATCH)

## 2014-10-12 LAB — SAVE SMEAR

## 2014-10-12 LAB — HEMOGLOBIN AND HEMATOCRIT, BLOOD
HCT: 28.1 % — ABNORMAL LOW (ref 39.0–52.0)
Hemoglobin: 9 g/dL — ABNORMAL LOW (ref 13.0–17.0)

## 2014-10-12 LAB — OCCULT BLOOD X 1 CARD TO LAB, STOOL: Fecal Occult Bld: NEGATIVE

## 2014-10-12 LAB — LACTATE DEHYDROGENASE: LDH: 1159 U/L — ABNORMAL HIGH (ref 98–192)

## 2014-10-12 MED ORDER — FUROSEMIDE 10 MG/ML IJ SOLN
20.0000 mg | Freq: Once | INTRAMUSCULAR | Status: AC
  Filled 2014-10-12: qty 2

## 2014-10-12 MED ORDER — LORATADINE 10 MG PO TABS
10.0000 mg | ORAL_TABLET | Freq: Every day | ORAL | Status: DC
  Administered 2014-10-12 – 2014-10-14 (×3): 10 mg via ORAL
  Filled 2014-10-12 (×4): qty 1

## 2014-10-12 MED ORDER — SODIUM CHLORIDE 0.9 % IV SOLN: Freq: Once | INTRAVENOUS | Status: DC

## 2014-10-12 MED ORDER — SODIUM CHLORIDE 0.9 % IV SOLN
40.0000 mg | Freq: Once | INTRAVENOUS | Status: AC
  Administered 2014-10-12: 40 mg via INTRAVENOUS
  Filled 2014-10-12: qty 4

## 2014-10-12 MED ORDER — FUROSEMIDE 10 MG/ML IJ SOLN
20.0000 mg | Freq: Once | INTRAMUSCULAR | Status: AC
  Administered 2014-10-12: 20 mg via INTRAVENOUS

## 2014-10-12 MED ORDER — PREDNISONE 50 MG PO TABS
80.0000 mg | ORAL_TABLET | Freq: Every day | ORAL | Status: DC
  Administered 2014-10-12 – 2014-10-13 (×2): 80 mg via ORAL
  Filled 2014-10-12 (×2): qty 1

## 2014-10-12 MED ORDER — OXYMETAZOLINE HCL 0.05 % NA SOLN
1.0000 | Freq: Two times a day (BID) | NASAL | Status: DC
  Administered 2014-10-13 – 2014-10-14 (×2): 1 via NASAL
  Filled 2014-10-12 (×2): qty 15

## 2014-10-12 MED ORDER — CITALOPRAM HYDROBROMIDE 20 MG PO TABS
20.0000 mg | ORAL_TABLET | Freq: Every day | ORAL | Status: DC
  Administered 2014-10-13 – 2014-10-14 (×2): 20 mg via ORAL
  Filled 2014-10-12 (×2): qty 1

## 2014-10-12 NOTE — Progress Notes (Signed)
Mr. Stefan feels a little bit weaker. His CT scan did not show any accessory spleen. There is no lymphadenopathy.  He continues on prednisone. He's done well with this so far.  He is on folic acid.  His LDH is still very high but possibly trending down. His reticulocyte count is really about the same. His hemoglobin 6.8. I do think that he would benefit from 2 more units of blood.  His appetite is okay. There is no nausea vomiting.  He did get Zometa yesterday.  He is not having any obvious chest pain.  On his exam, his blood pressure is 04/17/1940. His pulse is 60. Temperature is 98.6. His lungs are clear. Cardiac exam regular rate and rhythm with a 1/6 systolic ejection murmur. Abdomen soft. He has no fluid wave. There is no palpable hepatomegaly. Extremity shows some trace edema.  Again, I think the real key 46S is going to be his reticulocyte count coming down. I would not expect this for another couple days.  I would going transfuse him 2 units. Again, he will need least incompatible blood.  I appreciate all great care that is getting over on 3 E. He is very thankful for the wonderful care that he is getting.  Pete E.  Rodman Key 20:34

## 2014-10-12 NOTE — Progress Notes (Addendum)
Patient heart rate dropped to low 39 and sustained in 40's while asleep. Vitals remained WNL otherwise and patient was asymptomatic.  Upon waking the patient, his rate returned to 50-60's. Hospitalist on call text paged. Will continue to monitor. Blood pressure 107/44, pulse 60, temperature 98.6 F (37 C), temperature source Oral, resp. rate 19, height 5\' 5"  (1.651 m), weight 58.84 kg (129 lb 11.5 oz), SpO2 95 %. Tresa Endo

## 2014-10-12 NOTE — Progress Notes (Signed)
PATIENT DETAILS Name: [name] [name] Age: 81 y.o. Sex: male Date of Birth: 15-Jun-1929 Admit Date: 10/10/2014 Admitting Physician No admitting provider for patient encounter. FWY:OVZCHY Ronnald Ramp, MD  Subjective: Feels good-shortness of breath better.  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 Seen by hematology-started on prednisone. LDH still high, hemoglobin at 6.8 this morning. Will be transfused another 2 units of PRBC this morning (total of 4 units).  Exertional dyspnea: Secondary to anemia, no indication of acute decompensated CHF at present. Treat above and follow clinical course.  Chronic lymphocytic leukemia: [facility] is [facility]. Leukocytosis persists-likely worsened by prednisone.  History of polymyositis: Continue CellCept.  Chronic diastolic heart failure: Clinically compensated. Continue with Lasix.  Mild aortic regurgitation:outpatient follow-up with cardiology  Disposition: Remain inpatient  Antimicrobial agents  See below  Anti-infectives    None      DVT Prophylaxis: SCD's  Code Status: Full code  Family Communication None bedside  Procedures: None  CONSULTS:  cardiology and hematology/oncology  Time spent 25 minutes-Greater than 50% of this time was spent in counseling, explanation of diagnosis, planning of further management, and coordination of care.  MEDICATIONS: Scheduled Meds: . sodium chloride   Intravenous Once  . aspirin  81 mg Oral Daily  . budesonide  0.25 mg Nebulization BID  . calcium-vitamin D  1 tablet Oral BID WC  . cholecalciferol  1,000 Units Oral Daily  . citalopram  40 mg Oral Daily  . clotrimazole  10 mg Oral 5 X Daily  . cyanocobalamin  1,000 mcg Intramuscular Q30 days  . folic acid  4 mg Oral Daily  . furosemide  20 mg Intramuscular Once  . furosemide  20 mg Intravenous Once  . furosemide   80 mg Oral Daily  . montelukast  10 mg Oral Daily  . multivitamin with minerals  1 tablet Oral Daily  . mycophenolate  500 mg Oral Daily  . omega-3 acid ethyl esters  1 g Oral Daily  . pantoprazole (PROTONIX) IV  40 mg Intravenous Q24H  . potassium chloride SA  20 mEq Oral Daily  . [START ON 10/13/2014] predniSONE  80 mg Oral Q supper  . sodium chloride  3 mL Intravenous Q12H  . temazepam  30 mg Oral QHS  . vitamin B-12  1,000 mcg Oral Daily   Continuous Infusions:  PRN Meds:.sodium chloride, acetaminophen, albuterol, fluticasone, HYDROcodone-acetaminophen, ondansetron (ZOFRAN) IV, sodium chloride    PHYSICAL EXAM: Vital signs in last 24 hours: Filed Vitals:   10/12/14 0903 10/12/14 0934 10/12/14 1145 10/12/14 1228  BP: 127/49 116/46 117/51 116/49  Pulse: 66 64 68 66  Temp: 97.8 F (36.6 C) 98.1 F (36.7 C) 98.4 F (36.9 C) 98.4 F (36.9 C)  TempSrc: Oral Oral Oral Oral  Resp: 16 16    Height:      Weight:      SpO2: 95% 96% 100% 99%    Weight change:  Filed Weights   10/10/14 1235 10/11/14 0630  Weight: 58.196 kg (128 lb 4.8 oz) 58.84 kg (129 lb 11.5 oz)   Body mass index is 21.59 kg/(m^2).   Gen Exam: Awake and alert with clear speech.   Neck: Supple, No JVD.   Chest: B/L Clear.   CVS: S1 S2 Regular, no murmurs.  Abdomen: soft, BS +, non tender, non distended.  Extremities: no edema, lower  extremities warm to touch. Neurologic: Non Focal.   Skin: No Rash.   Wounds: N/A.    Intake/Output from previous day:  Intake/Output Summary (Last 24 hours) at 10/12/14 1345 Last data filed at 10/12/14 1145  Gross per 24 hour  Intake    780 ml  Output    675 ml  Net    105 ml     LAB RESULTS: CBC  Recent Labs Lab 10/10/14 1559 10/10/14 1849 10/11/14 1102 10/11/14 2253 10/12/14 0413  WBC 57.4*  --  47.5*  --  45.4*  HGB 5.5* 6.8* 8.4* 7.0* 6.8*  HCT 18.3* 22.5* 26.6* 22.0* 21.3*  PLT 161  --  168  --  149*  MCV 120.4*  --  109.0*  --  107.0*  MCH 36.2*   --  34.4*  --  34.2*  MCHC 30.1  --  31.6  --  31.9  RDW 19.9*  --  26.5*  --  25.4*  LYMPHSABS 54.5*  --   --   --  38.1*  MONOABS 0.6  --   --   --  2.3*  EOSABS 0  --   --   --  0.0  BASOSABS 0  --   --   --  0.0    Chemistries   Recent Labs Lab 10/10/14 1403 10/12/14 0413  NA 137 140  K 5.0 4.4  CL 105 106  CO2 26 26  GLUCOSE 185* 146*  BUN 30* 31*  CREATININE 1.17 1.11  CALCIUM 9.1 9.1    CBG: No results for input(s): GLUCAP in the last 168 hours.  GFR Estimated Creatinine Clearance: 42.7 mL/min (by C-G formula based on Cr of 1.11).  Coagulation profile No results for input(s): INR, PROTIME in the last 168 hours.  Cardiac Enzymes  Recent Labs Lab 10/10/14 1403 10/10/14 1849 10/11/14 0037  TROPONINI <0.03 <0.03 <0.03    Invalid input(s): POCBNP  Recent Labs  10/10/14 1403  DDIMER 4.08*   No results for input(s): HGBA1C in the last 72 hours. No results for input(s): CHOL, HDL, LDLCALC, TRIG, CHOLHDL, LDLDIRECT in the last 72 hours.  Recent Labs  10/10/14 1403  TSH 1.985    Recent Labs  10/11/14 1102 10/12/14 0413  RETICCTPCT 8.8* 9.7*   No results for input(s): LIPASE, AMYLASE in the last 72 hours.  Urine Studies No results for input(s): UHGB, CRYS in the last 72 hours.  Invalid input(s): UACOL, UAPR, USPG, UPH, UTP, UGL, UKET, UBIL, UNIT, UROB, ULEU, UEPI, UWBC, URBC, UBAC, CAST, UCOM, BILUA  MICROBIOLOGY: No results found for this or any previous visit (from the past 240 hour(s)).  RADIOLOGY STUDIES/RESULTS: Ct Abdomen W Contrast  10/11/2014   CLINICAL DATA:  Current history of hemolytic anemia associated with lymphoproliferative disorder. Prior splenectomy. CT requested to evaluate for accessory splenic tissue.  EXAM: CT ABDOMEN WITH CONTRAST  TECHNIQUE: Multidetector CT imaging of the abdomen was performed using the standard protocol following bolus administration of intravenous contrast.  CONTRAST:  76mL OMNIPAQUE IOHEXOL 300 MG/ML  IV. Oral contrast was also administered.  COMPARISON: CT abdomen and pelvis 07/04/2008.  FINDINGS: No evidence of accessory splenic tissue in the splenectomy bed or elsewhere in the abdomen. Scattered calcified granulomata throughout the liver which is moderately enlarged, likely mildly increased in size since the 2010 examination. No significant focal hepatic parenchymal abnormality. Normal pancreas, adrenal glands and right kidney. Approximate 1.2 cm exophytic simple cyst arising from the upper pole of the left kidney, unchanged; no  significant abnormality involving the left kidney. Severe aortoiliac atherosclerosis without aneurysm. No significant lymphadenopathy.  Normal-appearing stomach filled with food. Visualized small bowel normal in appearance. Large stool burden throughout the visualized colon. Extensive diverticulosis involving the visualized descending colon without evidence of acute diverticulitis. No ascites.  Bone window images demonstrate severe generalized osseous demineralization with compression fractures of T11, T12, L1, L2, L3, L4 and L5, all with prior augmentation. Visualized lung bases clear apart from scarring deep in the lower lobes. Heart markedly enlarged.  IMPRESSION: 1. No evidence of accessory splenic tissue in the splenectomy bed or elsewhere in the abdomen. 2. Hepatomegaly, with likely mild increase in size since 2010. No significant focal abnormality involving the liver. 3. Diverticulosis involving the visualized descending colon without evidence of acute diverticulitis.   Electronically Signed   By: Evangeline Dakin M.D.   On: 10/11/2014 13:49   Dg Chest Port 1 View  10/11/2014   CLINICAL DATA:  Shortness of breath for several months worsened over past couple weeks, former smoker, history CLL, diastolic dysfunction, mitral regurgitation, aortic insufficiency  EXAM: PORTABLE CHEST - 1 VIEW  COMPARISON:  Portable exam 0727 hours compared to 08/21/2014  FINDINGS: Enlargement of  cardiac silhouette.  Calcified tortuous aorta.  Mediastinal contours and pulmonary vascularity normal.  Peripheral lucency is seen at the lateral mid RIGHT chest, similar to an earlier exam of 07/16/2008, suspect related to the multiple peripheral RIGHT blebs identified by prior CT chest, perhaps slightly more prominent and conspicuity due to patient rotation to the RIGHT.  No definite pneumothorax identified.  Biapical scarring with minimal subsegmental atelectasis at LEFT base.  Lungs appear emphysematous but otherwise clear.  No pleural effusion or pneumothorax.  Bones demineralized with multiple prior vertebroplasties.  IMPRESSION: COPD changes with peripheral blebs in the RIGHT lung.  Minimal subsegmental atelectasis LEFT base.   Electronically Signed   By: Lavonia Dana M.D.   On: 10/11/2014 08:56    Oren Binet, MD  [facility] Pager:[phone]  If 7PM-7AM, please contact night-coverage [email or web address] Password [email or web address] 10/12/2014, 1:45 PM

## 2014-10-12 NOTE — Progress Notes (Signed)
CRITICAL VALUE ALERT  Critical value received:  hgb 6.8 Date of notification:  10/12/14 Time of notification:    Critical value read back:  Nurse who received alert:  Yentl Verge  MD notified (1st page):  On call cardiology  Time of first page:  0620  MD notified (2nd page):  Time of second page:  Responding MD:   Time MD responded:

## 2014-10-13 DIAGNOSIS — M3320 Polymyositis, organ involvement unspecified: Secondary | ICD-10-CM

## 2014-10-13 DIAGNOSIS — D599 Acquired hemolytic anemia, unspecified: Secondary | ICD-10-CM

## 2014-10-13 LAB — TYPE AND SCREEN
ABO/RH(D): O POS
ANTIBODY SCREEN: POSITIVE
DAT, IgG: POSITIVE
DONOR AG TYPE: NEGATIVE
Donor AG Type: NEGATIVE
Donor AG Type: NEGATIVE
Donor AG Type: NEGATIVE
UNIT DIVISION: 0
UNIT DIVISION: 0
Unit division: 0
Unit division: 0

## 2014-10-13 LAB — CBC WITH DIFFERENTIAL/PLATELET
BAND NEUTROPHILS: 0 % (ref 0–10)
Basophils Absolute: 0 10*3/uL (ref 0.0–0.1)
Basophils Relative: 0 % (ref 0–1)
Blasts: 0 %
EOS ABS: 0 10*3/uL (ref 0.0–0.7)
Eosinophils Relative: 0 % (ref 0–5)
HEMATOCRIT: 26 % — AB (ref 39.0–52.0)
Hemoglobin: 8.4 g/dL — ABNORMAL LOW (ref 13.0–17.0)
LYMPHS PCT: 71 % — AB (ref 12–46)
Lymphs Abs: 26.5 10*3/uL — ABNORMAL HIGH (ref 0.7–4.0)
MCH: 33.1 pg (ref 26.0–34.0)
MCHC: 32.3 g/dL (ref 30.0–36.0)
MCV: 102.4 fL — ABNORMAL HIGH (ref 78.0–100.0)
Metamyelocytes Relative: 0 %
Monocytes Absolute: 2.2 10*3/uL — ABNORMAL HIGH (ref 0.1–1.0)
Monocytes Relative: 6 % (ref 3–12)
Myelocytes: 0 %
NEUTROS ABS: 8.6 10*3/uL — AB (ref 1.7–7.7)
Neutrophils Relative %: 23 % — ABNORMAL LOW (ref 43–77)
PLATELETS: 136 10*3/uL — AB (ref 150–400)
Promyelocytes Absolute: 0 %
RBC: 2.54 MIL/uL — ABNORMAL LOW (ref 4.22–5.81)
RDW: 24.9 % — ABNORMAL HIGH (ref 11.5–15.5)
WBC: 37.3 10*3/uL — AB (ref 4.0–10.5)
nRBC: 0 /100 WBC

## 2014-10-13 LAB — COMPREHENSIVE METABOLIC PANEL
ALBUMIN: 3.3 g/dL — AB (ref 3.5–5.0)
ALK PHOS: 98 U/L (ref 38–126)
ALT: 65 U/L — ABNORMAL HIGH (ref 17–63)
AST: 91 U/L — ABNORMAL HIGH (ref 15–41)
Anion gap: 9 (ref 5–15)
BUN: 34 mg/dL — ABNORMAL HIGH (ref 6–20)
CALCIUM: 8.8 mg/dL — AB (ref 8.9–10.3)
CO2: 26 mmol/L (ref 22–32)
Chloride: 103 mmol/L (ref 101–111)
Creatinine, Ser: 1.06 mg/dL (ref 0.61–1.24)
Glucose, Bld: 117 mg/dL — ABNORMAL HIGH (ref 65–99)
POTASSIUM: 4.1 mmol/L (ref 3.5–5.1)
Sodium: 138 mmol/L (ref 135–145)
Total Bilirubin: 2 mg/dL — ABNORMAL HIGH (ref 0.3–1.2)
Total Protein: 6.2 g/dL — ABNORMAL LOW (ref 6.5–8.1)

## 2014-10-13 LAB — VITAMIN D 25 HYDROXY (VIT D DEFICIENCY, FRACTURES)
VIT D 25 HYDROXY: 48.1 ng/mL (ref 30.0–100.0)
Vit D, 25-Hydroxy: 46.6 ng/mL (ref 30.0–100.0)

## 2014-10-13 LAB — RETICULOCYTES
RBC.: 2.54 MIL/uL — ABNORMAL LOW (ref 4.22–5.81)
RETIC COUNT ABSOLUTE: 193 10*3/uL — AB (ref 19.0–186.0)
RETIC CT PCT: 7.6 % — AB (ref 0.4–3.1)

## 2014-10-13 LAB — HEMOGLOBIN AND HEMATOCRIT, BLOOD
HCT: 28.7 % — ABNORMAL LOW (ref 39.0–52.0)
Hemoglobin: 9.1 g/dL — ABNORMAL LOW (ref 13.0–17.0)

## 2014-10-13 LAB — LACTATE DEHYDROGENASE: LDH: 977 U/L — AB (ref 98–192)

## 2014-10-13 MED ORDER — PANTOPRAZOLE SODIUM 40 MG PO TBEC
40.0000 mg | DELAYED_RELEASE_TABLET | Freq: Every day | ORAL | Status: DC
  Administered 2014-10-13 – 2014-10-14 (×2): 40 mg via ORAL
  Filled 2014-10-13 (×2): qty 1

## 2014-10-13 NOTE — Progress Notes (Signed)
[name] [name] is doing okay. He received 2 units of blood yesterday. He tolerated this well. He's doing okay on steroids. His blood sugars have been doing fairly well.  His LDH is slowly coming down. It went from 1565 on July 1 and now is down to 977. His reticulocyte count is now down to 7%. His hemoglobin is 8.4.  He feels okay. He's had no nausea or vomiting. He's had no cough. He's had no chest wall pain. He's had no leg swelling area and there's been no diarrhea.  On his physical exam, his blood pressure is 107/47. Pulse is 42. Temperature 90.8. His lungs are clear. Cardiac exam regular rate and rhythm with a 1/6 systolic ejection murmur. Abdomen is soft. There is no palpable hepatomegaly. Extremities shows no clubbing, cyanosis or edema.  [name] [name] has autoimmune hemolytic anemia from CLL. I'm sure that his CLL will need to be retreated. This all can be done as an outpatient by his hematologist at [facility].  We will have to continue to follow his labs. He still needs daily labs. The fact that the LDH and reticulocyte count is coming down is certainly encouraging.  We had an excellent prayer session. His faith remains very strong.  He is very appreciative of the outstanding care that he is getting up on [address].  [place] 91:14-16

## 2014-10-13 NOTE — Evaluation (Signed)
Physical Therapy Evaluation Patient Details Name: [name] [name] MRN: [id number] DOB: 16-Jun-1929 Today's Date: 10/13/2014   History of Present Illness  Patient is an 81 y/o male admitted with SOB due to severe anemia related to his history of CLL  Clinical Impression  Patient presents with mobility close to functional baseline at independent level.  Scored 50/56 on Berg balance test at low risk for falls, but demonstrates functional weakness left hip with cross steps at times with ambulation, addressed with HEP given and pt reports has exercise bands at home.  No further skilled PT needs at this time.      Follow Up Recommendations No PT follow up    Equipment Recommendations  None recommended by PT    Recommendations for Other Services       Precautions / Restrictions Precautions Precautions: Fall      Mobility  Bed Mobility               General bed mobility comments: patient up in chair  Transfers Overall transfer level: Modified independent                  Ambulation/Gait Ambulation/Gait assistance: Supervision Ambulation Distance (Feet): 200 Feet Assistive device: None Gait Pattern/deviations: Scissoring;Trunk flexed;Decreased stride length        Stairs Stairs: Yes Stairs assistance: Supervision Stair Management: One rail Right;Alternating pattern Number of Stairs: 4 General stair comments: patient eager to do more, but limited for safety reasons due to gown too long and socks too big  Wheelchair Mobility    Modified Rankin (Stroke Patients Only)       Balance                                 Standardized Balance Assessment Standardized Balance Assessment : Berg Balance Test Berg Balance Test Sit to Stand: Able to stand without using hands and stabilize independently Standing Unsupported: Able to stand safely 2 minutes Sitting with Back Unsupported but Feet Supported on Floor or Stool: Able to sit safely and securely 2  minutes Stand to Sit: Sits safely with minimal use of hands Transfers: Able to transfer safely, minor use of hands Standing Unsupported with Eyes Closed: Able to stand 10 seconds safely Standing Ubsupported with Feet Together: Able to place feet together independently and stand 1 minute safely From Standing, Reach Forward with Outstretched Arm: Can reach confidently >25 cm (10") From Standing Position, Pick up Object from Floor: Able to pick up shoe safely and easily From Standing Position, Turn to Look Behind Over each Shoulder: Looks behind from both sides and weight shifts well Turn 360 Degrees: Able to turn 360 degrees safely but slowly Standing Unsupported, Alternately Place Feet on Step/Stool: Able to complete >2 steps/needs minimal assist Standing Unsupported, One Foot in Front: Able to plae foot ahead of the other independently and hold 30 seconds Standing on One Leg: Able to lift leg independently and hold > 10 seconds Total Score: 50         Pertinent Vitals/Pain Pain Assessment: Faces Faces Pain Scale: Hurts a little bit Pain Location: h/o chronic back pain Pain Intervention(s): Monitored during session    Home Living Family/patient expects to be discharged to:: Private residence Living Arrangements: Alone Available Help at Discharge: Family;Available PRN/intermittently Type of Home: House Home Access: Stairs to enter Entrance Stairs-Rails: Left Entrance Stairs-Number of Steps: 3 Home Layout: One level Home Equipment: Walker - 2 wheels;Cane -  single point;Shower seat 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; walk in shower    Prior Function Level of Independence: Independent               Hand Dominance   Dominant Hand: Right    Extremity/Trunk Assessment               Lower Extremity Assessment: Overall WFL for tasks assessed      Cervical / Trunk Assessment: Kyphotic  Communication   Communication: No difficulties   Cognition Arousal/Alertness: Awake/alert Behavior During Therapy: WFL for tasks assessed/performed Overall Cognitive Status: Within Functional Limits for tasks assessed                      General Comments General comments (skin integrity, edema, etc.): educated pt in HEP for SLS and hip strength, issued handout for HEP    Exercises        Assessment/Plan    PT Assessment Patent does not need any further PT services  PT Diagnosis Abnormality of gait   PT Problem List    PT Treatment Interventions     PT Goals (Current goals can be found in the Care Plan section) Acute Rehab PT Goals PT Goal Formulation: All assessment and education complete, DC therapy    Frequency     Barriers to discharge        Co-evaluation               End of Session Equipment Utilized During Treatment: Gait belt Activity Tolerance: Patient tolerated treatment well Patient left: in chair;with call bell/phone within reach;with family/visitor present           Time: 6861-6837 PT Time Calculation (min) (ACUTE ONLY): 25 min   Charges:   PT Evaluation $Initial PT Evaluation Tier I: 1 Procedure PT Treatments $Self Care/Home Management: 8-22   PT G Codes:        Bernette Seeman,CYNDI 2014-10-25, 2:06 PM  Magda Kiel, [place] Oct 25, 2014

## 2014-10-13 NOTE — Progress Notes (Signed)
PATIENT DETAILS Name: [name] [name] Age: 81 y.o. Sex: male Date of Birth: [birth year] Admit Date: 10/10/2014 Admitting Physician No admitting provider for patient encounter. YKD:XIPJAS Ronnald Ramp, MD  Subjective: Significantly improved-does not experience shortness of breath upon ambulating to the bathroom.  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 Seen by hematology-started on prednisone. Hemoglobin stable this morning, has been transfused a total of 4 units of PRBC. LDH and reticulocyte count now down trending.  Exertional dyspnea: Secondary to anemia, no indication of acute decompensated CHF at present. Treat above and follow clinical course.  Chronic lymphocytic leukemia: He is followed at the [place] needed. Leukocytosis persists-likely worsened by prednisone. Will defer to outpatient oncology for further continued care.  History of polymyositis: Continue CellCept.  Chronic diastolic heart failure: Clinically compensated. Continue with Lasix.  Mild aortic regurgitation:outpatient follow-up with cardiology  Sinus bradycardia: Mostly when sleeping-appears asymptomatic. Discussed with cardiology on 7/3-Dr. Hilty-no further recommendations at this point. TSH on 7/1 within normal limits  Disposition: Remain inpatient  Antimicrobial agents  See below  Anti-infectives    None      DVT Prophylaxis: SCD's  Code Status: Full code  Family Communication None bedside  Procedures: None  CONSULTS:  cardiology and hematology/oncology  Time spent 25 minutes-Greater than 50% of this time was spent in counseling, explanation of diagnosis, planning of further management, and coordination of care.  MEDICATIONS: Scheduled Meds: . sodium chloride   Intravenous Once  . aspirin  81 mg Oral Daily  . budesonide  0.25 mg Nebulization BID  . calcium-vitamin D  1 tablet Oral  BID WC  . cholecalciferol  1,000 Units Oral Daily  . citalopram  20 mg Oral Daily  . clotrimazole  10 mg Oral 5 X Daily  . cyanocobalamin  1,000 mcg Intramuscular Q30 days  . folic acid  4 mg Oral Daily  . furosemide  80 mg Oral Daily  . loratadine  10 mg Oral Daily  . montelukast  10 mg Oral Daily  . multivitamin with minerals  1 tablet Oral Daily  . mycophenolate  500 mg Oral Daily  . omega-3 acid ethyl esters  1 g Oral Daily  . oxymetazoline  1 spray Each Nare BID  . pantoprazole  40 mg Oral Q1200  . potassium chloride SA  20 mEq Oral Daily  . predniSONE  80 mg Oral Q supper  . sodium chloride  3 mL Intravenous Q12H  . temazepam  30 mg Oral QHS  . vitamin B-12  1,000 mcg Oral Daily   Continuous Infusions:  PRN Meds:.sodium chloride, acetaminophen, albuterol, fluticasone, HYDROcodone-acetaminophen, ondansetron (ZOFRAN) IV, sodium chloride    PHYSICAL EXAM: Vital signs in last 24 hours: Filed Vitals:   10/12/14 2038 10/12/14 2051 10/13/14 0506 10/13/14 0954  BP: 113/42  107/47 136/53  Pulse: 44  42 42  Temp: 97.7 F (36.5 C)  98 F (36.7 C) 97.6 F (36.4 C)  TempSrc: Oral  Oral Oral  Resp: 18  18 18   Height:      Weight:   57.924 kg (127 lb 11.2 oz)   SpO2: 99% 98% 96% 97%    Weight change:  Filed Weights   10/10/14 1235 10/11/14 0630 10/13/14 0506  Weight: 58.196 kg (128 lb 4.8 oz) 58.84 kg (129 lb 11.5 oz) 57.924 kg (127 lb 11.2 oz)   Body mass index is  21.25 kg/(m^2).   Gen Exam: Awake and alert with clear speech.   Neck: Supple, No JVD.   Chest: B/L Clear.  No rales CVS: S1 S2 Regular, no murmurs.  Abdomen: soft, BS +, non tender, non distended.  Extremities: no edema, lower extremities warm to touch. Neurologic: Non Focal.   Skin: No Rash.   Wounds: N/A.    Intake/Output from previous day:  Intake/Output Summary (Last 24 hours) at 10/13/14 1116 Last data filed at 10/13/14 0900  Gross per 24 hour  Intake   1700 ml  Output   1575 ml  Net    125 ml      LAB RESULTS: CBC  Recent Labs Lab 10/10/14 1559  10/11/14 1102 10/11/14 2253 10/12/14 0413 10/12/14 1712 10/13/14 0444  WBC 57.4*  --  47.5*  --  45.4*  --  37.3*  HGB 5.5*  < > 8.4* 7.0* 6.8* 9.0* 8.4*  HCT 18.3*  < > 26.6* 22.0* 21.3* 28.1* 26.0*  PLT 161  --  168  --  149*  --  136*  MCV 120.4*  --  109.0*  --  107.0*  --  102.4*  MCH 36.2*  --  34.4*  --  34.2*  --  33.1  MCHC 30.1  --  31.6  --  31.9  --  32.3  RDW 19.9*  --  26.5*  --  25.4*  --  24.9*  LYMPHSABS 54.5*  --   --   --  38.1*  --  26.5*  MONOABS 0.6  --   --   --  2.3*  --  2.2*  EOSABS 0  --   --   --  0.0  --  0.0  BASOSABS 0  --   --   --  0.0  --  0.0  < > = values in this interval not displayed.  Chemistries   Recent Labs Lab 10/10/14 1403 10/12/14 0413 10/13/14 0444  NA 137 140 138  K 5.0 4.4 4.1  CL 105 106 103  CO2 26 26 26   GLUCOSE 185* 146* 117*  BUN 30* 31* 34*  CREATININE 1.17 1.11 1.06  CALCIUM 9.1 9.1 8.8*    CBG: No results for input(s): GLUCAP in the last 168 hours.  GFR Estimated Creatinine Clearance: 44 mL/min (by C-G formula based on Cr of 1.06).  Coagulation profile No results for input(s): INR, PROTIME in the last 168 hours.  Cardiac Enzymes  Recent Labs Lab 10/10/14 1403 10/10/14 1849 10/11/14 0037  TROPONINI <0.03 <0.03 <0.03    Invalid input(s): POCBNP  Recent Labs  10/10/14 1403  DDIMER 4.08*   No results for input(s): HGBA1C in the last 72 hours. No results for input(s): CHOL, HDL, LDLCALC, TRIG, CHOLHDL, LDLDIRECT in the last 72 hours.  Recent Labs  10/10/14 1403  TSH 1.985    Recent Labs  10/12/14 0413 10/13/14 0444  RETICCTPCT 9.7* 7.6*   No results for input(s): LIPASE, AMYLASE in the last 72 hours.  Urine Studies No results for input(s): UHGB, CRYS in the last 72 hours.  Invalid input(s): UACOL, UAPR, USPG, UPH, UTP, UGL, UKET, UBIL, UNIT, UROB, ULEU, UEPI, UWBC, URBC, UBAC, CAST, UCOM, BILUA  MICROBIOLOGY: No results  found for this or any previous visit (from the past 240 hour(s)).  RADIOLOGY STUDIES/RESULTS: Ct Abdomen W Contrast  10/11/2014   CLINICAL DATA:  Current history of hemolytic anemia associated with lymphoproliferative disorder. Prior splenectomy. CT requested to evaluate for accessory splenic tissue.  EXAM: CT ABDOMEN WITH CONTRAST  TECHNIQUE: Multidetector CT imaging of the abdomen was performed using the standard protocol following bolus administration of intravenous contrast.  CONTRAST:  64mL OMNIPAQUE IOHEXOL 300 MG/ML IV. Oral contrast was also administered.  COMPARISON: CT abdomen and pelvis 07/04/2008.  FINDINGS: No evidence of accessory splenic tissue in the splenectomy bed or elsewhere in the abdomen. Scattered calcified granulomata throughout the liver which is moderately enlarged, likely mildly increased in size since the 2010 examination. No significant focal hepatic parenchymal abnormality. Normal pancreas, adrenal glands and right kidney. Approximate 1.2 cm exophytic simple cyst arising from the upper pole of the left kidney, unchanged; no significant abnormality involving the left kidney. Severe aortoiliac atherosclerosis without aneurysm. No significant lymphadenopathy.  Normal-appearing stomach filled with food. Visualized small bowel normal in appearance. Large stool burden throughout the visualized colon. Extensive diverticulosis involving the visualized descending colon without evidence of acute diverticulitis. No ascites.  Bone window images demonstrate severe generalized osseous demineralization with compression fractures of T11, T12, L1, L2, L3, L4 and L5, all with prior augmentation. Visualized lung bases clear apart from scarring deep in the lower lobes. Heart markedly enlarged.  IMPRESSION: 1. No evidence of accessory splenic tissue in the splenectomy bed or elsewhere in the abdomen. 2. Hepatomegaly, with likely mild increase in size since 2010. No significant focal abnormality involving  the liver. 3. Diverticulosis involving the visualized descending colon without evidence of acute diverticulitis.   Electronically Signed   By: Evangeline Dakin M.D.   On: 10/11/2014 13:49   Dg Chest Port 1 View  10/11/2014   CLINICAL DATA:  Shortness of breath for several months worsened over past couple weeks, former smoker, history CLL, diastolic dysfunction, mitral regurgitation, aortic insufficiency  EXAM: PORTABLE CHEST - 1 VIEW  COMPARISON:  Portable exam 0727 hours compared to 08/21/2014  FINDINGS: Enlargement of cardiac silhouette.  Calcified tortuous aorta.  Mediastinal contours and pulmonary vascularity normal.  Peripheral lucency is seen at the lateral mid RIGHT chest, similar to an earlier exam of 07/16/2008, suspect related to the multiple peripheral RIGHT blebs identified by prior CT chest, perhaps slightly more prominent and conspicuity due to patient rotation to the RIGHT.  No definite pneumothorax identified.  Biapical scarring with minimal subsegmental atelectasis at LEFT base.  Lungs appear emphysematous but otherwise clear.  No pleural effusion or pneumothorax.  Bones demineralized with multiple prior vertebroplasties.  IMPRESSION: COPD changes with peripheral blebs in the RIGHT lung.  Minimal subsegmental atelectasis LEFT base.   Electronically Signed   By: Lavonia Dana M.D.   On: 10/11/2014 08:56    Oren Binet, MD  [facility] Pager:[phone]  If 7PM-7AM, please contact night-coverage [email or web address] Password [email or web address] 10/13/2014, 11:16 AM   LOS: 1 day

## 2014-10-14 LAB — COMPREHENSIVE METABOLIC PANEL
ALT: 53 U/L (ref 17–63)
AST: 63 U/L — ABNORMAL HIGH (ref 15–41)
Albumin: 3.2 g/dL — ABNORMAL LOW (ref 3.5–5.0)
Alkaline Phosphatase: 95 U/L (ref 38–126)
Anion gap: 7 (ref 5–15)
BUN: 27 mg/dL — ABNORMAL HIGH (ref 6–20)
CO2: 25 mmol/L (ref 22–32)
Calcium: 8.6 mg/dL — ABNORMAL LOW (ref 8.9–10.3)
Chloride: 107 mmol/L (ref 101–111)
Creatinine, Ser: 0.87 mg/dL (ref 0.61–1.24)
GFR calc non Af Amer: >60 mL/min (ref >60)
GLUCOSE: 124 mg/dL — AB (ref 65–99)
Potassium: 4.3 mmol/L (ref 3.5–5.1)
Sodium: 139 mmol/L (ref 135–145)
Total Bilirubin: 1.7 mg/dL — ABNORMAL HIGH (ref 0.3–1.2)
Total Protein: 5.7 g/dL — ABNORMAL LOW (ref 6.5–8.1)

## 2014-10-14 LAB — CBC WITH DIFFERENTIAL/PLATELET
Basophils Absolute: 0 10*3/uL (ref 0.0–0.1)
Basophils Relative: 0 % (ref 0–1)
Eosinophils Absolute: 0 10*3/uL (ref 0.0–0.7)
Eosinophils Relative: 0 % (ref 0–5)
HCT: 27.7 % — ABNORMAL LOW (ref 39.0–52.0)
HEMOGLOBIN: 9 g/dL — AB (ref 13.0–17.0)
Lymphocytes Relative: 74 % — ABNORMAL HIGH (ref 12–46)
Lymphs Abs: 27 10*3/uL — ABNORMAL HIGH (ref 0.7–4.0)
MCH: 33.8 pg (ref 26.0–34.0)
MCHC: 32.5 g/dL (ref 30.0–36.0)
MCV: 104.1 fL — AB (ref 78.0–100.0)
MONOS PCT: 1 % — AB (ref 3–12)
MYELOCYTES: 1 %
Monocytes Absolute: 0.4 10*3/uL (ref 0.1–1.0)
NEUTROS ABS: 8 10*3/uL — AB (ref 1.7–7.7)
Neutrophils Relative %: 21 % — ABNORMAL LOW (ref 43–77)
Other: 3 %
Platelets: 132 10*3/uL — ABNORMAL LOW (ref 150–400)
RBC: 2.66 MIL/uL — AB (ref 4.22–5.81)
RDW: 24.2 % — AB (ref 11.5–15.5)
WBC: 36.5 10*3/uL — ABNORMAL HIGH (ref 4.0–10.5)

## 2014-10-14 LAB — PATHOLOGIST SMEAR REVIEW

## 2014-10-14 LAB — LACTATE DEHYDROGENASE: LDH: 890 U/L — ABNORMAL HIGH (ref 98–192)

## 2014-10-14 LAB — RETICULOCYTES
RBC.: 2.66 MIL/uL — ABNORMAL LOW (ref 4.22–5.81)
RETIC CT PCT: 8.7 % — AB (ref 0.4–3.1)
Retic Count, Absolute: 231.4 10*3/uL — ABNORMAL HIGH (ref 19.0–186.0)

## 2014-10-14 MED ORDER — PREDNISONE 20 MG PO TABS: 80.0000 mg | ORAL_TABLET | Freq: Every day | ORAL | Status: DC

## 2014-10-14 MED ORDER — FLUCONAZOLE 100 MG PO TABS: 100.0000 mg | ORAL_TABLET | Freq: Every day | ORAL | Status: DC

## 2014-10-14 MED ORDER — FOLIC ACID 1 MG PO TABS: 4.0000 mg | ORAL_TABLET | Freq: Every day | ORAL | Status: AC

## 2014-10-14 MED ORDER — PANTOPRAZOLE SODIUM 40 MG PO TBEC: 40.0000 mg | DELAYED_RELEASE_TABLET | Freq: Every day | ORAL | Status: DC

## 2014-10-14 MED ORDER — CITALOPRAM HYDROBROMIDE 20 MG PO TABS: 20.0000 mg | ORAL_TABLET | Freq: Every day | ORAL | Status: DC

## 2014-10-14 NOTE — Progress Notes (Signed)
Mr. Marchio looks real good today. His hemoglobin is holding steady. Today, his hemoglobin is 9.0.  He feels well. He's eating better. He is ambulating.  He is tolerating the prednisone well. He's also on folic acid.  He's had no chest pain. He's had no increased shortness of breath. He's going to the bathroom.  Overall, outset his performance status is ECOG 2.  On his physical exam, his vital signs show temperature of 98.1. Pulse 52. Blood pressure 147/62. Head and neck exam shows no scleral icterus. He has no oral lesions. There is no thrush. Lungs are clear bilaterally. There may be slight crackles at the bases. Cardiac exam regular rate and rhythm with a 1/6 systolic murmur. Abdomen is soft. He has well-healed laparotomy scar. There is no fluid wave. There is no palpable liver tip. Extremities shows no clubbing, cyanosis or edema.  His lab work shows his white cell count been 36.5 (secondary to steroids and CLL) . His hemoglobin is 9. Platelet count 132. LDH is 890. Bilirubin 1.7. His reticulocyte count is about 7.8%.  For now, I think that Mr. Ciancio should be able to go home. His hemoglobin has stabilized. It is clear that the hemolysis is improving. His LDH is coming down. His reticulocyte count is also improving. His bilirubin has come down a little bit.  When he goes home, he will need the prednisone at the dose he is on. He will need folic acid at the dose he is on. I would make sure that he is on Diflucan and Bactrim. He also probably needs to be on some type of PPI for stomach protection.  He needs to see his local hematologist, Dr.Choksi, in 2-3 days. This is essential.  I would not adjust his dose of CellCept up at this point.  We had a great prayer session. His faith remains strong. He is very grateful for the outstanding care that he is received up on [address].  Harriette Ohara 17:14

## 2014-10-14 NOTE — Care Management (Signed)
Important Message  Patient Details  Name: [name] [name] MRN: [id number] Date of Birth: 1929/06/20   Medicare Important Message Given:  Yes-second notification given    [name] 10/14/2014, 10:32 AM

## 2014-10-14 NOTE — Discharge Summary (Signed)
PATIENT DETAILS Name: [name] [name] Age: 81 y.o. Sex: male Date of Birth: 07/16/1929 MRN: [id number]. Admitting Physician: No admitting provider for patient encounter. KZS:[id number] Ronnald Ramp, MD  Admit Date: 10/10/2014 Discharge date: 10/14/2014  Recommendations for Outpatient Follow-up:  1. Please continue to follow CBC, LFTs, reticulocyte count and LDH-patient diagnosed with autoimmune hemolytic anemia and on high-dose prednisone 2. Please taper prednisone at the discussion of patient's primary oncologist 3. New medication-Protonix, Diflucan (stay on while on steroids), prednisone, folic acid   PRIMARY DISCHARGE DIAGNOSIS:  Active Problems:   Chronic lymphocytic leukemia   DYSPNEA   Shortness of breath   Aortic insufficiency   Polymyositis   Raynaud's phenomenon   Jaundice   Hypotension   CLL (chronic lymphocytic leukemia)   Hemolytic anemia associated with lymphoproliferative disorder   Acute hemolytic anemia      PAST MEDICAL HISTORY: Past Medical History  Diagnosis Date  . CLL (chronic lymphoblastic leukemia) 2006    Rituxan treatment 2006  . Atrial septal aneurysm 2009    Insignificant, no, January, 2009  . Infection of PEG site     cellulitis.Marland KitchenResolved  . Vertebral compression fracture     multiple levels  . Herpes zoster     right chest  . Aortic insufficiency 2009    mild, Mild, echo, 2012  . Diastolic dysfunction     mild  . Chest pain     EF 55%, echo, October, 2009, possible inferior and posterior borderline hypokinesis, patient has never  had cardiac catheterization  . Edema     EF 55%, echo, October, 2009, possible borderline inferior and posterior hypokinesis... heart catheterization has never been done(July 10, 2009)  . Myositis     Caused pulmonary insufficiency with muscle weakness in the past / Dr.Willis-neurology, Imuran and prednisone  . Calculus of kidney   . Inguinal hernia without mention of obstruction or gangrene, unilateral or unspecified,  (not specified as recurrent)     right   . Unspecified gastritis and gastroduodenitis without mention of hemorrhage   . Abdominal pain, epigastric   . Nonspecific elevation of levels of transaminase or lactic acid dehydrogenase (LDH)   . Feeding difficulties and mismanagement   . Edema of male genital organs     Resolved, occurred during illness related to myositis  . Swelling of arm     left.Marland KitchenMarland KitchenResolved.... no DVT  . LFT elevation     Related to Imuran in the past... dose was adjusted  . Dyslipidemia     statins not used due to LFT abnormalities  . Shortness of breath     Breathing abnormalities related to muscle weakness from myositis  . RBBB (right bundle branch block)     Old  . Bradycardia     Sinus bradycardia, old  . Ejection fraction     EF 60%, echo, October, 2012  . Mitral regurgitation     Mild / moderate, echo, 2012  . Right ventricular dysfunction     Mild, echo, 2012  . Febrile illness     May, 2013, question sinusitis  . Polymyositis 11/26/2012    DISCHARGE MEDICATIONS: Current Discharge Medication List    START taking these medications   Details  fluconazole (DIFLUCAN) 100 MG tablet Take 1 tablet (100 mg total) by mouth daily. Qty: 30 tablet, Refills: 0    folic acid (FOLVITE) 1 MG tablet Take 4 tablets (4 mg total) by mouth daily. Qty: 120 tablet, Refills: 0   Associated Diagnoses: CLL (chronic lymphocytic leukemia); Hemolytic  anemia associated with lymphoproliferative disorder    pantoprazole (PROTONIX) 40 MG tablet Take 1 tablet (40 mg total) by mouth daily at 12 noon. Qty: 30 tablet, Refills: 0    predniSONE (DELTASONE) 20 MG tablet Take 4 tablets (80 mg total) by mouth daily with supper. Qty: 120 tablet, Refills: 0   Associated Diagnoses: CLL (chronic lymphocytic leukemia); Hemolytic anemia associated with lymphoproliferative disorder      CONTINUE these medications which have CHANGED   Details  citalopram (CELEXA) 20 MG tablet Take 1 tablet (20  mg total) by mouth daily. Qty: 30 tablet, Refills: 0      CONTINUE these medications which have NOT CHANGED   Details  ASMANEX 60 METERED DOSES 220 MCG/INH inhaler Inhale 1 puff into the lungs 2 (two) times daily. Refills: 0    aspirin 81 MG tablet Take 81 mg by mouth at bedtime.     calcium-vitamin D (CALCIUM 500+D) 500-200 MG-UNIT per tablet Take 1 tablet by mouth 2 (two) times daily.      Cholecalciferol (VITAMIN D3) 1000 UNITS tablet Take 1,000 Units by mouth daily.      cyanocobalamin (,VITAMIN B-12,) 1000 MCG/ML injection Inject 1,000 mcg into the muscle every 14 (fourteen) days. At the first of the month    ferrous sulfate 325 (65 FE) MG tablet Take 325 mg by mouth daily with breakfast.      fish oil-omega-3 fatty acids 1000 MG capsule Take 1 g by mouth daily at 12 noon.     furosemide (LASIX) 40 MG tablet Take 1 tablet (40 mg total) by mouth daily. Qty: 90 tablet, Refills: 3    HYDROcodone-acetaminophen (NORCO/VICODIN) 5-325 MG per tablet Take 1 tablet by mouth every 6 (six) hours as needed for pain.    montelukast (SINGULAIR) 10 MG tablet Take 10 mg by mouth at bedtime.  Refills: 11   Associated Diagnoses: Chronic lymphocytic leukemia    Multiple Vitamin (MULTIVITAMIN) capsule Take 1 capsule by mouth daily.      mycophenolate (CELLCEPT) 500 MG tablet Take 1 tablet (500 mg total) by mouth daily. Qty: 90 tablet, Refills: 3    nystatin cream (MYCOSTATIN) Apply 1 application topically daily as needed for dry skin.  Refills: 0    potassium chloride SA (KLOR-CON M20) 20 MEQ tablet Take 1 tablet (20 mEq total) by mouth daily. Take one tablet by mouth once daily. Take an extra tablet twice a week. Qty: 114 tablet, Refills: 3    PROAIR HFA 108 (90 BASE) MCG/ACT inhaler Inhale 2 puffs into the lungs 4 (four) times daily.  Refills: 11   Associated Diagnoses: Chronic lymphocytic leukemia    Resource Beneprotein PACK Take 3g three times a day    temazepam (RESTORIL) 30 MG  capsule Take 30 mg by mouth at bedtime.      triamcinolone cream (KENALOG) 0.1 % Apply 1 application topically daily at 12 noon.  Refills: 0    vitamin B-12 (CYANOCOBALAMIN) 1000 MCG tablet Take 1,000 mcg by mouth daily at 12 noon.       STOP taking these medications     naproxen sodium (ANAPROX) 220 MG tablet      budesonide (PULMICORT) 180 MCG/ACT inhaler      fluticasone (FLONASE) 50 MCG/ACT nasal spray         ALLERGIES:   Allergies  Allergen Reactions  . Sulfonamide Derivatives     BRIEF HPI:  See H&P, Labs, Consult and Test reports for all details in brief, patient is a  81 year old male with history of polymyositis, chronic lymphocyte leukemia, chronic diastolic heart failure who was admitted for evaluation of exertional dyspnea. Further evaluation revealed a hemoglobin of 5.5, patient was thought to have hemolytic anemia.  CONSULTATIONS:   hematology/oncology  PERTINENT RADIOLOGIC STUDIES: Ct Abdomen W Contrast  10/11/2014   CLINICAL DATA:  Current history of hemolytic anemia associated with lymphoproliferative disorder. Prior splenectomy. CT requested to evaluate for accessory splenic tissue.  EXAM: CT ABDOMEN WITH CONTRAST  TECHNIQUE: Multidetector CT imaging of the abdomen was performed using the standard protocol following bolus administration of intravenous contrast.  CONTRAST:  11mL OMNIPAQUE IOHEXOL 300 MG/ML IV. Oral contrast was also administered.  COMPARISON: CT abdomen and pelvis 07/04/2008.  FINDINGS: No evidence of accessory splenic tissue in the splenectomy bed or elsewhere in the abdomen. Scattered calcified granulomata throughout the liver which is moderately enlarged, likely mildly increased in size since the 2010 examination. No significant focal hepatic parenchymal abnormality. Normal pancreas, adrenal glands and right kidney. Approximate 1.2 cm exophytic simple cyst arising from the upper pole of the left kidney, unchanged; no significant abnormality  involving the left kidney. Severe aortoiliac atherosclerosis without aneurysm. No significant lymphadenopathy.  Normal-appearing stomach filled with food. Visualized small bowel normal in appearance. Large stool burden throughout the visualized colon. Extensive diverticulosis involving the visualized descending colon without evidence of acute diverticulitis. No ascites.  Bone window images demonstrate severe generalized osseous demineralization with compression fractures of T11, T12, L1, L2, L3, L4 and L5, all with prior augmentation. Visualized lung bases clear apart from scarring deep in the lower lobes. Heart markedly enlarged.  IMPRESSION: 1. No evidence of accessory splenic tissue in the splenectomy bed or elsewhere in the abdomen. 2. Hepatomegaly, with likely mild increase in size since 2010. No significant focal abnormality involving the liver. 3. Diverticulosis involving the visualized descending colon without evidence of acute diverticulitis.   Electronically Signed   By: Evangeline Dakin M.D.   On: 10/11/2014 13:49   Dg Chest Port 1 View  10/11/2014   CLINICAL DATA:  Shortness of breath for several months worsened over past couple weeks, former smoker, history CLL, diastolic dysfunction, mitral regurgitation, aortic insufficiency  EXAM: PORTABLE CHEST - 1 VIEW  COMPARISON:  Portable exam 0727 hours compared to 08/21/2014  FINDINGS: Enlargement of cardiac silhouette.  Calcified tortuous aorta.  Mediastinal contours and pulmonary vascularity normal.  Peripheral lucency is seen at the lateral mid RIGHT chest, similar to an earlier exam of 07/16/2008, suspect related to the multiple peripheral RIGHT blebs identified by prior CT chest, perhaps slightly more prominent and conspicuity due to patient rotation to the RIGHT.  No definite pneumothorax identified.  Biapical scarring with minimal subsegmental atelectasis at LEFT base.  Lungs appear emphysematous but otherwise clear.  No pleural effusion or  pneumothorax.  Bones demineralized with multiple prior vertebroplasties.  IMPRESSION: COPD changes with peripheral blebs in the RIGHT lung.  Minimal subsegmental atelectasis LEFT base.   Electronically Signed   By: Lavonia Dana M.D.   On: 10/11/2014 08:56     PERTINENT LAB RESULTS: CBC:  Recent Labs  10/13/14 0444 10/13/14 1657 10/14/14 0444  WBC 37.3*  --  36.5*  HGB 8.4* 9.1* 9.0*  HCT 26.0* 28.7* 27.7*  PLT 136*  --  132*   CMET CMP     Component Value Date/Time   NA 139 10/14/2014 0444   NA 140 06/17/2014 0933   NA 139 03/21/2014 1040   K 4.3 10/14/2014 0444   K  4.6 03/21/2014 1040   CL 107 10/14/2014 0444   CL 102 03/21/2014 1040   CO2 25 10/14/2014 0444   CO2 32 03/21/2014 1040   GLUCOSE 124* 10/14/2014 0444   GLUCOSE 75 06/17/2014 0933   GLUCOSE 93 03/21/2014 1040   BUN 27* 10/14/2014 0444   BUN 18 06/17/2014 0933   BUN 20* 03/21/2014 1040   CREATININE 0.87 10/14/2014 0444   CREATININE 1.06 03/21/2014 1040   CALCIUM 8.6* 10/14/2014 0444   CALCIUM 9.2 03/21/2014 1040   PROT 5.7* 10/14/2014 0444   PROT 6.3 06/17/2014 0933   PROT 7.0 03/21/2014 1040   ALBUMIN 3.2* 10/14/2014 0444   ALBUMIN 3.7 03/21/2014 1040   AST 63* 10/14/2014 0444   AST 36 03/21/2014 1040   ALT 53 10/14/2014 0444   ALT 22 03/21/2014 1040   ALKPHOS 95 10/14/2014 0444   ALKPHOS 92 03/21/2014 1040   BILITOT 1.7* 10/14/2014 0444   BILITOT 0.8 06/17/2014 0933   GFRNONAA >60 10/14/2014 0444   GFRNONAA >60 09/20/2013 0953   GFRAA >60 10/14/2014 0444   GFRAA >60 09/20/2013 0953    GFR Estimated Creatinine Clearance: 53.7 mL/min (by C-G formula based on Cr of 0.87). No results for input(s): LIPASE, AMYLASE in the last 72 hours. No results for input(s): CKTOTAL, CKMB, CKMBINDEX, TROPONINI in the last 72 hours. Invalid input(s): POCBNP No results for input(s): DDIMER in the last 72 hours. No results for input(s): HGBA1C in the last 72 hours. No results for input(s): CHOL, HDL, LDLCALC,  TRIG, CHOLHDL, LDLDIRECT in the last 72 hours. No results for input(s): TSH, T4TOTAL, T3FREE, THYROIDAB in the last 72 hours.  Invalid input(s): FREET3  Recent Labs  10/13/14 0444 10/14/14 0444  RETICCTPCT 7.6* 8.7*   Coags: No results for input(s): INR in the last 72 hours.  Invalid input(s): PT Microbiology: No results found for this or any previous visit (from the past 240 hour(s)).   BRIEF HOSPITAL COURSE:  Acute hemolytic anemia: Secondary to autoimmune hemolytic anemia from underlying CLL. Seen by hematology-started on prednisone. Hemoglobin stable this morning, has been transfused a total of 4 units of PRBC. LDH and reticulocyte count now down trending. Seen by hematology this morning, recommendations are to discharge on current dosing of prednisone. At the recommendation of hematology, and started Diflucan but unable to start Bactrim given patient's sulfa allergy. This M.D. with Dr. Parks Neptune recommended did not recommend further antibiotics. Patient was asked to make sure he makes a appointment in the next few days with his primary oncologist at [facility]. He will require close monitoring of his CBC, reticulocyte count and LDH.  Exertional dyspnea: Secondary to anemia, no indication of acute decompensated CHF at present. Much improved after transfusion and prednisone. Patient able to ambulate in the hallway without any difficulty.  Chronic lymphocytic leukemia: He is followed at the [facility] needed. Leukocytosis persists-likely worsened by prednisone. Will defer to outpatient oncology for further continued care.  History of polymyositis: Continue CellCept.  Chronic diastolic heart failure: Clinically compensated. Continue with Lasix.  Mild aortic regurgitation:outpatient follow-up with cardiology  Sinus bradycardia: Mostly when sleeping-appears asymptomatic. Discussed with cardiology on 7/3-Dr. Hilty-no further recommendations at this point. TSH  on 7/1 within normal limits  TODAY-DAY OF DISCHARGE:  Subjective:   [name] [name] today has no headache,no chest abdominal pain,no new weakness tingling or numbness, feels much better wants to go home today.   Objective:   Blood pressure 147/62, pulse 52, temperature 98.1 F (36.7 C),  temperature source Oral, resp. rate 18, height 5\' 5"  (1.651 m), weight 58.015 kg (127 lb 14.4 oz), SpO2 97 %.  Intake/Output Summary (Last 24 hours) at 10/14/14 0942 Last data filed at 10/14/14 0821  Gross per 24 hour  Intake   1180 ml  Output   1300 ml  Net   -120 ml   Filed Weights   10/11/14 0630 10/13/14 0506 10/14/14 0609  Weight: 58.84 kg (129 lb 11.5 oz) 57.924 kg (127 lb 11.2 oz) 58.015 kg (127 lb 14.4 oz)    Exam Awake Alert, Oriented *3, No new F.N deficits, Normal affect [place].AT,PERRAL Supple Neck,No JVD, No cervical lymphadenopathy appriciated.  Symmetrical Chest wall movement, Good air movement bilaterally, CTAB RRR,No Gallops,Rubs or new Murmurs, No Parasternal Heave +ve B.Sounds, Abd Soft, Non tender, No organomegaly appriciated, No rebound -guarding or rigidity. No Cyanosis, Clubbing or edema, No new Rash or bruise  DISCHARGE CONDITION: Stable  DISPOSITION: Home  DISCHARGE INSTRUCTIONS:    Activity:  As tolerated   Diet recommendation: Heart Healthy diet  Discharge Instructions    Call MD for:  difficulty breathing, headache or visual disturbances    Complete by:  As directed      Call MD for:  extreme fatigue    Complete by:  As directed      Call MD for:  persistant dizziness or light-headedness    Complete by:  As directed      Call MD for:  persistant nausea and vomiting    Complete by:  As directed      Diet - low sodium heart healthy    Complete by:  As directed      Increase activity slowly    Complete by:  As directed            Follow-up Information    Follow up with Otilio Miu, MD On 10/20/2014.   Specialty:  Family Medicine   Why:  Appt. @  11:00am   Contact information:   [address] [place] [address] [phone]       Follow up with Forest Gleason, MD. Schedule an appointment as soon as possible for a visit in 5 days.   Specialty:  Oncology   Why:  for post hospital follow up   Contact information:   [place] [address] [phone]       Total Time spent on discharge equals 45 minutes.  SignedOren Binet 10/14/2014 9:42 AM

## 2014-10-14 NOTE — Progress Notes (Signed)
Pt discharge education and instructions completed with pt and daughter at bedside. All voices understanding and denies any questions. Pt IV and telemetry removed; pt discharge home with daughter to transport him home. Pt handed his prescriptions for celexa; diflucan; folic acid; protonix and prednisone. Pt transported off unit via wheelchair with daughter and belongings to the side. Francis Gaines Manvi Guilliams RN.

## 2014-10-15 ENCOUNTER — Telehealth: Payer: Self-pay | Admitting: Neurology

## 2014-10-15 NOTE — Telephone Encounter (Signed)
I called the patient. He had an acute hemolytic anemia from the CLL. He is now on 80 mg of prednisone. I would not go up on the cellcept at this time. He had been cut back to 500 mg daily.

## 2014-10-15 NOTE — Telephone Encounter (Signed)
I called the patient. He stated that Dr. Jannifer Franklin recently cut his Cellcept in half from 1,000 mg to 500 mg. He wanted to let Dr. Jannifer Franklin know that he was just in the hospital and had to have 4 units of blood d/t low hemoglobin (6.8). He is now on 80 mg of Prednisone. He wondered if his dose of Cellcept should be altered?

## 2014-10-15 NOTE — Telephone Encounter (Signed)
Patient called requesting to speak with Dr. Jannifer Franklin regarding his medication Rx. mycophenolate (CELLCEPT) 500 MG tablet. He has some questions about the medication. Please call and advise.

## 2014-10-16 ENCOUNTER — Inpatient Hospital Stay (HOSPITAL_BASED_OUTPATIENT_CLINIC_OR_DEPARTMENT_OTHER): Payer: Medicare Other | Admitting: Oncology

## 2014-10-16 ENCOUNTER — Inpatient Hospital Stay: Payer: Medicare Other | Attending: Oncology

## 2014-10-16 VITALS — BP 130/66 | HR 61 | Temp 96.1°F | Wt 131.4 lb

## 2014-10-16 DIAGNOSIS — N281 Cyst of kidney, acquired: Secondary | ICD-10-CM | POA: Diagnosis not present

## 2014-10-16 DIAGNOSIS — J449 Chronic obstructive pulmonary disease, unspecified: Secondary | ICD-10-CM

## 2014-10-16 DIAGNOSIS — Z7952 Long term (current) use of systemic steroids: Secondary | ICD-10-CM | POA: Diagnosis not present

## 2014-10-16 DIAGNOSIS — Z800 Family history of malignant neoplasm of digestive organs: Secondary | ICD-10-CM

## 2014-10-16 DIAGNOSIS — Z7982 Long term (current) use of aspirin: Secondary | ICD-10-CM

## 2014-10-16 DIAGNOSIS — Z803 Family history of malignant neoplasm of breast: Secondary | ICD-10-CM | POA: Diagnosis not present

## 2014-10-16 DIAGNOSIS — R0602 Shortness of breath: Secondary | ICD-10-CM | POA: Insufficient documentation

## 2014-10-16 DIAGNOSIS — K5730 Diverticulosis of large intestine without perforation or abscess without bleeding: Secondary | ICD-10-CM

## 2014-10-16 DIAGNOSIS — C9110 Chronic lymphocytic leukemia of B-cell type not having achieved remission: Secondary | ICD-10-CM

## 2014-10-16 DIAGNOSIS — I351 Nonrheumatic aortic (valve) insufficiency: Secondary | ICD-10-CM

## 2014-10-16 DIAGNOSIS — Z9081 Acquired absence of spleen: Secondary | ICD-10-CM

## 2014-10-16 DIAGNOSIS — Z8781 Personal history of (healed) traumatic fracture: Secondary | ICD-10-CM | POA: Diagnosis not present

## 2014-10-16 DIAGNOSIS — D591 Other autoimmune hemolytic anemias: Secondary | ICD-10-CM | POA: Diagnosis not present

## 2014-10-16 DIAGNOSIS — Z79899 Other long term (current) drug therapy: Secondary | ICD-10-CM | POA: Diagnosis not present

## 2014-10-16 DIAGNOSIS — Z87891 Personal history of nicotine dependence: Secondary | ICD-10-CM | POA: Diagnosis not present

## 2014-10-16 DIAGNOSIS — R160 Hepatomegaly, not elsewhere classified: Secondary | ICD-10-CM | POA: Diagnosis not present

## 2014-10-16 DIAGNOSIS — Z8711 Personal history of peptic ulcer disease: Secondary | ICD-10-CM

## 2014-10-16 DIAGNOSIS — R609 Edema, unspecified: Secondary | ICD-10-CM

## 2014-10-16 DIAGNOSIS — Z87442 Personal history of urinary calculi: Secondary | ICD-10-CM | POA: Diagnosis not present

## 2014-10-16 DIAGNOSIS — I340 Nonrheumatic mitral (valve) insufficiency: Secondary | ICD-10-CM

## 2014-10-16 DIAGNOSIS — E785 Hyperlipidemia, unspecified: Secondary | ICD-10-CM

## 2014-10-16 DIAGNOSIS — I4510 Unspecified right bundle-branch block: Secondary | ICD-10-CM

## 2014-10-16 LAB — COMPREHENSIVE METABOLIC PANEL
ALT: 37 U/L (ref 17–63)
AST: 40 U/L (ref 15–41)
Albumin: 3.8 g/dL (ref 3.5–5.0)
Alkaline Phosphatase: 107 U/L (ref 38–126)
Anion gap: 1 — ABNORMAL LOW (ref 5–15)
BUN: 23 mg/dL — ABNORMAL HIGH (ref 6–20)
CALCIUM: 7.8 mg/dL — AB (ref 8.9–10.3)
CO2: 29 mmol/L (ref 22–32)
CREATININE: 0.75 mg/dL (ref 0.61–1.24)
Chloride: 106 mmol/L (ref 101–111)
GFR calc non Af Amer: >60 mL/min (ref >60)
GLUCOSE: 86 mg/dL (ref 65–99)
Potassium: 4.1 mmol/L (ref 3.5–5.1)
Sodium: 136 mmol/L (ref 135–145)
Total Bilirubin: 2 mg/dL — ABNORMAL HIGH (ref 0.3–1.2)
Total Protein: 6.7 g/dL (ref 6.5–8.1)

## 2014-10-16 LAB — CBC WITH DIFFERENTIAL/PLATELET
BASOS PCT: 0 %
Band Neutrophils: 0 % (ref 0–10)
Blasts: 0 %
EOS PCT: 0 %
HEMATOCRIT: 33.3 % — AB (ref 40.0–52.0)
HEMOGLOBIN: 10.5 g/dL — AB (ref 13.0–18.0)
LYMPHS PCT: 80 %
MCH: 33.7 pg (ref 26.0–34.0)
MCHC: 31.5 g/dL — AB (ref 32.0–36.0)
MCV: 106.9 fL — ABNORMAL HIGH (ref 80.0–100.0)
MONOS PCT: 9 %
MYELOCYTES: 0 %
Metamyelocytes Relative: 0 %
Neutrophils Relative %: 11 %
Platelets: 132 10*3/uL — ABNORMAL LOW (ref 150–440)
Promyelocytes Absolute: 0 %
RBC: 3.11 MIL/uL — ABNORMAL LOW (ref 4.40–5.90)
RDW: 27.1 % — ABNORMAL HIGH (ref 11.5–14.5)
WBC: 37.4 10*3/uL — AB (ref 3.8–10.6)
nRBC: 2 /100 WBC — ABNORMAL HIGH

## 2014-10-16 NOTE — Progress Notes (Signed)
Patient does have living will.  Former smoker, 

## 2014-10-17 ENCOUNTER — Encounter: Payer: Self-pay | Admitting: Oncology

## 2014-10-17 NOTE — Progress Notes (Signed)
[facility] @ [facility] [phone]) [phone]  [phone]     [name] [name] OB: 1930-02-03  MR#: [id number]  UXN#:[id number]  Patient Care Team: Juline Patch, MD as PCP - General (Family Medicine) Kathrynn Ducking, MD (Neurology) Forest Gleason, MD as Consulting Physician (Unknown Physician Specialty) Carlena Bjornstad, MD as Consulting Physician (Cardiology)  CHIEF COMPLAINT:  Chief Complaint  Patient presents with  . Follow-up    Oncology History   Chief Complaint/Problem List  1.  Autoimmune hemolytic anemia. Previously received Prednisone taper.   2.  Splenectomy in 5/02.  3.  Gastric ulcer diagnosed on endoscopy and multiple biopsies are pending, November 2005. 4.  Chronic lymphocytic leukemia. 5.vitamin B 12 deficiency 6.  Recent admission in the hospital in June of 2016 with hemolytic anemia and hemoglobin of 5.5 patient received blood transfusion was started on steroid     Chronic lymphocytic leukemia   08/01/2008 Initial Diagnosis Chronic lymphocytic leukemia    Oncology Flowsheet 11/02/2013 11/03/2013 11/04/2013 10/11/2014 10/11/2014 10/12/2014 10/13/2014  cyanocobalamin ((VITAMIN B-12)) IM - - - 1,000 mcg   - -  methylPREDNISolone sodium succinate 40 mg/mL (SOLU-MEDROL) IV 40 mg 40 mg 40 mg - - - -  predniSONE (DELTASONE) PO - - - 80 mg 80 mg 80 mg 80 mg  zolendronic acid (ZOMETA) IV - - - 4 mg   - -    INTERVAL HISTORY:  81 year old gentleman was recently admitted in the hospital with increasing shortness of breath.  Hemoglobin was 5.5.  Patient received blood transfusion patient has autoimmune hemolytic anemia secondary to call chronic lymphocytic leukemia.  Had received Rituxan in the past. Patient was started on prednisone 80 mg daily.  Hemoglobin improved to 10.5 patient is here for ongoing evaluation and treatment consideration   REVIEW OF SYSTEMS:    general status: Patient is feeling weak and tired.  No change in a performance status.  No chills.  No  fever. HEENT:.  No evidence of stomatitis Lungs: No cough or shortness of breath Cardiac: No chest pain or paroxysmal nocturnal dyspnea GI: No nausea no vomiting no diarrhea no abdominal pain Skin: No rash Lower extremity no swelling Neurological system: No tingling.  No numbness.  No other focal signs Musculoskeletal system no bony pains  As per HPI. Otherwise, a complete review of systems is negatve.  PAST MEDICAL HISTORY: Past Medical History  Diagnosis Date  . CLL (chronic lymphoblastic leukemia) 2006    Rituxan treatment 2006  . Atrial septal aneurysm 2009    Insignificant, no, January, 2009  . Infection of PEG site     cellulitis.Marland KitchenResolved  . Vertebral compression fracture     multiple levels  . Herpes zoster     right chest  . Aortic insufficiency 2009    mild, Mild, echo, 2012  . Diastolic dysfunction     mild  . Chest pain     EF 55%, echo, October, 2009, possible inferior and posterior borderline hypokinesis, patient has never  had cardiac catheterization  . Edema     EF 55%, echo, October, 2009, possible borderline inferior and posterior hypokinesis... heart catheterization has never been done(July 10, 2009)  . Myositis     Caused pulmonary insufficiency with muscle weakness in the past / Dr.Willis-neurology, Imuran and prednisone  . Calculus of kidney   . Inguinal hernia without mention of obstruction or gangrene, unilateral or unspecified, (not specified as recurrent)     right   . Unspecified gastritis and gastroduodenitis without mention  of hemorrhage   . Abdominal pain, epigastric   . Nonspecific elevation of levels of transaminase or lactic acid dehydrogenase (LDH)   . Feeding difficulties and mismanagement   . Edema of male genital organs     Resolved, occurred during illness related to myositis  . Swelling of arm     left.Marland KitchenMarland KitchenResolved.... no DVT  . LFT elevation     Related to Imuran in the past... dose was adjusted  . Dyslipidemia     statins not  used due to LFT abnormalities  . Shortness of breath     Breathing abnormalities related to muscle weakness from myositis  . RBBB (right bundle branch block)     Old  . Bradycardia     Sinus bradycardia, old  . Ejection fraction     EF 60%, echo, October, 2012  . Mitral regurgitation     Mild / moderate, echo, 2012  . Right ventricular dysfunction     Mild, echo, 2012  . Febrile illness     May, 2013, question sinusitis  . Polymyositis 11/26/2012    PAST SURGICAL HISTORY: Past Surgical History  Procedure Laterality Date  . Splenectomy    . Hernia repair      left side  . Peg placement  06/2007  . Peg tube removal  12/2007  . Kyphosis surgery      multilevel vertebral  . Odontoid fracture surgery      anterior screw fixation  . Nasal sinus surgery      x2  . Cataract extraction      bi lateral    FAMILY HISTORY Family History  Problem Relation Age of Onset  . Breast cancer Sister   . Colon cancer Brother   . Prostate cancer Brother   . Stroke Mother   . Hypertension Mother   . Emphysema Father   . Diabetes Grandchild   . Heart attack Neg Hx   . Cancer Brother   . Cancer Sister   . Cancer Daughter     ADVANCED DIRECTIVES:  Patient does have advance healthcare directive, Patient   does not desire to make any changes  HEALTH MAINTENANCE: History  Substance Use Topics  . Smoking status: Former Smoker -- 0.50 packs/day for 31 years    Types: Cigarettes    Quit date: 06/29/1980  . Smokeless tobacco: Never Used  . Alcohol Use: No     Comment: rarely      Allergies  Allergen Reactions  . Sulfonamide Derivatives     Current Outpatient Prescriptions  Medication Sig Dispense Refill  . ASMANEX 60 METERED DOSES 220 MCG/INH inhaler Inhale 1 puff into the lungs 2 (two) times daily.  0  . aspirin 81 MG tablet Take 81 mg by mouth at bedtime.     . calcium-vitamin D (CALCIUM 500+D) 500-200 MG-UNIT per tablet Take 1 tablet by mouth 2 (two) times daily.      .  Cholecalciferol (VITAMIN D3) 1000 UNITS tablet Take 1,000 Units by mouth daily.      . citalopram (CELEXA) 20 MG tablet Take 1 tablet (20 mg total) by mouth daily. 30 tablet 0  . cyanocobalamin (,VITAMIN B-12,) 1000 MCG/ML injection Inject 1,000 mcg into the muscle every 14 (fourteen) days. At the first of the month    . ferrous sulfate 325 (65 FE) MG tablet Take 325 mg by mouth daily with breakfast.      . fish oil-omega-3 fatty acids 1000 MG capsule Take 1 g by mouth  daily at 12 noon.     . fluconazole (DIFLUCAN) 100 MG tablet Take 1 tablet (100 mg total) by mouth daily. 30 tablet 0  . folic acid (FOLVITE) 1 MG tablet Take 4 tablets (4 mg total) by mouth daily. 120 tablet 0  . furosemide (LASIX) 40 MG tablet Take 1 tablet (40 mg total) by mouth daily. 90 tablet 3  . HYDROcodone-acetaminophen (NORCO/VICODIN) 5-325 MG per tablet Take 1 tablet by mouth every 6 (six) hours as needed for pain.    . montelukast (SINGULAIR) 10 MG tablet Take 10 mg by mouth at bedtime.   11  . Multiple Vitamin (MULTIVITAMIN) capsule Take 1 capsule by mouth daily.      . mycophenolate (CELLCEPT) 500 MG tablet Take 1 tablet (500 mg total) by mouth daily. 90 tablet 3  . nystatin cream (MYCOSTATIN) Apply 1 application topically daily as needed for dry skin.   0  . pantoprazole (PROTONIX) 40 MG tablet Take 1 tablet (40 mg total) by mouth daily at 12 noon. 30 tablet 0  . potassium chloride SA (KLOR-CON M20) 20 MEQ tablet Take 1 tablet (20 mEq total) by mouth daily. Take one tablet by mouth once daily. Take an extra tablet twice a week. (Patient taking differently: Take 20-40 mEq by mouth daily. Take an extra tablet twice a week on Monday and Friday) 114 tablet 3  . predniSONE (DELTASONE) 20 MG tablet Take 4 tablets (80 mg total) by mouth daily with supper. 120 tablet 0  . PROAIR HFA 108 (90 BASE) MCG/ACT inhaler Inhale 2 puffs into the lungs 4 (four) times daily.   11  . Resource Beneprotein PACK Take 3g three times a day    .  temazepam (RESTORIL) 30 MG capsule Take 30 mg by mouth at bedtime.      . triamcinolone cream (KENALOG) 0.1 % Apply 1 application topically daily at 12 noon.   0  . vitamin B-12 (CYANOCOBALAMIN) 1000 MCG tablet Take 1,000 mcg by mouth daily at 12 noon.      No current facility-administered medications for this visit.    OBJECTIVE:  Filed Vitals:   10/16/14 1230  BP: 130/66  Pulse: 61  Temp: 96.1 F (35.6 C)     Body mass index is 21.87 kg/(m^2).    ECOG FS:1 - Symptomatic but completely ambulatory  PHYSICAL EXAM: Gen. status: Patient is alert oriented not any acute distress Lymphatic system: Supraclavicular, cervical, axillary, inguinal lymph nodes are not palpable Head exam was generally normal. There was no scleral icterus or corneal arcus. Mucous membranes were moist. Cardiac: Tachycardia Abdominal exam revealed normal bowel sounds. The abdomen was soft, non-tender, and without masses, organomegaly, or appreciable enlargement of the abdominal aorta. Examination of the skin revealed no evidence of significant rashes, suspicious appearing nevi or other concerning lesions. Neurologically, the patient was awake, alert, and oriented to person, place and time. There were no obvious focal neurologic abnormalities. Lower extremity no edema Kyphoscoliosis: Osteoporosis and multiple vertebral compression fracture   LAB RESULTS:  Appointment on 10/16/2014  Component Date Value Ref Range Status  . WBC 10/16/2014 37.4* 3.8 - 10.6 K/uL Final  . RBC 10/16/2014 3.11* 4.40 - 5.90 MIL/uL Final  . Hemoglobin 10/16/2014 10.5* 13.0 - 18.0 g/dL Final  . HCT 10/16/2014 33.3* 40.0 - 52.0 % Final  . MCV 10/16/2014 106.9* 80.0 - 100.0 fL Final  . MCH 10/16/2014 33.7  26.0 - 34.0 pg Final  . MCHC 10/16/2014 31.5* 32.0 - 36.0 g/dL Final  .  RDW 10/16/2014 27.1* 11.5 - 14.5 % Final  . Platelets 10/16/2014 132* 150 - 440 K/uL Final  . Neutrophils Relative % 10/16/2014 11   Final  . Band Neutrophils  10/16/2014 0  0 - 10 % Final  . Lymphocytes Relative 10/16/2014 80   Final  . Monocytes Relative 10/16/2014 9   Final  . Eosinophils Relative 10/16/2014 0   Final  . Basophils Relative 10/16/2014 0   Final  . WBC Morphology 10/16/2014 SMUDGE CELLS   Final  . RBC Morphology 10/16/2014 HOWELL/JOLLY BODIES   Final   POLYCHROMASIA PRESENT  . nRBC 10/16/2014 2* 0 /100 WBC Final  . Metamyelocytes Relative 10/16/2014 0   Final  . Myelocytes 10/16/2014 0   Final  . Promyelocytes Absolute 10/16/2014 0   Final  . Blasts 10/16/2014 0   Final  . Sodium 10/16/2014 136  135 - 145 mmol/L Final  . Potassium 10/16/2014 4.1  3.5 - 5.1 mmol/L Final  . Chloride 10/16/2014 106  101 - 111 mmol/L Final  . CO2 10/16/2014 29  22 - 32 mmol/L Final  . Glucose, Bld 10/16/2014 86  65 - 99 mg/dL Final  . BUN 10/16/2014 23* 6 - 20 mg/dL Final  . Creatinine, Ser 10/16/2014 0.75  0.61 - 1.24 mg/dL Final  . Calcium 10/16/2014 7.8* 8.9 - 10.3 mg/dL Final  . Total Protein 10/16/2014 6.7  6.5 - 8.1 g/dL Final  . Albumin 10/16/2014 3.8  3.5 - 5.0 g/dL Final  . AST 10/16/2014 40  15 - 41 U/L Final  . ALT 10/16/2014 37  17 - 63 U/L Final  . Alkaline Phosphatase 10/16/2014 107  38 - 126 U/L Final  . Total Bilirubin 10/16/2014 2.0* 0.3 - 1.2 mg/dL Final  . GFR calc non Af Amer 10/16/2014 >60  >60 mL/min Final  . GFR calc Af Amer 10/16/2014 >60  >60 mL/min Final   Comment: (NOTE) The eGFR has been calculated using the CKD EPI equation. This calculation has not been validated in all clinical situations. eGFR's persistently <60 mL/min signify possible Chronic Kidney Disease.   . Anion gap 10/16/2014 1* 5 - 15 Final      STUDIES: Ct Abdomen W Contrast  10/11/2014   CLINICAL DATA:  Current history of hemolytic anemia associated with lymphoproliferative disorder. Prior splenectomy. CT requested to evaluate for accessory splenic tissue.  EXAM: CT ABDOMEN WITH CONTRAST  TECHNIQUE: Multidetector CT imaging of the abdomen was  performed using the standard protocol following bolus administration of intravenous contrast.  CONTRAST:  64m OMNIPAQUE IOHEXOL 300 MG/ML IV. Oral contrast was also administered.  COMPARISON: CT abdomen and pelvis 07/04/2008.  FINDINGS: No evidence of accessory splenic tissue in the splenectomy bed or elsewhere in the abdomen. Scattered calcified granulomata throughout the liver which is moderately enlarged, likely mildly increased in size since the 2010 examination. No significant focal hepatic parenchymal abnormality. Normal pancreas, adrenal glands and right kidney. Approximate 1.2 cm exophytic simple cyst arising from the upper pole of the left kidney, unchanged; no significant abnormality involving the left kidney. Severe aortoiliac atherosclerosis without aneurysm. No significant lymphadenopathy.  Normal-appearing stomach filled with food. Visualized small bowel normal in appearance. Large stool burden throughout the visualized colon. Extensive diverticulosis involving the visualized descending colon without evidence of acute diverticulitis. No ascites.  Bone window images demonstrate severe generalized osseous demineralization with compression fractures of T11, T12, L1, L2, L3, L4 and L5, all with prior augmentation. Visualized lung bases clear apart from scarring deep in  the lower lobes. Heart markedly enlarged.  IMPRESSION: 1. No evidence of accessory splenic tissue in the splenectomy bed or elsewhere in the abdomen. 2. Hepatomegaly, with likely mild increase in size since 2010. No significant focal abnormality involving the liver. 3. Diverticulosis involving the visualized descending colon without evidence of acute diverticulitis.   Electronically Signed   By: Evangeline Dakin M.D.   On: 10/11/2014 13:49   Dg Chest Port 1 View  10/11/2014   CLINICAL DATA:  Shortness of breath for several months worsened over past couple weeks, former smoker, history CLL, diastolic dysfunction, mitral regurgitation,  aortic insufficiency  EXAM: PORTABLE CHEST - 1 VIEW  COMPARISON:  Portable exam 0727 hours compared to 08/21/2014  FINDINGS: Enlargement of cardiac silhouette.  Calcified tortuous aorta.  Mediastinal contours and pulmonary vascularity normal.  Peripheral lucency is seen at the lateral mid RIGHT chest, similar to an earlier exam of 07/16/2008, suspect related to the multiple peripheral RIGHT blebs identified by prior CT chest, perhaps slightly more prominent and conspicuity due to patient rotation to the RIGHT.  No definite pneumothorax identified.  Biapical scarring with minimal subsegmental atelectasis at LEFT base.  Lungs appear emphysematous but otherwise clear.  No pleural effusion or pneumothorax.  Bones demineralized with multiple prior vertebroplasties.  IMPRESSION: COPD changes with peripheral blebs in the RIGHT lung.  Minimal subsegmental atelectasis LEFT base.   Electronically Signed   By: Lavonia Dana M.D.   On: 10/11/2014 08:56    ASSESSMENT: Autoimmune hemolytic anemia secondary to chronic lymphocytic leukemia patient has responded well to previous rituximab therapy Presently responding to prednisone.  MEDICAL DECISION MAKING:  All lab data from the hospitalization from outside institution has been reviewed. Patient had a CT scan which did not reveal any accessory spleen Hemoglobin is improved on prednisone We discussed following options which include rituximab therapy or treating with  IBRUIANIB  Patient cannot be on prednisone for a long duration of time because of previous history of osteoporosis and multiple vertebral compression fracture  Patient and family was present they want to proceed with rituximab as previously Patient had excellent response to that.   Total duration of visit was 45 minutes.  50% or more time was spent in counseling patient and family regarding prognosis and options of treatment and available resources Patient expressed understanding and was in agreement with  this plan. He also understands that He can call clinic at any time with any questions, concerns, or complaints.    No matching staging information was found for the patient.  Forest Gleason, MD   10/17/2014 4:17 PM

## 2014-10-20 ENCOUNTER — Encounter: Payer: Self-pay | Admitting: Oncology

## 2014-10-20 ENCOUNTER — Inpatient Hospital Stay (HOSPITAL_BASED_OUTPATIENT_CLINIC_OR_DEPARTMENT_OTHER): Payer: Medicare Other | Admitting: Oncology

## 2014-10-20 ENCOUNTER — Inpatient Hospital Stay (HOSPITAL_BASED_OUTPATIENT_CLINIC_OR_DEPARTMENT_OTHER): Payer: Medicare Other

## 2014-10-20 ENCOUNTER — Inpatient Hospital Stay: Payer: Medicare Other

## 2014-10-20 ENCOUNTER — Inpatient Hospital Stay: Payer: Self-pay | Admitting: Family Medicine

## 2014-10-20 VITALS — BP 161/70 | HR 62 | Temp 95.8°F | Wt 133.6 lb

## 2014-10-20 DIAGNOSIS — Z7952 Long term (current) use of systemic steroids: Secondary | ICD-10-CM

## 2014-10-20 DIAGNOSIS — Z7982 Long term (current) use of aspirin: Secondary | ICD-10-CM | POA: Diagnosis not present

## 2014-10-20 DIAGNOSIS — Z87442 Personal history of urinary calculi: Secondary | ICD-10-CM

## 2014-10-20 DIAGNOSIS — Z8711 Personal history of peptic ulcer disease: Secondary | ICD-10-CM

## 2014-10-20 DIAGNOSIS — Z803 Family history of malignant neoplasm of breast: Secondary | ICD-10-CM

## 2014-10-20 DIAGNOSIS — E785 Hyperlipidemia, unspecified: Secondary | ICD-10-CM

## 2014-10-20 DIAGNOSIS — C9110 Chronic lymphocytic leukemia of B-cell type not having achieved remission: Secondary | ICD-10-CM

## 2014-10-20 DIAGNOSIS — D591 Other autoimmune hemolytic anemias: Secondary | ICD-10-CM | POA: Diagnosis not present

## 2014-10-20 DIAGNOSIS — Z79899 Other long term (current) drug therapy: Secondary | ICD-10-CM | POA: Diagnosis not present

## 2014-10-20 DIAGNOSIS — I4510 Unspecified right bundle-branch block: Secondary | ICD-10-CM

## 2014-10-20 DIAGNOSIS — R609 Edema, unspecified: Secondary | ICD-10-CM

## 2014-10-20 DIAGNOSIS — Z8781 Personal history of (healed) traumatic fracture: Secondary | ICD-10-CM

## 2014-10-20 DIAGNOSIS — Z800 Family history of malignant neoplasm of digestive organs: Secondary | ICD-10-CM

## 2014-10-20 DIAGNOSIS — J449 Chronic obstructive pulmonary disease, unspecified: Secondary | ICD-10-CM

## 2014-10-20 DIAGNOSIS — Z9081 Acquired absence of spleen: Secondary | ICD-10-CM

## 2014-10-20 DIAGNOSIS — R160 Hepatomegaly, not elsewhere classified: Secondary | ICD-10-CM

## 2014-10-20 DIAGNOSIS — I340 Nonrheumatic mitral (valve) insufficiency: Secondary | ICD-10-CM

## 2014-10-20 DIAGNOSIS — R0602 Shortness of breath: Secondary | ICD-10-CM

## 2014-10-20 DIAGNOSIS — K5730 Diverticulosis of large intestine without perforation or abscess without bleeding: Secondary | ICD-10-CM

## 2014-10-20 DIAGNOSIS — N281 Cyst of kidney, acquired: Secondary | ICD-10-CM

## 2014-10-20 DIAGNOSIS — I351 Nonrheumatic aortic (valve) insufficiency: Secondary | ICD-10-CM

## 2014-10-20 DIAGNOSIS — Z87891 Personal history of nicotine dependence: Secondary | ICD-10-CM

## 2014-10-20 LAB — CBC WITH DIFFERENTIAL/PLATELET
BAND NEUTROPHILS: 2 %
Basophils Absolute: 0 10*3/uL (ref 0–0.1)
Basophils Relative: 0 %
Eosinophils Absolute: 0.4 10*3/uL (ref 0–0.7)
Eosinophils Relative: 1 %
HCT: 33.2 % — ABNORMAL LOW (ref 40.0–52.0)
Hemoglobin: 10.5 g/dL — ABNORMAL LOW (ref 13.0–18.0)
LYMPHS ABS: 30.3 10*3/uL — AB (ref 1.0–3.6)
LYMPHS PCT: 75 %
MCH: 35 pg — AB (ref 26.0–34.0)
MCHC: 31.6 g/dL — ABNORMAL LOW (ref 32.0–36.0)
MCV: 110.6 fL — ABNORMAL HIGH (ref 80.0–100.0)
Monocytes Absolute: 2.8 10*3/uL — ABNORMAL HIGH (ref 0.2–1.0)
Monocytes Relative: 7 %
Neutro Abs: 6.9 10*3/uL — ABNORMAL HIGH (ref 1.4–6.5)
Neutrophils Relative %: 15 %
Platelets: 160 10*3/uL (ref 150–440)
RBC: 3 MIL/uL — AB (ref 4.40–5.90)
RDW: 28.3 % — AB (ref 11.5–14.5)
WBC: 40.4 10*3/uL — AB (ref 3.8–10.6)
nRBC: 3 /100 WBC — ABNORMAL HIGH

## 2014-10-20 LAB — BILIRUBIN, TOTAL: Total Bilirubin: 1.8 mg/dL — ABNORMAL HIGH (ref 0.3–1.2)

## 2014-10-20 LAB — LACTATE DEHYDROGENASE: LDH: 497 U/L — ABNORMAL HIGH (ref 98–192)

## 2014-10-20 MED ORDER — SODIUM CHLORIDE 0.9 % IV SOLN
INTRAVENOUS | Status: DC
  Administered 2014-10-20: 10:00:00 via INTRAVENOUS
  Filled 2014-10-20: qty 1000

## 2014-10-20 MED ORDER — DIPHENHYDRAMINE HCL 50 MG/ML IJ SOLN
6.2500 mg | Freq: Once | INTRAMUSCULAR | Status: AC
  Administered 2014-10-20: 6.5 mg via INTRAVENOUS
  Filled 2014-10-20: qty 1

## 2014-10-20 MED ORDER — SODIUM CHLORIDE 0.9 % IV SOLN
375.0000 mg/m2 | Freq: Once | INTRAVENOUS | Status: AC
  Administered 2014-10-20: 600 mg via INTRAVENOUS
  Filled 2014-10-20: qty 50

## 2014-10-20 NOTE — Progress Notes (Signed)
   10/20/14 1020  Clinical Encounter Type  Visited With Patient  Visit Type Initial  Consult/Referral To Chaplain  Spiritual Encounters  Spiritual Needs Prayer  Provided pastoral presence, support and prayer to patient in cancer center infusion suite.  Pt said "he feels good".  Wished patient well.  [place] [phone]

## 2014-10-20 NOTE — Progress Notes (Signed)
[place] @ [facility] [phone]  [phone]     Maciah Schweigert OB: 11/06/1929  MR#: [id number]  UMP#:[id number]  Patient Care Team: Juline Patch, MD as PCP - General (Family Medicine) Kathrynn Ducking, MD (Neurology) Forest Gleason, MD as Consulting Physician (Unknown Physician Specialty) Carlena Bjornstad, MD as Consulting Physician (Cardiology)  CHIEF COMPLAINT:  Chief Complaint  Patient presents with  . Follow-up    Oncology History   Chief Complaint/Problem List  1.  Autoimmune hemolytic anemia. Previously received Prednisone taper.   2.  Splenectomy in 5/02.  3.  Gastric ulcer diagnosed on endoscopy and multiple biopsies are pending, November 2005. 4.  Chronic lymphocytic leukemia. 5.vitamin B 12 deficiency 6.  Recent admission in the hospital in June of 2016 with hemolytic anemia and hemoglobin of 5.5 patient received blood transfusion was started on steroid 7.  Patient was started on rituximab (October 20, 2014     Chronic lymphocytic leukemia   08/01/2008 Initial Diagnosis Chronic lymphocytic leukemia    Oncology Flowsheet 11/03/2013 11/04/2013 10/11/2014 10/11/2014 10/12/2014 10/13/2014 10/20/2014  cyanocobalamin ((VITAMIN B-12)) IM - - 1,000 mcg   - - -  methylPREDNISolone sodium succinate 40 mg/mL (SOLU-MEDROL) IV 40 mg 40 mg - - - - -  predniSONE (DELTASONE) PO - - 80 mg 80 mg 80 mg 80 mg -  riTUXimab (RITUXAN) IV - - - - - - 375 mg/m2  zolendronic acid (ZOMETA) IV - - 4 mg   - - -    INTERVAL HISTORY:  81 year old gentleman was recently admitted in the hospital with increasing shortness of breath.  Hemoglobin was 5.5.  Patient received blood transfusion patient has autoimmune hemolytic anemia secondary to call chronic lymphocytic leukemia.  Had received Rituxan in the past. Patient was started on prednisone 80 mg daily.  Hemoglobin improved to 10.5 patient is here for ongoing evaluation and treatment consideration October 20, 2014 Patient is here for further  evaluation and has decided to try rituximab.  Hemoglobin is 10.5.  Patient is on 80 mg of prednisone.  Which is now slowly B will be tapered off.  Patient and number of questions which were answered.  REVIEW OF SYSTEMS:    general status: Patient is feeling weak and tired.  No change in a performance status.  No chills.  No fever. HEENT:.  No evidence of stomatitis Lungs: No cough or shortness of breath Cardiac: No chest pain or paroxysmal nocturnal dyspnea GI: No nausea no vomiting no diarrhea no abdominal pain Skin: No rash Lower extremity no swelling Neurological system: No tingling.  No numbness.  No other focal signs Musculoskeletal system no bony pains  As per HPI. Otherwise, a complete review of systems is negatve.  PAST MEDICAL HISTORY: Past Medical History  Diagnosis Date  . CLL (chronic lymphoblastic leukemia) 2006    Rituxan treatment 2006  . Atrial septal aneurysm 2009    Insignificant, no, January, 2009  . Infection of PEG site     cellulitis.Marland KitchenResolved  . Vertebral compression fracture     multiple levels  . Herpes zoster     right chest  . Aortic insufficiency 2009    mild, Mild, echo, 2012  . Diastolic dysfunction     mild  . Chest pain     EF 55%, echo, October, 2009, possible inferior and posterior borderline hypokinesis, patient has never  had cardiac catheterization  . Edema     EF 55%, echo, October, 2009, possible borderline inferior and posterior hypokinesis.Marland KitchenMarland Kitchen  heart catheterization has never been done(July 10, 2009)  . Myositis     Caused pulmonary insufficiency with muscle weakness in the past / Dr.Willis-neurology, Imuran and prednisone  . Calculus of kidney   . Inguinal hernia without mention of obstruction or gangrene, unilateral or unspecified, (not specified as recurrent)     right   . Unspecified gastritis and gastroduodenitis without mention of hemorrhage   . Abdominal pain, epigastric   . Nonspecific elevation of levels of transaminase or  lactic acid dehydrogenase (LDH)   . Feeding difficulties and mismanagement   . Edema of male genital organs     Resolved, occurred during illness related to myositis  . Swelling of arm     left.Marland KitchenMarland KitchenResolved.... no DVT  . LFT elevation     Related to Imuran in the past... dose was adjusted  . Dyslipidemia     statins not used due to LFT abnormalities  . Shortness of breath     Breathing abnormalities related to muscle weakness from myositis  . RBBB (right bundle branch block)     Old  . Bradycardia     Sinus bradycardia, old  . Ejection fraction     EF 60%, echo, October, 2012  . Mitral regurgitation     Mild / moderate, echo, 2012  . Right ventricular dysfunction     Mild, echo, 2012  . Febrile illness     May, 2013, question sinusitis  . Polymyositis 11/26/2012    PAST SURGICAL HISTORY: Past Surgical History  Procedure Laterality Date  . Splenectomy    . Hernia repair      left side  . Peg placement  06/2007  . Peg tube removal  12/2007  . Kyphosis surgery      multilevel vertebral  . Odontoid fracture surgery      anterior screw fixation  . Nasal sinus surgery      x2  . Cataract extraction      bi lateral    FAMILY HISTORY Family History  Problem Relation Age of Onset  . Breast cancer Sister   . Colon cancer Brother   . Prostate cancer Brother   . Stroke Mother   . Hypertension Mother   . Emphysema Father   . Diabetes Grandchild   . Heart attack Neg Hx   . Cancer Brother   . Cancer Sister   . Cancer Daughter     ADVANCED DIRECTIVES:  Patient does have advance healthcare directive, Patient   does not desire to make any changes  HEALTH MAINTENANCE: History  Substance Use Topics  . Smoking status: Former Smoker -- 0.50 packs/day for 31 years    Types: Cigarettes    Quit date: 06/29/1980  . Smokeless tobacco: Never Used  . Alcohol Use: No     Comment: rarely      Allergies  Allergen Reactions  . Sulfonamide Derivatives     Current Outpatient  Prescriptions  Medication Sig Dispense Refill  . ASMANEX 60 METERED DOSES 220 MCG/INH inhaler Inhale 1 puff into the lungs 2 (two) times daily.  0  . aspirin 81 MG tablet Take 81 mg by mouth at bedtime.     . calcium-vitamin D (CALCIUM 500+D) 500-200 MG-UNIT per tablet Take 1 tablet by mouth 2 (two) times daily.      . Cholecalciferol (VITAMIN D3) 1000 UNITS tablet Take 1,000 Units by mouth daily.      . citalopram (CELEXA) 20 MG tablet Take 1 tablet (20 mg total) by  mouth daily. 30 tablet 0  . cyanocobalamin (,VITAMIN B-12,) 1000 MCG/ML injection Inject 1,000 mcg into the muscle every 14 (fourteen) days. At the first of the month    . ferrous sulfate 325 (65 FE) MG tablet Take 325 mg by mouth daily with breakfast.      . fish oil-omega-3 fatty acids 1000 MG capsule Take 1 g by mouth daily at 12 noon.     . fluconazole (DIFLUCAN) 100 MG tablet Take 1 tablet (100 mg total) by mouth daily. 30 tablet 0  . folic acid (FOLVITE) 1 MG tablet Take 4 tablets (4 mg total) by mouth daily. 120 tablet 0  . furosemide (LASIX) 40 MG tablet Take 1 tablet (40 mg total) by mouth daily. 90 tablet 3  . HYDROcodone-acetaminophen (NORCO/VICODIN) 5-325 MG per tablet Take 1 tablet by mouth every 6 (six) hours as needed for pain.    . montelukast (SINGULAIR) 10 MG tablet Take 10 mg by mouth at bedtime.   11  . Multiple Vitamin (MULTIVITAMIN) capsule Take 1 capsule by mouth daily.      . mycophenolate (CELLCEPT) 500 MG tablet Take 1 tablet (500 mg total) by mouth daily. 90 tablet 3  . nystatin cream (MYCOSTATIN) Apply 1 application topically daily as needed for dry skin.   0  . pantoprazole (PROTONIX) 40 MG tablet Take 1 tablet (40 mg total) by mouth daily at 12 noon. 30 tablet 0  . potassium chloride SA (KLOR-CON M20) 20 MEQ tablet Take 1 tablet (20 mEq total) by mouth daily. Take one tablet by mouth once daily. Take an extra tablet twice a week. (Patient taking differently: Take 20-40 mEq by mouth daily. Take an extra  tablet twice a week on Monday and Friday) 114 tablet 3  . predniSONE (DELTASONE) 20 MG tablet Take 4 tablets (80 mg total) by mouth daily with supper. 120 tablet 0  . PROAIR HFA 108 (90 BASE) MCG/ACT inhaler Inhale 2 puffs into the lungs 4 (four) times daily.   11  . Resource Beneprotein PACK Take 3g three times a day    . temazepam (RESTORIL) 30 MG capsule Take 30 mg by mouth at bedtime.      . triamcinolone cream (KENALOG) 0.1 % Apply 1 application topically daily at 12 noon.   0  . vitamin B-12 (CYANOCOBALAMIN) 1000 MCG tablet Take 1,000 mcg by mouth daily at 12 noon.      No current facility-administered medications for this visit.   Facility-Administered Medications Ordered in Other Visits  Medication Dose Route Frequency Provider Last Rate Last Dose  . 0.9 %  sodium chloride infusion   Intravenous Continuous Forest Gleason, MD 30 mL/hr at 10/20/14 1000      OBJECTIVE:  Filed Vitals:   10/20/14 0859  BP: 161/70  Pulse: 62  Temp: 95.8 F (35.4 C)     Body mass index is 22.23 kg/(m^2).    ECOG FS:1 - Symptomatic but completely ambulatory  PHYSICAL EXAM: Gen. status: Patient is alert oriented not any acute distress Lymphatic system: Supraclavicular, cervical, axillary, inguinal lymph nodes are not palpable Head exam was generally normal. There was no scleral icterus or corneal arcus. Mucous membranes were moist. Cardiac: Tachycardia Abdominal exam revealed normal bowel sounds. The abdomen was soft, non-tender, and without masses, organomegaly, or appreciable enlargement of the abdominal aorta. Examination of the skin revealed no evidence of significant rashes, suspicious appearing nevi or other concerning lesions. Neurologically, the patient was awake, alert, and oriented to person, place  and time. There were no obvious focal neurologic abnormalities. Lower extremity no edema Kyphoscoliosis: Osteoporosis and multiple vertebral compression fracture   LAB RESULTS:  Appointment on  10/20/2014  Component Date Value Ref Range Status  . WBC 10/20/2014 40.4* 3.8 - 10.6 K/uL Final  . RBC 10/20/2014 3.00* 4.40 - 5.90 MIL/uL Final  . Hemoglobin 10/20/2014 10.5* 13.0 - 18.0 g/dL Final  . HCT 10/20/2014 33.2* 40.0 - 52.0 % Final  . MCV 10/20/2014 110.6* 80.0 - 100.0 fL Final  . MCH 10/20/2014 35.0* 26.0 - 34.0 pg Final  . MCHC 10/20/2014 31.6* 32.0 - 36.0 g/dL Final  . RDW 10/20/2014 28.3* 11.5 - 14.5 % Final  . Platelets 10/20/2014 160  150 - 440 K/uL Final  . Neutrophils Relative % 10/20/2014 15   Final  . Lymphocytes Relative 10/20/2014 75   Final  . Monocytes Relative 10/20/2014 7   Final  . Eosinophils Relative 10/20/2014 1   Final  . Basophils Relative 10/20/2014 0   Final  . Band Neutrophils 10/20/2014 2   Final  . nRBC 10/20/2014 3* 0 /100 WBC Final  . Neutro Abs 10/20/2014 6.9* 1.4 - 6.5 K/uL Final  . Lymphs Abs 10/20/2014 30.3* 1.0 - 3.6 K/uL Final  . Monocytes Absolute 10/20/2014 2.8* 0.2 - 1.0 K/uL Final  . Eosinophils Absolute 10/20/2014 0.4  0 - 0.7 K/uL Final  . Basophils Absolute 10/20/2014 0.0  0 - 0.1 K/uL Final  . RBC Morphology 10/20/2014 MIXED RBC POPULATION   Final   Comment: POLYCHROMASIA PRESENT BASOPHILIC STIPPLING   . WBC Morphology 10/20/2014 ATYPICAL LYMPHOCYTES   Final   SMUDGE CELLS  . Smear Review 10/20/2014 GIANT PLATELETS SEEN   Final  . LDH 10/20/2014 497* 98 - 192 U/L Final  . Total Bilirubin 10/20/2014 1.8* 0.3 - 1.2 mg/dL Final      STUDIES: Ct Abdomen W Contrast  10/11/2014   CLINICAL DATA:  Current history of hemolytic anemia associated with lymphoproliferative disorder. Prior splenectomy. CT requested to evaluate for accessory splenic tissue.  EXAM: CT ABDOMEN WITH CONTRAST  TECHNIQUE: Multidetector CT imaging of the abdomen was performed using the standard protocol following bolus administration of intravenous contrast.  CONTRAST:  33mL OMNIPAQUE IOHEXOL 300 MG/ML IV. Oral contrast was also administered.  COMPARISON: CT  abdomen and pelvis 07/04/2008.  FINDINGS: No evidence of accessory splenic tissue in the splenectomy bed or elsewhere in the abdomen. Scattered calcified granulomata throughout the liver which is moderately enlarged, likely mildly increased in size since the 2010 examination. No significant focal hepatic parenchymal abnormality. Normal pancreas, adrenal glands and right kidney. Approximate 1.2 cm exophytic simple cyst arising from the upper pole of the left kidney, unchanged; no significant abnormality involving the left kidney. Severe aortoiliac atherosclerosis without aneurysm. No significant lymphadenopathy.  Normal-appearing stomach filled with food. Visualized small bowel normal in appearance. Large stool burden throughout the visualized colon. Extensive diverticulosis involving the visualized descending colon without evidence of acute diverticulitis. No ascites.  Bone window images demonstrate severe generalized osseous demineralization with compression fractures of T11, T12, L1, L2, L3, L4 and L5, all with prior augmentation. Visualized lung bases clear apart from scarring deep in the lower lobes. Heart markedly enlarged.  IMPRESSION: 1. No evidence of accessory splenic tissue in the splenectomy bed or elsewhere in the abdomen. 2. Hepatomegaly, with likely mild increase in size since 2010. No significant focal abnormality involving the liver. 3. Diverticulosis involving the visualized descending colon without evidence of acute diverticulitis.   Electronically  Signed   By: Evangeline Dakin M.D.   On: 10/11/2014 13:49   Dg Chest Port 1 View  10/11/2014   CLINICAL DATA:  Shortness of breath for several months worsened over past couple weeks, former smoker, history CLL, diastolic dysfunction, mitral regurgitation, aortic insufficiency  EXAM: PORTABLE CHEST - 1 VIEW  COMPARISON:  Portable exam 0727 hours compared to 08/21/2014  FINDINGS: Enlargement of cardiac silhouette.  Calcified tortuous aorta.  Mediastinal  contours and pulmonary vascularity normal.  Peripheral lucency is seen at the lateral mid RIGHT chest, similar to an earlier exam of 07/16/2008, suspect related to the multiple peripheral RIGHT blebs identified by prior CT chest, perhaps slightly more prominent and conspicuity due to patient rotation to the RIGHT.  No definite pneumothorax identified.  Biapical scarring with minimal subsegmental atelectasis at LEFT base.  Lungs appear emphysematous but otherwise clear.  No pleural effusion or pneumothorax.  Bones demineralized with multiple prior vertebroplasties.  IMPRESSION: COPD changes with peripheral blebs in the RIGHT lung.  Minimal subsegmental atelectasis LEFT base.   Electronically Signed   By: Lavonia Dana M.D.   On: 10/11/2014 08:56    ASSESSMENT: Autoimmune hemolytic anemia secondary to chronic lymphocytic leukemia patient has responded well to previous rituximab therapy Presently responding to prednisone. Patient was start 60 mg of prednisone until next week Rituximab would be started today  MEDICAL DECISION MAKING:  All lab data from the hospitalization from outside institution has been reviewed. Patient had a CT scan which did not reveal any accessory spleen Hemoglobin is improved on prednisone Consent was obtained for rituximab All the questions were answered     No matching staging information was found for the patient.  Forest Gleason, MD   10/20/2014 1:15 PM

## 2014-10-20 NOTE — Progress Notes (Signed)
Patient does have living will.  Former smoker. 

## 2014-10-23 ENCOUNTER — Inpatient Hospital Stay: Payer: Self-pay | Admitting: Family Medicine

## 2014-10-27 ENCOUNTER — Inpatient Hospital Stay (HOSPITAL_BASED_OUTPATIENT_CLINIC_OR_DEPARTMENT_OTHER): Payer: Medicare Other | Admitting: Oncology

## 2014-10-27 ENCOUNTER — Ambulatory Visit: Payer: Medicare Other | Admitting: Oncology

## 2014-10-27 ENCOUNTER — Inpatient Hospital Stay: Payer: Medicare Other

## 2014-10-27 ENCOUNTER — Other Ambulatory Visit: Payer: Medicare Other

## 2014-10-27 VITALS — BP 146/57 | HR 55 | Temp 97.1°F | Resp 18

## 2014-10-27 VITALS — BP 176/82 | HR 51 | Temp 96.6°F | Wt 130.1 lb

## 2014-10-27 DIAGNOSIS — D594 Other nonautoimmune hemolytic anemias: Secondary | ICD-10-CM

## 2014-10-27 DIAGNOSIS — C9110 Chronic lymphocytic leukemia of B-cell type not having achieved remission: Secondary | ICD-10-CM | POA: Diagnosis not present

## 2014-10-27 DIAGNOSIS — Z9081 Acquired absence of spleen: Secondary | ICD-10-CM

## 2014-10-27 DIAGNOSIS — I4510 Unspecified right bundle-branch block: Secondary | ICD-10-CM

## 2014-10-27 DIAGNOSIS — R0602 Shortness of breath: Secondary | ICD-10-CM

## 2014-10-27 DIAGNOSIS — D591 Other autoimmune hemolytic anemias: Secondary | ICD-10-CM

## 2014-10-27 DIAGNOSIS — Z79899 Other long term (current) drug therapy: Secondary | ICD-10-CM

## 2014-10-27 DIAGNOSIS — Z87442 Personal history of urinary calculi: Secondary | ICD-10-CM

## 2014-10-27 DIAGNOSIS — E785 Hyperlipidemia, unspecified: Secondary | ICD-10-CM

## 2014-10-27 DIAGNOSIS — R160 Hepatomegaly, not elsewhere classified: Secondary | ICD-10-CM

## 2014-10-27 DIAGNOSIS — Z8781 Personal history of (healed) traumatic fracture: Secondary | ICD-10-CM

## 2014-10-27 DIAGNOSIS — K5730 Diverticulosis of large intestine without perforation or abscess without bleeding: Secondary | ICD-10-CM

## 2014-10-27 DIAGNOSIS — Z800 Family history of malignant neoplasm of digestive organs: Secondary | ICD-10-CM

## 2014-10-27 DIAGNOSIS — Z7982 Long term (current) use of aspirin: Secondary | ICD-10-CM | POA: Diagnosis not present

## 2014-10-27 DIAGNOSIS — I340 Nonrheumatic mitral (valve) insufficiency: Secondary | ICD-10-CM

## 2014-10-27 DIAGNOSIS — I351 Nonrheumatic aortic (valve) insufficiency: Secondary | ICD-10-CM

## 2014-10-27 DIAGNOSIS — Z87891 Personal history of nicotine dependence: Secondary | ICD-10-CM

## 2014-10-27 DIAGNOSIS — J449 Chronic obstructive pulmonary disease, unspecified: Secondary | ICD-10-CM

## 2014-10-27 DIAGNOSIS — Z803 Family history of malignant neoplasm of breast: Secondary | ICD-10-CM

## 2014-10-27 DIAGNOSIS — Z8711 Personal history of peptic ulcer disease: Secondary | ICD-10-CM

## 2014-10-27 DIAGNOSIS — D479 Neoplasm of uncertain behavior of lymphoid, hematopoietic and related tissue, unspecified: Secondary | ICD-10-CM

## 2014-10-27 DIAGNOSIS — Z7952 Long term (current) use of systemic steroids: Secondary | ICD-10-CM

## 2014-10-27 DIAGNOSIS — N281 Cyst of kidney, acquired: Secondary | ICD-10-CM

## 2014-10-27 DIAGNOSIS — R609 Edema, unspecified: Secondary | ICD-10-CM

## 2014-10-27 LAB — CBC WITH DIFFERENTIAL/PLATELET
BASOS ABS: 0 10*3/uL (ref 0–0.1)
Basophils Relative: 0 %
EOS ABS: 0 10*3/uL (ref 0–0.7)
Eosinophils Relative: 0 %
HCT: 33.9 % — ABNORMAL LOW (ref 40.0–52.0)
HEMOGLOBIN: 11.2 g/dL — AB (ref 13.0–18.0)
Lymphs Abs: 3 10*3/uL (ref 1.0–3.6)
MCH: 36.2 pg — AB (ref 26.0–34.0)
MCHC: 33 g/dL (ref 32.0–36.0)
MCV: 109.7 fL — ABNORMAL HIGH (ref 80.0–100.0)
Monocytes Absolute: 0.7 10*3/uL (ref 0.2–1.0)
Neutro Abs: 3.4 10*3/uL (ref 1.4–6.5)
Platelets: 160 10*3/uL (ref 150–440)
RBC: 3.09 MIL/uL — ABNORMAL LOW (ref 4.40–5.90)
RDW: 25 % — AB (ref 11.5–14.5)
WBC: 7.2 10*3/uL (ref 3.8–10.6)

## 2014-10-27 MED ORDER — DIPHENHYDRAMINE HCL 50 MG/ML IJ SOLN
6.2500 mg | Freq: Once | INTRAMUSCULAR | Status: AC
Start: 2014-10-27 — End: 2014-10-27
  Administered 2014-10-27: 6.5 mg via INTRAVENOUS
  Filled 2014-10-27: qty 1

## 2014-10-27 MED ORDER — SODIUM CHLORIDE 0.9 % IV SOLN: 375.0000 mg/m2 | Freq: Once | INTRAVENOUS | Status: DC

## 2014-10-27 MED ORDER — ACETAMINOPHEN 325 MG PO TABS
650.0000 mg | ORAL_TABLET | Freq: Once | ORAL | Status: AC
  Administered 2014-10-27: 650 mg via ORAL
  Filled 2014-10-27: qty 2

## 2014-10-27 MED ORDER — SODIUM CHLORIDE 0.9 % IV SOLN
375.0000 mg/m2 | Freq: Once | INTRAVENOUS | Status: AC
  Administered 2014-10-27: 600 mg via INTRAVENOUS
  Filled 2014-10-27: qty 50

## 2014-10-27 MED ORDER — CYANOCOBALAMIN 1000 MCG/ML IJ SOLN
1000.0000 ug | Freq: Once | INTRAMUSCULAR | Status: AC
  Administered 2014-10-27: 1000 ug via INTRAMUSCULAR
  Filled 2014-10-27: qty 1

## 2014-10-27 MED ORDER — PREDNISONE 20 MG PO TABS: 40.0000 mg | ORAL_TABLET | Freq: Every day | ORAL | Status: DC

## 2014-10-27 MED ORDER — SODIUM CHLORIDE 0.9 % IV SOLN
INTRAVENOUS | Status: DC
  Administered 2014-10-27: 10:00:00 via INTRAVENOUS
  Filled 2014-10-27: qty 1000

## 2014-10-27 NOTE — Progress Notes (Signed)
Patient does have living will.  Former smoker. 

## 2014-11-01 ENCOUNTER — Encounter: Payer: Self-pay | Admitting: Oncology

## 2014-11-01 NOTE — Progress Notes (Signed)
[place] @ [facility] [phone]) [phone]  [phone]     Eligh Rybacki OB: 07/11/1929  MR#: [id number]  AOZ#:[id number]  Patient Care Team: Juline Patch, MD as PCP - General (Family Medicine) Kathrynn Ducking, MD (Neurology) Forest Gleason, MD as Consulting Physician (Unknown Physician Specialty) Carlena Bjornstad, MD as Consulting Physician (Cardiology)  CHIEF COMPLAINT:  Chief Complaint  Patient presents with  . Follow-up    Oncology History   Chief Complaint/Problem List  1.  Autoimmune hemolytic anemia. Previously received Prednisone taper.   2.  Splenectomy in 5/02.  3.  Gastric ulcer diagnosed on endoscopy and multiple biopsies are pending, November 2005. 4.  Chronic lymphocytic leukemia. 5.vitamin B 12 deficiency 6.  Recent admission in the hospital in June of 2016 with hemolytic anemia and hemoglobin of 5.5 patient received blood transfusion was started on steroid 7.  Patient was started on rituximab (October 20, 2014     Chronic lymphocytic leukemia   08/01/2008 Initial Diagnosis Chronic lymphocytic leukemia    Oncology Flowsheet 11/04/2013 10/11/2014 10/11/2014 10/12/2014 10/13/2014 10/20/2014 10/27/2014  cyanocobalamin ((VITAMIN B-12)) IM - 1,000 mcg   - - - 1,000 mcg  methylPREDNISolone sodium succinate 40 mg/mL (SOLU-MEDROL) IV 40 mg - - - - - -  predniSONE (DELTASONE) PO - 80 mg 80 mg 80 mg 80 mg - -  riTUXimab (RITUXAN) IV - - - - - 375 mg/m2 375 mg/m2  zolendronic acid (ZOMETA) IV - 4 mg   - - - -    INTERVAL HISTORY:  81 year old gentleman was recently admitted in the hospital with increasing shortness of breath.  Hemoglobin was 5.5.  Patient received blood transfusion patient has autoimmune hemolytic anemia secondary to call chronic lymphocytic leukemia.  Had received Rituxan in the past. Patient was started on prednisone 80 mg daily.  Hemoglobin improved to 10.5 patient is here for ongoing evaluation and treatment consideration October 20, 2014 Patient is here  for further evaluation and has decided to try rituximab.  Hemoglobin is 10.5.  Patient is on 80 mg of prednisone.  Which is now slowly B will be tapered off.  Patient and number of questions which were answered. In October 27, 2014 Here for continuation of rituximab therapy for chronic lymphocytic leukemia and autoimmune hemolytic anemia.  Hemoglobin is improved to 11.2.  Patient's prednisone has been tapered off to 40 mg by mouth daily  REVIEW OF SYSTEMS:    general status: Patient is feeling weak and tired.  No change in a performance status.  No chills.  No fever. HEENT:.  No evidence of stomatitis Lungs: No cough or shortness of breath Cardiac: No chest pain or paroxysmal nocturnal dyspnea GI: No nausea no vomiting no diarrhea no abdominal pain Skin: No rash Lower extremity no swelling Neurological system: No tingling.  No numbness.  No other focal signs Musculoskeletal system no bony pains  As per HPI. Otherwise, a complete review of systems is negatve.  PAST MEDICAL HISTORY: Past Medical History  Diagnosis Date  . CLL (chronic lymphoblastic leukemia) 2006    Rituxan treatment 2006  . Atrial septal aneurysm 2009    Insignificant, no, January, 2009  . Infection of PEG site     cellulitis.Marland KitchenResolved  . Vertebral compression fracture     multiple levels  . Herpes zoster     right chest  . Aortic insufficiency 2009    mild, Mild, echo, 2012  . Diastolic dysfunction     mild  . Chest pain  EF 55%, echo, October, 2009, possible inferior and posterior borderline hypokinesis, patient has never  had cardiac catheterization  . Edema     EF 55%, echo, October, 2009, possible borderline inferior and posterior hypokinesis... heart catheterization has never been done(July 10, 2009)  . Myositis     Caused pulmonary insufficiency with muscle weakness in the past / Dr.Willis-neurology, Imuran and prednisone  . Calculus of kidney   . Inguinal hernia without mention of obstruction or  gangrene, unilateral or unspecified, (not specified as recurrent)     right   . Unspecified gastritis and gastroduodenitis without mention of hemorrhage   . Abdominal pain, epigastric   . Nonspecific elevation of levels of transaminase or lactic acid dehydrogenase (LDH)   . Feeding difficulties and mismanagement   . Edema of male genital organs     Resolved, occurred during illness related to myositis  . Swelling of arm     left.Marland KitchenMarland KitchenResolved.... no DVT  . LFT elevation     Related to Imuran in the past... dose was adjusted  . Dyslipidemia     statins not used due to LFT abnormalities  . Shortness of breath     Breathing abnormalities related to muscle weakness from myositis  . RBBB (right bundle branch block)     Old  . Bradycardia     Sinus bradycardia, old  . Ejection fraction     EF 60%, echo, October, 2012  . Mitral regurgitation     Mild / moderate, echo, 2012  . Right ventricular dysfunction     Mild, echo, 2012  . Febrile illness     May, 2013, question sinusitis  . Polymyositis 11/26/2012    PAST SURGICAL HISTORY: Past Surgical History  Procedure Laterality Date  . Splenectomy    . Hernia repair      left side  . Peg placement  06/2007  . Peg tube removal  12/2007  . Kyphosis surgery      multilevel vertebral  . Odontoid fracture surgery      anterior screw fixation  . Nasal sinus surgery      x2  . Cataract extraction      bi lateral    FAMILY HISTORY Family History  Problem Relation Age of Onset  . Breast cancer Sister   . Colon cancer Brother   . Prostate cancer Brother   . Stroke Mother   . Hypertension Mother   . Emphysema Father   . Diabetes Grandchild   . Heart attack Neg Hx   . Cancer Brother   . Cancer Sister   . Cancer Daughter     ADVANCED DIRECTIVES:  Patient does have advance healthcare directive, Patient   does not desire to make any changes  HEALTH MAINTENANCE: History  Substance Use Topics  . Smoking status: Former Smoker --  0.50 packs/day for 31 years    Types: Cigarettes    Quit date: 06/29/1980  . Smokeless tobacco: Never Used  . Alcohol Use: No     Comment: rarely      Allergies  Allergen Reactions  . Sulfonamide Derivatives     Current Outpatient Prescriptions  Medication Sig Dispense Refill  . ASMANEX 60 METERED DOSES 220 MCG/INH inhaler Inhale 1 puff into the lungs 2 (two) times daily.  0  . aspirin 81 MG tablet Take 81 mg by mouth at bedtime.     . calcium-vitamin D (CALCIUM 500+D) 500-200 MG-UNIT per tablet Take 1 tablet by mouth 2 (two) times daily.      Marland Kitchen  Cholecalciferol (VITAMIN D3) 1000 UNITS tablet Take 1,000 Units by mouth daily.      . citalopram (CELEXA) 20 MG tablet Take 1 tablet (20 mg total) by mouth daily. 30 tablet 0  . cyanocobalamin (,VITAMIN B-12,) 1000 MCG/ML injection Inject 1,000 mcg into the muscle every 14 (fourteen) days. At the first of the month    . ferrous sulfate 325 (65 FE) MG tablet Take 325 mg by mouth daily with breakfast.      . fish oil-omega-3 fatty acids 1000 MG capsule Take 1 g by mouth daily at 12 noon.     . fluconazole (DIFLUCAN) 100 MG tablet Take 1 tablet (100 mg total) by mouth daily. 30 tablet 0  . folic acid (FOLVITE) 1 MG tablet Take 4 tablets (4 mg total) by mouth daily. 120 tablet 0  . furosemide (LASIX) 40 MG tablet Take 1 tablet (40 mg total) by mouth daily. 90 tablet 3  . HYDROcodone-acetaminophen (NORCO/VICODIN) 5-325 MG per tablet Take 1 tablet by mouth every 6 (six) hours as needed for pain.    . montelukast (SINGULAIR) 10 MG tablet Take 10 mg by mouth at bedtime.   11  . Multiple Vitamin (MULTIVITAMIN) capsule Take 1 capsule by mouth daily.      . mycophenolate (CELLCEPT) 500 MG tablet Take 1 tablet (500 mg total) by mouth daily. 90 tablet 3  . nystatin cream (MYCOSTATIN) Apply 1 application topically daily as needed for dry skin.   0  . pantoprazole (PROTONIX) 40 MG tablet Take 1 tablet (40 mg total) by mouth daily at 12 noon. 30 tablet 0  .  potassium chloride SA (KLOR-CON M20) 20 MEQ tablet Take 1 tablet (20 mEq total) by mouth daily. Take one tablet by mouth once daily. Take an extra tablet twice a week. (Patient taking differently: Take 20-40 mEq by mouth daily. Take an extra tablet twice a week on Monday and Friday) 114 tablet 3  . predniSONE (DELTASONE) 20 MG tablet Take 2 tablets (40 mg total) by mouth daily with supper. 120 tablet 0  . PROAIR HFA 108 (90 BASE) MCG/ACT inhaler Inhale 2 puffs into the lungs 4 (four) times daily.   11  . Resource Beneprotein PACK Take 3g three times a day    . temazepam (RESTORIL) 30 MG capsule Take 30 mg by mouth at bedtime.      . triamcinolone cream (KENALOG) 0.1 % Apply 1 application topically daily at 12 noon.   0  . vitamin B-12 (CYANOCOBALAMIN) 1000 MCG tablet Take 1,000 mcg by mouth daily at 12 noon.      No current facility-administered medications for this visit.    OBJECTIVE:  Filed Vitals:   10/27/14 0924  BP: 176/82  Pulse: 51  Temp: 96.6 F (35.9 C)     Body mass index is 21.65 kg/(m^2).    ECOG FS:1 - Symptomatic but completely ambulatory  PHYSICAL EXAM: Gen. status: Patient is alert oriented not any acute distress Lymphatic system: Supraclavicular, cervical, axillary, inguinal lymph nodes are not palpable Head exam was generally normal. There was no scleral icterus or corneal arcus. Mucous membranes were moist. Cardiac: Tachycardia Abdominal exam revealed normal bowel sounds. The abdomen was soft, non-tender, and without masses, organomegaly, or appreciable enlargement of the abdominal aorta. Examination of the skin revealed no evidence of significant rashes, suspicious appearing nevi or other concerning lesions. Neurologically, the patient was awake, alert, and oriented to person, place and time. There were no obvious focal neurologic abnormalities. Lower  extremity no edema Kyphoscoliosis: Osteoporosis and multiple vertebral compression fracture   LAB  RESULTS:  Appointment on 10/27/2014  Component Date Value Ref Range Status  . WBC 10/27/2014 7.2  3.8 - 10.6 K/uL Final  . RBC 10/27/2014 3.09* 4.40 - 5.90 MIL/uL Final  . Hemoglobin 10/27/2014 11.2* 13.0 - 18.0 g/dL Final  . HCT 10/27/2014 33.9* 40.0 - 52.0 % Final  . MCV 10/27/2014 109.7* 80.0 - 100.0 fL Final  . MCH 10/27/2014 36.2* 26.0 - 34.0 pg Final  . MCHC 10/27/2014 33.0  32.0 - 36.0 g/dL Final  . RDW 10/27/2014 25.0* 11.5 - 14.5 % Final  . Platelets 10/27/2014 160  150 - 440 K/uL Final  . Neutrophils Relative % 10/27/2014 48%   Final  . Neutro Abs 10/27/2014 3.4  1.4 - 6.5 K/uL Final  . Lymphocytes Relative 10/27/2014 42%   Final  . Lymphs Abs 10/27/2014 3.0  1.0 - 3.6 K/uL Final  . Monocytes Relative 10/27/2014 10%   Final  . Monocytes Absolute 10/27/2014 0.7  0.2 - 1.0 K/uL Final  . Eosinophils Relative 10/27/2014 0%   Final  . Eosinophils Absolute 10/27/2014 0.0  0 - 0.7 K/uL Final  . Basophils Relative 10/27/2014 0%   Final  . Basophils Absolute 10/27/2014 0.0  0 - 0.1 K/uL Final  . Smear Review 10/27/2014 RARE NRBCs   Corrected      STUDIES: Ct Abdomen W Contrast  10/11/2014   CLINICAL DATA:  Current history of hemolytic anemia associated with lymphoproliferative disorder. Prior splenectomy. CT requested to evaluate for accessory splenic tissue.  EXAM: CT ABDOMEN WITH CONTRAST  TECHNIQUE: Multidetector CT imaging of the abdomen was performed using the standard protocol following bolus administration of intravenous contrast.  CONTRAST:  90mL OMNIPAQUE IOHEXOL 300 MG/ML IV. Oral contrast was also administered.  COMPARISON: CT abdomen and pelvis 07/04/2008.  FINDINGS: No evidence of accessory splenic tissue in the splenectomy bed or elsewhere in the abdomen. Scattered calcified granulomata throughout the liver which is moderately enlarged, likely mildly increased in size since the 2010 examination. No significant focal hepatic parenchymal abnormality. Normal pancreas, adrenal  glands and right kidney. Approximate 1.2 cm exophytic simple cyst arising from the upper pole of the left kidney, unchanged; no significant abnormality involving the left kidney. Severe aortoiliac atherosclerosis without aneurysm. No significant lymphadenopathy.  Normal-appearing stomach filled with food. Visualized small bowel normal in appearance. Large stool burden throughout the visualized colon. Extensive diverticulosis involving the visualized descending colon without evidence of acute diverticulitis. No ascites.  Bone window images demonstrate severe generalized osseous demineralization with compression fractures of T11, T12, L1, L2, L3, L4 and L5, all with prior augmentation. Visualized lung bases clear apart from scarring deep in the lower lobes. Heart markedly enlarged.  IMPRESSION: 1. No evidence of accessory splenic tissue in the splenectomy bed or elsewhere in the abdomen. 2. Hepatomegaly, with likely mild increase in size since 2010. No significant focal abnormality involving the liver. 3. Diverticulosis involving the visualized descending colon without evidence of acute diverticulitis.   Electronically Signed   By: Evangeline Dakin M.D.   On: 10/11/2014 13:49   Dg Chest Port 1 View  10/11/2014   CLINICAL DATA:  Shortness of breath for several months worsened over past couple weeks, former smoker, history CLL, diastolic dysfunction, mitral regurgitation, aortic insufficiency  EXAM: PORTABLE CHEST - 1 VIEW  COMPARISON:  Portable exam 0727 hours compared to 08/21/2014  FINDINGS: Enlargement of cardiac silhouette.  Calcified tortuous aorta.  Mediastinal contours  and pulmonary vascularity normal.  Peripheral lucency is seen at the lateral mid RIGHT chest, similar to an earlier exam of 07/16/2008, suspect related to the multiple peripheral RIGHT blebs identified by prior CT chest, perhaps slightly more prominent and conspicuity due to patient rotation to the RIGHT.  No definite pneumothorax identified.   Biapical scarring with minimal subsegmental atelectasis at LEFT base.  Lungs appear emphysematous but otherwise clear.  No pleural effusion or pneumothorax.  Bones demineralized with multiple prior vertebroplasties.  IMPRESSION: COPD changes with peripheral blebs in the RIGHT lung.  Minimal subsegmental atelectasis LEFT base.   Electronically Signed   By: Lavonia Dana M.D.   On: 10/11/2014 08:56    ASSESSMENT: Autoimmune hemolytic anemia secondary to chronic lymphocytic leukemia patient has responded well to previous rituximab therapy Presently responding to prednisone. Patient was start 40 mg of prednisone until next week Rituximab would be started today  MEDICAL DECISION MAKING:  All lab data from the hospitalization from outside institution has been reviewed. Patient had a CT scan which did not reveal any accessory spleen Hemoglobin is improved on prednisone Consent was obtained for rituximab All the questions were answered     No matching staging information was found for the patient.  Forest Gleason, MD   11/01/2014 5:59 PM

## 2014-11-03 ENCOUNTER — Inpatient Hospital Stay (HOSPITAL_BASED_OUTPATIENT_CLINIC_OR_DEPARTMENT_OTHER): Payer: Medicare Other | Admitting: Oncology

## 2014-11-03 ENCOUNTER — Inpatient Hospital Stay: Payer: Medicare Other

## 2014-11-03 ENCOUNTER — Encounter: Payer: Self-pay | Admitting: Oncology

## 2014-11-03 VITALS — BP 176/72 | HR 64 | Temp 96.6°F | Resp 18 | Wt 129.9 lb

## 2014-11-03 DIAGNOSIS — Z800 Family history of malignant neoplasm of digestive organs: Secondary | ICD-10-CM

## 2014-11-03 DIAGNOSIS — K5730 Diverticulosis of large intestine without perforation or abscess without bleeding: Secondary | ICD-10-CM

## 2014-11-03 DIAGNOSIS — R0602 Shortness of breath: Secondary | ICD-10-CM

## 2014-11-03 DIAGNOSIS — J449 Chronic obstructive pulmonary disease, unspecified: Secondary | ICD-10-CM

## 2014-11-03 DIAGNOSIS — C9110 Chronic lymphocytic leukemia of B-cell type not having achieved remission: Secondary | ICD-10-CM | POA: Diagnosis not present

## 2014-11-03 DIAGNOSIS — R609 Edema, unspecified: Secondary | ICD-10-CM

## 2014-11-03 DIAGNOSIS — R160 Hepatomegaly, not elsewhere classified: Secondary | ICD-10-CM

## 2014-11-03 DIAGNOSIS — Z803 Family history of malignant neoplasm of breast: Secondary | ICD-10-CM

## 2014-11-03 DIAGNOSIS — D594 Other nonautoimmune hemolytic anemias: Secondary | ICD-10-CM

## 2014-11-03 DIAGNOSIS — D591 Other autoimmune hemolytic anemias: Secondary | ICD-10-CM

## 2014-11-03 DIAGNOSIS — D479 Neoplasm of uncertain behavior of lymphoid, hematopoietic and related tissue, unspecified: Secondary | ICD-10-CM

## 2014-11-03 DIAGNOSIS — Z87891 Personal history of nicotine dependence: Secondary | ICD-10-CM

## 2014-11-03 DIAGNOSIS — Z79899 Other long term (current) drug therapy: Secondary | ICD-10-CM

## 2014-11-03 DIAGNOSIS — I340 Nonrheumatic mitral (valve) insufficiency: Secondary | ICD-10-CM

## 2014-11-03 DIAGNOSIS — Z8711 Personal history of peptic ulcer disease: Secondary | ICD-10-CM

## 2014-11-03 DIAGNOSIS — N281 Cyst of kidney, acquired: Secondary | ICD-10-CM

## 2014-11-03 DIAGNOSIS — Z7952 Long term (current) use of systemic steroids: Secondary | ICD-10-CM

## 2014-11-03 DIAGNOSIS — Z7982 Long term (current) use of aspirin: Secondary | ICD-10-CM

## 2014-11-03 DIAGNOSIS — I4510 Unspecified right bundle-branch block: Secondary | ICD-10-CM

## 2014-11-03 DIAGNOSIS — Z9081 Acquired absence of spleen: Secondary | ICD-10-CM

## 2014-11-03 DIAGNOSIS — Z8781 Personal history of (healed) traumatic fracture: Secondary | ICD-10-CM

## 2014-11-03 DIAGNOSIS — Z87442 Personal history of urinary calculi: Secondary | ICD-10-CM

## 2014-11-03 DIAGNOSIS — E785 Hyperlipidemia, unspecified: Secondary | ICD-10-CM

## 2014-11-03 DIAGNOSIS — I351 Nonrheumatic aortic (valve) insufficiency: Secondary | ICD-10-CM

## 2014-11-03 LAB — COMPREHENSIVE METABOLIC PANEL
ALBUMIN: 3.6 g/dL (ref 3.5–5.0)
ALT: 20 U/L (ref 17–63)
AST: 27 U/L (ref 15–41)
Alkaline Phosphatase: 63 U/L (ref 38–126)
Anion gap: 4 — ABNORMAL LOW (ref 5–15)
BUN: 26 mg/dL — ABNORMAL HIGH (ref 6–20)
CO2: 29 mmol/L (ref 22–32)
CREATININE: 0.8 mg/dL (ref 0.61–1.24)
Calcium: 7.9 mg/dL — ABNORMAL LOW (ref 8.9–10.3)
Chloride: 102 mmol/L (ref 101–111)
GFR calc Af Amer: >60 mL/min (ref >60)
GFR calc non Af Amer: >60 mL/min (ref >60)
GLUCOSE: 155 mg/dL — AB (ref 65–99)
POTASSIUM: 4.6 mmol/L (ref 3.5–5.1)
Sodium: 135 mmol/L (ref 135–145)
Total Bilirubin: 1.6 mg/dL — ABNORMAL HIGH (ref 0.3–1.2)
Total Protein: 6 g/dL — ABNORMAL LOW (ref 6.5–8.1)

## 2014-11-03 LAB — CBC WITH DIFFERENTIAL/PLATELET
Basophils Absolute: 0 10*3/uL (ref 0–0.1)
EOS ABS: 0 10*3/uL (ref 0–0.7)
HEMATOCRIT: 36.9 % — AB (ref 40.0–52.0)
Hemoglobin: 12.2 g/dL — ABNORMAL LOW (ref 13.0–18.0)
Lymphocytes Relative: 30 %
Lymphs Abs: 2 10*3/uL (ref 1.0–3.6)
MCH: 37 pg — ABNORMAL HIGH (ref 26.0–34.0)
MCHC: 33.2 g/dL (ref 32.0–36.0)
MCV: 111.7 fL — AB (ref 80.0–100.0)
Monocytes Absolute: 0.7 10*3/uL (ref 0.2–1.0)
Neutro Abs: 3.9 10*3/uL (ref 1.4–6.5)
Neutrophils Relative %: 60 %
Platelets: 184 10*3/uL (ref 150–440)
RBC: 3.3 MIL/uL — AB (ref 4.40–5.90)
RDW: 21.8 % — ABNORMAL HIGH (ref 11.5–14.5)
WBC: 6.5 10*3/uL (ref 3.8–10.6)

## 2014-11-03 LAB — MAGNESIUM: MAGNESIUM: 2.2 mg/dL (ref 1.7–2.4)

## 2014-11-03 MED ORDER — ACETAMINOPHEN 325 MG PO TABS
650.0000 mg | ORAL_TABLET | Freq: Once | ORAL | Status: AC
  Administered 2014-11-03: 650 mg via ORAL

## 2014-11-03 MED ORDER — SODIUM CHLORIDE 0.9 % IV SOLN
375.0000 mg/m2 | Freq: Once | INTRAVENOUS | Status: AC
  Administered 2014-11-03: 600 mg via INTRAVENOUS
  Filled 2014-11-03: qty 50

## 2014-11-03 MED ORDER — PREDNISONE 20 MG PO TABS: 30.0000 mg | ORAL_TABLET | Freq: Every day | ORAL | Status: DC

## 2014-11-03 MED ORDER — ACETAMINOPHEN 325 MG PO TABS
650.0000 mg | ORAL_TABLET | Freq: Once | ORAL | Status: AC
  Administered 2014-11-03: 650 mg via ORAL
  Filled 2014-11-03: qty 2

## 2014-11-03 MED ORDER — SODIUM CHLORIDE 0.9 % IV SOLN: 375.0000 mg/m2 | Freq: Once | INTRAVENOUS | Status: DC

## 2014-11-03 MED ORDER — SODIUM CHLORIDE 0.9 % IV SOLN
Freq: Once | INTRAVENOUS | Status: AC
  Administered 2014-11-03: 10:00:00 via INTRAVENOUS
  Filled 2014-11-03: qty 1000

## 2014-11-03 MED ORDER — DIPHENHYDRAMINE HCL 50 MG/ML IJ SOLN
6.2500 mg | Freq: Once | INTRAMUSCULAR | Status: AC
  Administered 2014-11-03: 6.5 mg via INTRAVENOUS
  Filled 2014-11-03: qty 1

## 2014-11-03 NOTE — Progress Notes (Signed)
[facility] @ [facility] Telephone:([phone]) [phone]  Fax:([phone]) [phone]     [name] OB: 23-Jun-1929  MR#: [id number]  TML#:[id number]  Patient Care Team: Juline Patch, MD as PCP - General (Family Medicine) Kathrynn Ducking, MD (Neurology) Forest Gleason, MD as Consulting Physician (Unknown Physician Specialty) Carlena Bjornstad, MD as Consulting Physician (Cardiology)  CHIEF COMPLAINT:  Chief Complaint  Patient presents with  . Follow-up    Oncology History   Chief Complaint/Problem List  1.  Autoimmune hemolytic anemia. Previously received Prednisone taper.   2.  Splenectomy in 5/02.  3.  Gastric ulcer diagnosed on endoscopy and multiple biopsies are pending, November 2005. 4.  Chronic lymphocytic leukemia. 5.vitamin B 12 deficiency 6.  Recent admission in the hospital in June of 2016 with hemolytic anemia and hemoglobin of 5.5 patient received blood transfusion was started on steroid 7.  Patient was started on rituximab (October 20, 2014     Chronic lymphocytic leukemia   08/01/2008 Initial Diagnosis Chronic lymphocytic leukemia    Oncology Flowsheet 11/04/2013 10/11/2014 10/11/2014 10/12/2014 10/13/2014 10/20/2014 10/27/2014  cyanocobalamin ((VITAMIN B-12)) IM - 1,000 mcg   - - - 1,000 mcg  methylPREDNISolone sodium succinate 40 mg/mL (SOLU-MEDROL) IV 40 mg - - - - - -  predniSONE (DELTASONE) PO - 80 mg 80 mg 80 mg 80 mg - -  riTUXimab (RITUXAN) IV - - - - - 375 mg/m2 375 mg/m2  zolendronic acid (ZOMETA) IV - 4 mg   - - - -    INTERVAL HISTORY:  81 year old gentleman was recently admitted in the hospital with increasing shortness of breath.  Hemoglobin was 5.5.  Patient received blood transfusion patient has autoimmune hemolytic anemia secondary to call chronic lymphocytic leukemia.  Had received Rituxan in the past. Patient was started on prednisone 80 mg daily.  Hemoglobin improved to 10.5 patient is here for ongoing evaluation and treatment consideration November 03, 2014 Patient is here  for ongoing evaluation taking prednisone 40 mg daily which has been now decreased 30 mg by mouth daily hemoglobin is improving.  Tolerating Rituxan with minimal rash and no other side effect. REVIEW OF SYSTEMS:    general status: Patient is feeling weak and tired.  No change in a performance status.  No chills.  No fever. HEENT:.  No evidence of stomatitis Lungs: No cough or shortness of breath Cardiac: No chest pain or paroxysmal nocturnal dyspnea GI: No nausea no vomiting no diarrhea no abdominal pain Skin: No rash Lower extremity no swelling Neurological system: No tingling.  No numbness.  No other focal signs Musculoskeletal system no bony pains  As per HPI. Otherwise, a complete review of systems is negatve.  PAST MEDICAL HISTORY: Past Medical History  Diagnosis Date  . CLL (chronic lymphoblastic leukemia) 2006    Rituxan treatment 2006  . Atrial septal aneurysm 2009    Insignificant, no, January, 2009  . Infection of PEG site     cellulitis.Marland KitchenResolved  . Vertebral compression fracture     multiple levels  . Herpes zoster     right chest  . Aortic insufficiency 2009    mild, Mild, echo, 2012  . Diastolic dysfunction     mild  . Chest pain     EF 55%, echo, October, 2009, possible inferior and posterior borderline hypokinesis, patient has never  had cardiac catheterization  . Edema     EF 55%, echo, October, 2009, possible borderline inferior and posterior hypokinesis... heart catheterization has never been done(July 10, 2009)  .  Myositis     Caused pulmonary insufficiency with muscle weakness in the past / Dr.Willis-neurology, Imuran and prednisone  . Calculus of kidney   . Inguinal hernia without mention of obstruction or gangrene, unilateral or unspecified, (not specified as recurrent)     right   . Unspecified gastritis and gastroduodenitis without mention of hemorrhage   . Abdominal pain, epigastric   . Nonspecific elevation of levels of transaminase or lactic acid  dehydrogenase (LDH)   . Feeding difficulties and mismanagement   . Edema of male genital organs     Resolved, occurred during illness related to myositis  . Swelling of arm     left.Marland KitchenMarland KitchenResolved.... no DVT  . LFT elevation     Related to Imuran in the past... dose was adjusted  . Dyslipidemia     statins not used due to LFT abnormalities  . Shortness of breath     Breathing abnormalities related to muscle weakness from myositis  . RBBB (right bundle branch block)     Old  . Bradycardia     Sinus bradycardia, old  . Ejection fraction     EF 60%, echo, October, 2012  . Mitral regurgitation     Mild / moderate, echo, 2012  . Right ventricular dysfunction     Mild, echo, 2012  . Febrile illness     May, 2013, question sinusitis  . Polymyositis 11/26/2012    PAST SURGICAL HISTORY: Past Surgical History  Procedure Laterality Date  . Splenectomy    . Hernia repair      left side  . Peg placement  06/2007  . Peg tube removal  12/2007  . Kyphosis surgery      multilevel vertebral  . Odontoid fracture surgery      anterior screw fixation  . Nasal sinus surgery      x2  . Cataract extraction      bi lateral    FAMILY HISTORY Family History  Problem Relation Age of Onset  . Breast cancer Sister   . Colon cancer Brother   . Prostate cancer Brother   . Stroke Mother   . Hypertension Mother   . Emphysema Father   . Diabetes Grandchild   . Heart attack Neg Hx   . Cancer Brother   . Cancer Sister   . Cancer Daughter     ADVANCED DIRECTIVES:  Patient does have advance healthcare directive, Patient   does not desire to make any changes  HEALTH MAINTENANCE: History  Substance Use Topics  . Smoking status: Former Smoker -- 0.50 packs/day for 31 years    Types: Cigarettes    Quit date: 06/29/1980  . Smokeless tobacco: Never Used  . Alcohol Use: No     Comment: rarely      Allergies  Allergen Reactions  . Sulfonamide Derivatives     Current Outpatient  Prescriptions  Medication Sig Dispense Refill  . ASMANEX 60 METERED DOSES 220 MCG/INH inhaler Inhale 1 puff into the lungs 2 (two) times daily.  0  . aspirin 81 MG tablet Take 81 mg by mouth at bedtime.     . calcium-vitamin D (CALCIUM 500+D) 500-200 MG-UNIT per tablet Take 1 tablet by mouth 2 (two) times daily.      . Cholecalciferol (VITAMIN D3) 1000 UNITS tablet Take 1,000 Units by mouth daily.      . citalopram (CELEXA) 20 MG tablet Take 1 tablet (20 mg total) by mouth daily. 30 tablet 0  . cyanocobalamin (,VITAMIN B-12,)  1000 MCG/ML injection Inject 1,000 mcg into the muscle every 14 (fourteen) days. At the first of the month    . ferrous sulfate 325 (65 FE) MG tablet Take 325 mg by mouth daily with breakfast.      . fish oil-omega-3 fatty acids 1000 MG capsule Take 1 g by mouth daily at 12 noon.     . fluconazole (DIFLUCAN) 100 MG tablet Take 1 tablet (100 mg total) by mouth daily. 30 tablet 0  . folic acid (FOLVITE) 1 MG tablet Take 4 tablets (4 mg total) by mouth daily. 120 tablet 0  . furosemide (LASIX) 40 MG tablet Take 1 tablet (40 mg total) by mouth daily. 90 tablet 3  . HYDROcodone-acetaminophen (NORCO/VICODIN) 5-325 MG per tablet Take 1 tablet by mouth every 6 (six) hours as needed for pain.    . montelukast (SINGULAIR) 10 MG tablet Take 10 mg by mouth at bedtime.   11  . Multiple Vitamin (MULTIVITAMIN) capsule Take 1 capsule by mouth daily.      . mycophenolate (CELLCEPT) 500 MG tablet Take 1 tablet (500 mg total) by mouth daily. 90 tablet 3  . nystatin cream (MYCOSTATIN) Apply 1 application topically daily as needed for dry skin.   0  . pantoprazole (PROTONIX) 40 MG tablet Take 1 tablet (40 mg total) by mouth daily at 12 noon. 30 tablet 0  . potassium chloride SA (KLOR-CON M20) 20 MEQ tablet Take 1 tablet (20 mEq total) by mouth daily. Take one tablet by mouth once daily. Take an extra tablet twice a week. (Patient taking differently: Take 20-40 mEq by mouth daily. Take an extra  tablet twice a week on Monday and Friday) 114 tablet 3  . predniSONE (DELTASONE) 20 MG tablet Take 1.5 tablets (30 mg total) by mouth daily with supper. 120 tablet 0  . PROAIR HFA 108 (90 BASE) MCG/ACT inhaler Inhale 2 puffs into the lungs 4 (four) times daily.   11  . Resource Beneprotein PACK Take 3g three times a day    . temazepam (RESTORIL) 30 MG capsule Take 30 mg by mouth at bedtime.      . triamcinolone cream (KENALOG) 0.1 % Apply 1 application topically daily at 12 noon.   0  . vitamin B-12 (CYANOCOBALAMIN) 1000 MCG tablet Take 1,000 mcg by mouth daily at 12 noon.      No current facility-administered medications for this visit.    OBJECTIVE:  Filed Vitals:   11/03/14 0916  BP: 176/72  Pulse: 64  Temp: 96.6 F (35.9 C)  Resp: 18     Body mass index is 21.61 kg/(m^2).    ECOG FS:1 - Symptomatic but completely ambulatory  PHYSICAL EXAM: Gen. status: Patient is alert oriented not any acute distress Lymphatic system: Supraclavicular, cervical, axillary, inguinal lymph nodes are not palpable Head exam was generally normal. There was no scleral icterus or corneal arcus. Mucous membranes were moist. Cardiac: Tachycardia Abdominal exam revealed normal bowel sounds. The abdomen was soft, non-tender, and without masses, organomegaly, or appreciable enlargement of the abdominal aorta. Examination of the skin revealed no evidence of significant rashes, suspicious appearing nevi or other concerning lesions. Neurologically, the patient was awake, alert, and oriented to person, place and time. There were no obvious focal neurologic abnormalities. Lower extremity no edema Kyphoscoliosis: Osteoporosis and multiple vertebral compression fracture  Skin: Grade 1 rash LAB RESULTS:  Appointment on 11/03/2014  Component Date Value Ref Range Status  . WBC 11/03/2014 6.5  3.8 - 10.6  K/uL Final  . RBC 11/03/2014 3.30* 4.40 - 5.90 MIL/uL Final  . Hemoglobin 11/03/2014 12.2* 13.0 - 18.0 g/dL  Final  . HCT 11/03/2014 36.9* 40.0 - 52.0 % Final  . MCV 11/03/2014 111.7* 80.0 - 100.0 fL Final  . MCH 11/03/2014 37.0* 26.0 - 34.0 pg Final  . MCHC 11/03/2014 33.2  32.0 - 36.0 g/dL Final  . RDW 11/03/2014 21.8* 11.5 - 14.5 % Final  . Platelets 11/03/2014 184  150 - 440 K/uL Final  . Neutrophils Relative % 11/03/2014 60%   Final  . Neutro Abs 11/03/2014 3.9  1.4 - 6.5 K/uL Final  . Lymphocytes Relative 11/03/2014 30%   Final  . Lymphs Abs 11/03/2014 2.0  1.0 - 3.6 K/uL Final  . Monocytes Relative 11/03/2014 10%   Final  . Monocytes Absolute 11/03/2014 0.7  0.2 - 1.0 K/uL Final  . Eosinophils Relative 11/03/2014 0%   Final  . Eosinophils Absolute 11/03/2014 0.0  0 - 0.7 K/uL Final  . Basophils Relative 11/03/2014 0%   Final  . Basophils Absolute 11/03/2014 0.0  0 - 0.1 K/uL Final      STUDIES: Ct Abdomen W Contrast  10/11/2014   CLINICAL DATA:  Current history of hemolytic anemia associated with lymphoproliferative disorder. Prior splenectomy. CT requested to evaluate for accessory splenic tissue.  EXAM: CT ABDOMEN WITH CONTRAST  TECHNIQUE: Multidetector CT imaging of the abdomen was performed using the standard protocol following bolus administration of intravenous contrast.  CONTRAST:  7mL OMNIPAQUE IOHEXOL 300 MG/ML IV. Oral contrast was also administered.  COMPARISON: CT abdomen and pelvis 07/04/2008.  FINDINGS: No evidence of accessory splenic tissue in the splenectomy bed or elsewhere in the abdomen. Scattered calcified granulomata throughout the liver which is moderately enlarged, likely mildly increased in size since the 2010 examination. No significant focal hepatic parenchymal abnormality. Normal pancreas, adrenal glands and right kidney. Approximate 1.2 cm exophytic simple cyst arising from the upper pole of the left kidney, unchanged; no significant abnormality involving the left kidney. Severe aortoiliac atherosclerosis without aneurysm. No significant lymphadenopathy.   Normal-appearing stomach filled with food. Visualized small bowel normal in appearance. Large stool burden throughout the visualized colon. Extensive diverticulosis involving the visualized descending colon without evidence of acute diverticulitis. No ascites.  Bone window images demonstrate severe generalized osseous demineralization with compression fractures of T11, T12, L1, L2, L3, L4 and L5, all with prior augmentation. Visualized lung bases clear apart from scarring deep in the lower lobes. Heart markedly enlarged.  IMPRESSION: 1. No evidence of accessory splenic tissue in the splenectomy bed or elsewhere in the abdomen. 2. Hepatomegaly, with likely mild increase in size since 2010. No significant focal abnormality involving the liver. 3. Diverticulosis involving the visualized descending colon without evidence of acute diverticulitis.   Electronically Signed   By: Evangeline Dakin M.D.   On: 10/11/2014 13:49   Dg Chest Port 1 View  10/11/2014   CLINICAL DATA:  Shortness of breath for several months worsened over past couple weeks, former smoker, history CLL, diastolic dysfunction, mitral regurgitation, aortic insufficiency  EXAM: PORTABLE CHEST - 1 VIEW  COMPARISON:  Portable exam 0727 hours compared to 08/21/2014  FINDINGS: Enlargement of cardiac silhouette.  Calcified tortuous aorta.  Mediastinal contours and pulmonary vascularity normal.  Peripheral lucency is seen at the lateral mid RIGHT chest, similar to an earlier exam of 07/16/2008, suspect related to the multiple peripheral RIGHT blebs identified by prior CT chest, perhaps slightly more prominent and conspicuity due to patient  rotation to the RIGHT.  No definite pneumothorax identified.  Biapical scarring with minimal subsegmental atelectasis at LEFT base.  Lungs appear emphysematous but otherwise clear.  No pleural effusion or pneumothorax.  Bones demineralized with multiple prior vertebroplasties.  IMPRESSION: COPD changes with peripheral blebs  in the RIGHT lung.  Minimal subsegmental atelectasis LEFT base.   Electronically Signed   By: Lavonia Dana M.D.   On: 10/11/2014 08:56    ASSESSMENT: Autoimmune hemolytic anemia with lymphoproliferative disease Prednisone (decreased to 30 mg daily hemoglobin is improved to 12.2 g Prednisone will be decreased to 20 mg and patient will receive last dose of Rituxan therapy extremely   MEDICAL DECISION MAKING:  All lab data from the hospitalization from outside institution has been reviewed. Decrease prednisone to 30 mg daily for a week followed by 20 mg starting next week     No matching staging information was found for the patient.  Forest Gleason, MD   11/03/2014 9:35 AM

## 2014-11-03 NOTE — Progress Notes (Signed)
Patient does have living will.  Former smoker. 

## 2014-11-10 ENCOUNTER — Inpatient Hospital Stay: Payer: Medicare Other | Attending: Oncology

## 2014-11-10 ENCOUNTER — Encounter: Payer: Self-pay | Admitting: Oncology

## 2014-11-10 ENCOUNTER — Inpatient Hospital Stay (HOSPITAL_BASED_OUTPATIENT_CLINIC_OR_DEPARTMENT_OTHER): Payer: Medicare Other | Admitting: Oncology

## 2014-11-10 ENCOUNTER — Inpatient Hospital Stay: Payer: Medicare Other

## 2014-11-10 ENCOUNTER — Other Ambulatory Visit: Payer: Self-pay | Admitting: *Deleted

## 2014-11-10 VITALS — BP 162/71 | HR 66 | Resp 20

## 2014-11-10 VITALS — BP 155/72 | HR 65 | Temp 96.4°F | Wt 128.1 lb

## 2014-11-10 DIAGNOSIS — I340 Nonrheumatic mitral (valve) insufficiency: Secondary | ICD-10-CM | POA: Diagnosis not present

## 2014-11-10 DIAGNOSIS — Z803 Family history of malignant neoplasm of breast: Secondary | ICD-10-CM | POA: Insufficient documentation

## 2014-11-10 DIAGNOSIS — E538 Deficiency of other specified B group vitamins: Secondary | ICD-10-CM | POA: Insufficient documentation

## 2014-11-10 DIAGNOSIS — Z8711 Personal history of peptic ulcer disease: Secondary | ICD-10-CM | POA: Insufficient documentation

## 2014-11-10 DIAGNOSIS — R531 Weakness: Secondary | ICD-10-CM | POA: Diagnosis not present

## 2014-11-10 DIAGNOSIS — Z8042 Family history of malignant neoplasm of prostate: Secondary | ICD-10-CM

## 2014-11-10 DIAGNOSIS — Z7982 Long term (current) use of aspirin: Secondary | ICD-10-CM | POA: Insufficient documentation

## 2014-11-10 DIAGNOSIS — Z800 Family history of malignant neoplasm of digestive organs: Secondary | ICD-10-CM | POA: Insufficient documentation

## 2014-11-10 DIAGNOSIS — R21 Rash and other nonspecific skin eruption: Secondary | ICD-10-CM | POA: Insufficient documentation

## 2014-11-10 DIAGNOSIS — I4510 Unspecified right bundle-branch block: Secondary | ICD-10-CM

## 2014-11-10 DIAGNOSIS — Z87891 Personal history of nicotine dependence: Secondary | ICD-10-CM

## 2014-11-10 DIAGNOSIS — R5383 Other fatigue: Secondary | ICD-10-CM | POA: Diagnosis not present

## 2014-11-10 DIAGNOSIS — Z87442 Personal history of urinary calculi: Secondary | ICD-10-CM | POA: Insufficient documentation

## 2014-11-10 DIAGNOSIS — D594 Other nonautoimmune hemolytic anemias: Principal | ICD-10-CM

## 2014-11-10 DIAGNOSIS — I2510 Atherosclerotic heart disease of native coronary artery without angina pectoris: Secondary | ICD-10-CM

## 2014-11-10 DIAGNOSIS — R0602 Shortness of breath: Secondary | ICD-10-CM | POA: Diagnosis not present

## 2014-11-10 DIAGNOSIS — D591 Other autoimmune hemolytic anemias: Secondary | ICD-10-CM | POA: Insufficient documentation

## 2014-11-10 DIAGNOSIS — Z9081 Acquired absence of spleen: Secondary | ICD-10-CM | POA: Insufficient documentation

## 2014-11-10 DIAGNOSIS — I253 Aneurysm of heart: Secondary | ICD-10-CM

## 2014-11-10 DIAGNOSIS — Z79899 Other long term (current) drug therapy: Secondary | ICD-10-CM | POA: Insufficient documentation

## 2014-11-10 DIAGNOSIS — E785 Hyperlipidemia, unspecified: Secondary | ICD-10-CM | POA: Diagnosis not present

## 2014-11-10 DIAGNOSIS — I351 Nonrheumatic aortic (valve) insufficiency: Secondary | ICD-10-CM | POA: Insufficient documentation

## 2014-11-10 DIAGNOSIS — D479 Neoplasm of uncertain behavior of lymphoid, hematopoietic and related tissue, unspecified: Secondary | ICD-10-CM

## 2014-11-10 DIAGNOSIS — R000 Tachycardia, unspecified: Secondary | ICD-10-CM | POA: Insufficient documentation

## 2014-11-10 DIAGNOSIS — Z5111 Encounter for antineoplastic chemotherapy: Secondary | ICD-10-CM | POA: Insufficient documentation

## 2014-11-10 DIAGNOSIS — C9110 Chronic lymphocytic leukemia of B-cell type not having achieved remission: Secondary | ICD-10-CM | POA: Insufficient documentation

## 2014-11-10 DIAGNOSIS — Z809 Family history of malignant neoplasm, unspecified: Secondary | ICD-10-CM | POA: Diagnosis not present

## 2014-11-10 DIAGNOSIS — D72829 Elevated white blood cell count, unspecified: Secondary | ICD-10-CM | POA: Diagnosis not present

## 2014-11-10 DIAGNOSIS — L03116 Cellulitis of left lower limb: Secondary | ICD-10-CM | POA: Insufficient documentation

## 2014-11-10 LAB — CBC WITH DIFFERENTIAL/PLATELET
BASOS ABS: 0 10*3/uL (ref 0–0.1)
Basophils Relative: 0 %
Eosinophils Absolute: 0 10*3/uL (ref 0–0.7)
Eosinophils Relative: 0 %
HCT: 37.3 % — ABNORMAL LOW (ref 40.0–52.0)
Hemoglobin: 12.5 g/dL — ABNORMAL LOW (ref 13.0–18.0)
LYMPHS PCT: 19 %
Lymphs Abs: 1.7 10*3/uL (ref 1.0–3.6)
MCH: 37.5 pg — AB (ref 26.0–34.0)
MCHC: 33.6 g/dL (ref 32.0–36.0)
MCV: 111.8 fL — AB (ref 80.0–100.0)
MONO ABS: 0.9 10*3/uL (ref 0.2–1.0)
Monocytes Relative: 10 %
NEUTROS ABS: 6.4 10*3/uL (ref 1.4–6.5)
Neutrophils Relative %: 71 %
Platelets: 178 10*3/uL (ref 150–440)
RBC: 3.34 MIL/uL — AB (ref 4.40–5.90)
RDW: 19.1 % — AB (ref 11.5–14.5)
WBC: 9.2 10*3/uL (ref 3.8–10.6)

## 2014-11-10 LAB — COMPREHENSIVE METABOLIC PANEL
ALBUMIN: 3.6 g/dL (ref 3.5–5.0)
ALK PHOS: 59 U/L (ref 38–126)
ALT: 19 U/L (ref 17–63)
ANION GAP: 1 — AB (ref 5–15)
AST: 25 U/L (ref 15–41)
BUN: 21 mg/dL — ABNORMAL HIGH (ref 6–20)
CALCIUM: 7.9 mg/dL — AB (ref 8.9–10.3)
CO2: 31 mmol/L (ref 22–32)
Chloride: 101 mmol/L (ref 101–111)
Creatinine, Ser: 0.76 mg/dL (ref 0.61–1.24)
GFR calc Af Amer: >60 mL/min (ref >60)
Glucose, Bld: 120 mg/dL — ABNORMAL HIGH (ref 65–99)
Potassium: 4 mmol/L (ref 3.5–5.1)
SODIUM: 133 mmol/L — AB (ref 135–145)
Total Bilirubin: 1.2 mg/dL (ref 0.3–1.2)
Total Protein: 6 g/dL — ABNORMAL LOW (ref 6.5–8.1)

## 2014-11-10 LAB — MAGNESIUM: Magnesium: 2.3 mg/dL (ref 1.7–2.4)

## 2014-11-10 MED ORDER — ACETAMINOPHEN 325 MG PO TABS
650.0000 mg | ORAL_TABLET | Freq: Once | ORAL | Status: AC
Start: 2014-11-10 — End: 2014-11-10
  Administered 2014-11-10: 650 mg via ORAL
  Filled 2014-11-10: qty 2

## 2014-11-10 MED ORDER — DIPHENHYDRAMINE HCL 50 MG/ML IJ SOLN
6.2500 mg | Freq: Once | INTRAMUSCULAR | Status: AC
  Administered 2014-11-10: 6.5 mg via INTRAVENOUS
  Filled 2014-11-10: qty 1

## 2014-11-10 MED ORDER — SODIUM CHLORIDE 0.9 % IV SOLN
Freq: Once | INTRAVENOUS | Status: AC
  Administered 2014-11-10: 09:00:00 via INTRAVENOUS
  Filled 2014-11-10: qty 1000

## 2014-11-10 MED ORDER — SODIUM CHLORIDE 0.9 % IV SOLN
375.0000 mg/m2 | Freq: Once | INTRAVENOUS | Status: AC
  Administered 2014-11-10: 600 mg via INTRAVENOUS
  Filled 2014-11-10: qty 50

## 2014-11-10 MED ORDER — CYANOCOBALAMIN 1000 MCG/ML IJ SOLN
1000.0000 ug | Freq: Once | INTRAMUSCULAR | Status: AC
  Administered 2014-11-10: 1000 ug via INTRAMUSCULAR
  Filled 2014-11-10: qty 1

## 2014-11-10 MED ORDER — RITUXIMAB CHEMO INJECTION 500 MG/50ML: 375.0000 mg/m2 | Freq: Once | INTRAVENOUS | Status: DC

## 2014-11-10 NOTE — Progress Notes (Signed)
[place] @ [facility] Telephone:([phone]) [phone]  [phone]     Orbin Mayeux OB: 1929-12-06  MR#: [id number]  SAY#:[id number]  Patient Care Team: Juline Patch, MD as PCP - General (Family Medicine) Kathrynn Ducking, MD (Neurology) Forest Gleason, MD as Consulting Physician (Unknown Physician Specialty) Carlena Bjornstad, MD as Consulting Physician (Cardiology)  CHIEF COMPLAINT:  Chief Complaint  Patient presents with  . Follow-up    Oncology History   Chief Complaint/Problem List  1.  Autoimmune hemolytic anemia. Previously received Prednisone taper.   2.  Splenectomy in 5/02.  3.  Gastric ulcer diagnosed on endoscopy and multiple biopsies are pending, November 2005. 4.  Chronic lymphocytic leukemia. 5.vitamin B 12 deficiency 6.  Recent admission in the hospital in June of 2016 with hemolytic anemia and hemoglobin of 5.5 patient received blood transfusion was started on steroid 7.  Patient was started on rituximab (October 20, 2014     Chronic lymphocytic leukemia   08/01/2008 Initial Diagnosis Chronic lymphocytic leukemia    Oncology Flowsheet 10/11/2014 10/11/2014 10/12/2014 10/13/2014 10/20/2014 10/27/2014 11/03/2014  cyanocobalamin ((VITAMIN B-12)) IM 1,000 mcg   - - - 1,000 mcg -  methylPREDNISolone sodium succinate 40 mg/mL (SOLU-MEDROL) IV - - - - - - -  predniSONE (DELTASONE) PO 80 mg 80 mg 80 mg 80 mg - - -  riTUXimab (RITUXAN) IV - - - - 375 mg/m2 375 mg/m2 375 mg/m2  zolendronic acid (ZOMETA) IV 4 mg   - - - - -    INTERVAL HISTORY:  81 year old gentleman was recently admitted in the hospital with increasing shortness of breath.  Hemoglobin was 5.5.  Patient received blood transfusion patient has autoimmune hemolytic anemia secondary to call chronic lymphocytic leukemia.  Had received Rituxan in the past. Patient was started on prednisone 80 mg daily.  Hemoglobin improved to 10.5 patient is here for ongoing evaluation and treatment consideration November 03, 2014 Patient is  here for ongoing evaluation taking prednisone 40 mg daily which has been now decreased 30 mg by mouth daily hemoglobin is improving.  Tolerating Rituxan with minimal rash and no other side effect. November 10, 2014 Patient is here for his last chemotherapy cycle.  No chills.  No fever.  Hemoglobin is improved.  No rash.  No diarrhea.  Patient had skin infection in the left axillary area Also had number of questions regarding discontinuing Diflucan and Protonix On folic acid which will be continued.  REVIEW OF SYSTEMS:    general status: Patient is feeling weak and tired.  No change in a performance status.  No chills.  No fever. HEENT:.  No evidence of stomatitis Lungs: No cough or shortness of breath Cardiac: No chest pain or paroxysmal nocturnal dyspnea GI: No nausea no vomiting no diarrhea no abdominal pain Skin: No rash Lower extremity no swelling Neurological system: No tingling.  No numbness.  No other focal signs Musculoskeletal system no bony pains  As per HPI. Otherwise, a complete review of systems is negatve.  PAST MEDICAL HISTORY: Past Medical History  Diagnosis Date  . CLL (chronic lymphoblastic leukemia) 2006    Rituxan treatment 2006  . Atrial septal aneurysm 2009    Insignificant, no, January, 2009  . Infection of PEG site     cellulitis.Marland KitchenResolved  . Vertebral compression fracture     multiple levels  . Herpes zoster     right chest  . Aortic insufficiency 2009    mild, Mild, echo, 2012  . Diastolic dysfunction  mild  . Chest pain     EF 55%, echo, October, 2009, possible inferior and posterior borderline hypokinesis, patient has never  had cardiac catheterization  . Edema     EF 55%, echo, October, 2009, possible borderline inferior and posterior hypokinesis... heart catheterization has never been done(July 10, 2009)  . Myositis     Caused pulmonary insufficiency with muscle weakness in the past / Dr.Willis-neurology, Imuran and prednisone  . Calculus of  kidney   . Inguinal hernia without mention of obstruction or gangrene, unilateral or unspecified, (not specified as recurrent)     right   . Unspecified gastritis and gastroduodenitis without mention of hemorrhage   . Abdominal pain, epigastric   . Nonspecific elevation of levels of transaminase or lactic acid dehydrogenase (LDH)   . Feeding difficulties and mismanagement   . Edema of male genital organs     Resolved, occurred during illness related to myositis  . Swelling of arm     left.Marland KitchenMarland KitchenResolved.... no DVT  . LFT elevation     Related to Imuran in the past... dose was adjusted  . Dyslipidemia     statins not used due to LFT abnormalities  . Shortness of breath     Breathing abnormalities related to muscle weakness from myositis  . RBBB (right bundle branch block)     Old  . Bradycardia     Sinus bradycardia, old  . Ejection fraction     EF 60%, echo, October, 2012  . Mitral regurgitation     Mild / moderate, echo, 2012  . Right ventricular dysfunction     Mild, echo, 2012  . Febrile illness     May, 2013, question sinusitis  . Polymyositis 11/26/2012    PAST SURGICAL HISTORY: Past Surgical History  Procedure Laterality Date  . Splenectomy    . Hernia repair      left side  . Peg placement  06/2007  . Peg tube removal  12/2007  . Kyphosis surgery      multilevel vertebral  . Odontoid fracture surgery      anterior screw fixation  . Nasal sinus surgery      x2  . Cataract extraction      bi lateral    FAMILY HISTORY Family History  Problem Relation Age of Onset  . Breast cancer Sister   . Colon cancer Brother   . Prostate cancer Brother   . Stroke Mother   . Hypertension Mother   . Emphysema Father   . Diabetes Grandchild   . Heart attack Neg Hx   . Cancer Brother   . Cancer Sister   . Cancer Daughter     ADVANCED DIRECTIVES:  Patient does have advance healthcare directive, Patient   does not desire to make any changes  HEALTH MAINTENANCE: History    Substance Use Topics  . Smoking status: Former Smoker -- 0.50 packs/day for 31 years    Types: Cigarettes    Quit date: 06/29/1980  . Smokeless tobacco: Never Used  . Alcohol Use: No     Comment: rarely      Allergies  Allergen Reactions  . Sulfonamide Derivatives     Current Outpatient Prescriptions  Medication Sig Dispense Refill  . ASMANEX 60 METERED DOSES 220 MCG/INH inhaler Inhale 1 puff into the lungs 2 (two) times daily.  0  . aspirin 81 MG tablet Take 81 mg by mouth at bedtime.     . calcium-vitamin D (CALCIUM 500+D) 500-200 MG-UNIT per  tablet Take 1 tablet by mouth 2 (two) times daily.      . Cholecalciferol (VITAMIN D3) 1000 UNITS tablet Take 1,000 Units by mouth daily.      . citalopram (CELEXA) 20 MG tablet Take 1 tablet (20 mg total) by mouth daily. 30 tablet 0  . cyanocobalamin (,VITAMIN B-12,) 1000 MCG/ML injection Inject 1,000 mcg into the muscle every 14 (fourteen) days. At the first of the month    . ferrous sulfate 325 (65 FE) MG tablet Take 325 mg by mouth daily with breakfast.      . fish oil-omega-3 fatty acids 1000 MG capsule Take 1 g by mouth daily at 12 noon.     . fluconazole (DIFLUCAN) 100 MG tablet Take 1 tablet (100 mg total) by mouth daily. 30 tablet 0  . folic acid (FOLVITE) 1 MG tablet Take 4 tablets (4 mg total) by mouth daily. 120 tablet 0  . furosemide (LASIX) 40 MG tablet Take 1 tablet (40 mg total) by mouth daily. 90 tablet 3  . HYDROcodone-acetaminophen (NORCO/VICODIN) 5-325 MG per tablet Take 1 tablet by mouth every 6 (six) hours as needed for pain.    . montelukast (SINGULAIR) 10 MG tablet Take 10 mg by mouth at bedtime.   11  . Multiple Vitamin (MULTIVITAMIN) capsule Take 1 capsule by mouth daily.      . mycophenolate (CELLCEPT) 500 MG tablet Take 1 tablet (500 mg total) by mouth daily. 90 tablet 3  . nystatin cream (MYCOSTATIN) Apply 1 application topically daily as needed for dry skin.   0  . pantoprazole (PROTONIX) 40 MG tablet Take 1  tablet (40 mg total) by mouth daily at 12 noon. 30 tablet 0  . potassium chloride SA (KLOR-CON M20) 20 MEQ tablet Take 1 tablet (20 mEq total) by mouth daily. Take one tablet by mouth once daily. Take an extra tablet twice a week. (Patient taking differently: Take 20-40 mEq by mouth daily. Take an extra tablet twice a week on Monday and Friday) 114 tablet 3  . predniSONE (DELTASONE) 20 MG tablet Take 1.5 tablets (30 mg total) by mouth daily with supper. 120 tablet 0  . PROAIR HFA 108 (90 BASE) MCG/ACT inhaler Inhale 2 puffs into the lungs 4 (four) times daily.   11  . Resource Beneprotein PACK Take 3g three times a day    . temazepam (RESTORIL) 30 MG capsule Take 30 mg by mouth at bedtime.      . triamcinolone cream (KENALOG) 0.1 % Apply 1 application topically daily at 12 noon.   0  . vitamin B-12 (CYANOCOBALAMIN) 1000 MCG tablet Take 1,000 mcg by mouth daily at 12 noon.      No current facility-administered medications for this visit.    OBJECTIVE:  Filed Vitals:   11/10/14 0836  BP: 155/72  Pulse: 65  Temp: 96.4 F (35.8 C)     Body mass index is 21.31 kg/(m^2).    ECOG FS:1 - Symptomatic but completely ambulatory  PHYSICAL EXAM: Gen. status: Patient is alert oriented not any acute distress Lymphatic system: Supraclavicular, cervical, axillary, inguinal lymph nodes are not palpable Head exam was generally normal. There was no scleral icterus or corneal arcus. Mucous membranes were moist. Cardiac: Tachycardia Abdominal exam revealed normal bowel sounds. The abdomen was soft, non-tender, and without masses, organomegaly, or appreciable enlargement of the abdominal aorta. Examination of the skin revealed no evidence of significant rashes, suspicious appearing nevi or other concerning lesions. Neurologically, the patient was awake,  alert, and oriented to person, place and time. There were no obvious focal neurologic abnormalities. Lower extremity no edema Kyphoscoliosis: Osteoporosis  and multiple vertebral compression fracture  Skin: Grade 1 rash LAB RESULTS:  Appointment on 11/10/2014  Component Date Value Ref Range Status  . WBC 11/10/2014 9.2  3.8 - 10.6 K/uL Final  . RBC 11/10/2014 3.34* 4.40 - 5.90 MIL/uL Final  . Hemoglobin 11/10/2014 12.5* 13.0 - 18.0 g/dL Final  . HCT 11/10/2014 37.3* 40.0 - 52.0 % Final  . MCV 11/10/2014 111.8* 80.0 - 100.0 fL Final  . MCH 11/10/2014 37.5* 26.0 - 34.0 pg Final  . MCHC 11/10/2014 33.6  32.0 - 36.0 g/dL Final  . RDW 11/10/2014 19.1* 11.5 - 14.5 % Final  . Platelets 11/10/2014 178  150 - 440 K/uL Final  . Neutrophils Relative % 11/10/2014 71   Final  . Neutro Abs 11/10/2014 6.4  1.4 - 6.5 K/uL Final  . Lymphocytes Relative 11/10/2014 19   Final  . Lymphs Abs 11/10/2014 1.7  1.0 - 3.6 K/uL Final  . Monocytes Relative 11/10/2014 10   Final  . Monocytes Absolute 11/10/2014 0.9  0.2 - 1.0 K/uL Final  . Eosinophils Relative 11/10/2014 0   Final  . Eosinophils Absolute 11/10/2014 0.0  0 - 0.7 K/uL Final  . Basophils Relative 11/10/2014 0   Final  . Basophils Absolute 11/10/2014 0.0  0 - 0.1 K/uL Final      STUDIES: Ct Abdomen W Contrast  10/11/2014   CLINICAL DATA:  Current history of hemolytic anemia associated with lymphoproliferative disorder. Prior splenectomy. CT requested to evaluate for accessory splenic tissue.  EXAM: CT ABDOMEN WITH CONTRAST  TECHNIQUE: Multidetector CT imaging of the abdomen was performed using the standard protocol following bolus administration of intravenous contrast.  CONTRAST:  13mL OMNIPAQUE IOHEXOL 300 MG/ML IV. Oral contrast was also administered.  COMPARISON: CT abdomen and pelvis 07/04/2008.  FINDINGS: No evidence of accessory splenic tissue in the splenectomy bed or elsewhere in the abdomen. Scattered calcified granulomata throughout the liver which is moderately enlarged, likely mildly increased in size since the 2010 examination. No significant focal hepatic parenchymal abnormality. Normal  pancreas, adrenal glands and right kidney. Approximate 1.2 cm exophytic simple cyst arising from the upper pole of the left kidney, unchanged; no significant abnormality involving the left kidney. Severe aortoiliac atherosclerosis without aneurysm. No significant lymphadenopathy.  Normal-appearing stomach filled with food. Visualized small bowel normal in appearance. Large stool burden throughout the visualized colon. Extensive diverticulosis involving the visualized descending colon without evidence of acute diverticulitis. No ascites.  Bone window images demonstrate severe generalized osseous demineralization with compression fractures of T11, T12, L1, L2, L3, L4 and L5, all with prior augmentation. Visualized lung bases clear apart from scarring deep in the lower lobes. Heart markedly enlarged.  IMPRESSION: 1. No evidence of accessory splenic tissue in the splenectomy bed or elsewhere in the abdomen. 2. Hepatomegaly, with likely mild increase in size since 2010. No significant focal abnormality involving the liver. 3. Diverticulosis involving the visualized descending colon without evidence of acute diverticulitis.   Electronically Signed   By: Evangeline Dakin M.D.   On: 10/11/2014 13:49    ASSESSMENT: Autoimmune hemolytic anemia with lymphoproliferative disease Prednisone (decreased to 30 mg daily hemoglobin is improved to 12.2 g Prednisone is gone to be decreased to 20 mg for this week and 10 mg for next week and discontinue  Reevaluate patient in 2 weeks.  Patient will receive last dose of rituximab  today. We will continue to watch skin infection in the left axillary area if it gets worse doxycycline would be given. Patient has been taken off Diflucan as well as protonix    MEDICAL DECISION MAKING:  All lab data has been reviewed.  Hemoglobin is stable.     No matching staging information was found for the patient.  Forest Gleason, MD   11/10/2014 8:54 AM

## 2014-11-10 NOTE — Progress Notes (Signed)
Patient does have living will.  Former smoker. 

## 2014-11-11 ENCOUNTER — Other Ambulatory Visit: Payer: Self-pay

## 2014-11-15 ENCOUNTER — Emergency Department
Admission: EM | Admit: 2014-11-15 | Discharge: 2014-11-15 | Disposition: A | Discharge status: Home / Self Care | Payer: Medicare Other | Attending: Emergency Medicine | Admitting: Emergency Medicine

## 2014-11-15 ENCOUNTER — Emergency Department: Payer: Medicare Other

## 2014-11-15 DIAGNOSIS — L03116 Cellulitis of left lower limb: Secondary | ICD-10-CM

## 2014-11-15 DIAGNOSIS — Z87891 Personal history of nicotine dependence: Secondary | ICD-10-CM | POA: Insufficient documentation

## 2014-11-15 LAB — COMPREHENSIVE METABOLIC PANEL
ALBUMIN: 3.4 g/dL — AB (ref 3.5–5.0)
ALK PHOS: 61 U/L (ref 38–126)
ALT: 16 U/L — ABNORMAL LOW (ref 17–63)
AST: 23 U/L (ref 15–41)
Anion gap: 8 (ref 5–15)
BUN: 36 mg/dL — ABNORMAL HIGH (ref 6–20)
CALCIUM: 8.6 mg/dL — AB (ref 8.9–10.3)
CO2: 25 mmol/L (ref 22–32)
Chloride: 101 mmol/L (ref 101–111)
Creatinine, Ser: 0.91 mg/dL (ref 0.61–1.24)
GFR calc Af Amer: >60 mL/min (ref >60)
Glucose, Bld: 177 mg/dL — ABNORMAL HIGH (ref 65–99)
Potassium: 4.6 mmol/L (ref 3.5–5.1)
SODIUM: 134 mmol/L — AB (ref 135–145)
TOTAL PROTEIN: 6.2 g/dL — AB (ref 6.5–8.1)
Total Bilirubin: 1.1 mg/dL (ref 0.3–1.2)

## 2014-11-15 LAB — CBC WITH DIFFERENTIAL/PLATELET
BASOS ABS: 0 10*3/uL (ref 0–0.1)
BASOS PCT: 0 %
Eosinophils Absolute: 0 10*3/uL (ref 0–0.7)
Eosinophils Relative: 0 %
HEMATOCRIT: 34.8 % — AB (ref 40.0–52.0)
HEMOGLOBIN: 11.8 g/dL — AB (ref 13.0–18.0)
LYMPHS ABS: 0.6 10*3/uL — AB (ref 1.0–3.6)
LYMPHS PCT: 4 %
MCH: 37.8 pg — ABNORMAL HIGH (ref 26.0–34.0)
MCHC: 33.9 g/dL (ref 32.0–36.0)
MCV: 111.3 fL — ABNORMAL HIGH (ref 80.0–100.0)
MONOS PCT: 8 %
Monocytes Absolute: 1.3 10*3/uL — ABNORMAL HIGH (ref 0.2–1.0)
NEUTROS ABS: 13 10*3/uL — AB (ref 1.4–6.5)
Neutrophils Relative %: 88 %
Platelets: 196 10*3/uL (ref 150–440)
RBC: 3.12 MIL/uL — AB (ref 4.40–5.90)
RDW: 15.9 % — AB (ref 11.5–14.5)
WBC: 14.9 10*3/uL — ABNORMAL HIGH (ref 3.8–10.6)

## 2014-11-15 LAB — URINALYSIS COMPLETE WITH MICROSCOPIC (ARMC ONLY)
Bacteria, UA: NONE SEEN
Bilirubin Urine: NEGATIVE
Glucose, UA: NEGATIVE mg/dL
HGB URINE DIPSTICK: NEGATIVE
Ketones, ur: NEGATIVE mg/dL
Leukocytes, UA: NEGATIVE
Nitrite: NEGATIVE
PH: 5 (ref 5.0–8.0)
PROTEIN: NEGATIVE mg/dL
RBC / HPF: NONE SEEN RBC/hpf (ref 0–5)
Specific Gravity, Urine: 1.017 (ref 1.005–1.030)
Squamous Epithelial / LPF: NONE SEEN

## 2014-11-15 MED ORDER — VANCOMYCIN HCL IN DEXTROSE 1-5 GM/200ML-% IV SOLN
1000.0000 mg | Freq: Once | INTRAVENOUS | Status: AC
  Administered 2014-11-15: 1000 mg via INTRAVENOUS
  Filled 2014-11-15: qty 200

## 2014-11-15 MED ORDER — CLINDAMYCIN HCL 300 MG PO CAPS: 300.0000 mg | ORAL_CAPSULE | Freq: Three times a day (TID) | ORAL | Status: DC

## 2014-11-15 MED ORDER — MUPIROCIN 2 % EX OINT: TOPICAL_OINTMENT | CUTANEOUS | Status: DC

## 2014-11-15 NOTE — ED Notes (Signed)
Patient to ED with probable cellulitis to left inner upper leg. Sent by PMD due to need for IVAB. Patient had chemotherapy this week and is considered immunocompromised.

## 2014-11-15 NOTE — Discharge Instructions (Signed)

## 2014-11-15 NOTE — ED Notes (Signed)
Patient comes in with left inner thigh cellulitis, red, and warm to touch.

## 2014-11-15 NOTE — ED Provider Notes (Addendum)
[facility] Emergency Department Provider Note     Time seen: ----------------------------------------- 10:21 AM on 11/15/2014 -----------------------------------------    I have reviewed the triage vital signs and the nursing notes.   HISTORY  Chief Complaint Cellulitis    HPI [name] [name] is a 81 y.o. male who presents ER with question cellulitis to left upper leg. He was sent by his primary care doctor for IV antibiotics. Patient has history of CLL and just finished chemotherapy for same. Complains of left upper leg tenderness and swelling as well as erythema. His never had this happen before.   Past Medical History  Diagnosis Date  . CLL (chronic lymphoblastic leukemia) 2006    Rituxan treatment 2006  . Atrial septal aneurysm 2009    Insignificant, no, January, 2009  . Infection of PEG site     cellulitis.Marland KitchenResolved  . Vertebral compression fracture     multiple levels  . Herpes zoster     right chest  . Aortic insufficiency 2009    mild, Mild, echo, 2012  . Diastolic dysfunction     mild  . Chest pain     EF 55%, echo, October, 2009, possible inferior and posterior borderline hypokinesis, patient has never  had cardiac catheterization  . Edema     EF 55%, echo, October, 2009, possible borderline inferior and posterior hypokinesis... heart catheterization has never been done(July 10, 2009)  . Myositis     Caused pulmonary insufficiency with muscle weakness in the past / Dr.Willis-neurology, Imuran and prednisone  . Calculus of kidney   . Inguinal hernia without mention of obstruction or gangrene, unilateral or unspecified, (not specified as recurrent)     right   . Unspecified gastritis and gastroduodenitis without mention of hemorrhage   . Abdominal pain, epigastric   . Nonspecific elevation of levels of transaminase or lactic acid dehydrogenase (LDH)   . Feeding difficulties and mismanagement   . Edema of male genital organs    Resolved, occurred during illness related to myositis  . Swelling of arm     left.Marland KitchenMarland KitchenResolved.... no DVT  . LFT elevation     Related to Imuran in the past... dose was adjusted  . Dyslipidemia     statins not used due to LFT abnormalities  . Shortness of breath     Breathing abnormalities related to muscle weakness from myositis  . RBBB (right bundle branch block)     Old  . Bradycardia     Sinus bradycardia, old  . Ejection fraction     EF 60%, echo, October, 2012  . Mitral regurgitation     Mild / moderate, echo, 2012  . Right ventricular dysfunction     Mild, echo, 2012  . Febrile illness     May, 2013, question sinusitis  . Polymyositis 11/26/2012    Patient Active Problem List   Diagnosis Date Noted  . Acute hemolytic anemia 10/12/2014  . CLL (chronic lymphocytic leukemia)   . Hemolytic anemia associated with lymphoproliferative disorder   . Jaundice 10/10/2014  . Hypotension 10/10/2014  . DDD (degenerative disc disease), lumbosacral 08/27/2014  . H/O disease 08/27/2014  . Muscle ache 08/27/2014  . Raynaud's phenomenon 08/27/2014  . Special screening for malignant neoplasms, colon 08/27/2014  . Acute bronchitis 11/01/2013  . Polymyositis 11/26/2012  . Febrile illness   . Bradycardia   . Ejection fraction   . Aortic insufficiency   . CLL (chronic lymphoblastic leukemia)   . Atrial septal aneurysm   . Vertebral  compression fracture   . Herpes zoster   . Diastolic dysfunction   . Chest pain   . Edema   . Myositis   . Dyslipidemia   . Shortness of breath   . HYPOKALEMIA 01/19/2009  . Chronic lymphocytic leukemia 08/01/2008  . HYPERLIPIDEMIA-MIXED 07/22/2008  . ANEMIA 07/22/2008  . MYOSITIS 07/22/2008  . OSTEOPOROSIS 07/22/2008  . INGUINAL HERNIA, RIGHT 06/30/2008  . ABDOMINAL PAIN, EPIGASTRIC 05/29/2008  . FEEDING PROBLEM 12/24/2007  . ABNORMAL TRANSAMINASE-LFT'S 12/24/2007  . DYSPNEA 05/22/2007    Past Surgical History  Procedure Laterality Date  .  Splenectomy    . Hernia repair      left side  . Peg placement  06/2007  . Peg tube removal  12/2007  . Kyphosis surgery      multilevel vertebral  . Odontoid fracture surgery      anterior screw fixation  . Nasal sinus surgery      x2  . Cataract extraction      bi lateral    Allergies Sulfonamide derivatives  Social History History  Substance Use Topics  . Smoking status: Former Smoker -- 0.50 packs/day for 31 years    Types: Cigarettes    Quit date: 06/29/1980  . Smokeless tobacco: Never Used  . Alcohol Use: No     Comment: rarely    Review of Systems Constitutional: Negative for fever. Eyes: Negative for visual changes. ENT: Negative for sore throat. Cardiovascular: Negative for chest pain. Respiratory: Negative for shortness of breath. Gastrointestinal: Negative for abdominal pain, vomiting and diarrhea. Genitourinary: Negative for dysuria. Musculoskeletal: Negative for back pain. No fluctuant swelling erythema and pain Skin: Right lower extremity erythema Neurological: Negative for headaches, focal weakness or numbness.  10-point ROS otherwise negative.  ____________________________________________   PHYSICAL EXAM:  VITAL SIGNS: ED Triage Vitals  Enc Vitals Group     BP --      Pulse --      Resp --      Temp --      Temp src --      SpO2 --      Weight --      Height --      Head Cir --      Peak Flow --      Pain Score 11/15/14 1018 8     Pain Loc --      Pain Edu? --      Excl. in [place]? --     Constitutional: Alert and oriented. Well appearing and in no distress. Eyes: Conjunctivae are normal. PERRL. Normal extraocular movements. ENT   Head: Normocephalic and atraumatic.   Nose: No congestion/rhinnorhea.   Mouth/Throat: Mucous membranes are moist.   Neck: No stridor. Cardiovascular: Normal rate, regular rhythm. Normal and symmetric distal pulses are present in all extremities. No murmurs, rubs, or gallops. Respiratory: Normal  respiratory effort without tachypnea nor retractions. Breath sounds are clear and equal bilaterally. No wheezes/rales/rhonchi. Gastrointestinal: Soft and nontender. No distention. No abdominal bruits.  Musculoskeletal: There is edema and erythema noted to the left inner thigh. Area is tender to touch. Neurologic:  Normal speech and language. No gross focal neurologic deficits are appreciated. Speech is normal. No gait instability. Skin:  Erythema begins in the inner thigh and wraps around his leg posteriorly. No identified abscess Psychiatric: Mood and affect are normal. Speech and behavior are normal. Patient exhibits appropriate insight and judgment. ___________________________________________  ED COURSE:  Pertinent labs & imaging results that were available during  my care of the patient were reviewed by me and considered in my medical decision making (see chart for details). Patient will need labs and ultrasound. Appears to be either cellulitis or DVT ____________________________________________    LABS (pertinent positives/negatives)  Labs Reviewed  CBC WITH DIFFERENTIAL/PLATELET - Abnormal; Notable for the following:    WBC 14.9 (*)    RBC 3.12 (*)    Hemoglobin 11.8 (*)    HCT 34.8 (*)    MCV 111.3 (*)    MCH 37.8 (*)    RDW 15.9 (*)    Neutro Abs 13.0 (*)    Lymphs Abs 0.6 (*)    Monocytes Absolute 1.3 (*)    All other components within normal limits  COMPREHENSIVE METABOLIC PANEL - Abnormal; Notable for the following:    Sodium 134 (*)    Glucose, Bld 177 (*)    BUN 36 (*)    Calcium 8.6 (*)    Total Protein 6.2 (*)    Albumin 3.4 (*)    ALT 16 (*)    All other components within normal limits  URINALYSIS COMPLETEWITH MICROSCOPIC ([facility] ONLY) - Abnormal; Notable for the following:    Color, Urine AMBER (*)    APPearance CLEAR (*)    All other components within normal limits  CULTURE, BLOOD (ROUTINE X 2)  CULTURE, BLOOD (ROUTINE X 2)    RADIOLOGY Images were  viewed by me  Ultrasound of left lower extremity IMPRESSION: No evidence of deep venous thrombosis. ____________________________________________  FINAL ASSESSMENT AND PLAN  Cellulitis  Plan: Patient with labs and imaging as dictated above. Patient with a large area of cellulitis in his left inner thigh. No evidence of DVT. Started on IV vancomycin. I offered the patient admission versus discharge, he opted for outpatient follow-up with Dr. Donnetta Hail on Monday. Will be discharged with clindamycin and Bactroban ointment.   Earleen Newport, MD   Earleen Newport, MD 11/15/14 1242  Earleen Newport, MD 11/15/14 [phone]

## 2014-11-17 ENCOUNTER — Inpatient Hospital Stay (HOSPITAL_BASED_OUTPATIENT_CLINIC_OR_DEPARTMENT_OTHER): Payer: Medicare Other | Admitting: Oncology

## 2014-11-17 ENCOUNTER — Inpatient Hospital Stay: Payer: Medicare Other

## 2014-11-17 ENCOUNTER — Telehealth: Payer: Self-pay | Admitting: *Deleted

## 2014-11-17 VITALS — BP 127/66 | HR 59 | Temp 95.7°F | Wt 129.9 lb

## 2014-11-17 DIAGNOSIS — I351 Nonrheumatic aortic (valve) insufficiency: Secondary | ICD-10-CM

## 2014-11-17 DIAGNOSIS — L03116 Cellulitis of left lower limb: Secondary | ICD-10-CM | POA: Diagnosis not present

## 2014-11-17 DIAGNOSIS — R21 Rash and other nonspecific skin eruption: Secondary | ICD-10-CM

## 2014-11-17 DIAGNOSIS — Z79899 Other long term (current) drug therapy: Secondary | ICD-10-CM

## 2014-11-17 DIAGNOSIS — I253 Aneurysm of heart: Secondary | ICD-10-CM

## 2014-11-17 DIAGNOSIS — R000 Tachycardia, unspecified: Secondary | ICD-10-CM

## 2014-11-17 DIAGNOSIS — D72829 Elevated white blood cell count, unspecified: Secondary | ICD-10-CM

## 2014-11-17 DIAGNOSIS — D591 Other autoimmune hemolytic anemias: Secondary | ICD-10-CM

## 2014-11-17 DIAGNOSIS — I2510 Atherosclerotic heart disease of native coronary artery without angina pectoris: Secondary | ICD-10-CM

## 2014-11-17 DIAGNOSIS — K529 Noninfective gastroenteritis and colitis, unspecified: Secondary | ICD-10-CM

## 2014-11-17 DIAGNOSIS — Z809 Family history of malignant neoplasm, unspecified: Secondary | ICD-10-CM

## 2014-11-17 DIAGNOSIS — C9110 Chronic lymphocytic leukemia of B-cell type not having achieved remission: Secondary | ICD-10-CM

## 2014-11-17 DIAGNOSIS — Z8042 Family history of malignant neoplasm of prostate: Secondary | ICD-10-CM

## 2014-11-17 DIAGNOSIS — R5383 Other fatigue: Secondary | ICD-10-CM

## 2014-11-17 DIAGNOSIS — Z803 Family history of malignant neoplasm of breast: Secondary | ICD-10-CM

## 2014-11-17 DIAGNOSIS — R0602 Shortness of breath: Secondary | ICD-10-CM

## 2014-11-17 DIAGNOSIS — Z800 Family history of malignant neoplasm of digestive organs: Secondary | ICD-10-CM

## 2014-11-17 DIAGNOSIS — Z7982 Long term (current) use of aspirin: Secondary | ICD-10-CM

## 2014-11-17 DIAGNOSIS — Z8711 Personal history of peptic ulcer disease: Secondary | ICD-10-CM

## 2014-11-17 DIAGNOSIS — Z9081 Acquired absence of spleen: Secondary | ICD-10-CM

## 2014-11-17 DIAGNOSIS — R531 Weakness: Secondary | ICD-10-CM

## 2014-11-17 DIAGNOSIS — E785 Hyperlipidemia, unspecified: Secondary | ICD-10-CM

## 2014-11-17 DIAGNOSIS — I4510 Unspecified right bundle-branch block: Secondary | ICD-10-CM

## 2014-11-17 DIAGNOSIS — I340 Nonrheumatic mitral (valve) insufficiency: Secondary | ICD-10-CM

## 2014-11-17 DIAGNOSIS — Z87891 Personal history of nicotine dependence: Secondary | ICD-10-CM

## 2014-11-17 DIAGNOSIS — E538 Deficiency of other specified B group vitamins: Secondary | ICD-10-CM

## 2014-11-17 DIAGNOSIS — Z87442 Personal history of urinary calculi: Secondary | ICD-10-CM

## 2014-11-17 LAB — COMPREHENSIVE METABOLIC PANEL
ALBUMIN: 3.2 g/dL — AB (ref 3.5–5.0)
ALT: 18 U/L (ref 17–63)
ANION GAP: 3 — AB (ref 5–15)
AST: 23 U/L (ref 15–41)
Alkaline Phosphatase: 72 U/L (ref 38–126)
BILIRUBIN TOTAL: 1 mg/dL (ref 0.3–1.2)
BUN: 37 mg/dL — ABNORMAL HIGH (ref 6–20)
CALCIUM: 8.1 mg/dL — AB (ref 8.9–10.3)
CHLORIDE: 100 mmol/L — AB (ref 101–111)
CO2: 28 mmol/L (ref 22–32)
CREATININE: 0.88 mg/dL (ref 0.61–1.24)
GFR calc non Af Amer: >60 mL/min (ref >60)
Glucose, Bld: 104 mg/dL — ABNORMAL HIGH (ref 65–99)
POTASSIUM: 4.7 mmol/L (ref 3.5–5.1)
SODIUM: 131 mmol/L — AB (ref 135–145)
Total Protein: 6.1 g/dL — ABNORMAL LOW (ref 6.5–8.1)

## 2014-11-17 LAB — CBC WITH DIFFERENTIAL/PLATELET
BASOS PCT: 0 %
Basophils Absolute: 0 10*3/uL (ref 0–0.1)
EOS ABS: 0 10*3/uL (ref 0–0.7)
Eosinophils Relative: 0 %
HEMATOCRIT: 33.5 % — AB (ref 40.0–52.0)
HEMOGLOBIN: 11.4 g/dL — AB (ref 13.0–18.0)
Lymphocytes Relative: 6 %
Lymphs Abs: 1 10*3/uL (ref 1.0–3.6)
MCH: 37.1 pg — AB (ref 26.0–34.0)
MCHC: 34 g/dL (ref 32.0–36.0)
MCV: 109.3 fL — AB (ref 80.0–100.0)
Monocytes Absolute: 1.1 10*3/uL — ABNORMAL HIGH (ref 0.2–1.0)
Monocytes Relative: 6 %
NEUTROS ABS: 15.1 10*3/uL — AB (ref 1.4–6.5)
Neutrophils Relative %: 88 %
Platelets: 248 10*3/uL (ref 150–440)
RBC: 3.07 MIL/uL — ABNORMAL LOW (ref 4.40–5.90)
RDW: 16.6 % — ABNORMAL HIGH (ref 11.5–14.5)
WBC: 17.3 10*3/uL — AB (ref 3.8–10.6)

## 2014-11-17 MED ORDER — VANCOMYCIN HCL 500 MG IV SOLR
1500.0000 mg | Freq: Once | INTRAVENOUS | Status: AC
  Administered 2014-11-17: 1500 mg via INTRAVENOUS
  Filled 2014-11-17: qty 1500

## 2014-11-17 NOTE — Progress Notes (Signed)
Patient does not have living will.  Former smoker.  Patient acute add on for cellulitis in left groin and down leg. Received IV Vancomycin on Saturday and on round of Dlindamycin tid.

## 2014-11-17 NOTE — Telephone Encounter (Signed)
States he is folowed for CLL and has colitis and asking if he can be seen. Per Dr Oliva Bustard, see pt today with CBC, CMP. Patient agrees to 11:15/11:30 appt today

## 2014-11-17 NOTE — Progress Notes (Signed)
Patient to receive IV vancomycin .  Will return tomorrow for re-evaluation and possible infusion of another dose of antibiotics.  Goal is to keep patient in outpatient status

## 2014-11-18 ENCOUNTER — Encounter: Payer: Self-pay | Admitting: Oncology

## 2014-11-18 ENCOUNTER — Other Ambulatory Visit: Payer: Self-pay | Admitting: Oncology

## 2014-11-18 ENCOUNTER — Telehealth: Payer: Self-pay | Admitting: Pharmacist

## 2014-11-18 ENCOUNTER — Inpatient Hospital Stay (HOSPITAL_BASED_OUTPATIENT_CLINIC_OR_DEPARTMENT_OTHER): Payer: Medicare Other | Admitting: Oncology

## 2014-11-18 ENCOUNTER — Inpatient Hospital Stay: Payer: Medicare Other

## 2014-11-18 VITALS — BP 114/66 | HR 84 | Temp 97.7°F | Wt 131.4 lb

## 2014-11-18 DIAGNOSIS — E785 Hyperlipidemia, unspecified: Secondary | ICD-10-CM

## 2014-11-18 DIAGNOSIS — L03116 Cellulitis of left lower limb: Secondary | ICD-10-CM | POA: Diagnosis not present

## 2014-11-18 DIAGNOSIS — D72829 Elevated white blood cell count, unspecified: Secondary | ICD-10-CM | POA: Diagnosis not present

## 2014-11-18 DIAGNOSIS — I4510 Unspecified right bundle-branch block: Secondary | ICD-10-CM

## 2014-11-18 DIAGNOSIS — Z809 Family history of malignant neoplasm, unspecified: Secondary | ICD-10-CM

## 2014-11-18 DIAGNOSIS — R21 Rash and other nonspecific skin eruption: Secondary | ICD-10-CM

## 2014-11-18 DIAGNOSIS — E538 Deficiency of other specified B group vitamins: Secondary | ICD-10-CM

## 2014-11-18 DIAGNOSIS — Z800 Family history of malignant neoplasm of digestive organs: Secondary | ICD-10-CM

## 2014-11-18 DIAGNOSIS — I351 Nonrheumatic aortic (valve) insufficiency: Secondary | ICD-10-CM

## 2014-11-18 DIAGNOSIS — Z9081 Acquired absence of spleen: Secondary | ICD-10-CM

## 2014-11-18 DIAGNOSIS — C9110 Chronic lymphocytic leukemia of B-cell type not having achieved remission: Secondary | ICD-10-CM

## 2014-11-18 DIAGNOSIS — Z87891 Personal history of nicotine dependence: Secondary | ICD-10-CM

## 2014-11-18 DIAGNOSIS — Z8042 Family history of malignant neoplasm of prostate: Secondary | ICD-10-CM

## 2014-11-18 DIAGNOSIS — R531 Weakness: Secondary | ICD-10-CM

## 2014-11-18 DIAGNOSIS — L02419 Cutaneous abscess of limb, unspecified: Secondary | ICD-10-CM

## 2014-11-18 DIAGNOSIS — D591 Other autoimmune hemolytic anemias: Secondary | ICD-10-CM

## 2014-11-18 DIAGNOSIS — R000 Tachycardia, unspecified: Secondary | ICD-10-CM

## 2014-11-18 DIAGNOSIS — I2510 Atherosclerotic heart disease of native coronary artery without angina pectoris: Secondary | ICD-10-CM

## 2014-11-18 DIAGNOSIS — Z79899 Other long term (current) drug therapy: Secondary | ICD-10-CM

## 2014-11-18 DIAGNOSIS — R0602 Shortness of breath: Secondary | ICD-10-CM

## 2014-11-18 DIAGNOSIS — L03119 Cellulitis of unspecified part of limb: Principal | ICD-10-CM

## 2014-11-18 DIAGNOSIS — I253 Aneurysm of heart: Secondary | ICD-10-CM

## 2014-11-18 DIAGNOSIS — Z803 Family history of malignant neoplasm of breast: Secondary | ICD-10-CM

## 2014-11-18 DIAGNOSIS — Z87442 Personal history of urinary calculi: Secondary | ICD-10-CM

## 2014-11-18 DIAGNOSIS — R5383 Other fatigue: Secondary | ICD-10-CM

## 2014-11-18 DIAGNOSIS — Z7982 Long term (current) use of aspirin: Secondary | ICD-10-CM

## 2014-11-18 DIAGNOSIS — I340 Nonrheumatic mitral (valve) insufficiency: Secondary | ICD-10-CM

## 2014-11-18 DIAGNOSIS — Z8711 Personal history of peptic ulcer disease: Secondary | ICD-10-CM

## 2014-11-18 MED ORDER — VANCOMYCIN HCL 500 MG IV SOLR
1250.0000 mg | Freq: Once | INTRAVENOUS | Status: AC
  Administered 2014-11-18: 1250 mg via INTRAVENOUS
  Filled 2014-11-18: qty 1250

## 2014-11-18 NOTE — Progress Notes (Signed)
[place] @ [facility] [phone]) [phone]  [phone]     Oral Remache OB: December 12, 1929  MR#: [id number]  AOZ#:[id number]  Patient Care Team: Juline Patch, MD as PCP - General (Family Medicine) Kathrynn Ducking, MD (Neurology) Forest Gleason, MD as Consulting Physician (Unknown Physician Specialty) Carlena Bjornstad, MD as Consulting Physician (Cardiology)  CHIEF COMPLAINT:  Chief Complaint  Patient presents with  . Follow-up    Oncology History   Chief Complaint/Problem List  1.  Autoimmune hemolytic anemia. Previously received Prednisone taper.   2.  Splenectomy in 5/02.  3.  Gastric ulcer diagnosed on endoscopy and multiple biopsies are pending, November 2005. 4.  Chronic lymphocytic leukemia. 5.vitamin B 12 deficiency 6.  Recent admission in the hospital in June of 2016 with hemolytic anemia and hemoglobin of 5.5 patient received blood transfusion was started on steroid 7.  Patient was started on rituximab (October 20, 2014     Chronic lymphocytic leukemia   08/01/2008 Initial Diagnosis Chronic lymphocytic leukemia    Oncology Flowsheet 10/11/2014 10/12/2014 10/13/2014 10/20/2014 10/27/2014 11/03/2014 11/10/2014  cyanocobalamin ((VITAMIN B-12)) IM   - - - 1,000 mcg - 1,000 mcg  methylPREDNISolone sodium succinate 40 mg/mL (SOLU-MEDROL) IV - - - - - - -  predniSONE (DELTASONE) PO 80 mg 80 mg 80 mg - - - -  riTUXimab (RITUXAN) IV - - - 375 mg/m2 375 mg/m2 375 mg/m2 375 mg/m2  zolendronic acid (ZOMETA) IV   - - - - - -    INTERVAL HISTORY:  81 year old gentleman was recently admitted in the hospital with increasing shortness of breath.  Hemoglobin was 5.5.  Patient received blood transfusion patient has autoimmune hemolytic anemia secondary to call chronic lymphocytic leukemia.  Had received Rituxan in the past. Patient was started on prednisone 80 mg daily.  Hemoglobin improved to 10.5 patient is here for ongoing evaluation and treatment consideration November 03, 2014 Patient is  here for ongoing evaluation taking prednisone 40 mg daily which has been now decreased 30 mg by mouth daily hemoglobin is improving.  Tolerating Rituxan with minimal rash and no other side effect. November 10, 2014 Patient is here for his last chemotherapy cycle.  No chills.  No fever.  Hemoglobin is improved.  No rash.  No diarrhea.  Patient had skin infection in the left axillary area Also had number of questions regarding discontinuing Diflucan and Protonix On folic acid which will be continued.  November 18, 2014 Patient is here for further follow-up regarding cellulitis Received a single dose of vancomycin yesterday Redness all around the thigh has improved but patient has more pain and feeling somewhat weak and tired REVIEW OF SYSTEMS:    general status: Patient is feeling weak and tired.  No change in a performance status.  No chills.  No fever. HEENT:.  No evidence of stomatitis Lungs: No cough or shortness of breath Cardiac: No chest pain or paroxysmal nocturnal dyspnea GI: No nausea no vomiting no diarrhea no abdominal pain Skin: No rash Lower extremity redness and tenderness Neurological system: No tingling.  No numbness.  No other focal signs Musculoskeletal system no bony pains  As per HPI. Otherwise, a complete review of systems is negatve.  PAST MEDICAL HISTORY: Past Medical History  Diagnosis Date  . CLL (chronic lymphoblastic leukemia) 2006    Rituxan treatment 2006  . Atrial septal aneurysm 2009    Insignificant, no, January, 2009  . Infection of PEG site     cellulitis.Marland KitchenResolved  . Vertebral  compression fracture     multiple levels  . Herpes zoster     right chest  . Aortic insufficiency 2009    mild, Mild, echo, 2012  . Diastolic dysfunction     mild  . Chest pain     EF 55%, echo, October, 2009, possible inferior and posterior borderline hypokinesis, patient has never  had cardiac catheterization  . Edema     EF 55%, echo, October, 2009, possible borderline  inferior and posterior hypokinesis... heart catheterization has never been done(July 10, 2009)  . Myositis     Caused pulmonary insufficiency with muscle weakness in the past / Dr.Willis-neurology, Imuran and prednisone  . Calculus of kidney   . Inguinal hernia without mention of obstruction or gangrene, unilateral or unspecified, (not specified as recurrent)     right   . Unspecified gastritis and gastroduodenitis without mention of hemorrhage   . Abdominal pain, epigastric   . Nonspecific elevation of levels of transaminase or lactic acid dehydrogenase (LDH)   . Feeding difficulties and mismanagement   . Edema of male genital organs     Resolved, occurred during illness related to myositis  . Swelling of arm     left.Marland KitchenMarland KitchenResolved.... no DVT  . LFT elevation     Related to Imuran in the past... dose was adjusted  . Dyslipidemia     statins not used due to LFT abnormalities  . Shortness of breath     Breathing abnormalities related to muscle weakness from myositis  . RBBB (right bundle branch block)     Old  . Bradycardia     Sinus bradycardia, old  . Ejection fraction     EF 60%, echo, October, 2012  . Mitral regurgitation     Mild / moderate, echo, 2012  . Right ventricular dysfunction     Mild, echo, 2012  . Febrile illness     May, 2013, question sinusitis  . Polymyositis 11/26/2012    PAST SURGICAL HISTORY: Past Surgical History  Procedure Laterality Date  . Splenectomy    . Hernia repair      left side  . Peg placement  06/2007  . Peg tube removal  12/2007  . Kyphosis surgery      multilevel vertebral  . Odontoid fracture surgery      anterior screw fixation  . Nasal sinus surgery      x2  . Cataract extraction      bi lateral    FAMILY HISTORY Family History  Problem Relation Age of Onset  . Breast cancer Sister   . Colon cancer Brother   . Prostate cancer Brother   . Stroke Mother   . Hypertension Mother   . Emphysema Father   . Diabetes Grandchild     . Heart attack Neg Hx   . Cancer Brother   . Cancer Sister   . Cancer Daughter     ADVANCED DIRECTIVES:  Patient does have advance healthcare directive, Patient   does not desire to make any changes  HEALTH MAINTENANCE: History  Substance Use Topics  . Smoking status: Former Smoker -- 0.50 packs/day for 31 years    Types: Cigarettes    Quit date: 06/29/1980  . Smokeless tobacco: Never Used  . Alcohol Use: No     Comment: rarely      Allergies  Allergen Reactions  . Sulfonamide Derivatives     Current Outpatient Prescriptions  Medication Sig Dispense Refill  . ASMANEX 60 METERED DOSES 220 MCG/INH  inhaler Inhale 1 puff into the lungs 2 (two) times daily.  0  . aspirin 81 MG tablet Take 81 mg by mouth at bedtime.     . calcium-vitamin D (CALCIUM 500+D) 500-200 MG-UNIT per tablet Take 1 tablet by mouth 2 (two) times daily.      . Cholecalciferol (VITAMIN D3) 1000 UNITS tablet Take 1,000 Units by mouth daily.      . citalopram (CELEXA) 20 MG tablet Take 1 tablet (20 mg total) by mouth daily. 30 tablet 0  . clindamycin (CLEOCIN) 300 MG capsule Take 1 capsule (300 mg total) by mouth 3 (three) times daily. 30 capsule 0  . cyanocobalamin (,VITAMIN B-12,) 1000 MCG/ML injection Inject 1,000 mcg into the muscle every 14 (fourteen) days. At the first of the month    . ferrous sulfate 325 (65 FE) MG tablet Take 325 mg by mouth daily with breakfast.      . fish oil-omega-3 fatty acids 1000 MG capsule Take 1 g by mouth daily at 12 noon.     . fluconazole (DIFLUCAN) 100 MG tablet Take 1 tablet (100 mg total) by mouth daily. 30 tablet 0  . folic acid (FOLVITE) 1 MG tablet Take 4 tablets (4 mg total) by mouth daily. 120 tablet 0  . furosemide (LASIX) 40 MG tablet Take 1 tablet (40 mg total) by mouth daily. 90 tablet 3  . HYDROcodone-acetaminophen (NORCO/VICODIN) 5-325 MG per tablet Take 1 tablet by mouth every 6 (six) hours as needed for pain.    . montelukast (SINGULAIR) 10 MG tablet Take 10  mg by mouth at bedtime.   11  . Multiple Vitamin (MULTIVITAMIN) capsule Take 1 capsule by mouth daily.      . mupirocin ointment (BACTROBAN) 2 % Apply to affected area 3 times daily 22 g 0  . mycophenolate (CELLCEPT) 500 MG tablet Take 1 tablet (500 mg total) by mouth daily. 90 tablet 3  . nystatin cream (MYCOSTATIN) Apply 1 application topically daily as needed for dry skin.   0  . pantoprazole (PROTONIX) 40 MG tablet Take 1 tablet (40 mg total) by mouth daily at 12 noon. 30 tablet 0  . potassium chloride SA (KLOR-CON M20) 20 MEQ tablet Take 1 tablet (20 mEq total) by mouth daily. Take one tablet by mouth once daily. Take an extra tablet twice a week. (Patient taking differently: Take 20-40 mEq by mouth daily. Take an extra tablet twice a week on Monday and Friday) 114 tablet 3  . predniSONE (DELTASONE) 20 MG tablet Take 1.5 tablets (30 mg total) by mouth daily with supper. 120 tablet 0  . PROAIR HFA 108 (90 BASE) MCG/ACT inhaler Inhale 2 puffs into the lungs 4 (four) times daily.   11  . Resource Beneprotein PACK Take 3g three times a day    . temazepam (RESTORIL) 30 MG capsule Take 30 mg by mouth at bedtime.      . triamcinolone cream (KENALOG) 0.1 % Apply 1 application topically daily at 12 noon.   0  . vitamin B-12 (CYANOCOBALAMIN) 1000 MCG tablet Take 1,000 mcg by mouth daily at 12 noon.      No current facility-administered medications for this visit.    OBJECTIVE:  Filed Vitals:   11/18/14 1423  BP: 114/66  Pulse: 84  Temp: 97.7 F (36.5 C)     Body mass index is 21.86 kg/(m^2).    ECOG FS:1 - Symptomatic but completely ambulatory  PHYSICAL EXAM: Gen. status: Patient is alert oriented not  any acute distress Lymphatic system: Supraclavicular, cervical, axillary, inguinal lymph nodes are not palpable Head exam was generally normal. There was no scleral icterus or corneal arcus. Mucous membranes were moist. Cardiac: Tachycardia Abdominal exam revealed normal bowel sounds. The  abdomen was soft, non-tender, and without masses, organomegaly, or appreciable enlargement of the abdominal aorta. Examination of the skin revealed no evidence of significant rashes, suspicious appearing nevi or other concerning lesions. Neurologically, the patient was awake, alert, and oriented to person, place and time. There were no obvious focal neurologic abnormalities. Lower extremity on the left side there is a lady of redness extending from the upper thigh all the way to scrotal area.  There is area of induration and tenderness Kyphoscoliosis: Osteoporosis and multiple vertebral compression fracture  Skin: Grade 1 rash LAB RESULTS:  Appointment on 11/17/2014  Component Date Value Ref Range Status  . WBC 11/17/2014 17.3* 3.8 - 10.6 K/uL Final  . RBC 11/17/2014 3.07* 4.40 - 5.90 MIL/uL Final  . Hemoglobin 11/17/2014 11.4* 13.0 - 18.0 g/dL Final  . HCT 11/17/2014 33.5* 40.0 - 52.0 % Final  . MCV 11/17/2014 109.3* 80.0 - 100.0 fL Final  . MCH 11/17/2014 37.1* 26.0 - 34.0 pg Final  . MCHC 11/17/2014 34.0  32.0 - 36.0 g/dL Final  . RDW 11/17/2014 16.6* 11.5 - 14.5 % Final  . Platelets 11/17/2014 248  150 - 440 K/uL Final  . Neutrophils Relative % 11/17/2014 88   Final  . Neutro Abs 11/17/2014 15.1* 1.4 - 6.5 K/uL Final  . Lymphocytes Relative 11/17/2014 6   Final  . Lymphs Abs 11/17/2014 1.0  1.0 - 3.6 K/uL Final  . Monocytes Relative 11/17/2014 6   Final  . Monocytes Absolute 11/17/2014 1.1* 0.2 - 1.0 K/uL Final  . Eosinophils Relative 11/17/2014 0   Final  . Eosinophils Absolute 11/17/2014 0.0  0 - 0.7 K/uL Final  . Basophils Relative 11/17/2014 0   Final  . Basophils Absolute 11/17/2014 0.0  0 - 0.1 K/uL Final  . Sodium 11/17/2014 131* 135 - 145 mmol/L Final  . Potassium 11/17/2014 4.7  3.5 - 5.1 mmol/L Final  . Chloride 11/17/2014 100* 101 - 111 mmol/L Final  . CO2 11/17/2014 28  22 - 32 mmol/L Final  . Glucose, Bld 11/17/2014 104* 65 - 99 mg/dL Final  . BUN 11/17/2014 37* 6  - 20 mg/dL Final  . Creatinine, Ser 11/17/2014 0.88  0.61 - 1.24 mg/dL Final  . Calcium 11/17/2014 8.1* 8.9 - 10.3 mg/dL Final  . Total Protein 11/17/2014 6.1* 6.5 - 8.1 g/dL Final  . Albumin 11/17/2014 3.2* 3.5 - 5.0 g/dL Final  . AST 11/17/2014 23  15 - 41 U/L Final  . ALT 11/17/2014 18  17 - 63 U/L Final  . Alkaline Phosphatase 11/17/2014 72  38 - 126 U/L Final  . Total Bilirubin 11/17/2014 1.0  0.3 - 1.2 mg/dL Final  . GFR calc non Af Amer 11/17/2014 >60  >60 mL/min Final  . GFR calc Af Amer 11/17/2014 >60  >60 mL/min Final   Comment: (NOTE) The eGFR has been calculated using the CKD EPI equation. This calculation has not been validated in all clinical situations. eGFR's persistently <60 mL/min signify possible Chronic Kidney Disease.   . Anion gap 11/17/2014 3* 5 - 15 Final      STUDIES: US Venous Img Lower Unilateral Left  11/15/2014   CLINICAL DATA:  Left lower extremity edema and erythema for 4 days  EXAM: LEFT LOWER EXTREMITY  VENOUS DOPPLER ULTRASOUND  TECHNIQUE: Gray-scale sonography with graded compression, as well as color Doppler and duplex ultrasound were performed to evaluate the lower extremity deep venous systems from the level of the common femoral vein and including the common femoral, femoral, profunda femoral, popliteal and calf veins including the posterior tibial, peroneal and gastrocnemius veins when visible. The superficial great saphenous vein was also interrogated. Spectral Doppler was utilized to evaluate flow at rest and with distal augmentation maneuvers in the common femoral, femoral and popliteal veins.  COMPARISON:  None.  FINDINGS: Contralateral Common Femoral Vein: Respiratory phasicity is normal and symmetric with the symptomatic side. No evidence of thrombus. Normal compressibility.  Common Femoral Vein: No evidence of thrombus. Normal compressibility, respiratory phasicity and response to augmentation.  Saphenofemoral Junction: No evidence of thrombus.  Normal compressibility and flow on color Doppler imaging.  Profunda Femoral Vein: No evidence of thrombus. Normal compressibility and flow on color Doppler imaging.  Femoral Vein: No evidence of thrombus. Normal compressibility, respiratory phasicity and response to augmentation.  Popliteal Vein: No evidence of thrombus. Normal compressibility, respiratory phasicity and response to augmentation.  Calf Veins: No evidence of thrombus. Normal compressibility and flow on color Doppler imaging.  Superficial Great Saphenous Vein: No evidence of thrombus. Normal compressibility and flow on color Doppler imaging.  Venous Reflux:  None.  Other Findings: Multiple prominent left inguinal lymph nodes the largest measuring 21 mm in greatest diameter. These demonstrate fatty hila with no significant cortical thickening and are presumably reactive.  IMPRESSION: No evidence of deep venous thrombosis.   Electronically Signed   By: Skipper Cliche M.D.   On: 11/15/2014 12:34    ASSESSMENT: Autoimmune hemolytic anemia with lymphoproliferative disease He  is on prednisone 10 mg.  Patient is somewhat immunocompromised because of prednisone as well as rituximab therapy. Has cellulitis  There is somewhat improvement with that single dose of vancomycin yesterday we will continue vancomycin however it appears that patient may be developing abscess.  I had again prolonged discussion with him and his daughter about hospitalization may have to run CT scan of the thigh and may get surgical opinion Patient does not want to go to Hospital at present time Will get one more dose of vancomycin and get surgical pia and tomorrow and decide the further course of treatment meanwhile he was instructed to call me or go to emergency room if spikes any fever   MEDICAL DECISION MAKING:  Cellulitis of lower extremity Total duration of visit was 35 minutes.  50% or more time was spent in counseling patient and family regarding prognosis and  options of treatment and available resources Continue vancomycin Surgical   OPINION ,If needed CT scan of the thigh     No matching staging information was found for the patient.  Forest Gleason, MD   11/18/2014 2:44 PM

## 2014-11-18 NOTE — Progress Notes (Signed)
[place] @ [facility] [phone]  [phone]     [name] [name] OB: 30-Apr-1975  MR#: [id number]  LXB#:[id number]  Patient Care Team: Juline Patch, MD as PCP - General (Family Medicine) Kathrynn Ducking, MD (Neurology) Forest Gleason, MD as Consulting Physician (Unknown Physician Specialty) Carlena Bjornstad, MD as Consulting Physician (Cardiology)  CHIEF COMPLAINT:  Chief Complaint  Patient presents with  . Acute Visit    Oncology History   Chief Complaint/Problem List  1.  Autoimmune hemolytic anemia. Previously received Prednisone taper.   2.  Splenectomy in 5/02.  3.  Gastric ulcer diagnosed on endoscopy and multiple biopsies are pending, November 2005. 4.  Chronic lymphocytic leukemia. 5.vitamin B 12 deficiency 6.  Recent admission in the hospital in June of 2016 with hemolytic anemia and hemoglobin of 5.5 patient received blood transfusion was started on steroid 7.  Patient was started on rituximab (October 20, 2014     Chronic lymphocytic leukemia   08/01/2008 Initial Diagnosis Chronic lymphocytic leukemia    Oncology Flowsheet 10/11/2014 10/12/2014 10/13/2014 10/20/2014 10/27/2014 11/03/2014 11/10/2014  cyanocobalamin ((VITAMIN B-12)) IM   - - - 1,000 mcg - 1,000 mcg  methylPREDNISolone sodium succinate 40 mg/mL (SOLU-MEDROL) IV - - - - - - -  predniSONE (DELTASONE) PO 80 mg 80 mg 80 mg - - - -  riTUXimab (RITUXAN) IV - - - 375 mg/m2 375 mg/m2 375 mg/m2 375 mg/m2  zolendronic acid (ZOMETA) IV   - - - - - -    INTERVAL HISTORY:  81 year old gentleman was recently admitted in the hospital with increasing shortness of breath.  Hemoglobin was 5.5.  Patient received blood transfusion patient has autoimmune hemolytic anemia secondary to call chronic lymphocytic leukemia.  Had received Rituxan in the past. Patient was started on prednisone 80 mg daily.  Hemoglobin improved to 10.5 patient is here for ongoing evaluation and treatment consideration November 03, 2014 Patient is  here for ongoing evaluation taking prednisone 40 mg daily which has been now decreased 30 mg by mouth daily hemoglobin is improving.  Tolerating Rituxan with minimal rash and no other side effect. November 10, 2014 Patient is here for his last chemotherapy cycle.  No chills.  No fever.  Hemoglobin is improved.  No rash.  No diarrhea.  Patient had skin infection in the left axillary area Also had number of questions regarding discontinuing Diflucan and Protonix On folic acid which will be continued. November 17, 2014 Patient presented to emergency room with swelling and pain and redness in the left lower extremity.  With the diagnoses of cellulitis patient got vancomycin 1000 mg and was put on clindamycin over the weekend.  According to patient things did not improve came to see me.  No chills.  No fever.  Pain in the left thigh. And had Doppler study to rule out any thromboses which was negative.   REVIEW OF SYSTEMS:    general status: Patient is feeling weak and tired.  No change in a performance status.  No chills.  No fever. HEENT:.  No evidence of stomatitis Lungs: No cough or shortness of breath Cardiac: No chest pain or paroxysmal nocturnal dyspnea GI: No nausea no vomiting no diarrhea no abdominal pain Skin: No rash Left lower extremity in the thigh area or redness tenderness. Neurological system: No tingling.  No numbness.  No other focal signs Musculoskeletal system no bony pains  As per HPI. Otherwise, a complete review of systems is negatve.  PAST MEDICAL HISTORY: Past  Medical History  Diagnosis Date  . CLL (chronic lymphoblastic leukemia) 2006    Rituxan treatment 2006  . Atrial septal aneurysm 2009    Insignificant, no, January, 2009  . Infection of PEG site     cellulitis.Marland KitchenResolved  . Vertebral compression fracture     multiple levels  . Herpes zoster     right chest  . Aortic insufficiency 2009    mild, Mild, echo, 2012  . Diastolic dysfunction     mild  . Chest pain      EF 55%, echo, October, 2009, possible inferior and posterior borderline hypokinesis, patient has never  had cardiac catheterization  . Edema     EF 55%, echo, October, 2009, possible borderline inferior and posterior hypokinesis... heart catheterization has never been done(July 10, 2009)  . Myositis     Caused pulmonary insufficiency with muscle weakness in the past / Dr.Willis-neurology, Imuran and prednisone  . Calculus of kidney   . Inguinal hernia without mention of obstruction or gangrene, unilateral or unspecified, (not specified as recurrent)     right   . Unspecified gastritis and gastroduodenitis without mention of hemorrhage   . Abdominal pain, epigastric   . Nonspecific elevation of levels of transaminase or lactic acid dehydrogenase (LDH)   . Feeding difficulties and mismanagement   . Edema of male genital organs     Resolved, occurred during illness related to myositis  . Swelling of arm     left.Marland KitchenMarland KitchenResolved.... no DVT  . LFT elevation     Related to Imuran in the past... dose was adjusted  . Dyslipidemia     statins not used due to LFT abnormalities  . Shortness of breath     Breathing abnormalities related to muscle weakness from myositis  . RBBB (right bundle branch block)     Old  . Bradycardia     Sinus bradycardia, old  . Ejection fraction     EF 60%, echo, October, 2012  . Mitral regurgitation     Mild / moderate, echo, 2012  . Right ventricular dysfunction     Mild, echo, 2012  . Febrile illness     May, 2013, question sinusitis  . Polymyositis 11/26/2012    PAST SURGICAL HISTORY: Past Surgical History  Procedure Laterality Date  . Splenectomy    . Hernia repair      left side  . Peg placement  06/2007  . Peg tube removal  12/2007  . Kyphosis surgery      multilevel vertebral  . Odontoid fracture surgery      anterior screw fixation  . Nasal sinus surgery      x2  . Cataract extraction      bi lateral    FAMILY HISTORY Family History    Problem Relation Age of Onset  . Breast cancer Sister   . Colon cancer Brother   . Prostate cancer Brother   . Stroke Mother   . Hypertension Mother   . Emphysema Father   . Diabetes Grandchild   . Heart attack Neg Hx   . Cancer Brother   . Cancer Sister   . Cancer Daughter     ADVANCED DIRECTIVES:  Patient does have advance healthcare directive, Patient   does not desire to make any changes  HEALTH MAINTENANCE: History  Substance Use Topics  . Smoking status: Former Smoker -- 0.50 packs/day for 31 years    Types: Cigarettes    Quit date: 06/29/1980  . Smokeless tobacco: Never  Used  . Alcohol Use: No     Comment: rarely      Allergies  Allergen Reactions  . Sulfonamide Derivatives     Current Outpatient Prescriptions  Medication Sig Dispense Refill  . ASMANEX 60 METERED DOSES 220 MCG/INH inhaler Inhale 1 puff into the lungs 2 (two) times daily.  0  . aspirin 81 MG tablet Take 81 mg by mouth at bedtime.     . calcium-vitamin D (CALCIUM 500+D) 500-200 MG-UNIT per tablet Take 1 tablet by mouth 2 (two) times daily.      . Cholecalciferol (VITAMIN D3) 1000 UNITS tablet Take 1,000 Units by mouth daily.      . citalopram (CELEXA) 20 MG tablet Take 1 tablet (20 mg total) by mouth daily. 30 tablet 0  . clindamycin (CLEOCIN) 300 MG capsule Take 1 capsule (300 mg total) by mouth 3 (three) times daily. 30 capsule 0  . cyanocobalamin (,VITAMIN B-12,) 1000 MCG/ML injection Inject 1,000 mcg into the muscle every 14 (fourteen) days. At the first of the month    . ferrous sulfate 325 (65 FE) MG tablet Take 325 mg by mouth daily with breakfast.      . fish oil-omega-3 fatty acids 1000 MG capsule Take 1 g by mouth daily at 12 noon.     . fluconazole (DIFLUCAN) 100 MG tablet Take 1 tablet (100 mg total) by mouth daily. 30 tablet 0  . folic acid (FOLVITE) 1 MG tablet Take 4 tablets (4 mg total) by mouth daily. 120 tablet 0  . furosemide (LASIX) 40 MG tablet Take 1 tablet (40 mg total) by  mouth daily. 90 tablet 3  . HYDROcodone-acetaminophen (NORCO/VICODIN) 5-325 MG per tablet Take 1 tablet by mouth every 6 (six) hours as needed for pain.    . montelukast (SINGULAIR) 10 MG tablet Take 10 mg by mouth at bedtime.   11  . Multiple Vitamin (MULTIVITAMIN) capsule Take 1 capsule by mouth daily.      . mupirocin ointment (BACTROBAN) 2 % Apply to affected area 3 times daily 22 g 0  . mycophenolate (CELLCEPT) 500 MG tablet Take 1 tablet (500 mg total) by mouth daily. 90 tablet 3  . nystatin cream (MYCOSTATIN) Apply 1 application topically daily as needed for dry skin.   0  . pantoprazole (PROTONIX) 40 MG tablet Take 1 tablet (40 mg total) by mouth daily at 12 noon. 30 tablet 0  . potassium chloride SA (KLOR-CON M20) 20 MEQ tablet Take 1 tablet (20 mEq total) by mouth daily. Take one tablet by mouth once daily. Take an extra tablet twice a week. (Patient taking differently: Take 20-40 mEq by mouth daily. Take an extra tablet twice a week on Monday and Friday) 114 tablet 3  . predniSONE (DELTASONE) 20 MG tablet Take 1.5 tablets (30 mg total) by mouth daily with supper. 120 tablet 0  . PROAIR HFA 108 (90 BASE) MCG/ACT inhaler Inhale 2 puffs into the lungs 4 (four) times daily.   11  . Resource Beneprotein PACK Take 3g three times a day    . temazepam (RESTORIL) 30 MG capsule Take 30 mg by mouth at bedtime.      . triamcinolone cream (KENALOG) 0.1 % Apply 1 application topically daily at 12 noon.   0  . vitamin B-12 (CYANOCOBALAMIN) 1000 MCG tablet Take 1,000 mcg by mouth daily at 12 noon.      No current facility-administered medications for this visit.   Facility-Administered Medications Ordered in Other Visits  Medication Dose Route Frequency Provider Last Rate Last Dose  . vancomycin (VANCOCIN) 1,250 mg in sodium chloride 0.9 % 500 mL IVPB  1,250 mg Intravenous Once Forest Gleason, MD 250 mL/hr at 11/18/14 1505 1,250 mg at 11/18/14 1505    OBJECTIVE:  Filed Vitals:   11/17/14 1143    BP: 127/66  Pulse: 59  Temp: 95.7 F (35.4 C)     Body mass index is 21.61 kg/(m^2).    ECOG FS:1 - Symptomatic but completely ambulatory  PHYSICAL EXAM: Gen. status: Patient is alert oriented not any acute distress Lymphatic system: Supraclavicular, cervical, axillary, inguinal lymph nodes are not palpable Head exam was generally normal. There was no scleral icterus or corneal arcus. Mucous membranes were moist. Cardiac: Tachycardia Abdominal exam revealed normal bowel sounds. The abdomen was soft, non-tender, and without masses, organomegaly, or appreciable enlargement of the abdominal aorta. Examination of the skin revealed no evidence of significant rashes, suspicious appearing nevi or other concerning lesions. Neurologically, the patient was awake, alert, and oriented to person, place and time. There were no obvious focal neurologic abnormalities. Left lower extremity in the thigh area of redness in duration.  Scrotal area is also red. Kyphoscoliosis: Osteoporosis and multiple vertebral compression fracture  Skin: Grade 1 rash LAB RESULTS:  Appointment on 11/17/2014  Component Date Value Ref Range Status  . WBC 11/17/2014 17.3* 3.8 - 10.6 K/uL Final  . RBC 11/17/2014 3.07* 4.40 - 5.90 MIL/uL Final  . Hemoglobin 11/17/2014 11.4* 13.0 - 18.0 g/dL Final  . HCT 11/17/2014 33.5* 40.0 - 52.0 % Final  . MCV 11/17/2014 109.3* 80.0 - 100.0 fL Final  . MCH 11/17/2014 37.1* 26.0 - 34.0 pg Final  . MCHC 11/17/2014 34.0  32.0 - 36.0 g/dL Final  . RDW 11/17/2014 16.6* 11.5 - 14.5 % Final  . Platelets 11/17/2014 248  150 - 440 K/uL Final  . Neutrophils Relative % 11/17/2014 88   Final  . Neutro Abs 11/17/2014 15.1* 1.4 - 6.5 K/uL Final  . Lymphocytes Relative 11/17/2014 6   Final  . Lymphs Abs 11/17/2014 1.0  1.0 - 3.6 K/uL Final  . Monocytes Relative 11/17/2014 6   Final  . Monocytes Absolute 11/17/2014 1.1* 0.2 - 1.0 K/uL Final  . Eosinophils Relative 11/17/2014 0   Final  .  Eosinophils Absolute 11/17/2014 0.0  0 - 0.7 K/uL Final  . Basophils Relative 11/17/2014 0   Final  . Basophils Absolute 11/17/2014 0.0  0 - 0.1 K/uL Final  . Sodium 11/17/2014 131* 135 - 145 mmol/L Final  . Potassium 11/17/2014 4.7  3.5 - 5.1 mmol/L Final  . Chloride 11/17/2014 100* 101 - 111 mmol/L Final  . CO2 11/17/2014 28  22 - 32 mmol/L Final  . Glucose, Bld 11/17/2014 104* 65 - 99 mg/dL Final  . BUN 11/17/2014 37* 6 - 20 mg/dL Final  . Creatinine, Ser 11/17/2014 0.88  0.61 - 1.24 mg/dL Final  . Calcium 11/17/2014 8.1* 8.9 - 10.3 mg/dL Final  . Total Protein 11/17/2014 6.1* 6.5 - 8.1 g/dL Final  . Albumin 11/17/2014 3.2* 3.5 - 5.0 g/dL Final  . AST 11/17/2014 23  15 - 41 U/L Final  . ALT 11/17/2014 18  17 - 63 U/L Final  . Alkaline Phosphatase 11/17/2014 72  38 - 126 U/L Final  . Total Bilirubin 11/17/2014 1.0  0.3 - 1.2 mg/dL Final  . GFR calc non Af Amer 11/17/2014 >60  >60 mL/min Final  . GFR calc Af Amer 11/17/2014 >60  >  60 mL/min Final   Comment: (NOTE) The eGFR has been calculated using the CKD EPI equation. This calculation has not been validated in all clinical situations. eGFR's persistently <60 mL/min signify possible Chronic Kidney Disease.   . Anion gap 11/17/2014 3* 5 - 15 Final      STUDIES: US Venous Img Lower Unilateral Left  11/15/2014   CLINICAL DATA:  Left lower extremity edema and erythema for 4 days  EXAM: LEFT LOWER EXTREMITY VENOUS DOPPLER ULTRASOUND  TECHNIQUE: Gray-scale sonography with graded compression, as well as color Doppler and duplex ultrasound were performed to evaluate the lower extremity deep venous systems from the level of the common femoral vein and including the common femoral, femoral, profunda femoral, popliteal and calf veins including the posterior tibial, peroneal and gastrocnemius veins when visible. The superficial great saphenous vein was also interrogated. Spectral Doppler was utilized to evaluate flow at rest and with distal  augmentation maneuvers in the common femoral, femoral and popliteal veins.  COMPARISON:  None.  FINDINGS: Contralateral Common Femoral Vein: Respiratory phasicity is normal and symmetric with the symptomatic side. No evidence of thrombus. Normal compressibility.  Common Femoral Vein: No evidence of thrombus. Normal compressibility, respiratory phasicity and response to augmentation.  Saphenofemoral Junction: No evidence of thrombus. Normal compressibility and flow on color Doppler imaging.  Profunda Femoral Vein: No evidence of thrombus. Normal compressibility and flow on color Doppler imaging.  Femoral Vein: No evidence of thrombus. Normal compressibility, respiratory phasicity and response to augmentation.  Popliteal Vein: No evidence of thrombus. Normal compressibility, respiratory phasicity and response to augmentation.  Calf Veins: No evidence of thrombus. Normal compressibility and flow on color Doppler imaging.  Superficial Great Saphenous Vein: No evidence of thrombus. Normal compressibility and flow on color Doppler imaging.  Venous Reflux:  None.  Other Findings: Multiple prominent left inguinal lymph nodes the largest measuring 21 mm in greatest diameter. These demonstrate fatty hila with no significant cortical thickening and are presumably reactive.  IMPRESSION: No evidence of deep venous thrombosis.   Electronically Signed   By: Skipper Cliche M.D.   On: 11/15/2014 12:34    ASSESSMENT: Autoimmune hemolytic anemia with lymphoproliferative disease Patient is immunocompromised because of prednisone as well as rituximab therapy There is cellulitis of the left thigh area.  Without any fever.  Leukocytosis. BLOOD Cultures are obtained in the hospital  ARE  negative  Ultrasound of the left lower extremity was without any thrombosis MEDICAL DECISION MAKING:    All lab data has been reviewed.  Patient was hostile be hospitalized but he wanted to try intravenous antibiotic as outpatient. Discussed  situation with pharmacist dose of 1500 g of vancomycin was given Clindamycin was discontinued Patient will be reevaluated tomorrow.   No matching staging information was found for the patient.  Forest Gleason, MD   11/18/2014 4:04 PM

## 2014-11-18 NOTE — Telephone Encounter (Signed)
Pharmacy contacted to dose vancomycin in this 81yo M being treated for cellulitis. Patient was treated with vancomycin 1gm IV once on 8/6, and then started on clindamycin for outpatient treatment. He presented to the ED on 8/8 with apparent worsening of cellulitis and decision made to treat with vancomycin for possible MRSA infection unresponsive to clindamycin.   Vancomycin 1500mg  IV once loading dose given on 8/8  Recommend vancomycin 1250mg  IV Q24hrs as maintenance dose if antibiotic continuation is needed. Recommend vancomycin trough obtained on 8/11 before dose given to determine if current regimen is appropriate. Goal trough is 15-20 for possible MRSA. No peak level is needed.

## 2014-11-18 NOTE — Progress Notes (Signed)
Patient does have living will.  Former smoker.  Patient states leg is better today.

## 2014-11-19 ENCOUNTER — Telehealth: Payer: Self-pay | Admitting: *Deleted

## 2014-11-19 ENCOUNTER — Inpatient Hospital Stay (HOSPITAL_BASED_OUTPATIENT_CLINIC_OR_DEPARTMENT_OTHER): Payer: Medicare Other | Admitting: Oncology

## 2014-11-19 ENCOUNTER — Inpatient Hospital Stay: Payer: Medicare Other

## 2014-11-19 ENCOUNTER — Ambulatory Visit (INDEPENDENT_AMBULATORY_CARE_PROVIDER_SITE_OTHER): Payer: Medicare Other | Admitting: General Surgery

## 2014-11-19 ENCOUNTER — Encounter: Payer: Self-pay | Admitting: General Surgery

## 2014-11-19 ENCOUNTER — Inpatient Hospital Stay
Admission: AD | Admit: 2014-11-19 | Discharge: 2014-11-28 | DRG: 603 | Disposition: A | Discharge status: Home / Self Care | Payer: Medicare Other | Source: Ambulatory Visit | Attending: Oncology | Admitting: Oncology

## 2014-11-19 VITALS — BP 113/67 | HR 68 | Temp 96.7°F | Wt 133.0 lb

## 2014-11-19 VITALS — BP 110/62 | HR 66 | Resp 14 | Ht 68.0 in | Wt 133.0 lb

## 2014-11-19 DIAGNOSIS — L03116 Cellulitis of left lower limb: Secondary | ICD-10-CM

## 2014-11-19 DIAGNOSIS — Z8042 Family history of malignant neoplasm of prostate: Secondary | ICD-10-CM | POA: Diagnosis not present

## 2014-11-19 DIAGNOSIS — C9110 Chronic lymphocytic leukemia of B-cell type not having achieved remission: Secondary | ICD-10-CM | POA: Diagnosis present

## 2014-11-19 DIAGNOSIS — Z79899 Other long term (current) drug therapy: Secondary | ICD-10-CM

## 2014-11-19 DIAGNOSIS — R21 Rash and other nonspecific skin eruption: Secondary | ICD-10-CM | POA: Diagnosis not present

## 2014-11-19 DIAGNOSIS — Z803 Family history of malignant neoplasm of breast: Secondary | ICD-10-CM | POA: Diagnosis not present

## 2014-11-19 DIAGNOSIS — Z823 Family history of stroke: Secondary | ICD-10-CM | POA: Diagnosis not present

## 2014-11-19 DIAGNOSIS — E785 Hyperlipidemia, unspecified: Secondary | ICD-10-CM | POA: Diagnosis not present

## 2014-11-19 DIAGNOSIS — Z809 Family history of malignant neoplasm, unspecified: Secondary | ICD-10-CM

## 2014-11-19 DIAGNOSIS — D479 Neoplasm of uncertain behavior of lymphoid, hematopoietic and related tissue, unspecified: Secondary | ICD-10-CM

## 2014-11-19 DIAGNOSIS — Z8249 Family history of ischemic heart disease and other diseases of the circulatory system: Secondary | ICD-10-CM

## 2014-11-19 DIAGNOSIS — I4510 Unspecified right bundle-branch block: Secondary | ICD-10-CM | POA: Diagnosis not present

## 2014-11-19 DIAGNOSIS — L0390 Cellulitis, unspecified: Secondary | ICD-10-CM | POA: Diagnosis present

## 2014-11-19 DIAGNOSIS — I2510 Atherosclerotic heart disease of native coronary artery without angina pectoris: Secondary | ICD-10-CM | POA: Diagnosis not present

## 2014-11-19 DIAGNOSIS — R5383 Other fatigue: Secondary | ICD-10-CM | POA: Diagnosis not present

## 2014-11-19 DIAGNOSIS — D899 Disorder involving the immune mechanism, unspecified: Secondary | ICD-10-CM | POA: Diagnosis present

## 2014-11-19 DIAGNOSIS — R000 Tachycardia, unspecified: Secondary | ICD-10-CM | POA: Diagnosis not present

## 2014-11-19 DIAGNOSIS — I340 Nonrheumatic mitral (valve) insufficiency: Secondary | ICD-10-CM

## 2014-11-19 DIAGNOSIS — I351 Nonrheumatic aortic (valve) insufficiency: Secondary | ICD-10-CM | POA: Diagnosis not present

## 2014-11-19 DIAGNOSIS — R0602 Shortness of breath: Secondary | ICD-10-CM | POA: Diagnosis not present

## 2014-11-19 DIAGNOSIS — Z9081 Acquired absence of spleen: Secondary | ICD-10-CM | POA: Diagnosis present

## 2014-11-19 DIAGNOSIS — Z825 Family history of asthma and other chronic lower respiratory diseases: Secondary | ICD-10-CM | POA: Diagnosis not present

## 2014-11-19 DIAGNOSIS — Z8711 Personal history of peptic ulcer disease: Secondary | ICD-10-CM

## 2014-11-19 DIAGNOSIS — I771 Stricture of artery: Secondary | ICD-10-CM

## 2014-11-19 DIAGNOSIS — E538 Deficiency of other specified B group vitamins: Secondary | ICD-10-CM

## 2014-11-19 DIAGNOSIS — L0291 Cutaneous abscess, unspecified: Secondary | ICD-10-CM

## 2014-11-19 DIAGNOSIS — Z7982 Long term (current) use of aspirin: Secondary | ICD-10-CM | POA: Diagnosis not present

## 2014-11-19 DIAGNOSIS — R609 Edema, unspecified: Secondary | ICD-10-CM

## 2014-11-19 DIAGNOSIS — D591 Other autoimmune hemolytic anemias: Secondary | ICD-10-CM

## 2014-11-19 DIAGNOSIS — Z7952 Long term (current) use of systemic steroids: Secondary | ICD-10-CM | POA: Diagnosis not present

## 2014-11-19 DIAGNOSIS — D72829 Elevated white blood cell count, unspecified: Secondary | ICD-10-CM | POA: Diagnosis not present

## 2014-11-19 DIAGNOSIS — Z87891 Personal history of nicotine dependence: Secondary | ICD-10-CM | POA: Diagnosis not present

## 2014-11-19 DIAGNOSIS — K4090 Unilateral inguinal hernia, without obstruction or gangrene, not specified as recurrent: Secondary | ICD-10-CM

## 2014-11-19 DIAGNOSIS — Z800 Family history of malignant neoplasm of digestive organs: Secondary | ICD-10-CM | POA: Diagnosis not present

## 2014-11-19 DIAGNOSIS — M818 Other osteoporosis without current pathological fracture: Secondary | ICD-10-CM

## 2014-11-19 DIAGNOSIS — L03314 Cellulitis of groin: Secondary | ICD-10-CM | POA: Diagnosis present

## 2014-11-19 DIAGNOSIS — Z8673 Personal history of transient ischemic attack (TIA), and cerebral infarction without residual deficits: Secondary | ICD-10-CM

## 2014-11-19 DIAGNOSIS — I253 Aneurysm of heart: Secondary | ICD-10-CM | POA: Diagnosis not present

## 2014-11-19 DIAGNOSIS — R531 Weakness: Secondary | ICD-10-CM | POA: Diagnosis not present

## 2014-11-19 DIAGNOSIS — R740 Nonspecific elevation of levels of transaminase and lactic acid dehydrogenase [LDH]: Secondary | ICD-10-CM

## 2014-11-19 DIAGNOSIS — Z8781 Personal history of (healed) traumatic fracture: Secondary | ICD-10-CM

## 2014-11-19 DIAGNOSIS — D594 Other nonautoimmune hemolytic anemias: Secondary | ICD-10-CM

## 2014-11-19 DIAGNOSIS — Z5111 Encounter for antineoplastic chemotherapy: Secondary | ICD-10-CM | POA: Diagnosis not present

## 2014-11-19 DIAGNOSIS — Z87442 Personal history of urinary calculi: Secondary | ICD-10-CM | POA: Diagnosis not present

## 2014-11-19 LAB — BASIC METABOLIC PANEL
ANION GAP: 6 (ref 5–15)
BUN: 24 mg/dL — ABNORMAL HIGH (ref 6–20)
CALCIUM: 8 mg/dL — AB (ref 8.9–10.3)
CO2: 26 mmol/L (ref 22–32)
Chloride: 103 mmol/L (ref 101–111)
Creatinine, Ser: 0.72 mg/dL (ref 0.61–1.24)
GFR calc Af Amer: >60 mL/min (ref >60)
GLUCOSE: 91 mg/dL (ref 65–99)
Potassium: 4.7 mmol/L (ref 3.5–5.1)
Sodium: 135 mmol/L (ref 135–145)

## 2014-11-19 MED ORDER — PANTOPRAZOLE SODIUM 40 MG PO TBEC
40.0000 mg | DELAYED_RELEASE_TABLET | Freq: Every day | ORAL | Status: DC
  Administered 2014-11-20 – 2014-11-28 (×8): 40 mg via ORAL
  Filled 2014-11-19 (×8): qty 1

## 2014-11-19 MED ORDER — VANCOMYCIN HCL 500 MG IV SOLR
1250.0000 mg | Freq: Once | INTRAVENOUS | Status: AC
  Administered 2014-11-19: 1250 mg via INTRAVENOUS
  Filled 2014-11-19: qty 1250

## 2014-11-19 MED ORDER — HYDROCODONE-ACETAMINOPHEN 5-325 MG PO TABS
1.0000 | ORAL_TABLET | Freq: Four times a day (QID) | ORAL | Status: DC | PRN
  Administered 2014-11-19 – 2014-11-28 (×19): 1 via ORAL
  Filled 2014-11-19 (×20): qty 1

## 2014-11-19 MED ORDER — FERROUS SULFATE 325 (65 FE) MG PO TABS
325.0000 mg | ORAL_TABLET | Freq: Every day | ORAL | Status: DC
  Administered 2014-11-20 – 2014-11-28 (×9): 325 mg via ORAL
  Filled 2014-11-19 (×9): qty 1

## 2014-11-19 MED ORDER — IOHEXOL 300 MG/ML  SOLN
100.0000 mL | Freq: Once | INTRAMUSCULAR | Status: AC | PRN
  Administered 2014-11-19: 100 mL via INTRAVENOUS

## 2014-11-19 MED ORDER — MONTELUKAST SODIUM 10 MG PO TABS
10.0000 mg | ORAL_TABLET | Freq: Every day | ORAL | Status: DC
  Administered 2014-11-19 – 2014-11-27 (×9): 10 mg via ORAL
  Filled 2014-11-19 (×9): qty 1

## 2014-11-19 MED ORDER — PREDNISONE 10 MG PO TABS
10.0000 mg | ORAL_TABLET | Freq: Every day | ORAL | Status: DC
  Administered 2014-11-20 – 2014-11-28 (×9): 10 mg via ORAL
  Filled 2014-11-19 (×9): qty 1

## 2014-11-19 MED ORDER — VANCOMYCIN HCL 10 G IV SOLR: 1250.0000 mg | INTRAVENOUS | Status: DC

## 2014-11-19 MED ORDER — CLINDAMYCIN PHOSPHATE 900 MG/50ML IV SOLN
900.0000 mg | Freq: Three times a day (TID) | INTRAVENOUS | Status: DC
  Administered 2014-11-20 – 2014-11-24 (×15): 900 mg via INTRAVENOUS
  Filled 2014-11-19 (×18): qty 50

## 2014-11-19 MED ORDER — TEMAZEPAM 15 MG PO CAPS
30.0000 mg | ORAL_CAPSULE | Freq: Every day | ORAL | Status: DC
  Administered 2014-11-19 – 2014-11-27 (×9): 30 mg via ORAL
  Filled 2014-11-19 (×9): qty 2

## 2014-11-19 MED ORDER — CALCIUM CARBONATE-VITAMIN D 500-200 MG-UNIT PO TABS
1.0000 | ORAL_TABLET | Freq: Two times a day (BID) | ORAL | Status: DC
  Administered 2014-11-19 – 2014-11-28 (×18): 1 via ORAL
  Filled 2014-11-19 (×18): qty 1

## 2014-11-19 MED ORDER — BUDESONIDE 0.5 MG/2ML IN SUSP
0.5000 mg | Freq: Two times a day (BID) | RESPIRATORY_TRACT | Status: DC
  Administered 2014-11-19 – 2014-11-24 (×9): 0.5 mg via RESPIRATORY_TRACT
  Filled 2014-11-19 (×11): qty 2

## 2014-11-19 MED ORDER — VANCOMYCIN HCL IN DEXTROSE 750-5 MG/150ML-% IV SOLN
750.0000 mg | Freq: Two times a day (BID) | INTRAVENOUS | Status: DC
  Administered 2014-11-20 – 2014-11-21 (×4): 750 mg via INTRAVENOUS
  Filled 2014-11-19 (×5): qty 150

## 2014-11-19 MED ORDER — ASPIRIN 81 MG PO CHEW
81.0000 mg | CHEWABLE_TABLET | Freq: Every day | ORAL | Status: DC
  Administered 2014-11-20 – 2014-11-28 (×4): 81 mg via ORAL
  Filled 2014-11-19 (×8): qty 1

## 2014-11-19 MED ORDER — CITALOPRAM HYDROBROMIDE 20 MG PO TABS
20.0000 mg | ORAL_TABLET | Freq: Every day | ORAL | Status: DC
  Administered 2014-11-19 – 2014-11-28 (×10): 20 mg via ORAL
  Filled 2014-11-19 (×10): qty 1

## 2014-11-19 MED ORDER — PIPERACILLIN-TAZOBACTAM 3.375 G IVPB
3.3750 g | Freq: Three times a day (TID) | INTRAVENOUS | Status: DC
  Administered 2014-11-19 – 2014-11-26 (×21): 3.375 g via INTRAVENOUS
  Filled 2014-11-19 (×25): qty 50

## 2014-11-19 MED ORDER — ALBUTEROL SULFATE (2.5 MG/3ML) 0.083% IN NEBU
3.0000 mL | INHALATION_SOLUTION | Freq: Four times a day (QID) | RESPIRATORY_TRACT | Status: DC
  Administered 2014-11-19 – 2014-11-24 (×17): 3 mL via RESPIRATORY_TRACT
  Filled 2014-11-19 (×18): qty 3

## 2014-11-19 MED ORDER — ACETAMINOPHEN 325 MG PO TABS
650.0000 mg | ORAL_TABLET | ORAL | Status: DC | PRN
  Administered 2014-11-22 – 2014-11-24 (×4): 650 mg via ORAL
  Filled 2014-11-19 (×4): qty 2

## 2014-11-19 MED ORDER — SODIUM CHLORIDE 0.9 % IV SOLN
INTRAVENOUS | Status: DC
  Administered 2014-11-19 – 2014-11-25 (×7): via INTRAVENOUS

## 2014-11-19 NOTE — Progress Notes (Signed)
Patient does living will.  Former smoker.  Patient acute add on for cellulitis - 3rd day.  Seen by Dr. Jamal Collin this am who recommends imaging, direct admit and going to OR for wound drainage. Patient experiencing pain 4/10.  Left leg edematous down to ankle.

## 2014-11-19 NOTE — Plan of Care (Signed)
Problem: Discharge Progression Outcomes Goal: Discharge plan in place and appropriate [facility] Cellulitis [facility]in

## 2014-11-19 NOTE — Progress Notes (Signed)
[name], male   DOB: May 31, 1929, 81 y.o.   MRN: [id number]  Chief Complaint  Patient presents with  . Other    lower leg abscess    HPI [name] [name] is a 81 y.o. male here todat for a evaluation of a upper left leg abscess. Patient states he noticed this five days ago. The left leg is red and swollen, no drainage. Problem is worsening.  No history of any injury or bites. Problem started in left upper thigh in front. He has received Vancomycin as opt, had a Duplex study that showed no clots in deep veins. HPI  Past Medical History  Diagnosis Date  . CLL (chronic lymphoblastic leukemia) 2006    Rituxan treatment 2006  . Atrial septal aneurysm 2009    Insignificant, no, January, 2009  . Infection of PEG site     cellulitis.Marland KitchenResolved  . Vertebral compression fracture     multiple levels  . Herpes zoster     right chest  . Aortic insufficiency 2009    mild, Mild, echo, 2012  . Diastolic dysfunction     mild  . Chest pain     EF 55%, echo, October, 2009, possible inferior and posterior borderline hypokinesis, patient has never  had cardiac catheterization  . Edema     EF 55%, echo, October, 2009, possible borderline inferior and posterior hypokinesis... heart catheterization has never been done(July 10, 2009)  . Myositis     Caused pulmonary insufficiency with muscle weakness in the past / Dr.Willis-neurology, Imuran and prednisone  . Calculus of kidney   . Inguinal hernia without mention of obstruction or gangrene, unilateral or unspecified, (not specified as recurrent)     right   . Unspecified gastritis and gastroduodenitis without mention of hemorrhage   . Abdominal pain, epigastric   . Nonspecific elevation of levels of transaminase or lactic acid dehydrogenase (LDH)   . Feeding difficulties and mismanagement   . Edema of male genital organs     Resolved, occurred during illness related to myositis  . Swelling of arm     left.Marland KitchenMarland KitchenResolved.... no DVT  . LFT  elevation     Related to Imuran in the past... dose was adjusted  . Dyslipidemia     statins not used due to LFT abnormalities  . Shortness of breath     Breathing abnormalities related to muscle weakness from myositis  . RBBB (right bundle branch block)     Old  . Bradycardia     Sinus bradycardia, old  . Ejection fraction     EF 60%, echo, October, 2012  . Mitral regurgitation     Mild / moderate, echo, 2012  . Right ventricular dysfunction     Mild, echo, 2012  . Febrile illness     May, 2013, question sinusitis  . Polymyositis 11/26/2012    Past Surgical History  Procedure Laterality Date  . Splenectomy    . Hernia repair      left side  . Peg placement  06/2007  . Peg tube removal  12/2007  . Kyphosis surgery      multilevel vertebral  . Odontoid fracture surgery      anterior screw fixation  . Nasal sinus surgery      x2  . Cataract extraction      bi lateral    Family History  Problem Relation Age of Onset  . Breast cancer Sister   . Colon cancer Brother   . Prostate cancer  Brother   . Stroke Mother   . Hypertension Mother   . Emphysema Father   . Diabetes Grandchild   . Heart attack Neg Hx   . Cancer Brother   . Cancer Sister   . Cancer Daughter     Social History Social History  Substance Use Topics  . Smoking status: Former Smoker -- 0.50 packs/day for 31 years    Types: Cigarettes    Quit date: 06/29/1980  . Smokeless tobacco: Never Used  . Alcohol Use: No     Comment: rarely    Allergies  Allergen Reactions  . Sulfonamide Derivatives     Current Outpatient Prescriptions  Medication Sig Dispense Refill  . ASMANEX 60 METERED DOSES 220 MCG/INH inhaler Inhale 1 puff into the lungs 2 (two) times daily.  0  . aspirin 81 MG tablet Take 81 mg by mouth at bedtime.     . calcium-vitamin D (CALCIUM 500+D) 500-200 MG-UNIT per tablet Take 1 tablet by mouth 2 (two) times daily.      . Cholecalciferol (VITAMIN D3) 1000 UNITS tablet Take 1,000 Units  by mouth daily.      . citalopram (CELEXA) 20 MG tablet Take 1 tablet (20 mg total) by mouth daily. 30 tablet 0  . clindamycin (CLEOCIN) 300 MG capsule Take 1 capsule (300 mg total) by mouth 3 (three) times daily. 30 capsule 0  . cyanocobalamin (,VITAMIN B-12,) 1000 MCG/ML injection Inject 1,000 mcg into the muscle every 14 (fourteen) days. At the first of the month    . ferrous sulfate 325 (65 FE) MG tablet Take 325 mg by mouth daily with breakfast.      . fish oil-omega-3 fatty acids 1000 MG capsule Take 1 g by mouth daily at 12 noon.     . fluconazole (DIFLUCAN) 100 MG tablet Take 1 tablet (100 mg total) by mouth daily. 30 tablet 0  . folic acid (FOLVITE) 1 MG tablet Take 4 tablets (4 mg total) by mouth daily. 120 tablet 0  . furosemide (LASIX) 40 MG tablet Take 1 tablet (40 mg total) by mouth daily. 90 tablet 3  . HYDROcodone-acetaminophen (NORCO/VICODIN) 5-325 MG per tablet Take 1 tablet by mouth every 6 (six) hours as needed for pain.    . montelukast (SINGULAIR) 10 MG tablet Take 10 mg by mouth at bedtime.   11  . Multiple Vitamin (MULTIVITAMIN) capsule Take 1 capsule by mouth daily.      . mupirocin ointment (BACTROBAN) 2 % Apply to affected area 3 times daily 22 g 0  . mycophenolate (CELLCEPT) 500 MG tablet Take 1 tablet (500 mg total) by mouth daily. 90 tablet 3  . nystatin cream (MYCOSTATIN) Apply 1 application topically daily as needed for dry skin.   0  . pantoprazole (PROTONIX) 40 MG tablet Take 1 tablet (40 mg total) by mouth daily at 12 noon. 30 tablet 0  . potassium chloride SA (KLOR-CON M20) 20 MEQ tablet Take 1 tablet (20 mEq total) by mouth daily. Take one tablet by mouth once daily. Take an extra tablet twice a week. (Patient taking differently: Take 20-40 mEq by mouth daily. Take an extra tablet twice a week on Monday and Friday) 114 tablet 3  . predniSONE (DELTASONE) 20 MG tablet Take 1.5 tablets (30 mg total) by mouth daily with supper. 120 tablet 0  . PROAIR HFA 108 (90  BASE) MCG/ACT inhaler Inhale 2 puffs into the lungs 4 (four) times daily.   11  . Resource Beneprotein  PACK Take 3g three times a day    . temazepam (RESTORIL) 30 MG capsule Take 30 mg by mouth at bedtime.      . triamcinolone cream (KENALOG) 0.1 % Apply 1 application topically daily at 12 noon.   0  . vitamin B-12 (CYANOCOBALAMIN) 1000 MCG tablet Take 1,000 mcg by mouth daily at 12 noon.      No current facility-administered medications for this visit.   Facility-Administered Medications Ordered in Other Visits  Medication Dose Route Frequency Provider Last Rate Last Dose  . vancomycin (VANCOCIN) 1,250 mg in sodium chloride 0.9 % 500 mL IVPB  1,250 mg Intravenous Once Forest Gleason, MD        Review of Systems Review of Systems  Constitutional: Negative.   Respiratory: Negative.   Cardiovascular: Negative.     Blood pressure 110/62, pulse 66, resp. rate 14, height 5\' 8"  (1.727 m), weight 133 lb (60.328 kg).  Physical Exam Physical Exam  Constitutional: He appears well-developed and well-nourished.  Eyes: Conjunctivae are normal. No scleral icterus.  Neck: Neck supple.  Cardiovascular: Normal rate and normal heart sounds.   Pulmonary/Chest: Effort normal and breath sounds normal.  Abdominal: Soft. Normal appearance and bowel sounds are normal. There is no tenderness. A hernia (right lateral inguinal area-recurrent) is present.  Lymphadenopathy:    He has no cervical adenopathy.  Skin:       Data Reviewed Oncology notes  Assessment    Pt with known CLL, prior splenectomy for thrombocytopenia. He has a large area of cellulitis, possible abscess left thigh, worsening in spite of antibiotic for last 3-4 days. Discussed with Dr. Oliva Bustard. He is to admit pt to [facility] today. Requested imaging-CT to decide if he needs drainage and debridement    Plan    As above. Pt advised fully.       PCP:  Charolotte Eke G 11/19/2014, 1:07 PM

## 2014-11-19 NOTE — Progress Notes (Signed)
ANTIBIOTIC CONSULT NOTE - INITIAL  Pharmacy Consult for Vancomycin and Zosyn  Indication: upper left leg abscess.   Allergies  Allergen Reactions  . Sulfonamide Derivatives     Patient Measurements:   Adjusted Body Weight:   Vital Signs: Temp: 96.7 F (35.9 C) (08/10 1148) Temp Source: Tympanic (08/10 1148) BP: 113/67 mmHg (08/10 1148) Pulse Rate: 68 (08/10 1148) Intake/Output from previous day:   Intake/Output from this shift:    Labs:  Recent Labs  11/17/14 1116  WBC 17.3*  HGB 11.4*  PLT 248  CREATININE 0.88   Estimated Creatinine Clearance: 55.2 mL/min (by C-G formula based on Cr of 0.88). No results for input(s): VANCOTROUGH, VANCOPEAK, VANCORANDOM, GENTTROUGH, GENTPEAK, GENTRANDOM, TOBRATROUGH, TOBRAPEAK, TOBRARND, AMIKACINPEAK, AMIKACINTROU, AMIKACIN in the last 72 hours.   Microbiology: Recent Results (from the past 720 hour(s))  Blood culture (routine x 2)     Status: None (Preliminary result)   Collection Time: 11/15/14 10:34 AM  Result Value Ref Range Status   Specimen Description BLOOD RIGHT ASSIST CONTROL  Final   Special Requests BOTTLES DRAWN AEROBIC AND ANAEROBIC  1CC  Final   Culture NO GROWTH 4 DAYS  Final   Report Status PENDING  Incomplete  Blood culture (routine x 2)     Status: None (Preliminary result)   Collection Time: 11/15/14 10:41 AM  Result Value Ref Range Status   Specimen Description BLOOD LEFT ASSIST CONTROL  Final   Special Requests BOTTLES DRAWN AEROBIC AND ANAEROBIC  4CC  Final   Culture NO GROWTH 4 DAYS  Final   Report Status PENDING  Incomplete    Medical History: Past Medical History  Diagnosis Date  . CLL (chronic lymphoblastic leukemia) 2006    Rituxan treatment 2006  . Atrial septal aneurysm 2009    Insignificant, no, January, 2009  . Infection of PEG site     cellulitis.Marland KitchenResolved  . Vertebral compression fracture     multiple levels  . Herpes zoster     right chest  . Aortic insufficiency 2009    mild,  Mild, echo, 2012  . Diastolic dysfunction     mild  . Chest pain     EF 55%, echo, October, 2009, possible inferior and posterior borderline hypokinesis, patient has never  had cardiac catheterization  . Edema     EF 55%, echo, October, 2009, possible borderline inferior and posterior hypokinesis... heart catheterization has never been done(July 10, 2009)  . Myositis     Caused pulmonary insufficiency with muscle weakness in the past / Dr.Willis-neurology, Imuran and prednisone  . Calculus of kidney   . Inguinal hernia without mention of obstruction or gangrene, unilateral or unspecified, (not specified as recurrent)     right   . Unspecified gastritis and gastroduodenitis without mention of hemorrhage   . Abdominal pain, epigastric   . Nonspecific elevation of levels of transaminase or lactic acid dehydrogenase (LDH)   . Feeding difficulties and mismanagement   . Edema of male genital organs     Resolved, occurred during illness related to myositis  . Swelling of arm     left.Marland KitchenMarland KitchenResolved.... no DVT  . LFT elevation     Related to Imuran in the past... dose was adjusted  . Dyslipidemia     statins not used due to LFT abnormalities  . Shortness of breath     Breathing abnormalities related to muscle weakness from myositis  . RBBB (right bundle branch block)     Old  . Bradycardia  Sinus bradycardia, old  . Ejection fraction     EF 60%, echo, October, 2012  . Mitral regurgitation     Mild / moderate, echo, 2012  . Right ventricular dysfunction     Mild, echo, 2012  . Febrile illness     May, 2013, question sinusitis  . Polymyositis 11/26/2012    Medications:  Scheduled:  . piperacillin-tazobactam (ZOSYN)  IV  3.375 g Intravenous 3 times per day  . [START ON 11/20/2014] predniSONE  10 mg Oral Q breakfast  . [START ON 11/20/2014] vancomycin  1,250 mg Intravenous Q24H  . [START ON 11/20/2014] vancomycin  750 mg Intravenous Q12H   Assessment:  8/8: [facility] contacted to  dose vancomycin in this 81yo M being treated for cellulitis. Patient was treated with vancomycin 1gm IV once on 8/6, and then started on clindamycin for outpatient treatment. He presented to the ED on 8/8 with apparent worsening of cellulitis and decision made to treat with vancomycin for possible MRSA infection unresponsive to clindamycin.   Vancomycin 1500mg  IV once loading dose given on 8/8  Patient receive vancomycin 1250 mg IV on 8/10 @ ~1330.   Problem started in left upper thigh in front. He has received Vancomycin as opt, had a Duplex study that showed no clots in deep veins.  Will dose based on PK parameters Kel (hr-1): 0.055 Half-life (hrs): 12.60 Vd (liters): 42.21 (factor used: 0.7 L/kg)  Goal of Therapy:  Vancomycin trough level 10-15 mcg/ml Vancomycin trough level 15-20 mcg/ml  Plan:   Will start Vancomycin 750 mg IV q12 hours @ 04:00 on 8/11.  Will order Vancomycin trough level prior to the 16:00 dose on 8/12.    Follow up culture results  [name] [name] 11/19/2014,4:30 PM

## 2014-11-19 NOTE — H&P (Signed)
Expand All Collapse All                       Expand All Collapse All    Cancer [facility] @ [facility] [phone] [phone]     [name] [name] OB: [birth year] MR#: [id number] MKL#:[id number]  Patient Care Team: Juline Patch, MD as PCP - General (Family Medicine) Kathrynn Ducking, MD (Neurology) Forest Gleason, MD as Consulting Physician (Unknown Physician Specialty) Carlena Bjornstad, MD as Consulting Physician (Cardiology) Seeplaputhur Robinette Haines, MD (General Surgery)  CHIEF COMPLAINT:  Cellulitis of lower extremity not responding to outpatient intravenous therapy  Oncology History   Chief Complaint/Problem List  1. Autoimmune hemolytic anemia. Previously received Prednisone taper.  2. Splenectomy in 5/02.  3. Gastric ulcer diagnosed on endoscopy and multiple biopsies are pending, November 2005. 4. Chronic lymphocytic leukemia. 5.vitamin B 12 deficiency 6. Recent admission in the hospital in June of 2016 with hemolytic anemia and hemoglobin of 5.5 patient received blood transfusion was started on steroid 7. Patient was started on rituximab (October 20, 2014     Chronic lymphocytic leukemia   08/01/2008 Initial Diagnosis Chronic lymphocytic leukemia    Oncology Flowsheet 10/11/2014 10/12/2014 10/13/2014 10/20/2014 10/27/2014 11/03/2014 11/10/2014  cyanocobalamin ((VITAMIN B-12)) IM  - - - 1,000 mcg - 1,000 mcg  methylPREDNISolone sodium succinate 40 mg/mL (SOLU-MEDROL) IV - - - - - - -  predniSONE (DELTASONE) PO 80 mg 80 mg 80 mg - - - -  riTUXimab (RITUXAN) IV - - - 375 mg/m2 375 mg/m2 375 mg/m2 375 mg/m2  zolendronic acid (ZOMETA) IV  - - - - - -    INTERVAL HISTORY:  81 year old gentleman was recently admitted in the hospital with increasing shortness of breath. Hemoglobin was 5.5. Patient received blood transfusion patient has autoimmune hemolytic anemia secondary to call chronic  lymphocytic leukemia. Had received Rituxan in the past. Patient was started on prednisone 80 mg daily. Hemoglobin improved to 10.5 patient is here for ongoing evaluation and treatment consideration November 03, 2014 Patient is here for ongoing evaluation taking prednisone 40 mg daily which has been now decreased 30 mg by mouth daily hemoglobin is improving. Tolerating Rituxan with minimal rash and no other side effect. November 10, 2014 Patient is here for his last chemotherapy cycle. No chills. No fever. Hemoglobin is improved. No rash. No diarrhea. Patient had skin infection in the left axillary area Also had number of questions regarding discontinuing Diflucan and Protonix On folic acid which will be continued.  November 18, 2014 Patient is here for further follow-up regarding cellulitis Received a single dose of vancomycin yesterday Redness all around the thigh has improved but patient has more pain and feeling somewhat weak and tired.  November 19, 2014 Patient was admitted in the hospital because of poor response to vancomycin. Left thigh area is very painful. More redness. No chills. No fever. Patient was evaluated by Dr. Jamal Collin or as outpatient and suggested further evaluation with CT scan also getting infectious disease consult for broadening antibiotic coverage. Patient is feeling weak and tired. REVIEW OF SYSTEMS:  general status: Patient is feeling weak and tired. No change in a performance status. No chills. No fever. HEENT:. No evidence of stomatitis Lungs: No cough or shortness of breath Cardiac: No chest pain or paroxysmal nocturnal dyspnea GI: No nausea no vomiting no diarrhea no abdominal pain Skin: No rash Lower extremity redness and tenderness Neurological system: No tingling. No numbness. No other focal  signs Musculoskeletal system no bony pains  As per HPI. Otherwise, a complete review of systems is negatve.  PAST MEDICAL HISTORY: Past Medical History    Diagnosis Date  . CLL (chronic lymphoblastic leukemia) 2006    Rituxan treatment 2006  . Atrial septal aneurysm 2009    Insignificant, no, January, 2009  . Infection of PEG site     cellulitis.Marland KitchenResolved  . Vertebral compression fracture     multiple levels  . Herpes zoster     right chest  . Aortic insufficiency 2009    mild, Mild, echo, 2012  . Diastolic dysfunction     mild  . Chest pain     EF 55%, echo, October, 2009, possible inferior and posterior borderline hypokinesis, patient has never had cardiac catheterization  . Edema     EF 55%, echo, October, 2009, possible borderline inferior and posterior hypokinesis... heart catheterization has never been done(July 10, 2009)  . Myositis     Caused pulmonary insufficiency with muscle weakness in the past / Dr.Willis-neurology, Imuran and prednisone  . Calculus of kidney   . Inguinal hernia without mention of obstruction or gangrene, unilateral or unspecified, (not specified as recurrent)     right   . Unspecified gastritis and gastroduodenitis without mention of hemorrhage   . Abdominal pain, epigastric   . Nonspecific elevation of levels of transaminase or lactic acid dehydrogenase (LDH)   . Feeding difficulties and mismanagement   . Edema of male genital organs     Resolved, occurred during illness related to myositis  . Swelling of arm     left.Marland KitchenMarland KitchenResolved.... no DVT  . LFT elevation     Related to Imuran in the past... dose was adjusted  . Dyslipidemia     statins not used due to LFT abnormalities  . Shortness of breath     Breathing abnormalities related to muscle weakness from myositis  . RBBB (right bundle branch block)     Old  . Bradycardia     Sinus bradycardia, old  . Ejection fraction     EF 60%, echo, October, 2012  . Mitral regurgitation     Mild / moderate, echo, 2012   . Right ventricular dysfunction     Mild, echo, 2012  . Febrile illness     May, 2013, question sinusitis  . Polymyositis 11/26/2012    PAST SURGICAL HISTORY: Past Surgical History  Procedure Laterality Date  . Splenectomy    . Hernia repair      left side  . Peg placement  06/2007  . Peg tube removal  12/2007  . Kyphosis surgery      multilevel vertebral  . Odontoid fracture surgery      anterior screw fixation  . Nasal sinus surgery      x2  . Cataract extraction      bi lateral    FAMILY HISTORY Family History  Problem Relation Age of Onset  . Breast cancer Sister   . Colon cancer Brother   . Prostate cancer Brother   . Stroke Mother   . Hypertension Mother   . Emphysema Father   . Diabetes Grandchild   . Heart attack Neg Hx   . Cancer Brother   . Cancer Sister   . Cancer Daughter     ADVANCED DIRECTIVES:  Patient does have advance healthcare directive, Patient does not desire to make any changes  HEALTH MAINTENANCE: Social History  Substance Use Topics  . Smoking status: Former Smoker --  0.50 packs/day for 31 years    Types: Cigarettes    Quit date: 06/29/1980  . Smokeless tobacco: Never Used  . Alcohol Use: No     Comment: rarely     Allergies  Allergen Reactions  . Sulfonamide Derivatives       OBJECTIVE:  There were no vitals filed for this visit. There is no weight on file to calculate BMI. ECOG FS:1 - Symptomatic but completely ambulatory  PHYSICAL EXAM: Gen. status: Patient is alert oriented not any acute distress Lymphatic system: Supraclavicular, cervical, axillary, inguinal lymph nodes are not palpable Head exam was generally normal. There was no scleral icterus or corneal arcus. Mucous membranes were moist. Cardiac: Tachycardia Abdominal exam revealed normal bowel sounds.  The abdomen was soft, non-tender, and without masses, organomegaly, or appreciable enlargement of the abdominal aorta. Examination of the skin revealed no evidence of significant rashes, suspicious appearing nevi or other concerning lesions. Neurologically, the patient was awake, alert, and oriented to person, place and time. There were no obvious focal neurologic abnormalities. Lower extremity on the left side there is a lady of redness extending from the upper thigh all the way to scrotal area. There is area of induration and tenderness Kyphoscoliosis: Osteoporosis and multiple vertebral compression fracture  Skin: Grade 1 rash LAB RESULTS:  No visits with results within 2 Day(s) from this visit. Latest known visit with results is:  Appointment on 11/17/2014  Component Date Value Ref Range Status  . WBC 11/17/2014 17.3* 3.8 - 10.6 K/uL Final  . RBC 11/17/2014 3.07* 4.40 - 5.90 MIL/uL Final  . Hemoglobin 11/17/2014 11.4* 13.0 - 18.0 g/dL Final  . HCT 11/17/2014 33.5* 40.0 - 52.0 % Final  . MCV 11/17/2014 109.3* 80.0 - 100.0 fL Final  . MCH 11/17/2014 37.1* 26.0 - 34.0 pg Final  . MCHC 11/17/2014 34.0  32.0 - 36.0 g/dL Final  . RDW 11/17/2014 16.6* 11.5 - 14.5 % Final  . Platelets 11/17/2014 248  150 - 440 K/uL Final  . Neutrophils Relative % 11/17/2014 88   Final  . Neutro Abs 11/17/2014 15.1* 1.4 - 6.5 K/uL Final  . Lymphocytes Relative 11/17/2014 6   Final  . Lymphs Abs 11/17/2014 1.0  1.0 - 3.6 K/uL Final  . Monocytes Relative 11/17/2014 6   Final  . Monocytes Absolute 11/17/2014 1.1* 0.2 - 1.0 K/uL Final  . Eosinophils Relative 11/17/2014 0   Final  . Eosinophils Absolute 11/17/2014 0.0  0 - 0.7 K/uL Final  . Basophils Relative 11/17/2014 0   Final  . Basophils Absolute 11/17/2014 0.0  0 - 0.1 K/uL Final  . Sodium 11/17/2014 131* 135 - 145 mmol/L Final   . Potassium 11/17/2014 4.7  3.5 - 5.1 mmol/L Final  . Chloride 11/17/2014 100* 101 - 111 mmol/L Final  . CO2 11/17/2014 28  22 - 32 mmol/L Final  . Glucose, Bld 11/17/2014 104* 65 - 99 mg/dL Final  . BUN 11/17/2014 37* 6 - 20 mg/dL Final  . Creatinine, Ser 11/17/2014 0.88  0.61 - 1.24 mg/dL Final  . Calcium 11/17/2014 8.1* 8.9 - 10.3 mg/dL Final  . Total Protein 11/17/2014 6.1* 6.5 - 8.1 g/dL Final  . Albumin 11/17/2014 3.2* 3.5 - 5.0 g/dL Final  . AST 11/17/2014 23  15 - 41 U/L Final  . ALT 11/17/2014 18  17 - 63 U/L Final  . Alkaline Phosphatase 11/17/2014 72  38 - 126 U/L Final  . Total Bilirubin 11/17/2014 1.0  0.3 - 1.2 mg/dL  Final  . GFR calc non Af Amer 11/17/2014 >60  >60 mL/min Final  . GFR calc Af Amer 11/17/2014 >60  >60 mL/min Final   Comment: (NOTE) The eGFR has been calculated using the CKD EPI equation. This calculation has not been validated in all clinical situations. eGFR's persistently <60 mL/min signify possible Chronic Kidney Disease.   . Anion gap 11/17/2014 3* 5 - 15 Final      STUDIES:  Imaging Results    US Venous Img Lower Unilateral Left  11/15/2014 CLINICAL DATA: Left lower extremity edema and erythema for 4 days EXAM: LEFT LOWER EXTREMITY VENOUS DOPPLER ULTRASOUND TECHNIQUE: Gray-scale sonography with graded compression, as well as color Doppler and duplex ultrasound were performed to evaluate the lower extremity deep venous systems from the level of the common femoral vein and including the common femoral, femoral, profunda femoral, popliteal and calf veins including the posterior tibial, peroneal and gastrocnemius veins when visible. The superficial great saphenous vein was also interrogated. Spectral Doppler was utilized to evaluate flow at rest and with distal augmentation maneuvers in the common femoral, femoral and popliteal veins. COMPARISON:  None. FINDINGS: Contralateral Common Femoral Vein: Respiratory phasicity is normal and symmetric with the symptomatic side. No evidence of thrombus. Normal compressibility. Common Femoral Vein: No evidence of thrombus. Normal compressibility, respiratory phasicity and response to augmentation. Saphenofemoral Junction: No evidence of thrombus. Normal compressibility and flow on color Doppler imaging. Profunda Femoral Vein: No evidence of thrombus. Normal compressibility and flow on color Doppler imaging. Femoral Vein: No evidence of thrombus. Normal compressibility, respiratory phasicity and response to augmentation. Popliteal Vein: No evidence of thrombus. Normal compressibility, respiratory phasicity and response to augmentation. Calf Veins: No evidence of thrombus. Normal compressibility and flow on color Doppler imaging. Superficial Great Saphenous Vein: No evidence of thrombus. Normal compressibility and flow on color Doppler imaging. Venous Reflux: None. Other Findings: Multiple prominent left inguinal lymph nodes the largest measuring 21 mm in greatest diameter. These demonstrate fatty hila with no significant cortical thickening and are presumably reactive. IMPRESSION: No evidence of deep venous thrombosis. Electronically Signed By: Skipper Cliche M.D. On: 11/15/2014 12:34     ASSESSMENT: Autoimmune hemolytic anemia with lymphoproliferative disease He is on prednisone 10 mg. Patient is somewhat immunocompromised because of prednisone as well as rituximab therapy. Patient was admitted in hospital with cellulitis of the left lower extremity. Which is not responding to intravenous antibiotic as outpatient possibility of underlying fasciitis or abscess cannot be ruled out. Possibility of recurrent lymphoma causing enlarged lymph node in pelvic area cannot be ruled out.  MEDICAL DECISION MAKING:  Cellulitis of lower extremity Admit patient to the hospital Infectious diseases  consult. I called Dr. Adrian Prows and discussed the situation CT scan has been ordered I discussed situation with Dr. Jamal Collin, general surgeon to follow up on the patient to see if any abscess drainage is required. Romie Minus UL vancomycin      No matching staging information was found for the patient.  Forest Gleason, MD 11/19/2014 4:25 PM

## 2014-11-19 NOTE — Progress Notes (Signed)
[place] @ [facility] [phone]  [phone]) [phone]     [name] [name] OB: [birth year]  MR#: [id number]  LPF#:[id number]  Patient Care Team: Juline Patch, MD as PCP - General (Family Medicine) Kathrynn Ducking, MD (Neurology) Forest Gleason, MD as Consulting Physician (Unknown Physician Specialty) Carlena Bjornstad, MD as Consulting Physician (Cardiology) Seeplaputhur Robinette Haines, MD (General Surgery)  CHIEF COMPLAINT:  Cellulitis of lower extremity not responding to outpatient intravenous therapy  Oncology History   Chief Complaint/Problem List  1.  Autoimmune hemolytic anemia. Previously received Prednisone taper.   2.  Splenectomy in 5/02.  3.  Gastric ulcer diagnosed on endoscopy and multiple biopsies are pending, November 2005. 4.  Chronic lymphocytic leukemia. 5.vitamin B 12 deficiency 6.  Recent admission in the hospital in June of 2016 with hemolytic anemia and hemoglobin of 5.5 patient received blood transfusion was started on steroid 7.  Patient was started on rituximab (October 20, 2014     Chronic lymphocytic leukemia   08/01/2008 Initial Diagnosis Chronic lymphocytic leukemia    Oncology Flowsheet 10/11/2014 10/12/2014 10/13/2014 10/20/2014 10/27/2014 11/03/2014 11/10/2014  cyanocobalamin ((VITAMIN B-12)) IM   - - - 1,000 mcg - 1,000 mcg  methylPREDNISolone sodium succinate 40 mg/mL (SOLU-MEDROL) IV - - - - - - -  predniSONE (DELTASONE) PO 80 mg 80 mg 80 mg - - - -  riTUXimab (RITUXAN) IV - - - 375 mg/m2 375 mg/m2 375 mg/m2 375 mg/m2  zolendronic acid (ZOMETA) IV   - - - - - -    INTERVAL HISTORY:  81 year old gentleman was recently admitted in the hospital with increasing shortness of breath.  Hemoglobin was 5.5.  Patient received blood transfusion patient has autoimmune hemolytic anemia secondary to call chronic lymphocytic leukemia.  Had received Rituxan in the past. Patient was started on prednisone 80 mg daily.  Hemoglobin improved to 10.5 patient is here for ongoing  evaluation and treatment consideration November 03, 2014 Patient is here for ongoing evaluation taking prednisone 40 mg daily which has been now decreased 30 mg by mouth daily hemoglobin is improving.  Tolerating Rituxan with minimal rash and no other side effect. November 10, 2014 Patient is here for his last chemotherapy cycle.  No chills.  No fever.  Hemoglobin is improved.  No rash.  No diarrhea.  Patient had skin infection in the left axillary area Also had number of questions regarding discontinuing Diflucan and Protonix On folic acid which will be continued.  November 18, 2014 Patient is here for further follow-up regarding cellulitis Received a single dose of vancomycin yesterday Redness all around the thigh has improved but patient has more pain and feeling somewhat weak and tired.  November 19, 2014 Patient was admitted in the hospital because of poor response to vancomycin.  Left thigh area is very painful.  More redness.  No chills.  No fever.  Patient was evaluated by Dr. Jamal Collin or as outpatient and suggested further evaluation with CT scan also getting infectious disease consult for broadening antibiotic coverage. Patient is feeling weak and tired. REVIEW OF SYSTEMS:    general status: Patient is feeling weak and tired.  No change in a performance status.  No chills.  No fever. HEENT:.  No evidence of stomatitis Lungs: No cough or shortness of breath Cardiac: No chest pain or paroxysmal nocturnal dyspnea GI: No nausea no vomiting no diarrhea no abdominal pain Skin: No rash Lower extremity redness and tenderness Neurological system: No tingling.  No numbness.  No other  focal signs Musculoskeletal system no bony pains  As per HPI. Otherwise, a complete review of systems is negatve.  PAST MEDICAL HISTORY: Past Medical History  Diagnosis Date  . CLL (chronic lymphoblastic leukemia) 2006    Rituxan treatment 2006  . Atrial septal aneurysm 2009    Insignificant, no, January, 2009  .  Infection of PEG site     cellulitis.Marland KitchenResolved  . Vertebral compression fracture     multiple levels  . Herpes zoster     right chest  . Aortic insufficiency 2009    mild, Mild, echo, 2012  . Diastolic dysfunction     mild  . Chest pain     EF 55%, echo, October, 2009, possible inferior and posterior borderline hypokinesis, patient has never  had cardiac catheterization  . Edema     EF 55%, echo, October, 2009, possible borderline inferior and posterior hypokinesis... heart catheterization has never been done(July 10, 2009)  . Myositis     Caused pulmonary insufficiency with muscle weakness in the past / Dr.Willis-neurology, Imuran and prednisone  . Calculus of kidney   . Inguinal hernia without mention of obstruction or gangrene, unilateral or unspecified, (not specified as recurrent)     right   . Unspecified gastritis and gastroduodenitis without mention of hemorrhage   . Abdominal pain, epigastric   . Nonspecific elevation of levels of transaminase or lactic acid dehydrogenase (LDH)   . Feeding difficulties and mismanagement   . Edema of male genital organs     Resolved, occurred during illness related to myositis  . Swelling of arm     left.Marland KitchenMarland KitchenResolved.... no DVT  . LFT elevation     Related to Imuran in the past... dose was adjusted  . Dyslipidemia     statins not used due to LFT abnormalities  . Shortness of breath     Breathing abnormalities related to muscle weakness from myositis  . RBBB (right bundle branch block)     Old  . Bradycardia     Sinus bradycardia, old  . Ejection fraction     EF 60%, echo, October, 2012  . Mitral regurgitation     Mild / moderate, echo, 2012  . Right ventricular dysfunction     Mild, echo, 2012  . Febrile illness     May, 2013, question sinusitis  . Polymyositis 11/26/2012    PAST SURGICAL HISTORY: Past Surgical History  Procedure Laterality Date  . Splenectomy    . Hernia repair      left side  . Peg placement  06/2007  .  Peg tube removal  12/2007  . Kyphosis surgery      multilevel vertebral  . Odontoid fracture surgery      anterior screw fixation  . Nasal sinus surgery      x2  . Cataract extraction      bi lateral    FAMILY HISTORY Family History  Problem Relation Age of Onset  . Breast cancer Sister   . Colon cancer Brother   . Prostate cancer Brother   . Stroke Mother   . Hypertension Mother   . Emphysema Father   . Diabetes Grandchild   . Heart attack Neg Hx   . Cancer Brother   . Cancer Sister   . Cancer Daughter     ADVANCED DIRECTIVES:  Patient does have advance healthcare directive, Patient   does not desire to make any changes  HEALTH MAINTENANCE: Social History  Substance Use Topics  . Smoking status:  Former Smoker -- 0.50 packs/day for 31 years    Types: Cigarettes    Quit date: 06/29/1980  . Smokeless tobacco: Never Used  . Alcohol Use: No     Comment: rarely      Allergies  Allergen Reactions  . Sulfonamide Derivatives       OBJECTIVE:  There were no vitals filed for this visit.   There is no weight on file to calculate BMI.    ECOG FS:1 - Symptomatic but completely ambulatory  PHYSICAL EXAM: Gen. status: Patient is alert oriented not any acute distress Lymphatic system: Supraclavicular, cervical, axillary, inguinal lymph nodes are not palpable Head exam was generally normal. There was no scleral icterus or corneal arcus. Mucous membranes were moist. Cardiac: Tachycardia Abdominal exam revealed normal bowel sounds. The abdomen was soft, non-tender, and without masses, organomegaly, or appreciable enlargement of the abdominal aorta. Examination of the skin revealed no evidence of significant rashes, suspicious appearing nevi or other concerning lesions. Neurologically, the patient was awake, alert, and oriented to person, place and time. There were no obvious focal neurologic abnormalities. Lower extremity on the left side there is a lady of redness extending  from the upper thigh all the way to scrotal area.  There is area of induration and tenderness Kyphoscoliosis: Osteoporosis and multiple vertebral compression fracture  Skin: Grade 1 rash LAB RESULTS:  No visits with results within 2 Day(s) from this visit. Latest known visit with results is:  Appointment on 11/17/2014  Component Date Value Ref Range Status  . WBC 11/17/2014 17.3* 3.8 - 10.6 K/uL Final  . RBC 11/17/2014 3.07* 4.40 - 5.90 MIL/uL Final  . Hemoglobin 11/17/2014 11.4* 13.0 - 18.0 g/dL Final  . HCT 11/17/2014 33.5* 40.0 - 52.0 % Final  . MCV 11/17/2014 109.3* 80.0 - 100.0 fL Final  . MCH 11/17/2014 37.1* 26.0 - 34.0 pg Final  . MCHC 11/17/2014 34.0  32.0 - 36.0 g/dL Final  . RDW 11/17/2014 16.6* 11.5 - 14.5 % Final  . Platelets 11/17/2014 248  150 - 440 K/uL Final  . Neutrophils Relative % 11/17/2014 88   Final  . Neutro Abs 11/17/2014 15.1* 1.4 - 6.5 K/uL Final  . Lymphocytes Relative 11/17/2014 6   Final  . Lymphs Abs 11/17/2014 1.0  1.0 - 3.6 K/uL Final  . Monocytes Relative 11/17/2014 6   Final  . Monocytes Absolute 11/17/2014 1.1* 0.2 - 1.0 K/uL Final  . Eosinophils Relative 11/17/2014 0   Final  . Eosinophils Absolute 11/17/2014 0.0  0 - 0.7 K/uL Final  . Basophils Relative 11/17/2014 0   Final  . Basophils Absolute 11/17/2014 0.0  0 - 0.1 K/uL Final  . Sodium 11/17/2014 131* 135 - 145 mmol/L Final  . Potassium 11/17/2014 4.7  3.5 - 5.1 mmol/L Final  . Chloride 11/17/2014 100* 101 - 111 mmol/L Final  . CO2 11/17/2014 28  22 - 32 mmol/L Final  . Glucose, Bld 11/17/2014 104* 65 - 99 mg/dL Final  . BUN 11/17/2014 37* 6 - 20 mg/dL Final  . Creatinine, Ser 11/17/2014 0.88  0.61 - 1.24 mg/dL Final  . Calcium 11/17/2014 8.1* 8.9 - 10.3 mg/dL Final  . Total Protein 11/17/2014 6.1* 6.5 - 8.1 g/dL Final  . Albumin 11/17/2014 3.2* 3.5 - 5.0 g/dL Final  . AST 11/17/2014 23  15 - 41 U/L Final  . ALT 11/17/2014 18  17 - 63 U/L Final  . Alkaline Phosphatase 11/17/2014 72   38 - 126 U/L Final  .  Total Bilirubin 11/17/2014 1.0  0.3 - 1.2 mg/dL Final  . GFR calc non Af Amer 11/17/2014 >60  >60 mL/min Final  . GFR calc Af Amer 11/17/2014 >60  >60 mL/min Final   Comment: (NOTE) The eGFR has been calculated using the CKD EPI equation. This calculation has not been validated in all clinical situations. eGFR's persistently <60 mL/min signify possible Chronic Kidney Disease.   . Anion gap 11/17/2014 3* 5 - 15 Final      STUDIES: US Venous Img Lower Unilateral Left  11/15/2014   CLINICAL DATA:  Left lower extremity edema and erythema for 4 days  EXAM: LEFT LOWER EXTREMITY VENOUS DOPPLER ULTRASOUND  TECHNIQUE: Gray-scale sonography with graded compression, as well as color Doppler and duplex ultrasound were performed to evaluate the lower extremity deep venous systems from the level of the common femoral vein and including the common femoral, femoral, profunda femoral, popliteal and calf veins including the posterior tibial, peroneal and gastrocnemius veins when visible. The superficial great saphenous vein was also interrogated. Spectral Doppler was utilized to evaluate flow at rest and with distal augmentation maneuvers in the common femoral, femoral and popliteal veins.  COMPARISON:  None.  FINDINGS: Contralateral Common Femoral Vein: Respiratory phasicity is normal and symmetric with the symptomatic side. No evidence of thrombus. Normal compressibility.  Common Femoral Vein: No evidence of thrombus. Normal compressibility, respiratory phasicity and response to augmentation.  Saphenofemoral Junction: No evidence of thrombus. Normal compressibility and flow on color Doppler imaging.  Profunda Femoral Vein: No evidence of thrombus. Normal compressibility and flow on color Doppler imaging.  Femoral Vein: No evidence of thrombus. Normal compressibility, respiratory phasicity and response to augmentation.  Popliteal Vein: No evidence of thrombus. Normal compressibility, respiratory  phasicity and response to augmentation.  Calf Veins: No evidence of thrombus. Normal compressibility and flow on color Doppler imaging.  Superficial Great Saphenous Vein: No evidence of thrombus. Normal compressibility and flow on color Doppler imaging.  Venous Reflux:  None.  Other Findings: Multiple prominent left inguinal lymph nodes the largest measuring 21 mm in greatest diameter. These demonstrate fatty hila with no significant cortical thickening and are presumably reactive.  IMPRESSION: No evidence of deep venous thrombosis.   Electronically Signed   By: Skipper Cliche M.D.   On: 11/15/2014 12:34    ASSESSMENT: Autoimmune hemolytic anemia with lymphoproliferative disease He  is on prednisone 10 mg.  Patient is somewhat immunocompromised because of prednisone as well as rituximab therapy. Patient was admitted in hospital with cellulitis of the left lower extremity.  Which is not responding to intravenous antibiotic as outpatient possibility of underlying fasciitis or abscess cannot be ruled out.  Possibility of recurrent lymphoma causing enlarged lymph node in pelvic area cannot be ruled out.  MEDICAL DECISION MAKING:  Cellulitis of lower extremity Admit patient to the hospital Infectious diseases consult.  I called Dr. Adrian Prows and discussed the situation CT scan has been ordered I discussed situation with Dr. Jamal Collin, general surgeon to follow up on the patient to see if any abscess drainage is required. Romie Minus UL vancomycin      No matching staging information was found for the patient.  Forest Gleason, MD   11/19/2014 4:25 PM

## 2014-11-19 NOTE — Progress Notes (Signed)
E note Pt not seen but I have ordered vanco, zosyn. Will add clinda as well I will see in AM. If worsens will need drainage.

## 2014-11-19 NOTE — Telephone Encounter (Signed)
Patient being admitted to [facility] Room 101 for cellulitis of left leg. Report called to [place].

## 2014-11-20 ENCOUNTER — Other Ambulatory Visit: Payer: Medicare Other

## 2014-11-20 ENCOUNTER — Inpatient Hospital Stay: Payer: Medicare Other

## 2014-11-20 ENCOUNTER — Inpatient Hospital Stay: Payer: Medicare Other | Admitting: Oncology

## 2014-11-20 LAB — CBC WITH DIFFERENTIAL/PLATELET
BASOS ABS: 0 10*3/uL (ref 0–0.1)
Basophils Relative: 0 %
Eosinophils Absolute: 0 10*3/uL (ref 0–0.7)
Eosinophils Relative: 0 %
HCT: 33.1 % — ABNORMAL LOW (ref 40.0–52.0)
HEMOGLOBIN: 10.9 g/dL — AB (ref 13.0–18.0)
LYMPHS ABS: 1.1 10*3/uL (ref 1.0–3.6)
Lymphocytes Relative: 13 %
MCH: 36.3 pg — AB (ref 26.0–34.0)
MCHC: 33.1 g/dL (ref 32.0–36.0)
MCV: 109.7 fL — AB (ref 80.0–100.0)
Monocytes Absolute: 1.1 10*3/uL — ABNORMAL HIGH (ref 0.2–1.0)
Monocytes Relative: 12 %
NEUTROS PCT: 75 %
Neutro Abs: 6.7 10*3/uL — ABNORMAL HIGH (ref 1.4–6.5)
Platelets: 331 10*3/uL (ref 150–440)
RBC: 3.01 MIL/uL — ABNORMAL LOW (ref 4.40–5.90)
RDW: 16.1 % — ABNORMAL HIGH (ref 11.5–14.5)
WBC: 8.9 10*3/uL (ref 3.8–10.6)

## 2014-11-20 LAB — CULTURE, BLOOD (ROUTINE X 2)
CULTURE: NO GROWTH
Culture: NO GROWTH

## 2014-11-20 MED ORDER — FUROSEMIDE 20 MG PO TABS
40.0000 mg | ORAL_TABLET | Freq: Every day | ORAL | Status: DC
  Administered 2014-11-20 – 2014-11-28 (×9): 40 mg via ORAL
  Filled 2014-11-20 (×9): qty 2

## 2014-11-20 MED ORDER — MYCOPHENOLATE MOFETIL 250 MG PO CAPS
500.0000 mg | ORAL_CAPSULE | Freq: Every day | ORAL | Status: DC
  Administered 2014-11-20 – 2014-11-28 (×9): 500 mg via ORAL
  Filled 2014-11-20 (×9): qty 2

## 2014-11-20 NOTE — Consult Note (Signed)
[facility]     Reason for Consult:Cellulitis    Referring Physician: Dr Oliva Bustard Date of Admission:  11/19/2014   Active Problems:   Cellulitis   HPI: [name] [name] is a 81 y.o. male with CLL, AIHA, s/p splenectomy, on steroids and rituximab who has been having L leg thigh cellulitis and swelling. It began approx 5 days ago and has progressed despite otpt abx including starting vancomycin. He denies any injuries or insect bites to the area.   He was admitted and started 8/10 on vanco and zoysn.  CT shows significant cellulitis, no abscess but some concern for fascitis This AM he states feels about 25040% better. No fevers, chills, ns   Problem list 1. Autoimmune hemolytic anemia. Previously received Prednisone taper.  2. Splenectomy in 5/02.  3. Gastric ulcer diagnosed on endoscopy and multiple biopsies are pending, November 2005. 4. Chronic lymphocytic leukemia. 5.vitamin B 12 deficiency 6. Recent admission in the hospital in June of 2016 with hemolytic anemia and hemoglobin of 5.5 patient received blood transfusion was started on steroid 7. Patient was started on rituximab (October 20, 2014   Past Medical History  Diagnosis Date  . CLL (chronic lymphoblastic leukemia) 2006    Rituxan treatment 2006  . Atrial septal aneurysm 2009    Insignificant, no, January, 2009  . Infection of PEG site     cellulitis.Marland KitchenResolved  . Vertebral compression fracture     multiple levels  . Herpes zoster     right chest  . Aortic insufficiency 2009    mild, Mild, echo, 2012  . Diastolic dysfunction     mild  . Chest pain     EF 55%, echo, October, 2009, possible inferior and posterior borderline hypokinesis, patient has never  had cardiac catheterization  . Edema     EF 55%, echo, October, 2009, possible borderline inferior and posterior hypokinesis... heart catheterization has never been done(July 10, 2009)  . Myositis     Caused pulmonary insufficiency with muscle  weakness in the past / Dr.Willis-neurology, Imuran and prednisone  . Calculus of kidney   . Inguinal hernia without mention of obstruction or gangrene, unilateral or unspecified, (not specified as recurrent)     right   . Unspecified gastritis and gastroduodenitis without mention of hemorrhage   . Abdominal pain, epigastric   . Nonspecific elevation of levels of transaminase or lactic acid dehydrogenase (LDH)   . Feeding difficulties and mismanagement   . Edema of male genital organs     Resolved, occurred during illness related to myositis  . Swelling of arm     left.Marland KitchenMarland KitchenResolved.... no DVT  . LFT elevation     Related to Imuran in the past... dose was adjusted  . Dyslipidemia     statins not used due to LFT abnormalities  . Shortness of breath     Breathing abnormalities related to muscle weakness from myositis  . RBBB (right bundle branch block)     Old  . Bradycardia     Sinus bradycardia, old  . Ejection fraction     EF 60%, echo, October, 2012  . Mitral regurgitation     Mild / moderate, echo, 2012  . Right ventricular dysfunction     Mild, echo, 2012  . Febrile illness     May, 2013, question sinusitis  . Polymyositis 11/26/2012   Past Surgical History  Procedure Laterality Date  . Splenectomy    . Hernia repair      left side  .  Peg placement  06/2007  . Peg tube removal  12/2007  . Kyphosis surgery      multilevel vertebral  . Odontoid fracture surgery      anterior screw fixation  . Nasal sinus surgery      x2  . Cataract extraction      bi lateral   Social History  Substance Use Topics  . Smoking status: Former Smoker -- 0.50 packs/day for 31 years    Types: Cigarettes    Quit date: 06/29/1968  . Smokeless tobacco: Former Systems developer    Types: Chew  . Alcohol Use: No     Comment: rarely   Family History  Problem Relation Age of Onset  . Breast cancer Sister   . Colon cancer Brother   . Prostate cancer Brother   . Stroke Mother   . Hypertension Mother    . Emphysema Father   . Diabetes Grandchild   . Heart attack Neg Hx   . Cancer Brother   . Cancer Sister   . Cancer Daughter     Allergies:  Allergies  Allergen Reactions  . Sulfonamide Derivatives     Current antibiotics: Antibiotics Given (last 72 hours)    Date/Time Action Medication Dose Rate   11/19/14 2019 Given   piperacillin-tazobactam (ZOSYN) IVPB 3.375 g 3.375 g 12.5 mL/hr   11/20/14 0001 Given   clindamycin (CLEOCIN) IVPB 900 mg 900 mg 100 mL/hr   11/20/14 0359 Given   vancomycin (VANCOCIN) IVPB 750 mg/150 ml premix 750 mg 150 mL/hr   11/20/14 0544 Given   piperacillin-tazobactam (ZOSYN) IVPB 3.375 g 3.375 g 12.5 mL/hr   11/20/14 0544 Given   clindamycin (CLEOCIN) IVPB 900 mg 900 mg 100 mL/hr      MEDICATIONS: . albuterol  3 mL Inhalation QID  . aspirin  81 mg Oral Daily  . budesonide  0.5 mg Nebulization BID  . calcium-vitamin D  1 tablet Oral BID  . citalopram  20 mg Oral Daily  . clindamycin (CLEOCIN) IV  900 mg Intravenous 3 times per day  . ferrous sulfate  325 mg Oral Q breakfast  . montelukast  10 mg Oral QHS  . pantoprazole  40 mg Oral Q1200  . piperacillin-tazobactam (ZOSYN)  IV  3.375 g Intravenous 3 times per day  . predniSONE  10 mg Oral Q breakfast  . temazepam  30 mg Oral QHS  . vancomycin  750 mg Intravenous Q12H    Review of Systems - 11 systems reviewed and negative per HPI   OBJECTIVE: Temp:  [96.7 F (35.9 C)-98.4 F (36.9 C)] 98.4 F (36.9 C) (08/11 0518) Pulse Rate:  [50-68] 50 (08/11 0518) Resp:  [14-19] 19 (08/10 1654) BP: (110-131)/(47-67) 123/52 mmHg (08/11 0518) SpO2:  [96 %-97 %] 97 % (08/11 0518) Weight:  [60.328 kg (133 lb)] 60.328 kg (133 lb) (08/10 1654) Physical Exam  Constitutional: He is oriented to person, place, and time.frail HENT: Mouth/Throat: Oropharynx is clear and moist. No oropharyngeal exudate.  Cardiovascular: Normal rate, regular rhythm and normal heart sounds. Exam reveals no gallop and no  friction rub.   Pulmonary/Chest: Effort normal and breath sounds normal. No respiratory distress. He has no wheezes.  Abdominal: Soft. Bowel sounds are normal. He exhibits no distension. There is no tenderness.  Lymphadenopathy: He has no cervical adenopathy.  Neurological: He is alert and oriented to person, place, and time.  Skin: R upper inner thigh with mid induration and erythema. Does not cross inguinal ligament. Mild ttp.  Psychiatric: He has a normal mood and affect. His behavior is normal.   LABS: Results for orders placed or performed during the hospital encounter of 11/19/14 (from the past 48 hour(s))  Basic metabolic panel     Status: Abnormal   Collection Time: 11/19/14  5:17 PM  Result Value Ref Range   Sodium 135 135 - 145 mmol/L   Potassium 4.7 3.5 - 5.1 mmol/L   Chloride 103 101 - 111 mmol/L   CO2 26 22 - 32 mmol/L   Glucose, Bld 91 65 - 99 mg/dL   BUN 24 (H) 6 - 20 mg/dL   Creatinine, Ser 0.72 0.61 - 1.24 mg/dL   Calcium 8.0 (L) 8.9 - 10.3 mg/dL   GFR calc non Af Amer >60 >60 mL/min   GFR calc Af Amer >60 >60 mL/min    Comment: (NOTE) The eGFR has been calculated using the CKD EPI equation. This calculation has not been validated in all clinical situations. eGFR's persistently <60 mL/min signify possible Chronic Kidney Disease.    Anion gap 6 5 - 15   No components found for: ESR, C REACTIVE PROTEIN MICRO: Recent Results (from the past 720 hour(s))  Blood culture (routine x 2)     Status: None (Preliminary result)   Collection Time: 11/15/14 10:34 AM  Result Value Ref Range Status   Specimen Description BLOOD RIGHT ASSIST CONTROL  Final   Special Requests BOTTLES DRAWN AEROBIC AND ANAEROBIC  1CC  Final   Culture NO GROWTH 4 DAYS  Final   Report Status PENDING  Incomplete  Blood culture (routine x 2)     Status: None (Preliminary result)   Collection Time: 11/15/14 10:41 AM  Result Value Ref Range Status   Specimen Description BLOOD LEFT ASSIST CONTROL   Final   Special Requests BOTTLES DRAWN AEROBIC AND ANAEROBIC  4CC  Final   Culture NO GROWTH 4 DAYS  Final   Report Status PENDING  Incomplete    IMAGING: Ct Pelvis W Contrast  11/19/2014   CLINICAL DATA:  Pain, swelling and erythema in the left groin and thigh for 1 week. No acute injury or prior relevant surgery. History of chronic lymphocytic leukemia.  EXAM: CT PELVIS AND BILATERAL LOWER EXTREMITIES WITH CONTRAST  TECHNIQUE: Multidetector CT imaging of the pelvis and bilateral lower extremities was performed according to the standard protocol following bolus administration of intravenous contrast. Multiplanar CT image reconstructions were also generated.  CONTRAST:  157m OMNIPAQUE IOHEXOL 300 MG/ML  SOLN  COMPARISON:  Doppler ultrasound 11/15/2014. Abdominal CT 10/11/2014.  FINDINGS: CT PELVIS FINDINGS  No intrapelvic inflammatory changes or fluid collections demonstrated. There is moderate stool throughout the colon. The appendix appears normal.  There is aortoiliac atherosclerosis and moderate enlargement of the prostate gland. The bladder appears unremarkable. Bilateral scrotal hydroceles noted.  There are postsurgical changes in the left inguinal region without evidence of significant recurrent hernia.  The bones are demineralized without acute findings. 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 Patient is status post spinal augmentation at L4 and L5.  CT BILATERAL LOWER EXTREMITY FINDINGS  There is asymmetric enlargement of the left thigh with diffuse dermal thickening. There is asymmetric subcutaneous edema in the proximal thigh anteriorly and medially. More distally, this edema is more circumferential. No focal fluid collections are identified. Edema extends into the fascia within the posterior compartment. No intramuscular fluid collections identified.  There are probable reactive lymph nodes within the left groin. There is no significant hip joint effusion.  Diffuse  vascular  calcifications are noted throughout the femoral arteries. The left femoral vein is opacified and appears patent. There is limited contrast material within the right femoral vein. No DVT demonstrated.  There is no evidence of femoral bone destruction, acute fracture or dislocation.  IMPRESSION: 1. Findings are consistent with left thigh cellulitis and possible fasciitis. No focal fluid collection to suggest abscess. 2. No evidence of osteomyelitis. 3. No significant intrapelvic inflammatory changes. 4. Diffuse atherosclerosis with asymmetric femoral vein enhancement (decreased on the right). This asymmetry may be secondary to hyperemia on the left. 5. Bilateral scrotal hydroceles.   Electronically Signed   By: Richardean Sale M.D.   On: 11/19/2014 18:59   US Venous Img Lower Unilateral Left  11/15/2014   CLINICAL DATA:  Left lower extremity edema and erythema for 4 days  EXAM: LEFT LOWER EXTREMITY VENOUS DOPPLER ULTRASOUND  TECHNIQUE: Gray-scale sonography with graded compression, as well as color Doppler and duplex ultrasound were performed to evaluate the lower extremity deep venous systems from the level of the common femoral vein and including the common femoral, femoral, profunda femoral, popliteal and calf veins including the posterior tibial, peroneal and gastrocnemius veins when visible. The superficial great saphenous vein was also interrogated. Spectral Doppler was utilized to evaluate flow at rest and with distal augmentation maneuvers in the common femoral, femoral and popliteal veins.  COMPARISON:  None.  FINDINGS: Contralateral Common Femoral Vein: Respiratory phasicity is normal and symmetric with the symptomatic side. No evidence of thrombus. Normal compressibility.  Common Femoral Vein: No evidence of thrombus. Normal compressibility, respiratory phasicity and response to augmentation.  Saphenofemoral Junction: No evidence of thrombus. Normal compressibility and flow on color Doppler imaging.   Profunda Femoral Vein: No evidence of thrombus. Normal compressibility and flow on color Doppler imaging.  Femoral Vein: No evidence of thrombus. Normal compressibility, respiratory phasicity and response to augmentation.  Popliteal Vein: No evidence of thrombus. Normal compressibility, respiratory phasicity and response to augmentation.  Calf Veins: No evidence of thrombus. Normal compressibility and flow on color Doppler imaging.  Superficial Great Saphenous Vein: No evidence of thrombus. Normal compressibility and flow on color Doppler imaging.  Venous Reflux:  None.  Other Findings: Multiple prominent left inguinal lymph nodes the largest measuring 21 mm in greatest diameter. These demonstrate fatty hila with no significant cortical thickening and are presumably reactive.  IMPRESSION: No evidence of deep venous thrombosis.   Electronically Signed   By: Skipper Cliche M.D.   On: 11/15/2014 12:34   Ct Extrem Lower W Cm Bil  11/19/2014   CLINICAL DATA:  Pain, swelling and erythema in the left groin and thigh for 1 week. No acute injury or prior relevant surgery. History of chronic lymphocytic leukemia.  EXAM: CT PELVIS AND BILATERAL LOWER EXTREMITIES WITH CONTRAST  TECHNIQUE: Multidetector CT imaging of the pelvis and bilateral lower extremities was performed according to the standard protocol following bolus administration of intravenous contrast. Multiplanar CT image reconstructions were also generated.  CONTRAST:  167m OMNIPAQUE IOHEXOL 300 MG/ML  SOLN  COMPARISON:  Doppler ultrasound 11/15/2014. Abdominal CT 10/11/2014.  FINDINGS: CT PELVIS FINDINGS  No intrapelvic inflammatory changes or fluid collections demonstrated. There is moderate stool throughout the colon. The appendix appears normal.  There is aortoiliac atherosclerosis and moderate enlargement of the prostate gland. The bladder appears unremarkable. Bilateral scrotal hydroceles noted.  There are postsurgical changes in the left inguinal region  without evidence of significant recurrent hernia.  The bones are demineralized without acute findings.  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 Patient is status post spinal augmentation at L4 and L5.  CT BILATERAL LOWER EXTREMITY FINDINGS  There is asymmetric enlargement of the left thigh with diffuse dermal thickening. There is asymmetric subcutaneous edema in the proximal thigh anteriorly and medially. More distally, this edema is more circumferential. No focal fluid collections are identified. Edema extends into the fascia within the posterior compartment. No intramuscular fluid collections identified.  There are probable reactive lymph nodes within the left groin. There is no significant hip joint effusion.  Diffuse vascular calcifications are noted throughout the femoral arteries. The left femoral vein is opacified and appears patent. There is limited contrast material within the right femoral vein. No DVT demonstrated.  There is no evidence of femoral bone destruction, acute fracture or dislocation.  IMPRESSION: 1. Findings are consistent with left thigh cellulitis and possible fasciitis. No focal fluid collection to suggest abscess. 2. No evidence of osteomyelitis. 3. No significant intrapelvic inflammatory changes. 4. Diffuse atherosclerosis with asymmetric femoral vein enhancement (decreased on the right). This asymmetry may be secondary to hyperemia on the left. 5. Bilateral scrotal hydroceles.   Electronically Signed   By: Richardean Sale M.D.   On: 11/19/2014 18:59    Assessment:   [name] [name] is a 81 y.o. male with CLL, AIHA, s/p splenectomy, on steroids and rituximab admitted with R upper thigh cellulitis and possible fascitis. Clinically much improved with IV abx  Recommendations Continue IV vanco zosyn and clinda for now Clinically follow. If worsens will need surgery Thank you very much for allowing me to participate in the care of this patient. Please call with questions.    Cheral Marker. Ola Spurr, MD

## 2014-11-20 NOTE — Plan of Care (Addendum)
Problem: Discharge Progression Outcomes Goal: Discharge plan in place and appropriate Outcome: Progressing Individualization of Care Pt prefers to be called [name] [name] Hx of CLL, Atrial septal aneurysm, vertebral compression fractures, herpes zoster, aortic insufficiency, edema, myositis, SOB, RBBB.  Admitted for cellulitis of the left leg.    Direct admit from [facility].    1.  Discharge plan:  ID following - Vanco, Zosyn, and Cleocin IV scheduled.  Surgeon plans to review CT scan this morning and will decide if patient needs draining and debridement.   2.  Pain:  Patient cited pain from wound 6/10.  Norco admitted per eMAR with pain resolved upon reassessment. 3.  Hemodynamics:  Patient afebrile and VSS this shift.  BP 131/48 mmHg  Pulse 62  Temp(Src) 98.4 F (36.9 C) (Oral)  Resp 19  Ht 5\' 6"  (1.676 m)  Wt 133 lb (60.328 kg)  BMI 21.48 kg/m2  SpO2 97% 4.  Diet:  Patient on regular diet and tolerating well.   5.  Activity:  Patient up at bedside with urinal during the evening.  Gait steady and tolerating well. 6.  Wound Improvement:  Redness from groin to below knee.  Scrotum to foot edematous.  Lower left leg swollen with +2 pitting edema.  ID and surgeon following.

## 2014-11-20 NOTE — Plan of Care (Signed)
Problem: Discharge Progression Outcomes Goal: Barriers To Progression Addressed/Resolved Outcome: Progressing Pts VSS, A&O on room air. Pt reporting pain and receiving PO Norco, and reported 100% relief. No fall or injury and no S/S of skin breakdown. Pt afebrile with no new s/s of infection, continues to receive IV abx. Pts family at the bedside, will continue to monitor.

## 2014-11-20 NOTE — Progress Notes (Signed)
   11/20/14 1310  Clinical Encounter Type  Visited With Patient and family together  Visit Type Initial  Spiritual Encounters  Spiritual Needs Prayer  Provided pastoral support, compassion and prayer to patient and his daughter in unit.  [place] [phone]

## 2014-11-20 NOTE — Progress Notes (Signed)
[place] @ [facility] [phone]  [phone]     [name] [name] OB: August 01, 1929  MR#: [id number]  ERD#:[id number]  Patient Care Team: Juline Patch, MD as PCP - General (Family Medicine) Kathrynn Ducking, MD (Neurology) Forest Gleason, MD as Consulting Physician (Unknown Physician Specialty) Carlena Bjornstad, MD as Consulting Physician (Cardiology) Seeplaputhur Robinette Haines, MD (General Surgery)  CHIEF COMPLAINT:  Cellulitis of lower extremity not responding to outpatient intravenous therapy  Oncology History   Chief Complaint/Problem List  1.  Autoimmune hemolytic anemia. Previously received Prednisone taper.   2.  Splenectomy in 5/02.  3.  Gastric ulcer diagnosed on endoscopy and multiple biopsies are pending, November 2005. 4.  Chronic lymphocytic leukemia. 5.vitamin B 12 deficiency 6.  Recent admission in the hospital in June of 2016 with hemolytic anemia and hemoglobin of 5.5 patient received blood transfusion was started on steroid 7.  Patient was started on rituximab (October 20, 2014     Chronic lymphocytic leukemia   08/01/2008 Initial Diagnosis Chronic lymphocytic leukemia    Oncology Flowsheet 10/12/2014 10/13/2014 10/20/2014 10/27/2014 11/03/2014 11/10/2014 11/20/2014  cyanocobalamin ((VITAMIN B-12)) IM - - - 1,000 mcg - 1,000 mcg -  methylPREDNISolone sodium succinate 40 mg/mL (SOLU-MEDROL) IV - - - - - - -  predniSONE (DELTASONE) PO 80 mg 80 mg - - - - 10 mg  riTUXimab (RITUXAN) IV - - 375 mg/m2 375 mg/m2 375 mg/m2 375 mg/m2 -  zolendronic acid (ZOMETA) IV - - - - - - -    INTERVAL HISTORY:  81 year old gentleman was admitted in the hospital with cellulitis of the left lower extremity. Patient was seen by surgical service as well as by infectious disease specialist On multiple antibiotics including Zosyn, vancomycin and clindamycin Significant improvement in the redness and pain CT scan has been done reviewed there is no evidence of abscess REVIEW OF SYSTEMS:      general status: Patient is feeling weak and tired.  No change in a performance status.  No chills.  No fever. HEENT:.  No evidence of stomatitis Lungs: No cough or shortness of breath Cardiac: No chest pain or paroxysmal nocturnal dyspnea GI: No nausea no vomiting no diarrhea no abdominal pain Skin: No rash Lower extremity redness and tenderness Neurological system: No tingling.  No numbness.  No other focal signs Musculoskeletal system no bony pains  As per HPI. Otherwise, a complete review of systems is negatve.  PAST MEDICAL HISTORY: Past Medical History  Diagnosis Date  . CLL (chronic lymphoblastic leukemia) 2006    Rituxan treatment 2006  . Atrial septal aneurysm 2009    Insignificant, no, January, 2009  . Infection of PEG site     cellulitis.Marland KitchenResolved  . Vertebral compression fracture     multiple levels  . Herpes zoster     right chest  . Aortic insufficiency 2009    mild, Mild, echo, 2012  . Diastolic dysfunction     mild  . Chest pain     EF 55%, echo, October, 2009, possible inferior and posterior borderline hypokinesis, patient has never  had cardiac catheterization  . Edema     EF 55%, echo, October, 2009, possible borderline inferior and posterior hypokinesis... heart catheterization has never been done(July 10, 2009)  . Myositis     Caused pulmonary insufficiency with muscle weakness in the past / Dr.Willis-neurology, Imuran and prednisone  . Calculus of kidney   . Inguinal hernia without mention of obstruction or gangrene, unilateral or unspecified, (not specified  as recurrent)     right   . Unspecified gastritis and gastroduodenitis without mention of hemorrhage   . Abdominal pain, epigastric   . Nonspecific elevation of levels of transaminase or lactic acid dehydrogenase (LDH)   . Feeding difficulties and mismanagement   . Edema of male genital organs     Resolved, occurred during illness related to myositis  . Swelling of arm     left.Marland KitchenMarland KitchenResolved....  no DVT  . LFT elevation     Related to Imuran in the past... dose was adjusted  . Dyslipidemia     statins not used due to LFT abnormalities  . Shortness of breath     Breathing abnormalities related to muscle weakness from myositis  . RBBB (right bundle branch block)     Old  . Bradycardia     Sinus bradycardia, old  . Ejection fraction     EF 60%, echo, October, 2012  . Mitral regurgitation     Mild / moderate, echo, 2012  . Right ventricular dysfunction     Mild, echo, 2012  . Febrile illness     May, 2013, question sinusitis  . Polymyositis 11/26/2012    PAST SURGICAL HISTORY: Past Surgical History  Procedure Laterality Date  . Splenectomy    . Hernia repair      left side  . Peg placement  06/2007  . Peg tube removal  12/2007  . Kyphosis surgery      multilevel vertebral  . Odontoid fracture surgery      anterior screw fixation  . Nasal sinus surgery      x2  . Cataract extraction      bi lateral    FAMILY HISTORY Family History  Problem Relation Age of Onset  . Breast cancer Sister   . Colon cancer Brother   . Prostate cancer Brother   . Stroke Mother   . Hypertension Mother   . Emphysema Father   . Diabetes Grandchild   . Heart attack Neg Hx   . Cancer Brother   . Cancer Sister   . Cancer Daughter     ADVANCED DIRECTIVES:  Patient does have advance healthcare directive, Patient   does not desire to make any changes  HEALTH MAINTENANCE: Social History  Substance Use Topics  . Smoking status: Former Smoker -- 0.50 packs/day for 31 years    Types: Cigarettes    Quit date: 06/29/1968  . Smokeless tobacco: Former Systems developer    Types: Chew  . Alcohol Use: No     Comment: rarely      Allergies  Allergen Reactions  . Sulfonamide Derivatives       OBJECTIVE:  Filed Vitals:   11/20/14 0518  BP: 123/52  Pulse: 50  Temp: 98.4 F (36.9 C)  Resp:      Body mass index is 21.48 kg/(m^2).    ECOG FS:1 - Symptomatic but completely  ambulatory  PHYSICAL EXAM: Gen. status: Patient is alert oriented not any acute distress Lymphatic system: Supraclavicular, cervical, axillary, inguinal lymph nodes are not palpable Head exam was generally normal. There was no scleral icterus or corneal arcus. Mucous membranes were moist. Cardiac: Tachycardia Abdominal exam revealed normal bowel sounds. The abdomen was soft, non-tender, and without masses, organomegaly, or appreciable enlargement of the abdominal aorta. Examination of the skin revealed no evidence of significant rashes, suspicious appearing nevi or other concerning lesions. Neurologically, the patient was awake, alert, and oriented to person, place and time. There were no obvious  focal neurologic abnormalities. There is significant improvement in redness and pain. Kyphoscoliosis: Osteoporosis and multiple vertebral compression fracture  Skin: Grade 1 rash LAB RESULTS:  Admission on 11/19/2014  Component Date Value Ref Range Status  . Sodium 11/19/2014 135  135 - 145 mmol/L Final  . Potassium 11/19/2014 4.7  3.5 - 5.1 mmol/L Final  . Chloride 11/19/2014 103  101 - 111 mmol/L Final  . CO2 11/19/2014 26  22 - 32 mmol/L Final  . Glucose, Bld 11/19/2014 91  65 - 99 mg/dL Final  . BUN 11/19/2014 24* 6 - 20 mg/dL Final  . Creatinine, Ser 11/19/2014 0.72  0.61 - 1.24 mg/dL Final  . Calcium 11/19/2014 8.0* 8.9 - 10.3 mg/dL Final  . GFR calc non Af Amer 11/19/2014 >60  >60 mL/min Final  . GFR calc Af Amer 11/19/2014 >60  >60 mL/min Final   Comment: (NOTE) The eGFR has been calculated using the CKD EPI equation. This calculation has not been validated in all clinical situations. eGFR's persistently <60 mL/min signify possible Chronic Kidney Disease.   . Anion gap 11/19/2014 6  5 - 15 Final      STUDIES: Ct Pelvis W Contrast  11/19/2014   CLINICAL DATA:  Pain, swelling and erythema in the left groin and thigh for 1 week. No acute injury or prior relevant surgery. History  of chronic lymphocytic leukemia.  EXAM: CT PELVIS AND BILATERAL LOWER EXTREMITIES WITH CONTRAST  TECHNIQUE: Multidetector CT imaging of the pelvis and bilateral lower extremities was performed according to the standard protocol following bolus administration of intravenous contrast. Multiplanar CT image reconstructions were also generated.  CONTRAST:  133m OMNIPAQUE IOHEXOL 300 MG/ML  SOLN  COMPARISON:  Doppler ultrasound 11/15/2014. Abdominal CT 10/11/2014.  FINDINGS: CT PELVIS FINDINGS  No intrapelvic inflammatory changes or fluid collections demonstrated. There is moderate stool throughout the colon. The appendix appears normal.  There is aortoiliac atherosclerosis and moderate enlargement of the prostate gland. The bladder appears unremarkable. Bilateral scrotal hydroceles noted.  There are postsurgical changes in the left inguinal region without evidence of significant recurrent hernia.  The bones are demineralized without acute findings. 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 Patient is status post spinal augmentation at L4 and L5.  CT BILATERAL LOWER EXTREMITY FINDINGS  There is asymmetric enlargement of the left thigh with diffuse dermal thickening. There is asymmetric subcutaneous edema in the proximal thigh anteriorly and medially. More distally, this edema is more circumferential. No focal fluid collections are identified. Edema extends into the fascia within the posterior compartment. No intramuscular fluid collections identified.  There are probable reactive lymph nodes within the left groin. There is no significant hip joint effusion.  Diffuse vascular calcifications are noted throughout the femoral arteries. The left femoral vein is opacified and appears patent. There is limited contrast material within the right femoral vein. No DVT demonstrated.  There is no evidence of femoral bone destruction, acute fracture or dislocation.  IMPRESSION: 1. Findings are consistent with left thigh  cellulitis and possible fasciitis. No focal fluid collection to suggest abscess. 2. No evidence of osteomyelitis. 3. No significant intrapelvic inflammatory changes. 4. Diffuse atherosclerosis with asymmetric femoral vein enhancement (decreased on the right). This asymmetry may be secondary to hyperemia on the left. 5. Bilateral scrotal hydroceles.   Electronically Signed   By: WRichardean SaleM.D.   On: 11/19/2014 18:59   UKoreaVenous Img Lower Unilateral Left  11/15/2014   CLINICAL DATA:  Left lower extremity edema and erythema for  4 days  EXAM: LEFT LOWER EXTREMITY VENOUS DOPPLER ULTRASOUND  TECHNIQUE: Gray-scale sonography with graded compression, as well as color Doppler and duplex ultrasound were performed to evaluate the lower extremity deep venous systems from the level of the common femoral vein and including the common femoral, femoral, profunda femoral, popliteal and calf veins including the posterior tibial, peroneal and gastrocnemius veins when visible. The superficial great saphenous vein was also interrogated. Spectral Doppler was utilized to evaluate flow at rest and with distal augmentation maneuvers in the common femoral, femoral and popliteal veins.  COMPARISON:  None.  FINDINGS: Contralateral Common Femoral Vein: Respiratory phasicity is normal and symmetric with the symptomatic side. No evidence of thrombus. Normal compressibility.  Common Femoral Vein: No evidence of thrombus. Normal compressibility, respiratory phasicity and response to augmentation.  Saphenofemoral Junction: No evidence of thrombus. Normal compressibility and flow on color Doppler imaging.  Profunda Femoral Vein: No evidence of thrombus. Normal compressibility and flow on color Doppler imaging.  Femoral Vein: No evidence of thrombus. Normal compressibility, respiratory phasicity and response to augmentation.  Popliteal Vein: No evidence of thrombus. Normal compressibility, respiratory phasicity and response to augmentation.   Calf Veins: No evidence of thrombus. Normal compressibility and flow on color Doppler imaging.  Superficial Great Saphenous Vein: No evidence of thrombus. Normal compressibility and flow on color Doppler imaging.  Venous Reflux:  None.  Other Findings: Multiple prominent left inguinal lymph nodes the largest measuring 21 mm in greatest diameter. These demonstrate fatty hila with no significant cortical thickening and are presumably reactive.  IMPRESSION: No evidence of deep venous thrombosis.   Electronically Signed   By: Skipper Cliche M.D.   On: 11/15/2014 12:34   Ct Extrem Lower W Cm Bil  11/19/2014   CLINICAL DATA:  Pain, swelling and erythema in the left groin and thigh for 1 week. No acute injury or prior relevant surgery. History of chronic lymphocytic leukemia.  EXAM: CT PELVIS AND BILATERAL LOWER EXTREMITIES WITH CONTRAST  TECHNIQUE: Multidetector CT imaging of the pelvis and bilateral lower extremities was performed according to the standard protocol following bolus administration of intravenous contrast. Multiplanar CT image reconstructions were also generated.  CONTRAST:  11m OMNIPAQUE IOHEXOL 300 MG/ML  SOLN  COMPARISON:  Doppler ultrasound 11/15/2014. Abdominal CT 10/11/2014.  FINDINGS: CT PELVIS FINDINGS  No intrapelvic inflammatory changes or fluid collections demonstrated. There is moderate stool throughout the colon. The appendix appears normal.  There is aortoiliac atherosclerosis and moderate enlargement of the prostate gland. The bladder appears unremarkable. Bilateral scrotal hydroceles noted.  There are postsurgical changes in the left inguinal region without evidence of significant recurrent hernia.  The bones are demineralized without acute findings. 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 Patient is status post spinal augmentation at L4 and L5.  CT BILATERAL LOWER EXTREMITY FINDINGS  There is asymmetric enlargement of the left thigh with diffuse dermal thickening. There is  asymmetric subcutaneous edema in the proximal thigh anteriorly and medially. More distally, this edema is more circumferential. No focal fluid collections are identified. Edema extends into the fascia within the posterior compartment. No intramuscular fluid collections identified.  There are probable reactive lymph nodes within the left groin. There is no significant hip joint effusion.  Diffuse vascular calcifications are noted throughout the femoral arteries. The left femoral vein is opacified and appears patent. There is limited contrast material within the right femoral vein. No DVT demonstrated.  There is no evidence of femoral bone destruction, acute fracture or dislocation.  IMPRESSION:  1. Findings are consistent with left thigh cellulitis and possible fasciitis. No focal fluid collection to suggest abscess. 2. No evidence of osteomyelitis. 3. No significant intrapelvic inflammatory changes. 4. Diffuse atherosclerosis with asymmetric femoral vein enhancement (decreased on the right). This asymmetry may be secondary to hyperemia on the left. 5. Bilateral scrotal hydroceles.   Electronically Signed   By: Richardean Sale M.D.   On: 11/19/2014 18:59    ASSESSMENT: Autoimmune hemolytic anemia with lymphoproliferative disease He  is on prednisone 10 mg.  Patient is somewhat immunocompromised because of prednisone as well as rituximab therapy. Cellulitis of lower extremity CT scan has been consistent with fasciitis.  No evidence of enlarged lymph node or abscess MEDICAL DECISION MAKING:  Cellulitis of lower extremity CT scan of the lower extremity as well as pelvis has been reviewed.  Independently.  There is significant improvement in redness.  I discussed situation with infectious disease specialist as well as with surgeon will continue antibiotic for several days until redness and pain completely resolves.  VTE prophylaxis has been ordered. Early ambulation has been ordered. Continue some of the oral  medication      No matching staging information was found for the patient.  Forest Gleason, MD   11/20/2014 3:14 PM

## 2014-11-21 LAB — COMPREHENSIVE METABOLIC PANEL
ALT: 28 U/L (ref 17–63)
AST: 32 U/L (ref 15–41)
Albumin: 2.5 g/dL — ABNORMAL LOW (ref 3.5–5.0)
Alkaline Phosphatase: 62 U/L (ref 38–126)
Anion gap: 6 (ref 5–15)
BILIRUBIN TOTAL: 0.9 mg/dL (ref 0.3–1.2)
BUN: 16 mg/dL (ref 6–20)
CHLORIDE: 102 mmol/L (ref 101–111)
CO2: 29 mmol/L (ref 22–32)
CREATININE: 0.8 mg/dL (ref 0.61–1.24)
Calcium: 8.6 mg/dL — ABNORMAL LOW (ref 8.9–10.3)
GFR calc Af Amer: >60 mL/min (ref >60)
GFR calc non Af Amer: >60 mL/min (ref >60)
Glucose, Bld: 116 mg/dL — ABNORMAL HIGH (ref 65–99)
Potassium: 4.1 mmol/L (ref 3.5–5.1)
Sodium: 137 mmol/L (ref 135–145)
TOTAL PROTEIN: 5.2 g/dL — AB (ref 6.5–8.1)

## 2014-11-21 LAB — CBC WITH DIFFERENTIAL/PLATELET
BASOS PCT: 1 %
Basophils Absolute: 0.1 10*3/uL (ref 0–0.1)
EOS ABS: 0 10*3/uL (ref 0–0.7)
Eosinophils Relative: 0 %
HCT: 30.5 % — ABNORMAL LOW (ref 40.0–52.0)
HEMOGLOBIN: 10.1 g/dL — AB (ref 13.0–18.0)
Lymphocytes Relative: 42 %
Lymphs Abs: 3.9 10*3/uL — ABNORMAL HIGH (ref 1.0–3.6)
MCH: 36.4 pg — AB (ref 26.0–34.0)
MCHC: 33.1 g/dL (ref 32.0–36.0)
MCV: 110 fL — AB (ref 80.0–100.0)
MONOS PCT: 14 %
Monocytes Absolute: 1.2 10*3/uL — ABNORMAL HIGH (ref 0.2–1.0)
Neutro Abs: 3.9 10*3/uL (ref 1.4–6.5)
Neutrophils Relative %: 43 %
Platelets: 320 10*3/uL (ref 150–440)
RBC: 2.78 MIL/uL — ABNORMAL LOW (ref 4.40–5.90)
RDW: 16 % — ABNORMAL HIGH (ref 11.5–14.5)
WBC: 9.1 10*3/uL (ref 3.8–10.6)

## 2014-11-21 LAB — VANCOMYCIN, TROUGH: Vancomycin Tr: 25 ug/mL (ref 10–20)

## 2014-11-21 LAB — CREATININE, SERUM
Creatinine, Ser: 0.87 mg/dL (ref 0.61–1.24)
GFR calc Af Amer: >60 mL/min (ref >60)
GFR calc non Af Amer: >60 mL/min (ref >60)

## 2014-11-21 MED ORDER — VANCOMYCIN HCL IN DEXTROSE 750-5 MG/150ML-% IV SOLN
750.0000 mg | INTRAVENOUS | Status: DC
  Administered 2014-11-22 – 2014-11-23 (×3): 750 mg via INTRAVENOUS
  Filled 2014-11-21 (×3): qty 150

## 2014-11-21 MED ORDER — VANCOMYCIN HCL IN DEXTROSE 750-5 MG/150ML-% IV SOLN
750.0000 mg | INTRAVENOUS | Status: DC
  Filled 2014-11-21: qty 150

## 2014-11-21 MED ORDER — VANCOMYCIN HCL IN DEXTROSE 750-5 MG/150ML-% IV SOLN: 750.0000 mg | INTRAVENOUS | Status: DC

## 2014-11-21 NOTE — Progress Notes (Signed)
ANTIBIOTIC CONSULT NOTE - INITIAL  Pharmacy Consult for Vancomycin and Zosyn  Indication: upper left leg abscess.   Allergies  Allergen Reactions  . Sulfonamide Derivatives     Patient Measurements: Height: 5\' 6"  (167.6 cm) Weight: 133 lb (60.328 kg) IBW/kg (Calculated) : 63.8 Adjusted Body Weight:   Vital Signs: Temp: 98.3 F (36.8 C) (08/12 1400) Temp Source: Oral (08/12 1400) BP: 124/52 mmHg (08/12 1400) Pulse Rate: 68 (08/12 1400) Intake/Output from previous day: 08/11 0701 - 08/12 0700 In: 720 [P.O.:720] Out: 350 [Urine:350] Intake/Output from this shift: Total I/O In: 580 [P.O.:480; IV Piggyback:100] Out: 800 [Urine:800]  Labs:  Recent Labs  11/19/14 1717 11/20/14 1630 11/21/14 0423 11/21/14 1537  WBC  --  8.9 9.1  --   HGB  --  10.9* 10.1*  --   PLT  --  331 320  --   CREATININE 0.72  --  0.80 0.87   Estimated Creatinine Clearance: 55.8 mL/min (by C-G formula based on Cr of 0.87).  Recent Labs  11/21/14 1537  [place] 25*     Microbiology: Recent Results (from the past 720 hour(s))  Blood culture (routine x 2)     Status: None   Collection Time: 11/15/14 10:34 AM  Result Value Ref Range Status   Specimen Description BLOOD RIGHT ASSIST CONTROL  Final   Special Requests BOTTLES DRAWN AEROBIC AND ANAEROBIC  1CC  Final   Culture NO GROWTH 5 DAYS  Final   Report Status 11/20/2014 FINAL  Final  Blood culture (routine x 2)     Status: None   Collection Time: 11/15/14 10:41 AM  Result Value Ref Range Status   Specimen Description BLOOD LEFT ASSIST CONTROL  Final   Special Requests BOTTLES DRAWN AEROBIC AND ANAEROBIC  4CC  Final   Culture NO GROWTH 5 DAYS  Final   Report Status 11/20/2014 FINAL  Final    Medical History: Past Medical History  Diagnosis Date  . CLL (chronic lymphoblastic leukemia) 2006    Rituxan treatment 2006  . Atrial septal aneurysm 2009    Insignificant, no, January, 2009  . Infection of PEG site    cellulitis.Marland KitchenResolved  . Vertebral compression fracture     multiple levels  . Herpes zoster     right chest  . Aortic insufficiency 2009    mild, Mild, echo, 2012  . Diastolic dysfunction     mild  . Chest pain     EF 55%, echo, October, 2009, possible inferior and posterior borderline hypokinesis, patient has never  had cardiac catheterization  . Edema     EF 55%, echo, October, 2009, possible borderline inferior and posterior hypokinesis... heart catheterization has never been done(July 10, 2009)  . Myositis     Caused pulmonary insufficiency with muscle weakness in the past / Dr.Willis-neurology, Imuran and prednisone  . Calculus of kidney   . Inguinal hernia without mention of obstruction or gangrene, unilateral or unspecified, (not specified as recurrent)     right   . Unspecified gastritis and gastroduodenitis without mention of hemorrhage   . Abdominal pain, epigastric   . Nonspecific elevation of levels of transaminase or lactic acid dehydrogenase (LDH)   . Feeding difficulties and mismanagement   . Edema of male genital organs     Resolved, occurred during illness related to myositis  . Swelling of arm     left.Marland KitchenMarland KitchenResolved.... no DVT  . LFT elevation     Related to Imuran in the past... dose was adjusted  .  Dyslipidemia     statins not used due to LFT abnormalities  . Shortness of breath     Breathing abnormalities related to muscle weakness from myositis  . RBBB (right bundle branch block)     Old  . Bradycardia     Sinus bradycardia, old  . Ejection fraction     EF 60%, echo, October, 2012  . Mitral regurgitation     Mild / moderate, echo, 2012  . Right ventricular dysfunction     Mild, echo, 2012  . Febrile illness     May, 2013, question sinusitis  . Polymyositis 11/26/2012    Medications:  Scheduled:  . albuterol  3 mL Inhalation QID  . aspirin  81 mg Oral Daily  . budesonide  0.5 mg Nebulization BID  . calcium-vitamin D  1 tablet Oral BID  .  citalopram  20 mg Oral Daily  . clindamycin (CLEOCIN) IV  900 mg Intravenous 3 times per day  . ferrous sulfate  325 mg Oral Q breakfast  . furosemide  40 mg Oral Daily  . montelukast  10 mg Oral QHS  . mycophenolate  500 mg Oral Daily  . pantoprazole  40 mg Oral Q1200  . piperacillin-tazobactam (ZOSYN)  IV  3.375 g Intravenous 3 times per day  . predniSONE  10 mg Oral Q breakfast  . temazepam  30 mg Oral QHS  . [START ON 11/22/2014] vancomycin  750 mg Intravenous Q18H   Assessment:  8/8: [facility] contacted to dose vancomycin in this 81yo M being treated for cellulitis. Patient was treated with vancomycin 1gm IV once on 8/6, and then started on clindamycin for outpatient treatment. He presented to the ED on 8/8 with apparent worsening of cellulitis and decision made to treat with vancomycin for possible MRSA infection unresponsive to clindamycin.   Vancomycin 1500mg  IV once loading dose given on 8/8  Patient receive vancomycin 1250 mg IV on 8/10 @ ~1330.   Problem started in left upper thigh in front. He has received Vancomycin as opt, had a Duplex study that showed no clots in deep veins.  Will dose based on PK parameters Kel (hr-1): 0.055 Half-life (hrs): 12.60 Vd (liters): 42.21 (factor used: 0.7 L/kg)  Goal of Therapy:  Vancomycin trough level 10-15 mcg/ml Vancomycin trough level 15-20 mcg/ml  Plan:   Will start Vancomycin 750 mg IV q12 hours @ 04:00 on 8/11.  Will order Vancomycin trough level prior to the 16:00 dose on 8/12.   8/12: Vanc trough @ 15:30 = 25 mcg/mL Will adjust dose to Vancomycin 750 mg IV Q18H to start 8/13 @ 9:00. Will draw next trough before 3rd new dose on 8/14 @ 20:30.    Follow up culture results  [name] [name] D 11/21/2014,4:52 PM

## 2014-11-21 NOTE — Plan of Care (Signed)
Problem: Discharge Progression Outcomes Goal: Other Discharge Outcomes/Goals Outcome: Progressing Plan of care progress to goal for:  1. Discharge Plan:         Plan to Discharge Home. 2. Pain:         Norco 1 tablet given for Left Leg Pain. Improvement Noted. 3. Hemodynamically Stable:         VSS.         Remains on IVF's.         Remains on IV Antibiotics.         Labs Improving.          4. Complications:         No signs/symptoms of complications noted.         CT Negative for Abscess. 5. Diet:         Regular Diet. Increased Appetite. Tolerating Well. 6. Activity:         Ambulating Independently. 7. Wound Improving/Decreased Edema:          Redness from groin to below knee. Scrotum to foot edematous.  Lower left leg swollen with +1           pitting edema which is an improvement over the +2 edema I saw on the previous night shift.

## 2014-11-21 NOTE — Plan of Care (Signed)
Problem: Discharge Progression Outcomes Goal: Discharge plan in place and appropriate Outcome: Progressing Individualization of Care Pt prefers to be called [name] [name] Hx of CLL, Atrial septal aneurysm, vertebral compression fractures, herpes zoster, aortic insufficiency, edema, myositis, SOB, RBBB. Admitted for cellulitis of the left leg.   Direct admit from [facility].   1. Discharge plan: ID following - Vanco, Zosyn, and Cleocin IV scheduled. CT negative for abcess.  Current plan to continue on IV antibiotics until redness and swelling resolves.   2. Pain: Patient cited pain from wound 6/10. Norco admitted per eMAR with pain resolved upon reassessment. 3. Hemodynamics: Patient afebrile and VSS this shift. BP 131/46 mmHg  Pulse 72  Temp(Src) 98.5 F (36.9 C) (Oral)  Resp 20  Ht 5\' 6"  (1.676 m)  Wt 133 lb (60.328 kg)  BMI 21.48 kg/m2  SpO2 98% 4. Diet: Patient on regular diet and tolerating well.  5. Activity: Patient up at bedside with urinal during the evening. Gait steady and tolerating well. 6. Wound Improvement: Redness from groin to below knee. Scrotum to foot edematous. Lower left leg swollen with +1 pitting edema which is an improvement over the +2 edema I saw on the previous night shift. ID and surgeon following.

## 2014-11-21 NOTE — Progress Notes (Signed)
[facility] PROGRESS NOTE Date of Admission:  11/19/2014     ID: [name] [name] is a 81 y.o. male with  Thigh cellulitis Active Problems:   Cellulitis   Subjective: Afebrile Continues to improve  ROS  Eleven systems are reviewed and negative except per hpi  Medications:  Antibiotics Given (last 72 hours)    Date/Time Action Medication Dose Rate   11/19/14 2019 Given   piperacillin-tazobactam (ZOSYN) IVPB 3.375 g 3.375 g 12.5 mL/hr   11/20/14 0001 Given   clindamycin (CLEOCIN) IVPB 900 mg 900 mg 100 mL/hr   11/20/14 0359 Given   vancomycin (VANCOCIN) IVPB 750 mg/150 ml premix 750 mg 150 mL/hr   11/20/14 0544 Given   piperacillin-tazobactam (ZOSYN) IVPB 3.375 g 3.375 g 12.5 mL/hr   11/20/14 0544 Given   clindamycin (CLEOCIN) IVPB 900 mg 900 mg 100 mL/hr   11/20/14 1410 Given   piperacillin-tazobactam (ZOSYN) IVPB 3.375 g 3.375 g 12.5 mL/hr   11/20/14 1410 Given   clindamycin (CLEOCIN) IVPB 900 mg 900 mg 100 mL/hr   11/20/14 1631 Given   vancomycin (VANCOCIN) IVPB 750 mg/150 ml premix 750 mg 150 mL/hr   11/20/14 2151 Given   clindamycin (CLEOCIN) IVPB 900 mg 900 mg 100 mL/hr   11/20/14 2234 Given   piperacillin-tazobactam (ZOSYN) IVPB 3.375 g 3.375 g 12.5 mL/hr   11/21/14 0346 Given   vancomycin (VANCOCIN) IVPB 750 mg/150 ml premix 750 mg 150 mL/hr   11/21/14 0519 Given   piperacillin-tazobactam (ZOSYN) IVPB 3.375 g 3.375 g 12.5 mL/hr   11/21/14 0519 Given   clindamycin (CLEOCIN) IVPB 900 mg 900 mg 100 mL/hr   11/21/14 1311 Given   piperacillin-tazobactam (ZOSYN) IVPB 3.375 g 3.375 g 12.5 mL/hr   11/21/14 1311 Given   clindamycin (CLEOCIN) IVPB 900 mg 900 mg 100 mL/hr     . albuterol  3 mL Inhalation QID  . aspirin  81 mg Oral Daily  . budesonide  0.5 mg Nebulization BID  . calcium-vitamin D  1 tablet Oral BID  . citalopram  20 mg Oral Daily  . clindamycin (CLEOCIN) IV  900 mg Intravenous 3 times per day  . ferrous sulfate  325 mg Oral Q  breakfast  . furosemide  40 mg Oral Daily  . montelukast  10 mg Oral QHS  . mycophenolate  500 mg Oral Daily  . pantoprazole  40 mg Oral Q1200  . piperacillin-tazobactam (ZOSYN)  IV  3.375 g Intravenous 3 times per day  . predniSONE  10 mg Oral Q breakfast  . temazepam  30 mg Oral QHS  . vancomycin  750 mg Intravenous Q12H    Objective: Vital signs in last 24 hours: Temp:  [98.3 F (36.8 C)-98.7 F (37.1 C)] 98.3 F (36.8 C) (08/12 1400) Pulse Rate:  [51-72] 68 (08/12 1400) Resp:  [18-20] 20 (08/12 1400) BP: (115-134)/(43-52) 124/52 mmHg (08/12 1400) SpO2:  [93 %-99 %] 97 % (08/12 1400) Constitutional: He is oriented to person, place, and time.frail HENT: Mouth/Throat: Oropharynx is clear and moist. No oropharyngeal exudate.  Cardiovascular: Normal rate, regular rhythm and normal heart sounds. Exam reveals no gallop and no friction rub.   Pulmonary/Chest: Effort normal and breath sounds normal. No respiratory distress. He has no wheezes.  Abdominal: Soft. Bowel sounds are normal. He exhibits no distension. There is no tenderness.  Lymphadenopathy: He has no cervical adenopathy.  Neurological: He is alert and oriented to person, place, and time.  Skin: R upper inner thigh with mid  induration and erythema. Does not cross inguinal ligament. Mild ttp.  Psychiatric: He has a normal mood and affect. His behavior is normal.   Lab Results  Recent Labs  11/19/14 1717 11/20/14 1630 11/21/14 0423  WBC  --  8.9 9.1  HGB  --  10.9* 10.1*  HCT  --  33.1* 30.5*  NA 135  --  137  K 4.7  --  4.1  CL 103  --  102  CO2 26  --  29  BUN 24*  --  16  CREATININE 0.72  --  0.80    Microbiology:  Studies/Results: Ct Pelvis W Contrast  11/19/2014   CLINICAL DATA:  Pain, swelling and erythema in the left groin and thigh for 1 week. No acute injury or prior relevant surgery. History of chronic lymphocytic leukemia.  EXAM: CT PELVIS AND BILATERAL LOWER EXTREMITIES WITH CONTRAST   TECHNIQUE: Multidetector CT imaging of the pelvis and bilateral lower extremities was performed according to the standard protocol following bolus administration of intravenous contrast. Multiplanar CT image reconstructions were also generated.  CONTRAST:  147mL OMNIPAQUE IOHEXOL 300 MG/ML  SOLN  COMPARISON:  Doppler ultrasound 11/15/2014. Abdominal CT 10/11/2014.  FINDINGS: CT PELVIS FINDINGS  No intrapelvic inflammatory changes or fluid collections demonstrated. There is moderate stool throughout the colon. The appendix appears normal.  There is aortoiliac atherosclerosis and moderate enlargement of the prostate gland. The bladder appears unremarkable. Bilateral scrotal hydroceles noted.  There are postsurgical changes in the left inguinal region without evidence of significant recurrent hernia.  The bones are demineralized without acute findings. 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 Patient is status post spinal augmentation at L4 and L5.  CT BILATERAL LOWER EXTREMITY FINDINGS  There is asymmetric enlargement of the left thigh with diffuse dermal thickening. There is asymmetric subcutaneous edema in the proximal thigh anteriorly and medially. More distally, this edema is more circumferential. No focal fluid collections are identified. Edema extends into the fascia within the posterior compartment. No intramuscular fluid collections identified.  There are probable reactive lymph nodes within the left groin. There is no significant hip joint effusion.  Diffuse vascular calcifications are noted throughout the femoral arteries. The left femoral vein is opacified and appears patent. There is limited contrast material within the right femoral vein. No DVT demonstrated.  There is no evidence of femoral bone destruction, acute fracture or dislocation.  IMPRESSION: 1. Findings are consistent with left thigh cellulitis and possible fasciitis. No focal fluid collection to suggest abscess. 2. No evidence of  osteomyelitis. 3. No significant intrapelvic inflammatory changes. 4. Diffuse atherosclerosis with asymmetric femoral vein enhancement (decreased on the right). This asymmetry may be secondary to hyperemia on the left. 5. Bilateral scrotal hydroceles.   Electronically Signed   By: Richardean Sale M.D.   On: 11/19/2014 18:59   Ct Extrem Lower W Cm Bil  11/19/2014   CLINICAL DATA:  Pain, swelling and erythema in the left groin and thigh for 1 week. No acute injury or prior relevant surgery. History of chronic lymphocytic leukemia.  EXAM: CT PELVIS AND BILATERAL LOWER EXTREMITIES WITH CONTRAST  TECHNIQUE: Multidetector CT imaging of the pelvis and bilateral lower extremities was performed according to the standard protocol following bolus administration of intravenous contrast. Multiplanar CT image reconstructions were also generated.  CONTRAST:  137mL OMNIPAQUE IOHEXOL 300 MG/ML  SOLN  COMPARISON:  Doppler ultrasound 11/15/2014. Abdominal CT 10/11/2014.  FINDINGS: CT PELVIS FINDINGS  No intrapelvic inflammatory changes or fluid collections demonstrated. There  is moderate stool throughout the colon. The appendix appears normal.  There is aortoiliac atherosclerosis and moderate enlargement of the prostate gland. The bladder appears unremarkable. Bilateral scrotal hydroceles noted.  There are postsurgical changes in the left inguinal region without evidence of significant recurrent hernia.  The bones are demineralized without acute findings. 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 Patient is status post spinal augmentation at L4 and L5.  CT BILATERAL LOWER EXTREMITY FINDINGS  There is asymmetric enlargement of the left thigh with diffuse dermal thickening. There is asymmetric subcutaneous edema in the proximal thigh anteriorly and medially. More distally, this edema is more circumferential. No focal fluid collections are identified. Edema extends into the fascia within the posterior compartment. No  intramuscular fluid collections identified.  There are probable reactive lymph nodes within the left groin. There is no significant hip joint effusion.  Diffuse vascular calcifications are noted throughout the femoral arteries. The left femoral vein is opacified and appears patent. There is limited contrast material within the right femoral vein. No DVT demonstrated.  There is no evidence of femoral bone destruction, acute fracture or dislocation.  IMPRESSION: 1. Findings are consistent with left thigh cellulitis and possible fasciitis. No focal fluid collection to suggest abscess. 2. No evidence of osteomyelitis. 3. No significant intrapelvic inflammatory changes. 4. Diffuse atherosclerosis with asymmetric femoral vein enhancement (decreased on the right). This asymmetry may be secondary to hyperemia on the left. 5. Bilateral scrotal hydroceles.   Electronically Signed   By: Richardean Sale M.D.   On: 11/19/2014 18:59    Assessment/Plan: [name] [name] is a 81 y.o. male with CLL, AIHA, s/p splenectomy, on steroids and rituximab admitted with R upper thigh cellulitis and possible fascitis. Clinically much improved with IV abx  Recommendations Continue IV vanco zosyn and clinda for another 2 days Can transition to orals on Monday if continues to improve- would dc on clinda and cipro if stable for another 5 days after Clinically follow. If worsens will need surgery  Thank you very much for the consult. Will follow with you.  [place], [place]   11/21/2014, 2:14 PM

## 2014-11-21 NOTE — Care Management (Signed)
Admitted to this facility with the diagnosis of cellulitis of lower legs. Lives alone. Daughter is Ladarrell Cornwall [phone]). Last seen in Dr. Otilio Miu office July 16th. No home health. No skilled facility. No home oxygen. Uses a cane as needed for ambulation. [facility] Alert in the home. Takes care of all activities of daily living himself. No falls, good appetite. One person assist to the bathroom. Daughter will transport. Shelbie Ammons RN MSN Care Management [phone]

## 2014-11-21 NOTE — Progress Notes (Signed)
[place] @ [facility] [phone]  [phone]     Otho Michalik OB: 1929/05/15  MR#: [id number]  CZY#:[id number]  Patient Care Team: Juline Patch, MD as PCP - General (Family Medicine) Kathrynn Ducking, MD (Neurology) Forest Gleason, MD as Consulting Physician (Unknown Physician Specialty) Carlena Bjornstad, MD as Consulting Physician (Cardiology) Seeplaputhur Robinette Haines, MD (General Surgery)  CHIEF COMPLAINT:  Cellulitis of left inguinal and lower extremity not responding to outpatient intravenous therapy.   Oncology History   Chief Complaint/Problem List  1.  Autoimmune hemolytic anemia. Previously received Prednisone taper.   2.  Splenectomy in 5/02.  3.  Gastric ulcer diagnosed on endoscopy and multiple biopsies are pending, November 2005. 4.  Chronic lymphocytic leukemia. 5.vitamin B 12 deficiency 6.  Recent admission in the hospital in June of 2016 with hemolytic anemia and hemoglobin of 5.5 patient received blood transfusion was started on steroid 7.  Patient was started on rituximab (October 20, 2014     Chronic lymphocytic leukemia   08/01/2008 Initial Diagnosis Chronic lymphocytic leukemia    Oncology Flowsheet 10/13/2014 10/20/2014 10/27/2014 11/03/2014 11/10/2014 11/20/2014 11/21/2014  cyanocobalamin ((VITAMIN B-12)) IM - - 1,000 mcg - 1,000 mcg - -  methylPREDNISolone sodium succinate 40 mg/mL (SOLU-MEDROL) IV - - - - - - -  predniSONE (DELTASONE) PO 80 mg - - - - 10 mg 10 mg  riTUXimab (RITUXAN) IV - 375 mg/m2 375 mg/m2 375 mg/m2 375 mg/m2 - -  zolendronic acid (ZOMETA) IV - - - - - - -    INTERVAL HISTORY:  81 year old gentleman was admitted in the hospital with cellulitis of the groin and left lower extremity. Erythema associated with cellulitis has improved. Site remains warm to touch and painful but overall patient reports improvement. Patient was seen by surgical service as well as by infectious disease specialist. On multiple antibiotics including Zosyn,  vancomycin and clindamycin and is responding very well to treatment.  REVIEW OF SYSTEMS:   General status: Patient is feeling weak but greatly improved. No change in  performance status.  No chills.  No fever. HEENT:  No evidence of stomatitis Lungs: No cough or shortness of breath Cardiac: No chest pain or paroxysmal nocturnal dyspnea GI: No nausea no vomiting no diarrhea no abdominal pain Skin:Cellulitis of Lower extremity redness and tenderness Neurological system: No tingling.  No numbness.  No other focal signs Musculoskeletal system no bony pains  As per HPI. Otherwise, a complete review of systems is negatve.  PAST MEDICAL HISTORY: Past Medical History  Diagnosis Date  . CLL (chronic lymphoblastic leukemia) 2006    Rituxan treatment 2006  . Atrial septal aneurysm 2009    Insignificant, no, January, 2009  . Infection of PEG site     cellulitis.Marland KitchenResolved  . Vertebral compression fracture     multiple levels  . Herpes zoster     right chest  . Aortic insufficiency 2009    mild, Mild, echo, 2012  . Diastolic dysfunction     mild  . Chest pain     EF 55%, echo, October, 2009, possible inferior and posterior borderline hypokinesis, patient has never  had cardiac catheterization  . Edema     EF 55%, echo, October, 2009, possible borderline inferior and posterior hypokinesis... heart catheterization has never been done(July 10, 2009)  . Myositis     Caused pulmonary insufficiency with muscle weakness in the past / Dr.Willis-neurology, Imuran and prednisone  . Calculus of kidney   . Inguinal hernia without  mention of obstruction or gangrene, unilateral or unspecified, (not specified as recurrent)     right   . Unspecified gastritis and gastroduodenitis without mention of hemorrhage   . Abdominal pain, epigastric   . Nonspecific elevation of levels of transaminase or lactic acid dehydrogenase (LDH)   . Feeding difficulties and mismanagement   . Edema of male genital organs       Resolved, occurred during illness related to myositis  . Swelling of arm     left.Marland KitchenMarland KitchenResolved.... no DVT  . LFT elevation     Related to Imuran in the past... dose was adjusted  . Dyslipidemia     statins not used due to LFT abnormalities  . Shortness of breath     Breathing abnormalities related to muscle weakness from myositis  . RBBB (right bundle branch block)     Old  . Bradycardia     Sinus bradycardia, old  . Ejection fraction     EF 60%, echo, October, 2012  . Mitral regurgitation     Mild / moderate, echo, 2012  . Right ventricular dysfunction     Mild, echo, 2012  . Febrile illness     May, 2013, question sinusitis  . Polymyositis 11/26/2012    PAST SURGICAL HISTORY: Past Surgical History  Procedure Laterality Date  . Splenectomy    . Hernia repair      left side  . Peg placement  06/2007  . Peg tube removal  12/2007  . Kyphosis surgery      multilevel vertebral  . Odontoid fracture surgery      anterior screw fixation  . Nasal sinus surgery      x2  . Cataract extraction      bi lateral    FAMILY HISTORY Family History  Problem Relation Age of Onset  . Breast cancer Sister   . Colon cancer Brother   . Prostate cancer Brother   . Stroke Mother   . Hypertension Mother   . Emphysema Father   . Diabetes Grandchild   . Heart attack Neg Hx   . Cancer Brother   . Cancer Sister   . Cancer Daughter     ADVANCED DIRECTIVES:  Patient does have advance healthcare directive, Patient   does not desire to make any changes  HEALTH MAINTENANCE: Social History  Substance Use Topics  . Smoking status: Former Smoker -- 0.50 packs/day for 31 years    Types: Cigarettes    Quit date: 06/29/1968  . Smokeless tobacco: Former Systems developer    Types: Chew  . Alcohol Use: No     Comment: rarely      Allergies  Allergen Reactions  . Sulfonamide Derivatives       OBJECTIVE:  Filed Vitals:   11/21/14 1007  BP: 115/48  Pulse: 57  Temp: 98.6 F (37 C)  Resp:  18     Body mass index is 21.48 kg/(m^2).    ECOG FS:1 - Symptomatic but completely ambulatory  PHYSICAL EXAM: Gen. status: Patient is alert oriented not any acute distress Lymphatic system: Supraclavicular, cervical, axillary, inguinal lymph nodes are not palpable Head exam was generally normal. There was no scleral icterus or corneal arcus. Mucous membranes were moist. Cardiac: Normal rate and rhythm Abdominal: exam revealed normal bowel sounds. The abdomen was soft, non-tender, and without masses, organomegaly, or appreciable enlargement of the abdominal aorta. Examination of the skin revealed no evidence of significant rashes, suspicious appearing nevi or other concerning lesions.There is significant improvement  in redness and pain. Neurologically, the patient was awake, alert, and oriented to person, place and time. There were no obvious focal neurologic abnormalities.  Kyphoscoliosis: Osteoporosis and multiple vertebral compression fracture   LAB RESULTS:  Admission on 11/19/2014  Component Date Value Ref Range Status  . Sodium 11/19/2014 135  135 - 145 mmol/L Final  . Potassium 11/19/2014 4.7  3.5 - 5.1 mmol/L Final  . Chloride 11/19/2014 103  101 - 111 mmol/L Final  . CO2 11/19/2014 26  22 - 32 mmol/L Final  . Glucose, Bld 11/19/2014 91  65 - 99 mg/dL Final  . BUN 11/19/2014 24* 6 - 20 mg/dL Final  . Creatinine, Ser 11/19/2014 0.72  0.61 - 1.24 mg/dL Final  . Calcium 11/19/2014 8.0* 8.9 - 10.3 mg/dL Final  . GFR calc non Af Amer 11/19/2014 >60  >60 mL/min Final  . GFR calc Af Amer 11/19/2014 >60  >60 mL/min Final   Comment: (NOTE) The eGFR has been calculated using the CKD EPI equation. This calculation has not been validated in all clinical situations. eGFR's persistently <60 mL/min signify possible Chronic Kidney Disease.   . Anion gap 11/19/2014 6  5 - 15 Final  . WBC 11/20/2014 8.9  3.8 - 10.6 K/uL Final  . RBC 11/20/2014 3.01* 4.40 - 5.90 MIL/uL Final  . Hemoglobin  11/20/2014 10.9* 13.0 - 18.0 g/dL Final  . HCT 11/20/2014 33.1* 40.0 - 52.0 % Final  . MCV 11/20/2014 109.7* 80.0 - 100.0 fL Final  . MCH 11/20/2014 36.3* 26.0 - 34.0 pg Final  . MCHC 11/20/2014 33.1  32.0 - 36.0 g/dL Final  . RDW 11/20/2014 16.1* 11.5 - 14.5 % Final  . Platelets 11/20/2014 331  150 - 440 K/uL Final  . Neutrophils Relative % 11/20/2014 75   Final  . Neutro Abs 11/20/2014 6.7* 1.4 - 6.5 K/uL Final  . Lymphocytes Relative 11/20/2014 13   Final  . Lymphs Abs 11/20/2014 1.1  1.0 - 3.6 K/uL Final  . Monocytes Relative 11/20/2014 12   Final  . Monocytes Absolute 11/20/2014 1.1* 0.2 - 1.0 K/uL Final  . Eosinophils Relative 11/20/2014 0   Final  . Eosinophils Absolute 11/20/2014 0.0  0 - 0.7 K/uL Final  . Basophils Relative 11/20/2014 0   Final  . Basophils Absolute 11/20/2014 0.0  0 - 0.1 K/uL Final  . WBC 11/21/2014 9.1  3.8 - 10.6 K/uL Final  . RBC 11/21/2014 2.78* 4.40 - 5.90 MIL/uL Final  . Hemoglobin 11/21/2014 10.1* 13.0 - 18.0 g/dL Final  . HCT 11/21/2014 30.5* 40.0 - 52.0 % Final  . MCV 11/21/2014 110.0* 80.0 - 100.0 fL Final  . MCH 11/21/2014 36.4* 26.0 - 34.0 pg Final  . MCHC 11/21/2014 33.1  32.0 - 36.0 g/dL Final  . RDW 11/21/2014 16.0* 11.5 - 14.5 % Final  . Platelets 11/21/2014 320  150 - 440 K/uL Final  . Neutrophils Relative % 11/21/2014 43   Final  . Neutro Abs 11/21/2014 3.9  1.4 - 6.5 K/uL Final  . Lymphocytes Relative 11/21/2014 42   Final  . Lymphs Abs 11/21/2014 3.9* 1.0 - 3.6 K/uL Final  . Monocytes Relative 11/21/2014 14   Final  . Monocytes Absolute 11/21/2014 1.2* 0.2 - 1.0 K/uL Final  . Eosinophils Relative 11/21/2014 0   Final  . Eosinophils Absolute 11/21/2014 0.0  0 - 0.7 K/uL Final  . Basophils Relative 11/21/2014 1   Final  . Basophils Absolute 11/21/2014 0.1  0 - 0.1 K/uL Final  .  Sodium 11/21/2014 137  135 - 145 mmol/L Final  . Potassium 11/21/2014 4.1  3.5 - 5.1 mmol/L Final  . Chloride 11/21/2014 102  101 - 111 mmol/L Final  . CO2  11/21/2014 29  22 - 32 mmol/L Final  . Glucose, Bld 11/21/2014 116* 65 - 99 mg/dL Final  . BUN 11/21/2014 16  6 - 20 mg/dL Final  . Creatinine, Ser 11/21/2014 0.80  0.61 - 1.24 mg/dL Final  . Calcium 11/21/2014 8.6* 8.9 - 10.3 mg/dL Final  . Total Protein 11/21/2014 5.2* 6.5 - 8.1 g/dL Final  . Albumin 11/21/2014 2.5* 3.5 - 5.0 g/dL Final  . AST 11/21/2014 32  15 - 41 U/L Final  . ALT 11/21/2014 28  17 - 63 U/L Final  . Alkaline Phosphatase 11/21/2014 62  38 - 126 U/L Final  . Total Bilirubin 11/21/2014 0.9  0.3 - 1.2 mg/dL Final  . GFR calc non Af Amer 11/21/2014 >60  >60 mL/min Final  . GFR calc Af Amer 11/21/2014 >60  >60 mL/min Final   Comment: (NOTE) The eGFR has been calculated using the CKD EPI equation. This calculation has not been validated in all clinical situations. eGFR's persistently <60 mL/min signify possible Chronic Kidney Disease.   . Anion gap 11/21/2014 6  5 - 15 Final      STUDIES: Ct Pelvis W Contrast  11/19/2014   CLINICAL DATA:  Pain, swelling and erythema in the left groin and thigh for 1 week. No acute injury or prior relevant surgery. History of chronic lymphocytic leukemia.  EXAM: CT PELVIS AND BILATERAL LOWER EXTREMITIES WITH CONTRAST  TECHNIQUE: Multidetector CT imaging of the pelvis and bilateral lower extremities was performed according to the standard protocol following bolus administration of intravenous contrast. Multiplanar CT image reconstructions were also generated.  CONTRAST:  148m OMNIPAQUE IOHEXOL 300 MG/ML  SOLN  COMPARISON:  Doppler ultrasound 11/15/2014. Abdominal CT 10/11/2014.  FINDINGS: CT PELVIS FINDINGS  No intrapelvic inflammatory changes or fluid collections demonstrated. There is moderate stool throughout the colon. The appendix appears normal.  There is aortoiliac atherosclerosis and moderate enlargement of the prostate gland. The bladder appears unremarkable. Bilateral scrotal hydroceles noted.  There are postsurgical changes in the  left inguinal region without evidence of significant recurrent hernia.  The bones are demineralized without acute findings. 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 Patient is status post spinal augmentation at L4 and L5.  CT BILATERAL LOWER EXTREMITY FINDINGS  There is asymmetric enlargement of the left thigh with diffuse dermal thickening. There is asymmetric subcutaneous edema in the proximal thigh anteriorly and medially. More distally, this edema is more circumferential. No focal fluid collections are identified. Edema extends into the fascia within the posterior compartment. No intramuscular fluid collections identified.  There are probable reactive lymph nodes within the left groin. There is no significant hip joint effusion.  Diffuse vascular calcifications are noted throughout the femoral arteries. The left femoral vein is opacified and appears patent. There is limited contrast material within the right femoral vein. No DVT demonstrated.  There is no evidence of femoral bone destruction, acute fracture or dislocation.  IMPRESSION: 1. Findings are consistent with left thigh cellulitis and possible fasciitis. No focal fluid collection to suggest abscess. 2. No evidence of osteomyelitis. 3. No significant intrapelvic inflammatory changes. 4. Diffuse atherosclerosis with asymmetric femoral vein enhancement (decreased on the right). This asymmetry may be secondary to hyperemia on the left. 5. Bilateral scrotal hydroceles.   Electronically Signed   By: WGwyndolyn Saxon  Lin Landsman M.D.   On: 11/19/2014 18:59   US Venous Img Lower Unilateral Left  11/15/2014   CLINICAL DATA:  Left lower extremity edema and erythema for 4 days  EXAM: LEFT LOWER EXTREMITY VENOUS DOPPLER ULTRASOUND  TECHNIQUE: Gray-scale sonography with graded compression, as well as color Doppler and duplex ultrasound were performed to evaluate the lower extremity deep venous systems from the level of the common femoral vein and including the common  femoral, femoral, profunda femoral, popliteal and calf veins including the posterior tibial, peroneal and gastrocnemius veins when visible. The superficial great saphenous vein was also interrogated. Spectral Doppler was utilized to evaluate flow at rest and with distal augmentation maneuvers in the common femoral, femoral and popliteal veins.  COMPARISON:  None.  FINDINGS: Contralateral Common Femoral Vein: Respiratory phasicity is normal and symmetric with the symptomatic side. No evidence of thrombus. Normal compressibility.  Common Femoral Vein: No evidence of thrombus. Normal compressibility, respiratory phasicity and response to augmentation.  Saphenofemoral Junction: No evidence of thrombus. Normal compressibility and flow on color Doppler imaging.  Profunda Femoral Vein: No evidence of thrombus. Normal compressibility and flow on color Doppler imaging.  Femoral Vein: No evidence of thrombus. Normal compressibility, respiratory phasicity and response to augmentation.  Popliteal Vein: No evidence of thrombus. Normal compressibility, respiratory phasicity and response to augmentation.  Calf Veins: No evidence of thrombus. Normal compressibility and flow on color Doppler imaging.  Superficial Great Saphenous Vein: No evidence of thrombus. Normal compressibility and flow on color Doppler imaging.  Venous Reflux:  None.  Other Findings: Multiple prominent left inguinal lymph nodes the largest measuring 21 mm in greatest diameter. These demonstrate fatty hila with no significant cortical thickening and are presumably reactive.  IMPRESSION: No evidence of deep venous thrombosis.   Electronically Signed   By: Skipper Cliche M.D.   On: 11/15/2014 12:34   Ct Extrem Lower W Cm Bil  11/19/2014   CLINICAL DATA:  Pain, swelling and erythema in the left groin and thigh for 1 week. No acute injury or prior relevant surgery. History of chronic lymphocytic leukemia.  EXAM: CT PELVIS AND BILATERAL LOWER EXTREMITIES WITH  CONTRAST  TECHNIQUE: Multidetector CT imaging of the pelvis and bilateral lower extremities was performed according to the standard protocol following bolus administration of intravenous contrast. Multiplanar CT image reconstructions were also generated.  CONTRAST:  157m OMNIPAQUE IOHEXOL 300 MG/ML  SOLN  COMPARISON:  Doppler ultrasound 11/15/2014. Abdominal CT 10/11/2014.  FINDINGS: CT PELVIS FINDINGS  No intrapelvic inflammatory changes or fluid collections demonstrated. There is moderate stool throughout the colon. The appendix appears normal.  There is aortoiliac atherosclerosis and moderate enlargement of the prostate gland. The bladder appears unremarkable. Bilateral scrotal hydroceles noted.  There are postsurgical changes in the left inguinal region without evidence of significant recurrent hernia.  The bones are demineralized without acute findings. 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 Patient is status post spinal augmentation at L4 and L5.  CT BILATERAL LOWER EXTREMITY FINDINGS  There is asymmetric enlargement of the left thigh with diffuse dermal thickening. There is asymmetric subcutaneous edema in the proximal thigh anteriorly and medially. More distally, this edema is more circumferential. No focal fluid collections are identified. Edema extends into the fascia within the posterior compartment. No intramuscular fluid collections identified.  There are probable reactive lymph nodes within the left groin. There is no significant hip joint effusion.  Diffuse vascular calcifications are noted throughout the femoral arteries. The left femoral vein is opacified and appears  patent. There is limited contrast material within the right femoral vein. No DVT demonstrated.  There is no evidence of femoral bone destruction, acute fracture or dislocation.  IMPRESSION: 1. Findings are consistent with left thigh cellulitis and possible fasciitis. No focal fluid collection to suggest abscess. 2. No  evidence of osteomyelitis. 3. No significant intrapelvic inflammatory changes. 4. Diffuse atherosclerosis with asymmetric femoral vein enhancement (decreased on the right). This asymmetry may be secondary to hyperemia on the left. 5. Bilateral scrotal hydroceles.   Electronically Signed   By: Richardean Sale M.D.   On: 11/19/2014 18:59    ASSESSMENT: Autoimmune hemolytic anemia with lymphoproliferative disease He  is on prednisone 10 mg.  Patient is somewhat immunocompromised because of prednisone as well as rituximab therapy. Cellulitis of lower extremity CT scan has been consistent with fasciitis.  No evidence of enlarged lymph node or abscess  MEDICAL DECISION MAKING:   1. Cellulitis of lower extremity.  CT scan of the lower extremity as well as pelvis has been reviewed with no signs of abscess.  There is significant improvement in redness.   Dr. Oliva Bustard previously discussed situation with infectious disease specialist as well as with surgeon will continue antibiotic for several days until redness and pain completely resolves. VTE prophylaxis has been ordered. Early ambulation has been ordered.  Dr. Grayland Ormond was available for consultation and review of plan of care for this patient.  No matching staging information was found for the patient.  Evlyn Kanner, NP   11/21/2014 11:04 AM

## 2014-11-21 NOTE — Care Management Important Message (Signed)
Important Message  Patient Details  Name: [name] [name] MRN: [id number] Date of Birth: Apr 08, 1930   Medicare Important Message Given:  Yes-second notification given    Juliann Pulse A Allmond 11/21/2014, 10:38 AM

## 2014-11-22 ENCOUNTER — Inpatient Hospital Stay: Payer: Medicare Other

## 2014-11-22 NOTE — Plan of Care (Signed)
Problem: Discharge Progression Outcomes Goal: Other Discharge Outcomes/Goals Outcome: Progressing Plan of care progress to goal for:  1. Discharge Plan:         Plan to Discharge Home. 2. Pain:         Norco 1 tablet given for Left Leg Pain. Improvement Noted. 3. Hemodynamically Stable:         VSS.         Remains on IVF's.         Remains on IV Antibiotics.         Labs Improving.           4. Complications:         U/S LLE.         5. Diet:         Regular Diet. Increased Appetite. Tolerating Well. 6. Activity:         Ambulating Independently. 7. Wound Improving/Decreased Edema:          Redness from groin to below knee. Scrotum to foot edematous. Lower left leg swollen with +1            pitting edema which is an improvement over the +2 edema I saw on the previous night shift.

## 2014-11-22 NOTE — Plan of Care (Signed)
Problem: Discharge Progression Outcomes Goal: Other Discharge Outcomes/Goals Plan of care progress to goal for:  1. Discharge Plan:         Plan to Discharge Home on Monday. 2. Pain:         Norco 1 tablet given for Left Leg Pain. Improvement Noted. 3. Hemodynamically Stable:         VSS.         Remains on IVF's.         Remains on IV Antibiotics.               5. Diet:         Regular Diet. Increased Appetite. Tolerating Well. 6. Activity:         Ambulating Independently. 7. Wound Improving/Decreased Edema:         Improved redness and edema

## 2014-11-22 NOTE — Progress Notes (Signed)
CHIEF COMPLAINT:  Cellulitis of left inguinal and lower extremity not responding to outpatient intravenous therapy.   Oncology History   Chief Complaint/Problem List  1. Autoimmune hemolytic anemia. Previously received Prednisone taper.  2. Splenectomy in 5/02.  3. Gastric ulcer diagnosed on endoscopy and multiple biopsies are pending, November 2005. 4. Chronic lymphocytic leukemia. 5.vitamin B 12 deficiency 6. Recent admission in the hospital in June of 2016 with hemolytic anemia and hemoglobin of 5.5 patient received blood transfusion was started on steroid 7. Patient was started on rituximab (October 20, 2014       History of present illness:  Patient is resting in bed, states that he is still weak but this is chronic fatigue and is unchanged. He denies any fevers or chills overnight. States that the redness in his left thigh and groin area is less prominent but that is still inflamed area in the medial left groin, states that overall pain is also doing better. Patient was seen by Dr.Sankar today who plans to ultrasound left groin to rule out localizing pus or fluid. On multiple antibiotics including Zosyn, vancomycin and clindamycin. No nausea or vomiting. No diarrhea. No bleeding symptoms. No new dyspnea at rest, chest pain, orthopnea or PND.  REVIEW OF SYSTEMS:  General status: Chronic fatigue with exertion is seen. No new fevers or chills.  HEENT: Denies epistaxis, ear or jaw pain. No mouth sores Lungs: No cough or shortness of breath Cardiac: No chest pain or paroxysmal nocturnal dyspnea GI: No nausea no vomiting no diarrhea no abdominal pain Skin: Cellulitis of Lower extremity redness and tenderness Neurological system: No tingling. No numbness. No focal weakness  PAST MEDICAL HISTORY: Past Medical History  Diagnosis Date  . CLL (chronic lymphoblastic leukemia) 2006    Rituxan treatment 2006  . Atrial septal aneurysm 2009    Insignificant,  no, January, 2009  . Infection of PEG site     cellulitis.Marland KitchenResolved  . Vertebral compression fracture     multiple levels  . Herpes zoster     right chest  . Aortic insufficiency 2009    mild, Mild, echo, 2012  . Diastolic dysfunction     mild  . Chest pain     EF 55%, echo, October, 2009, possible inferior and posterior borderline hypokinesis, patient has never had cardiac catheterization  . Edema     EF 55%, echo, October, 2009, possible borderline inferior and posterior hypokinesis... heart catheterization has never been done(July 10, 2009)  . Myositis     Caused pulmonary insufficiency with muscle weakness in the past / Dr.Willis-neurology, Imuran and prednisone  . Calculus of kidney   . Inguinal hernia without mention of obstruction or gangrene, unilateral or unspecified, (not specified as recurrent)     right   . Unspecified gastritis and gastroduodenitis without mention of hemorrhage   . Abdominal pain, epigastric   . Nonspecific elevation of levels of transaminase or lactic acid dehydrogenase (LDH)   . Feeding difficulties and mismanagement   . Edema of male genital organs     Resolved, occurred during illness related to myositis  . Swelling of arm     left.Marland KitchenMarland KitchenResolved.... no DVT  . LFT elevation     Related to Imuran in the past... dose was adjusted  . Dyslipidemia     statins not used due to LFT abnormalities  . Shortness of breath     Breathing abnormalities related to muscle weakness from myositis  . RBBB (right bundle branch block)  Old  . Bradycardia     Sinus bradycardia, old  . Ejection fraction     EF 60%, echo, October, 2012  . Mitral regurgitation     Mild / moderate, echo, 2012  . Right ventricular dysfunction     Mild, echo, 2012  . Febrile illness     May, 2013, question sinusitis  . Polymyositis 11/26/2012     PAST SURGICAL HISTORY: Past Surgical History  Procedure Laterality Date  . Splenectomy    . Hernia repair      left side  . Peg placement  06/2007  . Peg tube removal  12/2007  . Kyphosis surgery      multilevel vertebral  . Odontoid fracture surgery      anterior screw fixation  . Nasal sinus surgery      x2  . Cataract extraction      bi lateral    FAMILY HISTORY Family History  Problem Relation Age of Onset  . Breast cancer Sister   . Colon cancer Brother   . Prostate cancer Brother   . Stroke Mother   . Hypertension Mother   . Emphysema Father   . Diabetes Grandchild   . Heart attack Neg Hx   . Cancer Brother   . Cancer Sister   . Cancer Daughter     ADVANCED DIRECTIVES:  Patient does have advance healthcare directive, Patient does not desire to make any changes  HEALTH MAINTENANCE: Social History  Substance Use Topics  . Smoking status: Former Smoker -- 0.50 packs/day for 31 years    Types: Cigarettes    Quit date: 06/29/1968  . Smokeless tobacco: Former Systems developer    Types: Chew  . Alcohol Use: No     Comment: rarely     Allergies  Allergen Reactions  . Sulfonamide Derivatives       OBJECTIVE:  Filed Vitals:   11/21/14 1007  BP: 115/48  Pulse: 57  Temp: 98.6 F (37 C)  Resp: 18   Body mass index is 21.48 kg/(m^2). ECOG FS:1 - Symptomatic but completely ambulatory  PHYSICAL EXAM: GENERAL: Patient is alert and oriented and in no acute distress. There is no icterus. HEENT: Extraocular movements intact. Oral exam negative for any thrush or erythema.   CVS: S1, S2 heard with regular rate and rhythm.   LUNGS: Bilaterally clear to auscultation. No crepitations. ABDOMEN: Soft, nontender.   EXTREMITIES: No right pedal edema. Has generalized mild erythema in the left lower extremity,  along with markedly edematous/inflammatory area in the medial left groin which is mildly tender. No discharge or bleeding. NEURO: Cranial nerves intact, grossly nonfocal   LAB RESULTS:  Admission on 11/19/2014  Component Date Value Ref Range Status  . Sodium 11/19/2014 135  135 - 145 mmol/L Final  . Potassium 11/19/2014 4.7  3.5 - 5.1 mmol/L Final  . Chloride 11/19/2014 103  101 - 111 mmol/L Final  . CO2 11/19/2014 26  22 - 32 mmol/L Final  . Glucose, Bld 11/19/2014 91  65 - 99 mg/dL Final  . BUN 11/19/2014 24* 6 - 20 mg/dL Final  . Creatinine, Ser 11/19/2014 0.72  0.61 - 1.24 mg/dL Final  . Calcium 11/19/2014 8.0* 8.9 - 10.3 mg/dL Final  . GFR calc non Af Amer 11/19/2014 >60  >60 mL/min Final  . GFR calc Af Amer 11/19/2014 >60  >60 mL/min Final   Comment: (NOTE) The eGFR has been calculated using the CKD EPI equation. This calculation has not been validated in  all clinical situations. eGFR's persistently <60 mL/min signify possible Chronic Kidney Disease.   . Anion gap 11/19/2014 6  5 - 15 Final  . WBC 11/20/2014 8.9  3.8 - 10.6 K/uL Final  . RBC 11/20/2014 3.01* 4.40 - 5.90 MIL/uL Final  . Hemoglobin 11/20/2014 10.9* 13.0 - 18.0 g/dL Final  . HCT 11/20/2014 33.1* 40.0 - 52.0 % Final  . MCV 11/20/2014 109.7* 80.0 - 100.0 fL Final  . MCH 11/20/2014 36.3* 26.0 - 34.0 pg Final  . MCHC 11/20/2014 33.1  32.0 - 36.0 g/dL Final  . RDW 11/20/2014 16.1* 11.5 - 14.5 % Final  . Platelets 11/20/2014 331  150 - 440 K/uL Final  . Neutrophils Relative % 11/20/2014 75   Final  . Neutro Abs 11/20/2014 6.7* 1.4 - 6.5 K/uL Final  . Lymphocytes Relative 11/20/2014 13   Final  . Lymphs Abs 11/20/2014 1.1  1.0 - 3.6 K/uL Final  . Monocytes Relative 11/20/2014 12   Final  . Monocytes Absolute 11/20/2014 1.1* 0.2 - 1.0 K/uL Final  .  Eosinophils Relative 11/20/2014 0   Final  . Eosinophils Absolute 11/20/2014 0.0  0 - 0.7 K/uL Final  . Basophils Relative 11/20/2014 0   Final  . Basophils Absolute 11/20/2014 0.0  0 - 0.1 K/uL Final  . WBC 11/21/2014 9.1  3.8 - 10.6 K/uL Final  . RBC 11/21/2014 2.78* 4.40 - 5.90 MIL/uL Final  . Hemoglobin 11/21/2014 10.1* 13.0 - 18.0 g/dL Final  . HCT 11/21/2014 30.5* 40.0 - 52.0 % Final  . MCV 11/21/2014 110.0* 80.0 - 100.0 fL Final  . MCH 11/21/2014 36.4* 26.0 - 34.0 pg Final  . MCHC 11/21/2014 33.1  32.0 - 36.0 g/dL Final  . RDW 11/21/2014 16.0* 11.5 - 14.5 % Final  . Platelets 11/21/2014 320  150 - 440 K/uL Final  . Neutrophils Relative % 11/21/2014 43   Final  . Neutro Abs 11/21/2014 3.9  1.4 - 6.5 K/uL Final  . Lymphocytes Relative 11/21/2014 42   Final  . Lymphs Abs 11/21/2014 3.9* 1.0 - 3.6 K/uL Final  . Monocytes Relative 11/21/2014 14   Final  . Monocytes Absolute 11/21/2014 1.2* 0.2 - 1.0 K/uL Final  . Eosinophils Relative 11/21/2014 0   Final  . Eosinophils Absolute 11/21/2014 0.0  0 - 0.7 K/uL Final  . Basophils Relative 11/21/2014 1   Final  . Basophils Absolute 11/21/2014 0.1  0 - 0.1 K/uL Final  . Sodium 11/21/2014 137  135 - 145 mmol/L Final  . Potassium 11/21/2014 4.1  3.5 - 5.1 mmol/L Final  . Chloride 11/21/2014 102  101 - 111 mmol/L Final  . CO2 11/21/2014 29  22 - 32 mmol/L Final  . Glucose, Bld 11/21/2014 116* 65 - 99 mg/dL Final  . BUN 11/21/2014 16  6 - 20 mg/dL Final  . Creatinine, Ser 11/21/2014 0.80  0.61 - 1.24 mg/dL Final  . Calcium 11/21/2014 8.6* 8.9 - 10.3 mg/dL Final  . Total Protein 11/21/2014 5.2* 6.5 - 8.1 g/dL Final  . Albumin 11/21/2014 2.5* 3.5 - 5.0 g/dL Final  . AST 11/21/2014 32  15 - 41 U/L Final  . ALT 11/21/2014 28  17 - 63 U/L  Final  . Alkaline Phosphatase 11/21/2014 62  38 - 126 U/L Final  . Total Bilirubin 11/21/2014 0.9  0.3 - 1.2 mg/dL Final  . GFR calc non Af Amer 11/21/2014 >60  >60 mL/min Final  . GFR calc Af Amer 11/21/2014 >60  >60 mL/min Final  Comment: (NOTE) The eGFR has been calculated using the CKD EPI equation. This calculation has not been validated in all clinical situations. eGFR's persistently <60 mL/min signify possible Chronic Kidney Disease.   . Anion gap 11/21/2014 6  5 - 15 Final      STUDIES:  Imaging Results    Ct Pelvis W Contrast  11/19/2014 CLINICAL DATA: Pain, swelling and erythema in the left groin and thigh for 1 week. No acute injury or prior relevant surgery. History of chronic lymphocytic leukemia. EXAM: CT PELVIS AND BILATERAL LOWER EXTREMITIES WITH CONTRAST TECHNIQUE: Multidetector CT imaging of the pelvis and bilateral lower extremities was performed according to the standard protocol following bolus administration of intravenous contrast. Multiplanar CT image reconstructions were also generated. CONTRAST: 14m OMNIPAQUE IOHEXOL 300 MG/ML SOLN COMPARISON: Doppler ultrasound 11/15/2014. Abdominal CT 10/11/2014. FINDINGS: CT PELVIS FINDINGS No intrapelvic inflammatory changes or fluid collections demonstrated. There is moderate stool throughout the colon. The appendix appears normal. There is aortoiliac atherosclerosis and moderate enlargement of the prostate gland. The bladder appears unremarkable. Bilateral scrotal hydroceles noted. There are postsurgical changes in the left inguinal region without evidence of significant recurrent hernia. The bones are demineralized without acute findings. 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 Patient is status post spinal augmentation at L4 and L5. CT BILATERAL LOWER EXTREMITY FINDINGS There is asymmetric enlargement of the left thigh with diffuse dermal thickening. There is asymmetric  subcutaneous edema in the proximal thigh anteriorly and medially. More distally, this edema is more circumferential. No focal fluid collections are identified. Edema extends into the fascia within the posterior compartment. No intramuscular fluid collections identified. There are probable reactive lymph nodes within the left groin. There is no significant hip joint effusion. Diffuse vascular calcifications are noted throughout the femoral arteries. The left femoral vein is opacified and appears patent. There is limited contrast material within the right femoral vein. No DVT demonstrated. There is no evidence of femoral bone destruction, acute fracture or dislocation. IMPRESSION: 1. Findings are consistent with left thigh cellulitis and possible fasciitis. No focal fluid collection to suggest abscess. 2. No evidence of osteomyelitis. 3. No significant intrapelvic inflammatory changes. 4. Diffuse atherosclerosis with asymmetric femoral vein enhancement (decreased on the right). This asymmetry may be secondary to hyperemia on the left. 5. Bilateral scrotal hydroceles. Electronically Signed By: WRichardean SaleM.D. On: 11/19/2014 18:59   [place] Im[place] Lower Unilateral Left  11/15/2014 CLINICAL DATA: Left lower extremity edema and erythema for 4 days EXAM: LEFT LOWER EXTREMITY VENOUS DOPPLER ULTRASOUND TECHNIQUE: Gray-scale sonography with graded compression, as well as color Doppler and duplex ultrasound were performed to evaluate the lower extremity deep venous systems from the level of the common femoral vein and including the common femoral, femoral, profunda femoral, popliteal and calf veins including the posterior tibial, peroneal and gastrocnemius veins when visible. The superficial great saphenous vein was also interrogated. Spectral Doppler was utilized to evaluate flow at rest and with distal augmentation maneuvers in the common femoral, femoral and popliteal veins. COMPARISON: None.  FINDINGS: Contralateral Common Femoral Vein: Respiratory phasicity is normal and symmetric with the symptomatic side. No evidence of thrombus. Normal compressibility. Common Femoral Vein: No evidence of thrombus. Normal compressibility, respiratory phasicity and response to augmentation. Saphenofemoral Junction: No evidence of thrombus. Normal compressibility and flow on color Doppler imaging. Profunda Femoral Vein: No evidence of thrombus. Normal compressibility and flow on color Doppler imaging. Femoral Vein: No evidence of thrombus. Normal compressibility, respiratory phasicity and response to augmentation. Popliteal Vein:  No evidence of thrombus. Normal compressibility, respiratory phasicity and response to augmentation. Calf Veins: No evidence of thrombus. Normal compressibility and flow on color Doppler imaging. Superficial Great Saphenous Vein: No evidence of thrombus. Normal compressibility and flow on color Doppler imaging. Venous Reflux: None. Other Findings: Multiple prominent left inguinal lymph nodes the largest measuring 21 mm in greatest diameter. These demonstrate fatty hila with no significant cortical thickening and are presumably reactive. IMPRESSION: No evidence of deep venous thrombosis. Electronically Signed By: Skipper Cliche M.D. On: 11/15/2014 12:34   Ct Extrem Lower W Cm Bil  11/19/2014 CLINICAL DATA: Pain, swelling and erythema in the left groin and thigh for 1 week. No acute injury or prior relevant surgery. History of chronic lymphocytic leukemia. EXAM: CT PELVIS AND BILATERAL LOWER EXTREMITIES WITH CONTRAST TECHNIQUE: Multidetector CT imaging of the pelvis and bilateral lower extremities was performed according to the standard protocol following bolus administration of intravenous contrast. Multiplanar CT image reconstructions were also generated. CONTRAST: 122m OMNIPAQUE IOHEXOL 300 MG/ML SOLN COMPARISON: Doppler ultrasound 11/15/2014. Abdominal CT  10/11/2014. FINDINGS: CT PELVIS FINDINGS No intrapelvic inflammatory changes or fluid collections demonstrated. There is moderate stool throughout the colon. The appendix appears normal. There is aortoiliac atherosclerosis and moderate enlargement of the prostate gland. The bladder appears unremarkable. Bilateral scrotal hydroceles noted. There are postsurgical changes in the left inguinal region without evidence of significant recurrent hernia. The bones are demineralized without acute findings. 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 Patient is status post spinal augmentation at L4 and L5. CT BILATERAL LOWER EXTREMITY FINDINGS There is asymmetric enlargement of the left thigh with diffuse dermal thickening. There is asymmetric subcutaneous edema in the proximal thigh anteriorly and medially. More distally, this edema is more circumferential. No focal fluid collections are identified. Edema extends into the fascia within the posterior compartment. No intramuscular fluid collections identified. There are probable reactive lymph nodes within the left groin. There is no significant hip joint effusion. Diffuse vascular calcifications are noted throughout the femoral arteries. The left femoral vein is opacified and appears patent. There is limited contrast material within the right femoral vein. No DVT demonstrated. There is no evidence of femoral bone destruction, acute fracture or dislocation. IMPRESSION: 1. Findings are consistent with left thigh cellulitis and possible fasciitis. No focal fluid collection to suggest abscess. 2. No evidence of osteomyelitis. 3. No significant intrapelvic inflammatory changes. 4. Diffuse atherosclerosis with asymmetric femoral vein enhancement (decreased on the right). This asymmetry may be secondary to hyperemia on the left. 5. Bilateral scrotal hydroceles. Electronically Signed By: WRichardean SaleM.D. On: 11/19/2014 18:59     ASSESSMENT: 1. Cellulitis  of lower extremity. CT scan was negative for abscess. Patient states that it edema is improving slowly except in the medial left groin area, and per discussion with surgeon Dr. SJamal Collinwho plans to get ultrasound of this area to rule out localized process or fluid collection, in case it needs to be drained. Continue ongoing broad-spectrum antibiotic with Vancomycin/Zosyn/Clindamycin. Repeat met-B tomorrow. 2. Autoimmune hemolytic anemia with lymphoproliferative disease - on prednisone 10 mg. Patient is somewhat immunocompromised because of prednisone as well as rituximab therapy. CBC from yesterday shows normal WBC count, ANC normal at 3900, mild anemia and platelet count is normal. Continue to monitor blood counts. 3. Anemia  - likely secondary to known lymphoproliferative disorder and ongoing infection. No new symptoms. Continue to monitor. Will repeat CBC tomorrow.  4. Encouraged patient to try and ambulate. 5. Nutrition - eating well. 6. Patient and  family present explained above details, they are agreeable to this plan.

## 2014-11-22 NOTE — Progress Notes (Signed)
[name], male   DOB: 1929-08-01, 81 y.o.   MRN: [id number] Since admission pt has had reduction of redness in left thigh Has had some fever overnight. Today he still has red indurated 1cm area left upper anteromedial thigh. No fluctuance noted. CT scan showed evidence of cellulitis, no loculated fluid or abscess. Will get an [place] to see if he has developed an abscess in upper left thigh.

## 2014-11-23 LAB — CBC WITH DIFFERENTIAL/PLATELET
BASOS PCT: 0 %
Basophils Absolute: 0 10*3/uL (ref 0–0.1)
Eosinophils Absolute: 0.1 10*3/uL (ref 0–0.7)
Eosinophils Relative: 1 %
HCT: 30.2 % — ABNORMAL LOW (ref 40.0–52.0)
HEMOGLOBIN: 10.2 g/dL — AB (ref 13.0–18.0)
LYMPHS PCT: 47 %
Lymphs Abs: 3.8 10*3/uL — ABNORMAL HIGH (ref 1.0–3.6)
MCH: 36.5 pg — AB (ref 26.0–34.0)
MCHC: 33.9 g/dL (ref 32.0–36.0)
MCV: 107.7 fL — ABNORMAL HIGH (ref 80.0–100.0)
Monocytes Absolute: 1 10*3/uL (ref 0.2–1.0)
Monocytes Relative: 12 %
Neutro Abs: 3.2 10*3/uL (ref 1.4–6.5)
Neutrophils Relative %: 40 %
Platelets: 395 10*3/uL (ref 150–440)
RBC: 2.8 MIL/uL — ABNORMAL LOW (ref 4.40–5.90)
RDW: 15.8 % — AB (ref 11.5–14.5)
WBC: 8 10*3/uL (ref 3.8–10.6)

## 2014-11-23 LAB — VANCOMYCIN, TROUGH: VANCOMYCIN TR: 6 ug/mL — AB (ref 10–20)

## 2014-11-23 LAB — BASIC METABOLIC PANEL
Anion gap: 7 (ref 5–15)
BUN: 13 mg/dL (ref 6–20)
CHLORIDE: 102 mmol/L (ref 101–111)
CO2: 29 mmol/L (ref 22–32)
Calcium: 8.6 mg/dL — ABNORMAL LOW (ref 8.9–10.3)
Creatinine, Ser: 0.87 mg/dL (ref 0.61–1.24)
GFR calc Af Amer: >60 mL/min (ref >60)
GLUCOSE: 106 mg/dL — AB (ref 65–99)
POTASSIUM: 3.8 mmol/L (ref 3.5–5.1)
Sodium: 138 mmol/L (ref 135–145)

## 2014-11-23 MED ORDER — VANCOMYCIN HCL IN DEXTROSE 750-5 MG/150ML-% IV SOLN
750.0000 mg | Freq: Two times a day (BID) | INTRAVENOUS | Status: DC
  Administered 2014-11-24: 08:00:00 750 mg via INTRAVENOUS
  Filled 2014-11-23 (×3): qty 150

## 2014-11-23 MED ORDER — ENOXAPARIN SODIUM 40 MG/0.4ML ~~LOC~~ SOLN
40.0000 mg | SUBCUTANEOUS | Status: DC
  Administered 2014-11-23 – 2014-11-28 (×5): 40 mg via SUBCUTANEOUS
  Filled 2014-11-23 (×5): qty 0.4

## 2014-11-23 NOTE — Progress Notes (Signed)
CHIEF COMPLAINT:  Cellulitis of left inguinal and lower extremity not responding to outpatient intravenous therapy.   Oncology History   Chief Complaint/Problem List  1. Autoimmune hemolytic anemia. Previously received Prednisone taper.  2. Splenectomy in 5/02.  3. Gastric ulcer diagnosed on endoscopy and multiple biopsies are pending, November 2005. 4. Chronic lymphocytic leukemia. 5.vitamin B 12 deficiency 6. Recent admission in the hospital in June of 2016 with hemolytic anemia and hemoglobin of 5.5 patient received blood transfusion was started on steroid 7. Patient was started on rituximab (October 20, 2014       History of present illness:  Patient states that his left groin and lower extremity discomfort continues to slowly improve but is still weak and is in the groin prevents him from ambulating more. Otherwise eating study. Occasionally feels body temperature is higher, temperature record has been in the range of 97.8-99.2. Denies chills or rigors. On multiple antibiotics including Zosyn, vancomycin and clindamycin. No vomiting or diarrhea. No bleeding symptoms. No new dyspnea at rest, chest pain, orthopnea or PND.  REVIEW OF SYSTEMS:  General status: Overall feels better, ambulating to the bathroom only. No new fevers or chills.  HEENT: Denies epistaxis, headaches or jaw pain. No mouth sores Lungs: No cough or shortness of breath Cardiac: No chest pain or paroxysmal nocturnal dyspnea GI: No nausea, vomiting or diarrhea. No abdominal pain Skin: Cellulitis of Lower extremity redness and tenderness Neurological system: No headaches or new focal weakness.  PAST MEDICAL HISTORY: Past Medical History  Diagnosis Date  . CLL (chronic lymphoblastic leukemia) 2006    Rituxan treatment 2006  . Atrial septal aneurysm 2009    Insignificant, no, January, 2009  . Infection of PEG site     cellulitis.Marland KitchenResolved  .  Vertebral compression fracture     multiple levels  . Herpes zoster     right chest  . Aortic insufficiency 2009    mild, Mild, echo, 2012  . Diastolic dysfunction     mild  . Chest pain     EF 55%, echo, October, 2009, possible inferior and posterior borderline hypokinesis, patient has never had cardiac catheterization  . Edema     EF 55%, echo, October, 2009, possible borderline inferior and posterior hypokinesis... heart catheterization has never been done(July 10, 2009)  . Myositis     Caused pulmonary insufficiency with muscle weakness in the past / Dr.Willis-neurology, Imuran and prednisone  . Calculus of kidney   . Inguinal hernia without mention of obstruction or gangrene, unilateral or unspecified, (not specified as recurrent)     right   . Unspecified gastritis and gastroduodenitis without mention of hemorrhage   . Abdominal pain, epigastric   . Nonspecific elevation of levels of transaminase or lactic acid dehydrogenase (LDH)   . Feeding difficulties and mismanagement   . Edema of male genital organs     Resolved, occurred during illness related to myositis  . Swelling of arm     left.Marland KitchenMarland KitchenResolved.... no DVT  . LFT elevation     Related to Imuran in the past... dose was adjusted  . Dyslipidemia     statins not used due to LFT abnormalities  . Shortness of breath     Breathing abnormalities related to muscle weakness from myositis  . RBBB (right bundle branch block)     Old  . Bradycardia     Sinus bradycardia, old  . Ejection fraction     EF 60%, echo, October, 2012  . Mitral regurgitation  Mild / moderate, echo, 2012  . Right ventricular dysfunction     Mild, echo, 2012  . Febrile illness     May, 2013, question  sinusitis  . Polymyositis 11/26/2012    PAST SURGICAL HISTORY: Past Surgical History  Procedure Laterality Date  . Splenectomy    . Hernia repair      left side  . Peg placement  06/2007  . Peg tube removal  12/2007  . Kyphosis surgery      multilevel vertebral  . Odontoid fracture surgery      anterior screw fixation  . Nasal sinus surgery      x2  . Cataract extraction      bi lateral    FAMILY HISTORY Family History  Problem Relation Age of Onset  . Breast cancer Sister   . Colon cancer Brother   . Prostate cancer Brother   . Stroke Mother   . Hypertension Mother   . Emphysema Father   . Diabetes Grandchild   . Heart attack Neg Hx   . Cancer Brother   . Cancer Sister   . Cancer Daughter     ADVANCED DIRECTIVES:  Patient does have advance healthcare directive, Patient does not desire to make any changes  HEALTH MAINTENANCE: Social History  Substance Use Topics  . Smoking status: Former Smoker -- 0.50 packs/day for 31 years    Types: Cigarettes    Quit date: 06/29/1968  . Smokeless tobacco: Former Systems developer    Types: Chew  . Alcohol Use: No     Comment: rarely     Allergies  Allergen Reactions  . Sulfonamide Derivatives    PHYSICAL EXAM: Vital signs - 98.2, 54, 18, 120/54, 92-95% GENERAL: Patient is alert and oriented and in no acute distress. There is no icterus. HEENT: Extraocular movements intact. Oral exam negative for any thrush or erythema.  CVS: S1S2, regular   LUNGS: Bilaterally clear to auscultation. No crepitations or rhonchi. ABDOMEN: Soft, nontender.  EXTREMITIES: No right pedal edema. Erythema in the left lower extremity continues to gradually improve, no  major swelling. Also area of marked erythma/induration in the medial left groin seems slightly better. No discharge or bleeding. NEURO: Cranial nerves intact, grossly nonfocal   LAB RESULTS: 11/23/14 - potassium 3.8, creatinine 0.87, calcium 8.6, WBC 8.0, hemoglobin 10.2, platelets 395, 40% neutrophils, ANC 3.2. Blood cultures negative so far.  Admission on 11/19/2014  Component Date Value Ref Range Status  . Sodium 11/19/2014 135  135 - 145 mmol/L Final  . Potassium 11/19/2014 4.7  3.5 - 5.1 mmol/L Final  . Chloride 11/19/2014 103  101 - 111 mmol/L Final  . CO2 11/19/2014 26  22 - 32 mmol/L Final  . Glucose, Bld 11/19/2014 91  65 - 99 mg/dL Final  . BUN 11/19/2014 24* 6 - 20 mg/dL Final  . Creatinine, Ser 11/19/2014 0.72  0.61 - 1.24 mg/dL Final  . Calcium 11/19/2014 8.0* 8.9 - 10.3 mg/dL Final  . GFR calc non Af Amer 11/19/2014 >60  >60 mL/min Final  . GFR calc Af Amer 11/19/2014 >60  >60 mL/min Final   Comment: (NOTE) The eGFR has been calculated using the CKD EPI equation. This calculation has not been validated in all clinical situations. eGFR's persistently <60 mL/min signify possible Chronic Kidney Disease.   . Anion gap 11/19/2014 6  5 - 15 Final  . WBC 11/20/2014 8.9  3.8 - 10.6 K/uL Final  . RBC 11/20/2014 3.01* 4.40 - 5.90 MIL/uL Final  .  Hemoglobin 11/20/2014 10.9* 13.0 - 18.0 g/dL Final  . HCT 11/20/2014 33.1* 40.0 - 52.0 % Final  . MCV 11/20/2014 109.7* 80.0 - 100.0 fL Final  . MCH 11/20/2014 36.3* 26.0 - 34.0 pg Final  . MCHC 11/20/2014 33.1  32.0 - 36.0 g/dL Final  . RDW 11/20/2014 16.1* 11.5 - 14.5 % Final  . Platelets 11/20/2014 331  150 - 440 K/uL Final  . Neutrophils Relative % 11/20/2014 75    Final  . Neutro Abs 11/20/2014 6.7* 1.4 - 6.5 K/uL Final  . Lymphocytes Relative 11/20/2014 13   Final  . Lymphs Abs 11/20/2014 1.1  1.0 - 3.6 K/uL Final  . Monocytes Relative 11/20/2014 12   Final  . Monocytes Absolute 11/20/2014 1.1* 0.2 - 1.0 K/uL Final  . Eosinophils Relative 11/20/2014 0   Final  . Eosinophils Absolute 11/20/2014 0.0  0 - 0.7 K/uL Final  . Basophils Relative 11/20/2014 0   Final  . Basophils Absolute 11/20/2014 0.0  0 - 0.1 K/uL Final  . WBC 11/21/2014 9.1  3.8 - 10.6 K/uL Final  . RBC 11/21/2014 2.78* 4.40 - 5.90 MIL/uL Final  . Hemoglobin 11/21/2014 10.1* 13.0 - 18.0 g/dL Final  . HCT 11/21/2014 30.5* 40.0 - 52.0 % Final  . MCV 11/21/2014 110.0* 80.0 - 100.0 fL Final  . MCH 11/21/2014 36.4* 26.0 - 34.0 pg Final  . MCHC 11/21/2014 33.1  32.0 - 36.0 g/dL Final  . RDW 11/21/2014 16.0* 11.5 - 14.5 % Final  . Platelets 11/21/2014 320  150 - 440 K/uL Final  . Neutrophils Relative % 11/21/2014 43   Final  . Neutro Abs 11/21/2014 3.9  1.4 - 6.5 K/uL Final  . Lymphocytes Relative 11/21/2014 42   Final  . Lymphs Abs 11/21/2014 3.9* 1.0 - 3.6 K/uL Final  . Monocytes Relative 11/21/2014 14   Final  . Monocytes Absolute 11/21/2014 1.2* 0.2 - 1.0 K/uL Final  . Eosinophils Relative 11/21/2014 0   Final  . Eosinophils Absolute 11/21/2014 0.0  0 - 0.7 K/uL Final  . Basophils Relative 11/21/2014 1   Final  . Basophils Absolute 11/21/2014 0.1  0 - 0.1 K/uL Final  . Sodium 11/21/2014 137  135 - 145 mmol/L Final  . Potassium 11/21/2014 4.1  3.5 - 5.1 mmol/L Final  . Chloride 11/21/2014 102  101 - 111 mmol/L Final   . CO2 11/21/2014 29  22 - 32 mmol/L Final  . Glucose, Bld 11/21/2014 116* 65 - 99 mg/dL Final  . BUN 11/21/2014 16  6 - 20 mg/dL Final  . Creatinine, Ser 11/21/2014 0.80  0.61 - 1.24 mg/dL Final  . Calcium 11/21/2014 8.6* 8.9 - 10.3 mg/dL Final  . Total Protein 11/21/2014 5.2* 6.5 - 8.1 g/dL Final  . Albumin 11/21/2014 2.5* 3.5 - 5.0 g/dL Final  . AST 11/21/2014 32  15 - 41 U/L Final  . ALT 11/21/2014 28  17 - 63 U/L Final  . Alkaline Phosphatase 11/21/2014 62  38 - 126 U/L Final  . Total Bilirubin 11/21/2014 0.9  0.3 - 1.2 mg/dL Final  . GFR calc non Af Amer 11/21/2014 >60  >60 mL/min Final  . GFR calc Af Amer 11/21/2014 >60  >60 mL/min Final   Comment: (NOTE) The eGFR has been calculated using the CKD EPI equation. This calculation has not been validated in all clinical situations. eGFR's persistently <60 mL/min signify possible Chronic Kidney Disease.   . Anion gap 11/21/2014 6  5 - 15 Final  STUDIES:  Imaging Results    Ct Pelvis W Contrast  11/19/2014 CLINICAL DATA: Pain, swelling and erythema in the left groin and thigh for 1 week. No acute injury or prior relevant surgery. History of chronic lymphocytic leukemia. EXAM: CT PELVIS AND BILATERAL LOWER EXTREMITIES WITH CONTRAST TECHNIQUE: Multidetector CT imaging of the pelvis and bilateral lower extremities was performed according to the standard protocol following bolus administration of intravenous contrast. Multiplanar CT image reconstructions were also generated. CONTRAST: 171m OMNIPAQUE IOHEXOL 300 MG/ML SOLN COMPARISON: Doppler ultrasound 11/15/2014. Abdominal CT 10/11/2014. FINDINGS: CT PELVIS FINDINGS No intrapelvic inflammatory changes or fluid collections demonstrated. There is moderate stool throughout the colon. The appendix appears  normal. There is aortoiliac atherosclerosis and moderate enlargement of the prostate gland. The bladder appears unremarkable. Bilateral scrotal hydroceles noted. There are postsurgical changes in the left inguinal region without evidence of significant recurrent hernia. The bones are demineralized without acute findings. 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 Patient is status post spinal augmentation at L4 and L5. CT BILATERAL LOWER EXTREMITY FINDINGS There is asymmetric enlargement of the left thigh with diffuse dermal thickening. There is asymmetric subcutaneous edema in the proximal thigh anteriorly and medially. More distally, this edema is more circumferential. No focal fluid collections are identified. Edema extends into the fascia within the posterior compartment. No intramuscular fluid collections identified. There are probable reactive lymph nodes within the left groin. There is no significant hip joint effusion. Diffuse vascular calcifications are noted throughout the femoral arteries. The left femoral vein is opacified and appears patent. There is limited contrast material within the right femoral vein. No DVT demonstrated. There is no evidence of femoral bone destruction, acute fracture or dislocation. IMPRESSION: 1. Findings are consistent with left thigh cellulitis and possible fasciitis. No focal fluid collection to suggest abscess. 2. No evidence of osteomyelitis. 3. No significant intrapelvic inflammatory changes. 4. Diffuse atherosclerosis with asymmetric femoral vein enhancement (decreased on the right). This asymmetry may be secondary to hyperemia on the left. 5. Bilateral scrotal hydroceles. Electronically Signed By: WRichardean SaleM.D. On: 11/19/2014 18:59   [place]ous Img Lower Unilateral Left  11/15/2014 CLINICAL DATA: Left lower extremity edema and erythema for 4 days EXAM: LEFT LOWER EXTREMITY VENOUS DOPPLER ULTRASOUND TECHNIQUE: Gray-scale sonography with  graded compression, as well as color Doppler and duplex ultrasound were performed to evaluate the lower extremity deep venous systems from the level of the common femoral vein and including the common femoral, femoral, profunda femoral, popliteal and calf veins including the posterior tibial, peroneal and gastrocnemius veins when visible. The superficial great saphenous vein was also interrogated. Spectral Doppler was utilized to evaluate flow at rest and with distal augmentation maneuvers in the common femoral, femoral and popliteal veins. COMPARISON: None. FINDINGS: Contralateral Common Femoral Vein: Respiratory phasicity is normal and symmetric with the symptomatic side. No evidence of thrombus. Normal compressibility. Common Femoral Vein: No evidence of thrombus. Normal compressibility, respiratory phasicity and response to augmentation. Saphenofemoral Junction: No evidence of thrombus. Normal compressibility and flow on color Doppler imaging. Profunda Femoral Vein: No evidence of thrombus. Normal compressibility and flow on color Doppler imaging. Femoral Vein: No evidence of thrombus. Normal compressibility, respiratory phasicity and response to augmentation. Popliteal Vein: No evidence of thrombus. Normal compressibility, respiratory phasicity and response to augmentation. Calf Veins: No evidence of thrombus. Normal compressibility and flow on color Doppler imaging. Superficial Great Saphenous Vein: No evidence of thrombus. Normal compressibility and flow on color Doppler imaging. Venous Reflux: None. Other Findings: Multiple  prominent left inguinal lymph nodes the largest measuring 21 mm in greatest diameter. These demonstrate fatty hila with no significant cortical thickening and are presumably reactive. IMPRESSION: No evidence of deep venous thrombosis. Electronically Signed By: Skipper Cliche M.D. On: 11/15/2014 12:34   Ct Extrem Lower W Cm Bil  11/19/2014 CLINICAL DATA: Pain,  swelling and erythema in the left groin and thigh for 1 week. No acute injury or prior relevant surgery. History of chronic lymphocytic leukemia. EXAM: CT PELVIS AND BILATERAL LOWER EXTREMITIES WITH CONTRAST TECHNIQUE: Multidetector CT imaging of the pelvis and bilateral lower extremities was performed according to the standard protocol following bolus administration of intravenous contrast. Multiplanar CT image reconstructions were also generated. CONTRAST: 132m OMNIPAQUE IOHEXOL 300 MG/ML SOLN COMPARISON: Doppler ultrasound 11/15/2014. Abdominal CT 10/11/2014. FINDINGS: CT PELVIS FINDINGS No intrapelvic inflammatory changes or fluid collections demonstrated. There is moderate stool throughout the colon. The appendix appears normal. There is aortoiliac atherosclerosis and moderate enlargement of the prostate gland. The bladder appears unremarkable. Bilateral scrotal hydroceles noted. There are postsurgical changes in the left inguinal region without evidence of significant recurrent hernia. The bones are demineralized without acute findings. 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 Patient is status post spinal augmentation at L4 and L5. CT BILATERAL LOWER EXTREMITY FINDINGS There is asymmetric enlargement of the left thigh with diffuse dermal thickening. There is asymmetric subcutaneous edema in the proximal thigh anteriorly and medially. More distally, this edema is more circumferential. No focal fluid collections are identified. Edema extends into the fascia within the posterior compartment. No intramuscular fluid collections identified. There are probable reactive lymph nodes within the left groin. There is no significant hip joint effusion. Diffuse vascular calcifications are noted throughout the femoral arteries. The left femoral vein is opacified and appears patent. There is limited contrast material within the right femoral vein. No DVT demonstrated. There is no evidence of femoral  bone destruction, acute fracture or dislocation. IMPRESSION: 1. Findings are consistent with left thigh cellulitis and possible fasciitis. No focal fluid collection to suggest abscess. 2. No evidence of osteomyelitis. 3. No significant intrapelvic inflammatory changes. 4. Diffuse atherosclerosis with asymmetric femoral vein enhancement (decreased on the right). This asymmetry may be secondary to hyperemia on the left. 5. Bilateral scrotal hydroceles. Electronically Signed By: WRichardean SaleM.D. On: 11/19/2014 18:59  11/22/14 - Ultrasound left lower extremity. IMPRESSION: Changes most consistent with cellulitis without definitive focal abscess.     ASSESSMENT: 1. Cellulitis of left groin/left lower extremity. CT scan was negative for abscess - erythema and discomfort in the left lower extremity and groin area continues to improve gradually, continues to have significant findings in the medial left groin area, however ultrasound requested by Dr. SJamal Collinyesterday reported Changes most consistent with cellulitis without definitive focal abscess. Continue ongoing broad-spectrum antibiotic with Vancomycin/Zosyn/Clindamycin. Renal function is doing well. 2. Autoimmune hemolytic anemia with lymphoproliferative disease - on prednisone 10 mg. Patient is somewhat immunocompromised because of prednisone as well as rituximab therapy. CBC shows normal WBC count, ANC normal at 3900, mild anemia and platelet count is normal. Continue to monitor blood counts. 3. Anemia - likely secondary to known lymphoproliferative disorder and ongoing infection. No new symptoms. Continue to monitor. 4. DVT prophylaxis - patient not ambulating much, have encouraged him to do so. Will add Lovenox 0.5 mg/kg (30 mg) subcutaneous daily . 5. Nutrition - eating well. 6. Patient explained above details, he is agreeable to this plan.

## 2014-11-23 NOTE — Progress Notes (Signed)
ANTIBIOTIC CONSULT NOTE - INITIAL  Pharmacy Consult for Vancomycin and Zosyn  Indication: upper left leg abscess.   Allergies  Allergen Reactions  . Sulfonamide Derivatives     Patient Measurements: Height: 5\' 6"  (167.6 cm) Weight: 133 lb (60.328 kg) IBW/kg (Calculated) : 63.8 Adjusted Body Weight:   Vital Signs: Temp: 98.2 F (36.8 C) (08/14 1713) Temp Source: Oral (08/14 1713) BP: 131/68 mmHg (08/14 1713) Pulse Rate: 72 (08/14 1713) Intake/Output from previous day: 08/13 0701 - 08/14 0700 In: 2575.8 [P.O.:720; I.V.:1255.8; IV Piggyback:600] Out: 2250 [Urine:2250] Intake/Output from this shift:    Labs:  Recent Labs  11/21/14 0423 11/21/14 1537 11/23/14 0511  WBC 9.1  --  8.0  HGB 10.1*  --  10.2*  PLT 320  --  395  CREATININE 0.80 0.87 0.87   Estimated Creatinine Clearance: 55.8 mL/min (by C-G formula based on Cr of 0.87).  Recent Labs  11/21/14 1537 11/23/14 2018  [place] 25* 6*     Microbiology: Recent Results (from the past 720 hour(s))  Blood culture (routine x 2)     Status: None   Collection Time: 11/15/14 10:34 AM  Result Value Ref Range Status   Specimen Description BLOOD RIGHT ASSIST CONTROL  Final   Special Requests BOTTLES DRAWN AEROBIC AND ANAEROBIC  1CC  Final   Culture NO GROWTH 5 DAYS  Final   Report Status 11/20/2014 FINAL  Final  Blood culture (routine x 2)     Status: None   Collection Time: 11/15/14 10:41 AM  Result Value Ref Range Status   Specimen Description BLOOD LEFT ASSIST CONTROL  Final   Special Requests BOTTLES DRAWN AEROBIC AND ANAEROBIC  4CC  Final   Culture NO GROWTH 5 DAYS  Final   Report Status 11/20/2014 FINAL  Final    Medical History: Past Medical History  Diagnosis Date  . CLL (chronic lymphoblastic leukemia) 2006    Rituxan treatment 2006  . Atrial septal aneurysm 2009    Insignificant, no, January, 2009  . Infection of PEG site     cellulitis.Marland KitchenResolved  . Vertebral compression fracture    multiple levels  . Herpes zoster     right chest  . Aortic insufficiency 2009    mild, Mild, echo, 2012  . Diastolic dysfunction     mild  . Chest pain     EF 55%, echo, October, 2009, possible inferior and posterior borderline hypokinesis, patient has never  had cardiac catheterization  . Edema     EF 55%, echo, October, 2009, possible borderline inferior and posterior hypokinesis... heart catheterization has never been done(July 10, 2009)  . Myositis     Caused pulmonary insufficiency with muscle weakness in the past / Dr.Willis-neurology, Imuran and prednisone  . Calculus of kidney   . Inguinal hernia without mention of obstruction or gangrene, unilateral or unspecified, (not specified as recurrent)     right   . Unspecified gastritis and gastroduodenitis without mention of hemorrhage   . Abdominal pain, epigastric   . Nonspecific elevation of levels of transaminase or lactic acid dehydrogenase (LDH)   . Feeding difficulties and mismanagement   . Edema of male genital organs     Resolved, occurred during illness related to myositis  . Swelling of arm     left.Marland KitchenMarland KitchenResolved.... no DVT  . LFT elevation     Related to Imuran in the past... dose was adjusted  . Dyslipidemia     statins not used due to LFT abnormalities  .  Shortness of breath     Breathing abnormalities related to muscle weakness from myositis  . RBBB (right bundle branch block)     Old  . Bradycardia     Sinus bradycardia, old  . Ejection fraction     EF 60%, echo, October, 2012  . Mitral regurgitation     Mild / moderate, echo, 2012  . Right ventricular dysfunction     Mild, echo, 2012  . Febrile illness     May, 2013, question sinusitis  . Polymyositis 11/26/2012    Medications:  Scheduled:  . albuterol  3 mL Inhalation QID  . aspirin  81 mg Oral Daily  . budesonide  0.5 mg Nebulization BID  . calcium-vitamin D  1 tablet Oral BID  . citalopram  20 mg Oral Daily  . clindamycin (CLEOCIN) IV  900 mg  Intravenous 3 times per day  . enoxaparin (LOVENOX) injection  40 mg Subcutaneous Q24H  . ferrous sulfate  325 mg Oral Q breakfast  . furosemide  40 mg Oral Daily  . montelukast  10 mg Oral QHS  . mycophenolate  500 mg Oral Daily  . pantoprazole  40 mg Oral Q1200  . piperacillin-tazobactam (ZOSYN)  IV  3.375 g Intravenous 3 times per day  . predniSONE  10 mg Oral Q breakfast  . temazepam  30 mg Oral QHS  . [START ON 11/24/2014] vancomycin  750 mg Intravenous Q12H   Assessment:  8/8: [facility] contacted to dose vancomycin in this 81yo M being treated for cellulitis. Patient was treated with vancomycin 1gm IV once on 8/6, and then started on clindamycin for outpatient treatment. He presented to the ED on 8/8 with apparent worsening of cellulitis and decision made to treat with vancomycin for possible MRSA infection unresponsive to clindamycin.   Vancomycin 1500mg  IV once loading dose given on 8/8  Patient receive vancomycin 1250 mg IV on 8/10 @ ~1330.   Problem started in left upper thigh in front. He has received Vancomycin as opt, had a Duplex study that showed no clots in deep veins.  Will dose based on PK parameters Kel (hr-1): 0.055 Half-life (hrs): 12.60 Vd (liters): 42.21 (factor used: 0.7 L/kg)  Goal of Therapy:  Vancomycin trough level 10-15 mcg/ml Vancomycin trough level 15-20 mcg/ml  Plan:   Will start Vancomycin 750 mg IV q12 hours @ 04:00 on 8/11.  Will order Vancomycin trough level prior to the 16:00 dose on 8/12.   8/12: Vanc trough @ 15:30 = 25 mcg/mL Will adjust dose to Vancomycin 750 mg IV Q18H to start 8/13 @ 9:00. Will draw next trough before 3rd new dose on 8/14 @ 20:30.   8/14 20:30 trough 6. Change back to 750 mg q 12 hours and recheck level before 4th dose. Previous level 25 appears to have been drawn after dose was administered.  Follow up culture results  Nakiah Osgood S 11/23/2014,9:39 PM

## 2014-11-23 NOTE — Plan of Care (Signed)
Problem: Discharge Progression Outcomes Goal: Other Discharge Outcomes/Goals Outcome: Progressing Plan of Care Progress to Goal:   Pt has rested comfortably during shift. Pt was concerned about temp at the beginning of shift. Per pt his norm is about 28. Pt reported pain and was given norco and tylenol. Pt temp this am is back to about baseline. Pt is happy with temp at this time. VSS. No other signs of distress noted. Will continue to monitor.

## 2014-11-23 NOTE — Progress Notes (Signed)
Patient has orders for Lovenox 30mg  SubQ q24 hrs for DVT prophylaxis.   Per UpToDate, patient's renal function and body weight qualifies him for a full dose of Lovenox 40mg  SubQ q24hrs.   Estimated Creatinine Clearance: 55.8 mL/min (by C-G formula based on Cr of 0.87).  Orders adjusted accordingly.  Roe Coombs, PharmD Clinical Pharmacist

## 2014-11-23 NOTE — Plan of Care (Signed)
Problem: Discharge Progression Outcomes Goal: Other Discharge Outcomes/Goals Plan of care progress to goal for:  1. Discharge Plan:         Plan to Discharge Home. 2. Pain:         Norco 1 tablet given for Left Leg Pain. Improvement Noted. 3. Hemodynamically Stable:         VSS.         Remains on IVF's.         Remains on IV Antibiotics.         Labs Improving.            4. Complications:         No signs/symptoms of complications noted.          5. Diet:         Regular Diet. Tolerating Well. 6. Activity:         Ambulating Independently. 7. Wound Improving/Decreased Edema:          Redness from groin to below knee. Scrotum to foot edematous. Lower left leg swollen with +1             pitting edema. Improvement noted.

## 2014-11-23 NOTE — Progress Notes (Signed)
[name], male   DOB: [birth year], 81 y.o.   MRN: [id number] Pt with no complaints except for mild pain in left upper thigh. He is afebrile. Redness and induration in upper left thigh is improved from yesterday. No fluctuance noted. US showed no loculated fluid.  Slow progress. No need for surgical intervention at present. Pt is on Vanc, Zosyn and cleocin. He is at risk for C. Diff colitis. May want to consider stopping cleocin. Have asked pt to include yogurt in his diet.

## 2014-11-24 ENCOUNTER — Ambulatory Visit: Payer: Medicare Other | Admitting: Oncology

## 2014-11-24 ENCOUNTER — Other Ambulatory Visit: Payer: Medicare Other

## 2014-11-24 MED ORDER — VANCOMYCIN HCL IN DEXTROSE 750-5 MG/150ML-% IV SOLN
750.0000 mg | Freq: Two times a day (BID) | INTRAVENOUS | Status: DC
  Administered 2014-11-24 – 2014-11-26 (×5): 750 mg via INTRAVENOUS
  Filled 2014-11-24 (×6): qty 150

## 2014-11-24 MED ORDER — ALBUTEROL SULFATE (2.5 MG/3ML) 0.083% IN NEBU
3.0000 mL | INHALATION_SOLUTION | RESPIRATORY_TRACT | Status: DC | PRN
  Administered 2014-11-27 – 2014-11-28 (×2): 3 mL via RESPIRATORY_TRACT
  Filled 2014-11-24 (×2): qty 3

## 2014-11-24 NOTE — Progress Notes (Signed)
[facility] INFECTI[facility] PROGRESS NOTE Date of Admission:  11/19/2014     ID: Quindell [name] is a 81 y.o. male with  Thigh cellulitis Active Problems:   Cellulitis   Subjective: Afebrile Continues to improve but still with significant swelling and some pain. USS done did not show abscess  ROS  Eleven systems are reviewed and negative except per hpi  Medications:  Antibiotics Given (last 72 hours)    Date/Time Action Medication Dose Rate   11/21/14 2123 Given   piperacillin-tazobactam (ZOSYN) IVPB 3.375 g 3.375 g 12.5 mL/hr   11/21/14 2123 Given   clindamycin (CLEOCIN) IVPB 900 mg 900 mg 100 mL/hr   11/22/14 0512 Given   piperacillin-tazobactam (ZOSYN) IVPB 3.375 g 3.375 g 12.5 mL/hr   11/22/14 9629 Given   clindamycin (CLEOCIN) IVPB 900 mg 900 mg 100 mL/hr   11/22/14 0900 Given   vancomycin (VANCOCIN) IVPB 750 mg/150 ml premix 750 mg 150 mL/hr   11/22/14 1353 Given   piperacillin-tazobactam (ZOSYN) IVPB 3.375 g 3.375 g 12.5 mL/hr   11/22/14 1353 Given   clindamycin (CLEOCIN) IVPB 900 mg 900 mg 100 mL/hr   11/22/14 2146 Given   clindamycin (CLEOCIN) IVPB 900 mg 900 mg 100 mL/hr   11/22/14 2248 Given   piperacillin-tazobactam (ZOSYN) IVPB 3.375 g 3.375 g 12.5 mL/hr   11/23/14 0225 Given   vancomycin (VANCOCIN) IVPB 750 mg/150 ml premix 750 mg 150 mL/hr   11/23/14 0528 Given   clindamycin (CLEOCIN) IVPB 900 mg 900 mg 100 mL/hr   11/23/14 5284 Given   piperacillin-tazobactam (ZOSYN) IVPB 3.375 g 3.375 g 12.5 mL/hr   11/23/14 1400 Given   piperacillin-tazobactam (ZOSYN) IVPB 3.375 g 3.375 g 12.5 mL/hr   11/23/14 1400 Given   clindamycin (CLEOCIN) IVPB 900 mg 900 mg 100 mL/hr   11/23/14 2122 Given   piperacillin-tazobactam (ZOSYN) IVPB 3.375 g 3.375 g 12.5 mL/hr   11/23/14 2122 Given   clindamycin (CLEOCIN) IVPB 900 mg 900 mg 100 mL/hr   11/23/14 2122 Given   vancomycin (VANCOCIN) IVPB 750 mg/150 ml premix 750 mg 150 mL/hr   11/24/14 0508 Given   piperacillin-tazobactam (ZOSYN) IVPB 3.375 g 3.375 g 12.5 mL/hr   11/24/14 0508 Given   clindamycin (CLEOCIN) IVPB 900 mg 900 mg 100 mL/hr   11/24/14 0827 Given   vancomycin (VANCOCIN) IVPB 750 mg/150 ml premix 750 mg 150 mL/hr   11/24/14 1342 Given   clindamycin (CLEOCIN) IVPB 900 mg 900 mg 100 mL/hr   11/24/14 1414 Given   piperacillin-tazobactam (ZOSYN) IVPB 3.375 g 3.375 g 12.5 mL/hr     . aspirin  81 mg Oral Daily  . calcium-vitamin D  1 tablet Oral BID  . citalopram  20 mg Oral Daily  . clindamycin (CLEOCIN) IV  900 mg Intravenous 3 times per day  . enoxaparin (LOVENOX) injection  40 mg Subcutaneous Q24H  . ferrous sulfate  325 mg Oral Q breakfast  . furosemide  40 mg Oral Daily  . montelukast  10 mg Oral QHS  . mycophenolate  500 mg Oral Daily  . pantoprazole  40 mg Oral Q1200  . piperacillin-tazobactam (ZOSYN)  IV  3.375 g Intravenous 3 times per day  . predniSONE  10 mg Oral Q breakfast  . temazepam  30 mg Oral QHS  . vancomycin  750 mg Intravenous Q12H    Objective: Vital signs in last 24 hours: Temp:  [97.9 F (36.6 C)-98.8 F (37.1 C)] 97.9 F (36.6 C) (08/15 1343) Pulse Rate:  [  64-77] 66 (08/15 1343) Resp:  [18-26] 20 (08/15 1343) BP: (127-153)/(60-83) 153/83 mmHg (08/15 1343) SpO2:  [94 %-98 %] 98 % (08/15 1343) Constitutional: He is oriented to person, place, and time.frail HENT: Mouth/Throat: Oropharynx is clear and moist. No oropharyngeal exudate.  Cardiovascular: Normal rate, regular rhythm and normal heart sounds. Exam reveals no gallop and no friction rub.   Pulmonary/Chest: Effort normal and breath sounds normal. No respiratory distress. He has no wheezes.  Abdominal: Soft. Bowel sounds are normal. He exhibits no distension. There is no tenderness.  Lymphadenopathy: He has no cervical adenopathy.  Neurological: He is alert and oriented to person, place, and time.  Skin: R upper inner thigh with mid induration and erythema. Does not cross inguinal  ligament. Mild ttp.  Psychiatric: He has a normal mood and affect. His behavior is normal.   Lab Results  Recent Labs  11/23/14 0511  WBC 8.0  HGB 10.2*  HCT 30.2*  NA 138  K 3.8  CL 102  CO2 29  BUN 13  CREATININE 0.87    Microbiology:  Studies/Results: [place] Extrem Low Left Ltd  11/22/2014   CLINICAL DATA:  Focal swelling in the left medial thigh extending posteriorly  EXAM: ULTRASOUND left LOWER EXTREMITY LIMITED  TECHNIQUE: Ultrasound examination of the lower extremity soft tissues was performed in the area of clinical concern.  COMPARISON:  11/19/2014  FINDINGS: Diffuse subcutaneous edema is noted similar to that seen on the recent CT examination. Scattered subcutaneous fluid is identified although no focal definitive abscess is seen.  IMPRESSION: Changes most consistent with cellulitis without definitive focal abscess.   Electronically Signed   By: Inez Catalina M.D.   On: 11/22/2014 20:26    Assessment/Plan: [name] [name] is a 81 y.o. male with CLL, AIHA, s/p splenectomy, on steroids and rituximab admitted with R upper thigh cellulitis and possible fascitis. Clinically slowly improved with IV abx  Recommendations Continue IV vanco zosyn - dc clindaymycin I am concerned by the very slow improvement- will have him elevate the leg.  If not improving may benefit from I and D despite neg USS as this may be phlegmon. If improving can transition to orals with  Doxy or clinda and cipro  Clinically follow. If worsens will need surgery  Thank you very much for the consult. Will follow with you.  [place], Hard[place]   11/24/2014, 3:38 PM

## 2014-11-24 NOTE — Care Management Important Message (Signed)
Important Message  Patient Details  Name: [name] [name] MRN: [id number] Date of Birth: 1930/02/12   Medicare Important Message Given:  Yes-third notification given    Shelbie Ammons, RN 11/24/2014, 12:47 PM

## 2014-11-24 NOTE — Plan of Care (Signed)
Problem: Discharge Progression Outcomes Goal: Discharge plan in place and appropriate Outcome: Progressing Individualization of Care Pt prefers to be called [name] [name] Hx of CLL, Atrial septal aneurysm, vertebral compression fractures, herpes zoster, aortic insufficiency, edema, myositis, SOB, RBBB. Admitted for cellulitis of the left leg.  1. Discharge plan:Home 2. Pain: Patient cited pain from wound 5/10. Tylenol administered per eMAR with pain resolved upon reassessment. 3. Hemodynamics: Patient afebrile and VSS this shift. Patient concerned with temperature, however, as he says his baseline is 97.8.  Tylenol administered per request.  BP 127/60 mmHg  Pulse 77  Temp(Src) 98.8 F (37.1 C) (Oral)  Resp 18  Ht 5\' 6"  (1.676 m)  Wt 133 lb (60.328 kg)  BMI 21.48 kg/m2  SpO2 96% 4. Diet: Patient on regular diet and tolerating well.  5. Activity: Patient ambulating in room and up to bathroom during the evening. Gait steady and tolerating well. 6. Wound Improvement: Redness from groin to below knee is gradually resolving. Scrotum to foot edematous. Lower left leg swollen with +1 pitting edema.  ID following - Vanco, Zosyn, and Cleocin IV scheduled. CT negative for abcess. Current plan to continue on IV antibiotics until redness and swelling resolves.Per physician note, may change antibiotics to PO on Monday.

## 2014-11-24 NOTE — Progress Notes (Signed)
[name] [name] @ [facility] [phone]  [phone]     [name] [name] OB: 10/08/1929  MR#: [id number]  IPJ#:[id number]  Patient Care Team: [name] Patch, MD as PCP - General (Family Medicine) [name] Ducking, MD (Neurology) [name] Gleason, MD as Consulting Physician (Unknown Physician Specialty) [name] Bjornstad, MD as Consulting Physician (Cardiology) [name] Robinette Haines, MD (General Surgery)  CHIEF COMPLAINT:  Cellulitis of lower extremity not responding to outpatient intravenous therapy  Oncology History   Chief Complaint/Problem List  1.  Autoimmune hemolytic anemia. Previously received Prednisone taper.   2.  Splenectomy in 5/02.  3.  Gastric ulcer diagnosed on endoscopy and multiple biopsies are pending, November 2005. 4.  Chronic lymphocytic leukemia. 5.vitamin B 12 deficiency 6.  Recent admission in the hospital in June of 2016 with hemolytic anemia and hemoglobin of 5.5 patient received blood transfusion was started on steroid 7.  Patient was started on rituximab (October 20, 2014     Chronic lymphocytic leukemia   08/01/2008 Initial Diagnosis Chronic lymphocytic leukemia    Oncology Flowsheet 11/03/2014 11/10/2014 11/20/2014 11/21/2014 11/22/2014 11/23/2014 11/24/2014  cyanocobalamin ((VITAMIN B-12)) IM - 1,000 mcg - - - - -  enoxaparin (LOVENOX) [place] - - - - - 40 mg 40 mg  methylPREDNISolone sodium succinate 40 mg/mL (SOLU-MEDROL) IV - - - - - - -  predniSONE (DELTASONE) PO - - 10 mg 10 mg 10 mg 10 mg 10 mg  riTUXimab (RITUXAN) IV 375 mg/m2 375 mg/m2 - - - - -  zolendronic acid (ZOMETA) IV - - - - - - -    INTERVAL HISTORY:  81 year old gentleman was admitted in the hospital with cellulitis of the left lower extremity. Patient was seen by surgical service as well as by infectious disease specialist On multiple antibiotics including Zosyn, vancomycin and clindamycin Significant improvement in the redness and pain CT scan has been done reviewed there is no  evidence of abscess  November 24, 2014 Patient continues to improve the redness is getting better.  Small area in the left thigh still shows induration.  Dr. Ola Spurr, infectious disease specialist as evaluated patient.  No chills.  No fever.  Patient was running low-grade fever and some abdominal discomfort and diarrhea clindamycin has been discontinued REVIEW OF SYSTEMS:    general status: Patient is feeling weak and tired.  No change in a performance status.  No chills.  No fever. HEENT:.  No evidence of stomatitis Lungs: No cough or shortness of breath Cardiac: No chest pain or paroxysmal nocturnal dyspnea GI: No nausea no vomiting no diarrhea no abdominal pain Skin: No rash Lower extremity redness and tendernesshas  Neurological system: No tingling.  No numbness.  No other focal signs Musculoskeletal system no bony pains Patient low-grade fever As per HPI. Otherwise, a complete review of systems is negatve.  PAST MEDICAL HISTORY: Past Medical History  Diagnosis Date  . CLL (chronic lymphoblastic leukemia) 2006    Rituxan treatment 2006  . Atrial septal aneurysm 2009    Insignificant, no, January, 2009  . Infection of PEG site     cellulitis.Marland KitchenResolved  . Vertebral compression fracture     multiple levels  . Herpes zoster     right chest  . Aortic insufficiency 2009    mild, Mild, echo, 2012  . Diastolic dysfunction     mild  . Chest pain     EF 55%, echo, October, 2009, possible inferior and posterior borderline hypokinesis, patient has never  had cardiac catheterization  .  Edema     EF 55%, echo, October, 2009, possible borderline inferior and posterior hypokinesis... heart catheterization has never been done(July 10, 2009)  . Myositis     Caused pulmonary insufficiency with muscle weakness in the past / Dr.Willis-neurology, Imuran and prednisone  . Calculus of kidney   . Inguinal hernia without mention of obstruction or gangrene, unilateral or unspecified, (not  specified as recurrent)     right   . Unspecified gastritis and gastroduodenitis without mention of hemorrhage   . Abdominal pain, epigastric   . Nonspecific elevation of levels of transaminase or lactic acid dehydrogenase (LDH)   . Feeding difficulties and mismanagement   . Edema of male genital organs     Resolved, occurred during illness related to myositis  . Swelling of arm     left.Marland KitchenMarland KitchenResolved.... no DVT  . LFT elevation     Related to Imuran in the past... dose was adjusted  . Dyslipidemia     statins not used due to LFT abnormalities  . Shortness of breath     Breathing abnormalities related to muscle weakness from myositis  . RBBB (right bundle branch block)     Old  . Bradycardia     Sinus bradycardia, old  . Ejection fraction     EF 60%, echo, October, 2012  . Mitral regurgitation     Mild / moderate, echo, 2012  . Right ventricular dysfunction     Mild, echo, 2012  . Febrile illness     May, 2013, question sinusitis  . Polymyositis 11/26/2012    PAST SURGICAL HISTORY: Past Surgical History  Procedure Laterality Date  . Splenectomy    . Hernia repair      left side  . Peg placement  06/2007  . Peg tube removal  12/2007  . Kyphosis surgery      multilevel vertebral  . Odontoid fracture surgery      anterior screw fixation  . Nasal sinus surgery      x2  . Cataract extraction      bi lateral    FAMILY HISTORY Family History  Problem Relation Age of Onset  . Breast cancer Sister   . Colon cancer Brother   . Prostate cancer Brother   . Stroke Mother   . Hypertension Mother   . Emphysema Father   . Diabetes Grandchild   . Heart attack Neg Hx   . Cancer Brother   . Cancer Sister   . Cancer Daughter     ADVANCED DIRECTIVES:  Patient does have advance healthcare directive, Patient   does not desire to make any changes  HEALTH MAINTENANCE: Social History  Substance Use Topics  . Smoking status: Former Smoker -- 0.50 packs/day for 31 years     Types: Cigarettes    Quit date: 06/29/1968  . Smokeless tobacco: Former Systems developer    Types: Chew  . Alcohol Use: No     Comment: rarely      Allergies  Allergen Reactions  . Sulfonamide Derivatives       OBJECTIVE:  Filed Vitals:   11/24/14 1343  BP: 153/83  Pulse: 66  Temp: 97.9 F (36.6 C)  Resp: 20     Body mass index is 21.48 kg/(m^2).    ECOG FS:1 - Symptomatic but completely ambulatory  PHYSICAL EXAM: Gen. status: Patient is alert oriented not any acute distress Lymphatic system: Supraclavicular, cervical, axillary, inguinal lymph nodes are not palpable Head exam was generally normal. There was no scleral  icterus or corneal arcus. Mucous membranes were moist. Cardiac: Tachycardia Abdominal exam revealed normal bowel sounds. The abdomen was soft, non-tender, and without masses, organomegaly, or appreciable enlargement of the abdominal aorta. Examination of the skin revealed no evidence of significant rashes, suspicious appearing nevi or other concerning lesions. Neurologically, the patient was awake, alert, and oriented to person, place and time. There were no obvious focal neurologic abnormalities. There is significant improvement in redness and pain. Kyphoscoliosis: Osteoporosis and multiple vertebral compression fracture  Skin: Grade 1 rash LAB RESULTS:  Admission on 11/19/2014  Component Date Value Ref Range Status  . Sodium 11/19/2014 135  135 - 145 mmol/L Final  . Potassium 11/19/2014 4.7  3.5 - 5.1 mmol/L Final  . Chloride 11/19/2014 103  101 - 111 mmol/L Final  . CO2 11/19/2014 26  22 - 32 mmol/L Final  . Glucose, Bld 11/19/2014 91  65 - 99 mg/dL Final  . BUN 11/19/2014 24* 6 - 20 mg/dL Final  . Creatinine, Ser 11/19/2014 0.72  0.61 - 1.24 mg/dL Final  . Calcium 11/19/2014 8.0* 8.9 - 10.3 mg/dL Final  . GFR calc non Af Amer 11/19/2014 >60  >60 mL/min Final  . GFR calc Af Amer 11/19/2014 >60  >60 mL/min Final   Comment: (NOTE) The eGFR has been calculated  using the CKD EPI equation. This calculation has not been validated in all clinical situations. eGFR's persistently <60 mL/min signify possible Chronic Kidney Disease.   . Anion gap 11/19/2014 6  5 - 15 Final  . WBC 11/20/2014 8.9  3.8 - 10.6 K/uL Final  . RBC 11/20/2014 3.01* 4.40 - 5.90 MIL/uL Final  . Hemoglobin 11/20/2014 10.9* 13.0 - 18.0 g/dL Final  . HCT 11/20/2014 33.1* 40.0 - 52.0 % Final  . MCV 11/20/2014 109.7* 80.0 - 100.0 fL Final  . MCH 11/20/2014 36.3* 26.0 - 34.0 pg Final  . MCHC 11/20/2014 33.1  32.0 - 36.0 g/dL Final  . RDW 11/20/2014 16.1* 11.5 - 14.5 % Final  . Platelets 11/20/2014 331  150 - 440 K/uL Final  . Neutrophils Relative % 11/20/2014 75   Final  . Neutro Abs 11/20/2014 6.7* 1.4 - 6.5 K/uL Final  . Lymphocytes Relative 11/20/2014 13   Final  . Lymphs Abs 11/20/2014 1.1  1.0 - 3.6 K/uL Final  . Monocytes Relative 11/20/2014 12   Final  . Monocytes Absolute 11/20/2014 1.1* 0.2 - 1.0 K/uL Final  . Eosinophils Relative 11/20/2014 0   Final  . Eosinophils Absolute 11/20/2014 0.0  0 - 0.7 K/uL Final  . Basophils Relative 11/20/2014 0   Final  . Basophils Absolute 11/20/2014 0.0  0 - 0.1 K/uL Final  . WBC 11/21/2014 9.1  3.8 - 10.6 K/uL Final  . RBC 11/21/2014 2.78* 4.40 - 5.90 MIL/uL Final  . Hemoglobin 11/21/2014 10.1* 13.0 - 18.0 g/dL Final  . HCT 11/21/2014 30.5* 40.0 - 52.0 % Final  . MCV 11/21/2014 110.0* 80.0 - 100.0 fL Final  . MCH 11/21/2014 36.4* 26.0 - 34.0 pg Final  . MCHC 11/21/2014 33.1  32.0 - 36.0 g/dL Final  . RDW 11/21/2014 16.0* 11.5 - 14.5 % Final  . Platelets 11/21/2014 320  150 - 440 K/uL Final  . Neutrophils Relative % 11/21/2014 43   Final  . Neutro Abs 11/21/2014 3.9  1.4 - 6.5 K/uL Final  . Lymphocytes Relative 11/21/2014 42   Final  . Lymphs Abs 11/21/2014 3.9* 1.0 - 3.6 K/uL Final  . Monocytes Relative 11/21/2014 14  Final  . Monocytes Absolute 11/21/2014 1.2* 0.2 - 1.0 K/uL Final  . Eosinophils Relative 11/21/2014 0   Final    . Eosinophils Absolute 11/21/2014 0.0  0 - 0.7 K/uL Final  . Basophils Relative 11/21/2014 1   Final  . Basophils Absolute 11/21/2014 0.1  0 - 0.1 K/uL Final  . Sodium 11/21/2014 137  135 - 145 mmol/L Final  . Potassium 11/21/2014 4.1  3.5 - 5.1 mmol/L Final  . Chloride 11/21/2014 102  101 - 111 mmol/L Final  . CO2 11/21/2014 29  22 - 32 mmol/L Final  . Glucose, Bld 11/21/2014 116* 65 - 99 mg/dL Final  . BUN 11/21/2014 16  6 - 20 mg/dL Final  . Creatinine, Ser 11/21/2014 0.80  0.61 - 1.24 mg/dL Final  . Calcium 11/21/2014 8.6* 8.9 - 10.3 mg/dL Final  . Total Protein 11/21/2014 5.2* 6.5 - 8.1 g/dL Final  . Albumin 11/21/2014 2.5* 3.5 - 5.0 g/dL Final  . AST 11/21/2014 32  15 - 41 U/L Final  . ALT 11/21/2014 28  17 - 63 U/L Final  . Alkaline Phosphatase 11/21/2014 62  38 - 126 U/L Final  . Total Bilirubin 11/21/2014 0.9  0.3 - 1.2 mg/dL Final  . GFR calc non Af Amer 11/21/2014 >60  >60 mL/min Final  . GFR calc Af Amer 11/21/2014 >60  >60 mL/min Final   Comment: (NOTE) The eGFR has been calculated using the CKD EPI equation. This calculation has not been validated in all clinical situations. eGFR's persistently <60 mL/min signify possible Chronic Kidney Disease.   . Anion gap 11/21/2014 6  5 - 15 Final  . Vancomycin Tr 11/21/2014 25* 10 - 20 ug/mL Final   CRITICAL RESULT CALLED TO, READ BACK BY AND VERIFIED WITH JASON ROBBINS 11/21/2014 1613 L[facility]  . Creatinine, Ser 11/21/2014 0.87  0.61 - 1.24 mg/dL Final  . GFR calc non Af Amer 11/21/2014 >60  >60 mL/min Final  . GFR calc Af Amer 11/21/2014 >60  >60 mL/min Final   Comment: (NOTE) The eGFR has been calculated using the CKD EPI equation. This calculation has not been validated in all clinical situations. eGFR's persistently <60 mL/min signify possible Chronic Kidney Disease.   . WBC 11/23/2014 8.0  3.8 - 10.6 K/uL Final  . RBC 11/23/2014 2.80* 4.40 - 5.90 MIL/uL Final  . Hemoglobin 11/23/2014 10.2* 13.0 - 18.0 g/dL Final  . HCT  11/23/2014 30.2* 40.0 - 52.0 % Final  . MCV 11/23/2014 107.7* 80.0 - 100.0 fL Final  . MCH 11/23/2014 36.5* 26.0 - 34.0 pg Final  . MCHC 11/23/2014 33.9  32.0 - 36.0 g/dL Final  . RDW 11/23/2014 15.8* 11.5 - 14.5 % Final  . Platelets 11/23/2014 395  150 - 440 K/uL Final  . Neutrophils Relative % 11/23/2014 40   Final  . Neutro Abs 11/23/2014 3.2  1.4 - 6.5 K/uL Final  . Lymphocytes Relative 11/23/2014 47   Final  . Lymphs Abs 11/23/2014 3.8* 1.0 - 3.6 K/uL Final  . Monocytes Relative 11/23/2014 12   Final  . Monocytes Absolute 11/23/2014 1.0  0.2 - 1.0 K/uL Final  . Eosinophils Relative 11/23/2014 1   Final  . Eosinophils Absolute 11/23/2014 0.1  0 - 0.7 K/uL Final  . Basophils Relative 11/23/2014 0   Final  . Basophils Absolute 11/23/2014 0.0  0 - 0.1 K/uL Final  . Sodium 11/23/2014 138  135 - 145 mmol/L Final  . Potassium 11/23/2014 3.8  3.5 - 5.1 mmol/L  Final  . Chloride 11/23/2014 102  101 - 111 mmol/L Final  . CO2 11/23/2014 29  22 - 32 mmol/L Final  . Glucose, Bld 11/23/2014 106* 65 - 99 mg/dL Final  . BUN 11/23/2014 13  6 - 20 mg/dL Final  . Creatinine, Ser 11/23/2014 0.87  0.61 - 1.24 mg/dL Final  . Calcium 11/23/2014 8.6* 8.9 - 10.3 mg/dL Final  . GFR calc non Af Amer 11/23/2014 >60  >60 mL/min Final  . GFR calc Af Amer 11/23/2014 >60  >60 mL/min Final   Comment: (NOTE) The eGFR has been calculated using the CKD EPI equation. This calculation has not been validated in all clinical situations. eGFR's persistently <60 mL/min signify possible Chronic Kidney Disease.   . Anion gap 11/23/2014 7  5 - 15 Final  . Vancomycin Tr 11/23/2014 6* 10 - 20 ug/mL Final      STUDIES: Ct Pelvis W Contrast  11/19/2014   CLINICAL DATA:  Pain, swelling and erythema in the left groin and thigh for 1 week. No acute injury or prior relevant surgery. History of chronic lymphocytic leukemia.  EXAM: CT PELVIS AND BILATERAL LOWER EXTREMITIES WITH CONTRAST  TECHNIQUE: Multidetector CT imaging of  the pelvis and bilateral lower extremities was performed according to the standard protocol following bolus administration of intravenous contrast. Multiplanar CT image reconstructions were also generated.  CONTRAST:  163m OMNIPAQUE IOHEXOL 300 MG/ML  SOLN  COMPARISON:  Doppler ultrasound 11/15/2014. Abdominal CT 10/11/2014.  FINDINGS: CT PELVIS FINDINGS  No intrapelvic inflammatory changes or fluid collections demonstrated. There is moderate stool throughout the colon. The appendix appears normal.  There is aortoiliac atherosclerosis and moderate enlargement of the prostate gland. The bladder appears unremarkable. Bilateral scrotal hydroceles noted.  There are postsurgical changes in the left inguinal region without evidence of significant recurrent hernia.  The bones are demineralized without acute findings. 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 Patient is status post spinal augmentation at L4 and L5.  CT BILATERAL LOWER EXTREMITY FINDINGS  There is asymmetric enlargement of the left thigh with diffuse dermal thickening. There is asymmetric subcutaneous edema in the proximal thigh anteriorly and medially. More distally, this edema is more circumferential. No focal fluid collections are identified. Edema extends into the fascia within the posterior compartment. No intramuscular fluid collections identified.  There are probable reactive lymph nodes within the left groin. There is no significant hip joint effusion.  Diffuse vascular calcifications are noted throughout the femoral arteries. The left femoral vein is opacified and appears patent. There is limited contrast material within the right femoral vein. No DVT demonstrated.  There is no evidence of femoral bone destruction, acute fracture or dislocation.  IMPRESSION: 1. Findings are consistent with left thigh cellulitis and possible fasciitis. No focal fluid collection to suggest abscess. 2. No evidence of osteomyelitis. 3. No significant intrapelvic  inflammatory changes. 4. Diffuse atherosclerosis with asymmetric femoral vein enhancement (decreased on the right). This asymmetry may be secondary to hyperemia on the left. 5. Bilateral scrotal hydroceles.   Electronically Signed   By: WRichardean SaleM.D.   On: 11/19/2014 18:59   [place] Img Lower Unilateral Left  11/15/2014   CLINICAL DATA:  Left lower extremity edema and erythema for 4 days  EXAM: LEFT LOWER EXTREMITY VENOUS DOPPLER ULTRASOUND  TECHNIQUE: Gray-scale sonography with graded compression, as well as color Doppler and duplex ultrasound were performed to evaluate the lower extremity deep venous systems from the level of the common femoral vein and including the  common femoral, femoral, profunda femoral, popliteal and calf veins including the posterior tibial, peroneal and gastrocnemius veins when visible. The superficial great saphenous vein was also interrogated. Spectral Doppler was utilized to evaluate flow at rest and with distal augmentation maneuvers in the common femoral, femoral and popliteal veins.  COMPARISON:  None.  FINDINGS: Contralateral Common Femoral Vein: Respiratory phasicity is normal and symmetric with the symptomatic side. No evidence of thrombus. Normal compressibility.  Common Femoral Vein: No evidence of thrombus. Normal compressibility, respiratory phasicity and response to augmentation.  Saphenofemoral Junction: No evidence of thrombus. Normal compressibility and flow on color Doppler imaging.  Profunda Femoral Vein: No evidence of thrombus. Normal compressibility and flow on color Doppler imaging.  Femoral Vein: No evidence of thrombus. Normal compressibility, respiratory phasicity and response to augmentation.  Popliteal Vein: No evidence of thrombus. Normal compressibility, respiratory phasicity and response to augmentation.  Calf Veins: No evidence of thrombus. Normal compressibility and flow on color Doppler imaging.  Superficial Great Saphenous Vein: No evidence of  thrombus. Normal compressibility and flow on color Doppler imaging.  Venous Reflux:  None.  Other Findings: Multiple prominent left inguinal lymph nodes the largest measuring 21 mm in greatest diameter. These demonstrate fatty hila with no significant cortical thickening and are presumably reactive.  IMPRESSION: No evidence of deep venous thrombosis.   Electronically Signed   By: Skipper Cliche M.D.   On: 11/15/2014 12:34   [place] Extrem Low Left Ltd  11/22/2014   CLINICAL DATA:  Focal swelling in the left medial thigh extending posteriorly  EXAM: ULTRASOUND left LOWER EXTREMITY LIMITED  TECHNIQUE: Ultrasound examination of the lower extremity soft tissues was performed in the area of clinical concern.  COMPARISON:  11/19/2014  FINDINGS: Diffuse subcutaneous edema is noted similar to that seen on the recent CT examination. Scattered subcutaneous fluid is identified although no focal definitive abscess is seen.  IMPRESSION: Changes most consistent with cellulitis without definitive focal abscess.   Electronically Signed   By: Inez Catalina M.D.   On: 11/22/2014 20:26   Ct Extrem Lower W Cm Bil  11/19/2014   CLINICAL DATA:  Pain, swelling and erythema in the left groin and thigh for 1 week. No acute injury or prior relevant surgery. History of chronic lymphocytic leukemia.  EXAM: CT PELVIS AND BILATERAL LOWER EXTREMITIES WITH CONTRAST  TECHNIQUE: Multidetector CT imaging of the pelvis and bilateral lower extremities was performed according to the standard protocol following bolus administration of intravenous contrast. Multiplanar CT image reconstructions were also generated.  CONTRAST:  160m OMNIPAQUE IOHEXOL 300 MG/ML  SOLN  COMPARISON:  Doppler ultrasound 11/15/2014. Abdominal CT 10/11/2014.  FINDINGS: CT PELVIS FINDINGS  No intrapelvic inflammatory changes or fluid collections demonstrated. There is moderate stool throughout the colon. The appendix appears normal.  There is aortoiliac atherosclerosis and  moderate enlargement of the prostate gland. The bladder appears unremarkable. Bilateral scrotal hydroceles noted.  There are postsurgical changes in the left inguinal region without evidence of significant recurrent hernia.  The bones are demineralized without acute findings. 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 Patient is status post spinal augmentation at L4 and L5.  CT BILATERAL LOWER EXTREMITY FINDINGS  There is asymmetric enlargement of the left thigh with diffuse dermal thickening. There is asymmetric subcutaneous edema in the proximal thigh anteriorly and medially. More distally, this edema is more circumferential. No focal fluid collections are identified. Edema extends into the fascia within the posterior compartment. No intramuscular fluid collections identified.  There are probable  reactive lymph nodes within the left groin. There is no significant hip joint effusion.  Diffuse vascular calcifications are noted throughout the femoral arteries. The left femoral vein is opacified and appears patent. There is limited contrast material within the right femoral vein. No DVT demonstrated.  There is no evidence of femoral bone destruction, acute fracture or dislocation.  IMPRESSION: 1. Findings are consistent with left thigh cellulitis and possible fasciitis. No focal fluid collection to suggest abscess. 2. No evidence of osteomyelitis. 3. No significant intrapelvic inflammatory changes. 4. Diffuse atherosclerosis with asymmetric femoral vein enhancement (decreased on the right). This asymmetry may be secondary to hyperemia on the left. 5. Bilateral scrotal hydroceles.   Electronically Signed   By: Richardean Sale M.D.   On: 11/19/2014 18:59    ASSESSMENT: Autoimmune hemolytic anemia with lymphoproliferative disease He  is on prednisone 10 mg.  Patient is somewhat immunocompromised because of prednisone as well as rituximab therapy. Cellulitis of lower extremity CT scan has been consistent with  fasciitis.  No evidence of enlarged lymph node or abscess Ultrasound done over the weekend did not reveal any evidence of abscess. Dr. physiologic note has been appreciated.  Clindamycin was discontinued.  Possibility of discontinuing vancomycin and has some point in time starting patient on Augmentin may be considered.  Left thigh Redness would be monitored carefully MEDICAL DECISION MAKING:  Cellulitis of lower extremity Autoimmune hemolytic anemia.  Hemoglobin is 10.2 which is dropped and repeat CBC would be done.      No matching staging information was found for the patient.  [name] Gleason, MD   11/24/2014 8:16 PM

## 2014-11-24 NOTE — Plan of Care (Signed)
Problem: Discharge Progression Outcomes Goal: Other Discharge Outcomes/Goals Outcome: Progressing Pain medication given x 1 with noted relief. VSS. IV fluids infusing. IV antibiotics given. Tolerating diet, no complaints of nausea. Up walking around unit with 1 assist. Patient states, "cellulitis is improving".

## 2014-11-25 LAB — CBC WITH DIFFERENTIAL/PLATELET
Basophils Absolute: 0.1 10*3/uL (ref 0–0.1)
Basophils Relative: 1 %
Eosinophils Absolute: 0.1 10*3/uL (ref 0–0.7)
Eosinophils Relative: 1 %
HEMATOCRIT: 30 % — AB (ref 40.0–52.0)
HEMOGLOBIN: 10.1 g/dL — AB (ref 13.0–18.0)
LYMPHS ABS: 4.6 10*3/uL — AB (ref 1.0–3.6)
LYMPHS PCT: 44 %
MCH: 36.1 pg — ABNORMAL HIGH (ref 26.0–34.0)
MCHC: 33.7 g/dL (ref 32.0–36.0)
MCV: 107.2 fL — AB (ref 80.0–100.0)
MONOS PCT: 12 %
Monocytes Absolute: 1.2 10*3/uL — ABNORMAL HIGH (ref 0.2–1.0)
NEUTROS ABS: 4.3 10*3/uL (ref 1.4–6.5)
NEUTROS PCT: 42 %
Platelets: 448 10*3/uL — ABNORMAL HIGH (ref 150–440)
RBC: 2.8 MIL/uL — ABNORMAL LOW (ref 4.40–5.90)
RDW: 15.8 % — ABNORMAL HIGH (ref 11.5–14.5)
WBC: 10.3 10*3/uL (ref 3.8–10.6)

## 2014-11-25 LAB — VANCOMYCIN, TROUGH
VANCOMYCIN TR: 14 ug/mL (ref 10–20)
Vancomycin Tr: 25 ug/mL (ref 10–20)

## 2014-11-25 LAB — CREATININE, SERUM
Creatinine, Ser: 0.72 mg/dL (ref 0.61–1.24)
GFR calc Af Amer: >60 mL/min (ref >60)

## 2014-11-25 NOTE — Progress Notes (Signed)
   11/25/14 1046  Clinical Encounter Type  Visited With Patient  Visit Type Follow-up  Follow up visit with patient.  Patient said he was doing much better and is hoping to go home soon.  Michela Pitcher his daughter was coming to visit this afternoon and thanked me very much for coming to visit with him.  [place] [phone]

## 2014-11-25 NOTE — Progress Notes (Signed)
[place] @ [facility] [phone]  [phone]     Martise Waddell OB: January 09, 1930  MR#: [id number]  MHW#:[id number]  Patient Care Team: Juline Patch, MD as PCP - General (Family Medicine) Kathrynn Ducking, MD (Neurology) Forest Gleason, MD as Consulting Physician (Unknown Physician Specialty) Carlena Bjornstad, MD as Consulting Physician (Cardiology) Seeplaputhur Robinette Haines, MD (General Surgery)  CHIEF COMPLAINT:  Cellulitis of lower extremity not responding to outpatient intravenous therapy  Oncology History   Chief Complaint/Problem List  1.  Autoimmune hemolytic anemia. Previously received Prednisone taper.   2.  Splenectomy in 5/02.  3.  Gastric ulcer diagnosed on endoscopy and multiple biopsies are pending, November 2005. 4.  Chronic lymphocytic leukemia. 5.vitamin B 12 deficiency 6.  Recent admission in the hospital in June of 2016 with hemolytic anemia and hemoglobin of 5.5 patient received blood transfusion was started on steroid 7.  Patient was started on rituximab (October 20, 2014     Chronic lymphocytic leukemia   08/01/2008 Initial Diagnosis Chronic lymphocytic leukemia    Oncology Flowsheet 11/10/2014 11/20/2014 11/21/2014 11/22/2014 11/23/2014 11/24/2014 11/25/2014  cyanocobalamin ((VITAMIN B-12)) IM 1,000 mcg - - - - - -  enoxaparin (LOVENOX) Wells - - - - 40 mg 40 mg 40 mg  methylPREDNISolone sodium succinate 40 mg/mL (SOLU-MEDROL) IV - - - - - - -  predniSONE (DELTASONE) PO - 10 mg 10 mg 10 mg 10 mg 10 mg 10 mg  riTUXimab (RITUXAN) IV 375 mg/m2 - - - - - -  zolendronic acid (ZOMETA) IV - - - - - - -    INTERVAL HISTORY:  81 year old gentleman was admitted in the hospital with cellulitis of the left lower extremity. Patient was seen by surgical service as well as by infectious disease specialist On multiple antibiotics including Zosyn, vancomycin and clindamycin Significant improvement in the redness and pain CT scan has been done reviewed there is no  evidence of abscess  November 24, 2014 Patient continues to improve the redness is getting better.  Small area in the left thigh still shows induration.  Dr. Ola Spurr, infectious disease specialist as evaluated patient.  No chills.  No fever.  Patient was running low-grade fever and some abdominal discomfort and diarrhea clindamycin has been discontinued  November 25, 2014. Patient is doing well with significant improvement in redness.  Except for a small area of induration persists. Low-grade fever.  Hemoglobin is 10.1.  Patient is on vancomycin and Zosyn REVIEW OF SYSTEMS:    general status: Patient is feeling weak and tired.  No change in a performance status.  No chills.  No fever. HEENT:.  No evidence of stomatitis Lungs: No cough or shortness of breath Cardiac: No chest pain or paroxysmal nocturnal dyspnea GI: No nausea no vomiting no diarrhea no abdominal pain Skin: No rash Lower extremity redness and tendernesshas  Neurological system: No tingling.  No numbness.  No other focal signs Musculoskeletal system no bony pains Patient low-grade fever As per HPI. Otherwise, a complete review of systems is negatve.  PAST MEDICAL HISTORY: Past Medical History  Diagnosis Date  . CLL (chronic lymphoblastic leukemia) 2006    Rituxan treatment 2006  . Atrial septal aneurysm 2009    Insignificant, no, January, 2009  . Infection of PEG site     cellulitis.Marland KitchenResolved  . Vertebral compression fracture     multiple levels  . Herpes zoster     right chest  . Aortic insufficiency 2009    mild, Mild, echo,  1610  . Diastolic dysfunction     mild  . Chest pain     EF 55%, echo, October, 2009, possible inferior and posterior borderline hypokinesis, patient has never  had cardiac catheterization  . Edema     EF 55%, echo, October, 2009, possible borderline inferior and posterior hypokinesis... heart catheterization has never been done(July 10, 2009)  . Myositis     Caused pulmonary  insufficiency with muscle weakness in the past / Dr.Willis-neurology, Imuran and prednisone  . Calculus of kidney   . Inguinal hernia without mention of obstruction or gangrene, unilateral or unspecified, (not specified as recurrent)     right   . Unspecified gastritis and gastroduodenitis without mention of hemorrhage   . Abdominal pain, epigastric   . Nonspecific elevation of levels of transaminase or lactic acid dehydrogenase (LDH)   . Feeding difficulties and mismanagement   . Edema of male genital organs     Resolved, occurred during illness related to myositis  . Swelling of arm     left.Marland KitchenMarland KitchenResolved.... no DVT  . LFT elevation     Related to Imuran in the past... dose was adjusted  . Dyslipidemia     statins not used due to LFT abnormalities  . Shortness of breath     Breathing abnormalities related to muscle weakness from myositis  . RBBB (right bundle branch block)     Old  . Bradycardia     Sinus bradycardia, old  . Ejection fraction     EF 60%, echo, October, 2012  . Mitral regurgitation     Mild / moderate, echo, 2012  . Right ventricular dysfunction     Mild, echo, 2012  . Febrile illness     May, 2013, question sinusitis  . Polymyositis 11/26/2012    PAST SURGICAL HISTORY: Past Surgical History  Procedure Laterality Date  . Splenectomy    . Hernia repair      left side  . Peg placement  06/2007  . Peg tube removal  12/2007  . Kyphosis surgery      multilevel vertebral  . Odontoid fracture surgery      anterior screw fixation  . Nasal sinus surgery      x2  . Cataract extraction      bi lateral    FAMILY HISTORY Family History  Problem Relation Age of Onset  . Breast cancer Sister   . Colon cancer Brother   . Prostate cancer Brother   . Stroke Mother   . Hypertension Mother   . Emphysema Father   . Diabetes Grandchild   . Heart attack Neg Hx   . Cancer Brother   . Cancer Sister   . Cancer Daughter     ADVANCED DIRECTIVES:  Patient does have  advance healthcare directive, Patient   does not desire to make any changes  HEALTH MAINTENANCE: Social History  Substance Use Topics  . Smoking status: Former Smoker -- 0.50 packs/day for 31 years    Types: Cigarettes    Quit date: 06/29/1968  . Smokeless tobacco: Former Systems developer    Types: Chew  . Alcohol Use: No     Comment: rarely      Allergies  Allergen Reactions  . Sulfonamide Derivatives       OBJECTIVE:  Filed Vitals:   11/25/14 1333  BP: 135/59  Pulse: 53  Temp: 98 F (36.7 C)  Resp:      Body mass index is 21.48 kg/(m^2).    ECOG FS:1 -  Symptomatic but completely ambulatory  PHYSICAL EXAM: Gen. status: Patient is alert oriented not any acute distress Lymphatic system: Supraclavicular, cervical, axillary, inguinal lymph nodes are not palpable Head exam was generally normal. There was no scleral icterus or corneal arcus. Mucous membranes were moist. Cardiac: Tachycardia Abdominal exam revealed normal bowel sounds. The abdomen was soft, non-tender, and without masses, organomegaly, or appreciable enlargement of the abdominal aorta. Examination of the skin revealed no evidence of significant rashes, suspicious appearing nevi or other concerning lesions. Neurologically, the patient was awake, alert, and oriented to person, place and time. There were no obvious focal neurologic abnormalities. There is significant improvement in redness and pain. Kyphoscoliosis: Osteoporosis and multiple vertebral compression fracture  Skin: Grade 1 rash LAB RESULTS:  Admission on 11/19/2014  Component Date Value Ref Range Status  . Sodium 11/19/2014 135  135 - 145 mmol/L Final  . Potassium 11/19/2014 4.7  3.5 - 5.1 mmol/L Final  . Chloride 11/19/2014 103  101 - 111 mmol/L Final  . CO2 11/19/2014 26  22 - 32 mmol/L Final  . Glucose, Bld 11/19/2014 91  65 - 99 mg/dL Final  . BUN 11/19/2014 24* 6 - 20 mg/dL Final  . Creatinine, Ser 11/19/2014 0.72  0.61 - 1.24 mg/dL Final  .  Calcium 11/19/2014 8.0* 8.9 - 10.3 mg/dL Final  . GFR calc non Af Amer 11/19/2014 >60  >60 mL/min Final  . GFR calc Af Amer 11/19/2014 >60  >60 mL/min Final   Comment: (NOTE) The eGFR has been calculated using the CKD EPI equation. This calculation has not been validated in all clinical situations. eGFR's persistently <60 mL/min signify possible Chronic Kidney Disease.   . Anion gap 11/19/2014 6  5 - 15 Final  . WBC 11/20/2014 8.9  3.8 - 10.6 K/uL Final  . RBC 11/20/2014 3.01* 4.40 - 5.90 MIL/uL Final  . Hemoglobin 11/20/2014 10.9* 13.0 - 18.0 g/dL Final  . HCT 11/20/2014 33.1* 40.0 - 52.0 % Final  . MCV 11/20/2014 109.7* 80.0 - 100.0 fL Final  . MCH 11/20/2014 36.3* 26.0 - 34.0 pg Final  . MCHC 11/20/2014 33.1  32.0 - 36.0 g/dL Final  . RDW 11/20/2014 16.1* 11.5 - 14.5 % Final  . Platelets 11/20/2014 331  150 - 440 K/uL Final  . Neutrophils Relative % 11/20/2014 75   Final  . Neutro Abs 11/20/2014 6.7* 1.4 - 6.5 K/uL Final  . Lymphocytes Relative 11/20/2014 13   Final  . Lymphs Abs 11/20/2014 1.1  1.0 - 3.6 K/uL Final  . Monocytes Relative 11/20/2014 12   Final  . Monocytes Absolute 11/20/2014 1.1* 0.2 - 1.0 K/uL Final  . Eosinophils Relative 11/20/2014 0   Final  . Eosinophils Absolute 11/20/2014 0.0  0 - 0.7 K/uL Final  . Basophils Relative 11/20/2014 0   Final  . Basophils Absolute 11/20/2014 0.0  0 - 0.1 K/uL Final  . WBC 11/21/2014 9.1  3.8 - 10.6 K/uL Final  . RBC 11/21/2014 2.78* 4.40 - 5.90 MIL/uL Final  . Hemoglobin 11/21/2014 10.1* 13.0 - 18.0 g/dL Final  . HCT 11/21/2014 30.5* 40.0 - 52.0 % Final  . MCV 11/21/2014 110.0* 80.0 - 100.0 fL Final  . MCH 11/21/2014 36.4* 26.0 - 34.0 pg Final  . MCHC 11/21/2014 33.1  32.0 - 36.0 g/dL Final  . RDW 11/21/2014 16.0* 11.5 - 14.5 % Final  . Platelets 11/21/2014 320  150 - 440 K/uL Final  . Neutrophils Relative % 11/21/2014 43   Final  .  Neutro Abs 11/21/2014 3.9  1.4 - 6.5 K/uL Final  . Lymphocytes Relative 11/21/2014 42    Final  . Lymphs Abs 11/21/2014 3.9* 1.0 - 3.6 K/uL Final  . Monocytes Relative 11/21/2014 14   Final  . Monocytes Absolute 11/21/2014 1.2* 0.2 - 1.0 K/uL Final  . Eosinophils Relative 11/21/2014 0   Final  . Eosinophils Absolute 11/21/2014 0.0  0 - 0.7 K/uL Final  . Basophils Relative 11/21/2014 1   Final  . Basophils Absolute 11/21/2014 0.1  0 - 0.1 K/uL Final  . Sodium 11/21/2014 137  135 - 145 mmol/L Final  . Potassium 11/21/2014 4.1  3.5 - 5.1 mmol/L Final  . Chloride 11/21/2014 102  101 - 111 mmol/L Final  . CO2 11/21/2014 29  22 - 32 mmol/L Final  . Glucose, Bld 11/21/2014 116* 65 - 99 mg/dL Final  . BUN 11/21/2014 16  6 - 20 mg/dL Final  . Creatinine, Ser 11/21/2014 0.80  0.61 - 1.24 mg/dL Final  . Calcium 11/21/2014 8.6* 8.9 - 10.3 mg/dL Final  . Total Protein 11/21/2014 5.2* 6.5 - 8.1 g/dL Final  . Albumin 11/21/2014 2.5* 3.5 - 5.0 g/dL Final  . AST 11/21/2014 32  15 - 41 U/L Final  . ALT 11/21/2014 28  17 - 63 U/L Final  . Alkaline Phosphatase 11/21/2014 62  38 - 126 U/L Final  . Total Bilirubin 11/21/2014 0.9  0.3 - 1.2 mg/dL Final  . GFR calc non Af Amer 11/21/2014 >60  >60 mL/min Final  . GFR calc Af Amer 11/21/2014 >60  >60 mL/min Final   Comment: (NOTE) The eGFR has been calculated using the CKD EPI equation. This calculation has not been validated in all clinical situations. eGFR's persistently <60 mL/min signify possible Chronic Kidney Disease.   . Anion gap 11/21/2014 6  5 - 15 Final  . Vancomycin Tr 11/21/2014 25* 10 - 20 ug/mL Final   CRITICAL RESULT CALLED TO, READ BACK BY AND VERIFIED WITH JASON ROBBINS 11/21/2014 1613 [facility]  . Creatinine, Ser 11/21/2014 0.87  0.61 - 1.24 mg/dL Final  . GFR calc non Af Amer 11/21/2014 >60  >60 mL/min Final  . GFR calc Af Amer 11/21/2014 >60  >60 mL/min Final   Comment: (NOTE) The eGFR has been calculated using the CKD EPI equation. This calculation has not been validated in all clinical situations. eGFR's persistently <60  mL/min signify possible Chronic Kidney Disease.   . WBC 11/23/2014 8.0  3.8 - 10.6 K/uL Final  . RBC 11/23/2014 2.80* 4.40 - 5.90 MIL/uL Final  . Hemoglobin 11/23/2014 10.2* 13.0 - 18.0 g/dL Final  . HCT 11/23/2014 30.2* 40.0 - 52.0 % Final  . MCV 11/23/2014 107.7* 80.0 - 100.0 fL Final  . MCH 11/23/2014 36.5* 26.0 - 34.0 pg Final  . MCHC 11/23/2014 33.9  32.0 - 36.0 g/dL Final  . RDW 11/23/2014 15.8* 11.5 - 14.5 % Final  . Platelets 11/23/2014 395  150 - 440 K/uL Final  . Neutrophils Relative % 11/23/2014 40   Final  . Neutro Abs 11/23/2014 3.2  1.4 - 6.5 K/uL Final  . Lymphocytes Relative 11/23/2014 47   Final  . Lymphs Abs 11/23/2014 3.8* 1.0 - 3.6 K/uL Final  . Monocytes Relative 11/23/2014 12   Final  . Monocytes Absolute 11/23/2014 1.0  0.2 - 1.0 K/uL Final  . Eosinophils Relative 11/23/2014 1   Final  . Eosinophils Absolute 11/23/2014 0.1  0 - 0.7 K/uL Final  . Basophils Relative 11/23/2014  0   Final  . Basophils Absolute 11/23/2014 0.0  0 - 0.1 K/uL Final  . Sodium 11/23/2014 138  135 - 145 mmol/L Final  . Potassium 11/23/2014 3.8  3.5 - 5.1 mmol/L Final  . Chloride 11/23/2014 102  101 - 111 mmol/L Final  . CO2 11/23/2014 29  22 - 32 mmol/L Final  . Glucose, Bld 11/23/2014 106* 65 - 99 mg/dL Final  . BUN 11/23/2014 13  6 - 20 mg/dL Final  . Creatinine, Ser 11/23/2014 0.87  0.61 - 1.24 mg/dL Final  . Calcium 11/23/2014 8.6* 8.9 - 10.3 mg/dL Final  . GFR calc non Af Amer 11/23/2014 >60  >60 mL/min Final  . GFR calc Af Amer 11/23/2014 >60  >60 mL/min Final   Comment: (NOTE) The eGFR has been calculated using the CKD EPI equation. This calculation has not been validated in all clinical situations. eGFR's persistently <60 mL/min signify possible Chronic Kidney Disease.   . Anion gap 11/23/2014 7  5 - 15 Final  . Vancomycin Tr 11/23/2014 6* 10 - 20 ug/mL Final  . WBC 11/25/2014 10.3  3.8 - 10.6 K/uL Final  . RBC 11/25/2014 2.80* 4.40 - 5.90 MIL/uL Final  . Hemoglobin  11/25/2014 10.1* 13.0 - 18.0 g/dL Final  . HCT 11/25/2014 30.0* 40.0 - 52.0 % Final  . MCV 11/25/2014 107.2* 80.0 - 100.0 fL Final  . MCH 11/25/2014 36.1* 26.0 - 34.0 pg Final  . MCHC 11/25/2014 33.7  32.0 - 36.0 g/dL Final  . RDW 11/25/2014 15.8* 11.5 - 14.5 % Final  . Platelets 11/25/2014 448* 150 - 440 K/uL Final  . Neutrophils Relative % 11/25/2014 42   Final  . Neutro Abs 11/25/2014 4.3  1.4 - 6.5 K/uL Final  . Lymphocytes Relative 11/25/2014 44   Final  . Lymphs Abs 11/25/2014 4.6* 1.0 - 3.6 K/uL Final  . Monocytes Relative 11/25/2014 12   Final  . Monocytes Absolute 11/25/2014 1.2* 0.2 - 1.0 K/uL Final  . Eosinophils Relative 11/25/2014 1   Final  . Eosinophils Absolute 11/25/2014 0.1  0 - 0.7 K/uL Final  . Basophils Relative 11/25/2014 1   Final  . Basophils Absolute 11/25/2014 0.1  0 - 0.1 K/uL Final  . Creatinine, Ser 11/25/2014 0.72  0.61 - 1.24 mg/dL Final  . GFR calc non Af Amer 11/25/2014 >60  >60 mL/min Final  . GFR calc Af Amer 11/25/2014 >60  >60 mL/min Final   Comment: (NOTE) The eGFR has been calculated using the CKD EPI equation. This calculation has not been validated in all clinical situations. eGFR's persistently <60 mL/min signify possible Chronic Kidney Disease.   . Vancomycin Tr 11/25/2014 25* 10 - 20 ug/mL Final   Comment: CRITICAL RESULT CALLED TO, READ BACK BY AND VERIFIED WITH CHRISTINE KATSOUDAS AT 0831 ON 11/25/14.Marland KitchenMarland KitchenWestend Hospital       STUDIES: Ct Pelvis W Contrast  11/19/2014   CLINICAL DATA:  Pain, swelling and erythema in the left groin and thigh for 1 week. No acute injury or prior relevant surgery. History of chronic lymphocytic leukemia.  EXAM: CT PELVIS AND BILATERAL LOWER EXTREMITIES WITH CONTRAST  TECHNIQUE: Multidetector CT imaging of the pelvis and bilateral lower extremities was performed according to the standard protocol following bolus administration of intravenous contrast. Multiplanar CT image reconstructions were also generated.  CONTRAST:   123m OMNIPAQUE IOHEXOL 300 MG/ML  SOLN  COMPARISON:  Doppler ultrasound 11/15/2014. Abdominal CT 10/11/2014.  FINDINGS: CT PELVIS FINDINGS  No intrapelvic inflammatory changes or fluid  collections demonstrated. There is moderate stool throughout the colon. The appendix appears normal.  There is aortoiliac atherosclerosis and moderate enlargement of the prostate gland. The bladder appears unremarkable. Bilateral scrotal hydroceles noted.  There are postsurgical changes in the left inguinal region without evidence of significant recurrent hernia.  The bones are demineralized without acute findings. 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 Patient is status post spinal augmentation at L4 and L5.  CT BILATERAL LOWER EXTREMITY FINDINGS  There is asymmetric enlargement of the left thigh with diffuse dermal thickening. There is asymmetric subcutaneous edema in the proximal thigh anteriorly and medially. More distally, this edema is more circumferential. No focal fluid collections are identified. Edema extends into the fascia within the posterior compartment. No intramuscular fluid collections identified.  There are probable reactive lymph nodes within the left groin. There is no significant hip joint effusion.  Diffuse vascular calcifications are noted throughout the femoral arteries. The left femoral vein is opacified and appears patent. There is limited contrast material within the right femoral vein. No DVT demonstrated.  There is no evidence of femoral bone destruction, acute fracture or dislocation.  IMPRESSION: 1. Findings are consistent with left thigh cellulitis and possible fasciitis. No focal fluid collection to suggest abscess. 2. No evidence of osteomyelitis. 3. No significant intrapelvic inflammatory changes. 4. Diffuse atherosclerosis with asymmetric femoral vein enhancement (decreased on the right). This asymmetry may be secondary to hyperemia on the left. 5. Bilateral scrotal hydroceles.    Electronically Signed   By: Richardean Sale M.D.   On: 11/19/2014 18:59   US Venous Img Lower Unilateral Left  11/15/2014   CLINICAL DATA:  Left lower extremity edema and erythema for 4 days  EXAM: LEFT LOWER EXTREMITY VENOUS DOPPLER ULTRASOUND  TECHNIQUE: Gray-scale sonography with graded compression, as well as color Doppler and duplex ultrasound were performed to evaluate the lower extremity deep venous systems from the level of the common femoral vein and including the common femoral, femoral, profunda femoral, popliteal and calf veins including the posterior tibial, peroneal and gastrocnemius veins when visible. The superficial great saphenous vein was also interrogated. Spectral Doppler was utilized to evaluate flow at rest and with distal augmentation maneuvers in the common femoral, femoral and popliteal veins.  COMPARISON:  None.  FINDINGS: Contralateral Common Femoral Vein: Respiratory phasicity is normal and symmetric with the symptomatic side. No evidence of thrombus. Normal compressibility.  Common Femoral Vein: No evidence of thrombus. Normal compressibility, respiratory phasicity and response to augmentation.  Saphenofemoral Junction: No evidence of thrombus. Normal compressibility and flow on color Doppler imaging.  Profunda Femoral Vein: No evidence of thrombus. Normal compressibility and flow on color Doppler imaging.  Femoral Vein: No evidence of thrombus. Normal compressibility, respiratory phasicity and response to augmentation.  Popliteal Vein: No evidence of thrombus. Normal compressibility, respiratory phasicity and response to augmentation.  Calf Veins: No evidence of thrombus. Normal compressibility and flow on color Doppler imaging.  Superficial Great Saphenous Vein: No evidence of thrombus. Normal compressibility and flow on color Doppler imaging.  Venous Reflux:  None.  Other Findings: Multiple prominent left inguinal lymph nodes the largest measuring 21 mm in greatest diameter. These  demonstrate fatty hila with no significant cortical thickening and are presumably reactive.  IMPRESSION: No evidence of deep venous thrombosis.   Electronically Signed   By: Skipper Cliche M.D.   On: 11/15/2014 12:34   [place] Extrem Low Left Ltd  11/22/2014   CLINICAL DATA:  Focal swelling in the left medial  thigh extending posteriorly  EXAM: ULTRASOUND left LOWER EXTREMITY LIMITED  TECHNIQUE: Ultrasound examination of the lower extremity soft tissues was performed in the area of clinical concern.  COMPARISON:  11/19/2014  FINDINGS: Diffuse subcutaneous edema is noted similar to that seen on the recent CT examination. Scattered subcutaneous fluid is identified although no focal definitive abscess is seen.  IMPRESSION: Changes most consistent with cellulitis without definitive focal abscess.   Electronically Signed   By: Inez Catalina M.D.   On: 11/22/2014 20:26   Ct Extrem Lower W Cm Bil  11/19/2014   CLINICAL DATA:  Pain, swelling and erythema in the left groin and thigh for 1 week. No acute injury or prior relevant surgery. History of chronic lymphocytic leukemia.  EXAM: CT PELVIS AND BILATERAL LOWER EXTREMITIES WITH CONTRAST  TECHNIQUE: Multidetector CT imaging of the pelvis and bilateral lower extremities was performed according to the standard protocol following bolus administration of intravenous contrast. Multiplanar CT image reconstructions were also generated.  CONTRAST:  146m OMNIPAQUE IOHEXOL 300 MG/ML  SOLN  COMPARISON:  Doppler ultrasound 11/15/2014. Abdominal CT 10/11/2014.  FINDINGS: CT PELVIS FINDINGS  No intrapelvic inflammatory changes or fluid collections demonstrated. There is moderate stool throughout the colon. The appendix appears normal.  There is aortoiliac atherosclerosis and moderate enlargement of the prostate gland. The bladder appears unremarkable. Bilateral scrotal hydroceles noted.  There are postsurgical changes in the left inguinal region without evidence of significant recurrent  hernia.  The bones are demineralized without acute findings. 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 Patient is status post spinal augmentation at L4 and L5.  CT BILATERAL LOWER EXTREMITY FINDINGS  There is asymmetric enlargement of the left thigh with diffuse dermal thickening. There is asymmetric subcutaneous edema in the proximal thigh anteriorly and medially. More distally, this edema is more circumferential. No focal fluid collections are identified. Edema extends into the fascia within the posterior compartment. No intramuscular fluid collections identified.  There are probable reactive lymph nodes within the left groin. There is no significant hip joint effusion.  Diffuse vascular calcifications are noted throughout the femoral arteries. The left femoral vein is opacified and appears patent. There is limited contrast material within the right femoral vein. No DVT demonstrated.  There is no evidence of femoral bone destruction, acute fracture or dislocation.  IMPRESSION: 1. Findings are consistent with left thigh cellulitis and possible fasciitis. No focal fluid collection to suggest abscess. 2. No evidence of osteomyelitis. 3. No significant intrapelvic inflammatory changes. 4. Diffuse atherosclerosis with asymmetric femoral vein enhancement (decreased on the right). This asymmetry may be secondary to hyperemia on the left. 5. Bilateral scrotal hydroceles.   Electronically Signed   By: WRichardean SaleM.D.   On: 11/19/2014 18:59    ASSESSMENT: Autoimmune hemolytic anemia with lymphoproliferative disease He  is on prednisone 10 mg.  Patient is somewhat immunocompromised because of prednisone as well as rituximab therapy. . Slow recovery from cellulitis Continue antibiotics Dr. SJamal Collinto evaluate ultrasound on a bedside and decide whether patient needs any aspiration or drainage Patient was encouraged for ambulation MEDICAL DECISION MAKING:  Cellulitis of lower extremity Autoimmune  hemolytic anemia.  Hemoglobin is 10.2 which is dropped and repeat CBC would be done.      No matching staging information was found for the patient.  JForest Gleason MD   11/25/2014 4:06 PM

## 2014-11-25 NOTE — Plan of Care (Signed)
Problem: Discharge Progression Outcomes Goal: Discharge plan in place and appropriate Outcome: Progressing Individualization of Care Pt prefers to be called Ed Hx of CLL, Atrial septal aneurysm, vertebral compression fractures, herpes zoster, aortic insufficiency, edema, myositis, SOB, RBBB. Admitted for cellulitis of the left leg.  1. Discharge plan:Home 2. Pain: Patient cited pain from wound 5/10. Norco admitted x1 for leg pain with stated relief upon reassessment.  Tylenol administered at hs for headache.  Headache pain resolved upon reassessment. 3. Hemodynamics: Patient afebrile and VSS this shift. BP 152/64 mmHg  Pulse 51  Temp(Src) 98 F (36.7 C) (Oral)  Resp 22  Ht 5\' 6"  (1.676 m)  Wt 133 lb (60.328 kg)  BMI 21.48 kg/m2  SpO2 97% 4. Diet: Patient on regular diet and tolerating well.  5. Activity: Patient ambulating in room and up to bathroom during the evening. Gait steady and tolerating well. 6. Wound Improvement: Redness from groin to below knee is gradually resolving. Scrotum to foot edematous. Lower left leg swollen with +1 pitting edema.Foot and leg elevated on pillows this shift.  Redness and swelling is slowly improving since previous night assessment.

## 2014-11-25 NOTE — Progress Notes (Signed)
ANTIBIOTIC CONSULT NOTE - INITIAL  Pharmacy Consult for Vancomycin and Zosyn  Indication: upper left leg abscess.   Allergies  Allergen Reactions  . Sulfonamide Derivatives     Patient Measurements: Height: 5\' 6"  (167.6 cm) Weight: 133 lb (60.328 kg) IBW/kg (Calculated) : 63.8 Adjusted Body Weight:   Vital Signs: Temp: 98 F (36.7 C) (08/16 1333) Temp Source: Oral (08/16 1333) BP: 135/59 mmHg (08/16 1333) Pulse Rate: 53 (08/16 1333) Intake/Output from previous day: 08/15 0701 - 08/16 0700 In: 2136 [P.O.:720; I.V.:1209; IV Piggyback:207] Out: 950 [Urine:950] Intake/Output from this shift: Total I/O In: 480 [P.O.:480] Out: 1300 [Urine:1300]  Labs:  Recent Labs  11/23/14 0511 11/25/14 0804  WBC 8.0 10.3  HGB 10.2* 10.1*  PLT 395 448*  CREATININE 0.87 0.72   Estimated Creatinine Clearance: 60.7 mL/min (by C-G formula based on Cr of 0.72).  Recent Labs  11/25/14 0805 11/25/14 1603  VANCOTROUGH 25* 14     Microbiology: Recent Results (from the past 720 hour(s))  Blood culture (routine x 2)     Status: None   Collection Time: 11/15/14 10:34 AM  Result Value Ref Range Status   Specimen Description BLOOD RIGHT ASSIST CONTROL  Final   Special Requests BOTTLES DRAWN AEROBIC AND ANAEROBIC  1CC  Final   Culture NO GROWTH 5 DAYS  Final   Report Status 11/20/2014 FINAL  Final  Blood culture (routine x 2)     Status: None   Collection Time: 11/15/14 10:41 AM  Result Value Ref Range Status   Specimen Description BLOOD LEFT ASSIST CONTROL  Final   Special Requests BOTTLES DRAWN AEROBIC AND ANAEROBIC  4CC  Final   Culture NO GROWTH 5 DAYS  Final   Report Status 11/20/2014 FINAL  Final   Medications:  Anti-infectives    Start     Dose/Rate Route Frequency Ordered Stop   11/24/14 1615  vancomycin (VANCOCIN) IVPB 750 mg/150 ml premix     750 mg 150 mL/hr over 60 Minutes Intravenous Every 12 hours 11/24/14 1612     11/24/14 0900  vancomycin (VANCOCIN) IVPB 750  mg/150 ml premix  Status:  Discontinued     750 mg 150 mL/hr over 60 Minutes Intravenous Every 12 hours 11/23/14 2138 11/24/14 1607   11/22/14 1500  vancomycin (VANCOCIN) IVPB 750 mg/150 ml premix  Status:  Discontinued     750 mg 150 mL/hr over 60 Minutes Intravenous Every 24 hours 11/21/14 1646 11/21/14 1650   11/22/14 0900  vancomycin (VANCOCIN) IVPB 750 mg/150 ml premix  Status:  Discontinued     750 mg 150 mL/hr over 60 Minutes Intravenous Every 18 hours 11/21/14 1650 11/23/14 2138   11/22/14 0300  vancomycin (VANCOCIN) IVPB 750 mg/150 ml premix  Status:  Discontinued     750 mg 150 mL/hr over 60 Minutes Intravenous Every 18 hours 11/21/14 1643 11/21/14 1646   11/20/14 1330  vancomycin (VANCOCIN) 1,250 mg in sodium chloride 0.9 % 250 mL IVPB  Status:  Discontinued     1,250 mg 166.7 mL/hr over 90 Minutes Intravenous Every 24 hours 11/19/14 1628 11/19/14 1651   11/20/14 0400  vancomycin (VANCOCIN) IVPB 750 mg/150 ml premix  Status:  Discontinued     750 mg 150 mL/hr over 60 Minutes Intravenous Every 12 hours 11/19/14 1629 11/21/14 1643   11/19/14 2200  clindamycin (CLEOCIN) IVPB 900 mg  Status:  Discontinued     900 mg 100 mL/hr over 30 Minutes Intravenous 3 times per day 11/19/14 2125 11/24/14  1542   11/19/14 1630  piperacillin-tazobactam (ZOSYN) IVPB 3.375 g     3.375 g 12.5 mL/hr over 240 Minutes Intravenous 3 times per day 11/19/14 1629         Assessment: Pharmacy consulted to dose vancomycin for cellulitis in this 81 year old male. Patient currently on vancomycin and zoysn, ID following.   Will dose based on PK parameters Kel (hr-1): 0.055 Half-life (hrs): 12.60 Vd (liters): 42.21 (factor used: 0.7 L/kg)  Goal of Therapy:  Target trough: 10-29mcg/ml for cellulitis  Plan:  Current orders for vancomycin 750mg  IV Q12H.   VT at 1400 = 14  Continue vancomycin 750mg  IV q 12h Check another trough at least weekly unless renal function changes then will check  sooner.  Continue zosyn 3.375gm IV Q8H EI  Pharmacy to follow per consult   11/25/2014,5:04 PM

## 2014-11-25 NOTE — Progress Notes (Signed)
ANTIBIOTIC CONSULT NOTE - INITIAL  Pharmacy Consult for Vancomycin and Zosyn  Indication: upper left leg abscess.   Allergies  Allergen Reactions  . Sulfonamide Derivatives     Patient Measurements: Height: 5\' 6"  (167.6 cm) Weight: 133 lb (60.328 kg) IBW/kg (Calculated) : 63.8 Adjusted Body Weight:   Vital Signs: Temp: 98.2 F (36.8 C) (08/16 0442) Temp Source: Oral (08/16 0442) BP: 116/55 mmHg (08/16 0658) Pulse Rate: 48 (08/16 0658) Intake/Output from previous day: 08/15 0701 - 08/16 0700 In: 2136 [P.O.:720; I.V.:1209; IV Piggyback:207] Out: 950 [Urine:950] Intake/Output from this shift: Total I/O In: -  Out: 300 [Urine:300]  Labs:  Recent Labs  11/23/14 0511 11/25/14 0804  WBC 8.0 10.3  HGB 10.2* 10.1*  PLT 395 448*  CREATININE 0.87 0.72   Estimated Creatinine Clearance: 60.7 mL/min (by C-G formula based on Cr of 0.72).  Recent Labs  11/23/14 2018 11/25/14 0805  VANCOTROUGH 6* 25*     Microbiology: Recent Results (from the past 720 hour(s))  Blood culture (routine x 2)     Status: None   Collection Time: 11/15/14 10:34 AM  Result Value Ref Range Status   Specimen Description BLOOD RIGHT ASSIST CONTROL  Final   Special Requests BOTTLES DRAWN AEROBIC AND ANAEROBIC  1CC  Final   Culture NO GROWTH 5 DAYS  Final   Report Status 11/20/2014 FINAL  Final  Blood culture (routine x 2)     Status: None   Collection Time: 11/15/14 10:41 AM  Result Value Ref Range Status   Specimen Description BLOOD LEFT ASSIST CONTROL  Final   Special Requests BOTTLES DRAWN AEROBIC AND ANAEROBIC  4CC  Final   Culture NO GROWTH 5 DAYS  Final   Report Status 11/20/2014 FINAL  Final   Medications:  Anti-infectives    Start     Dose/Rate Route Frequency Ordered Stop   11/24/14 1615  vancomycin (VANCOCIN) IVPB 750 mg/150 ml premix     750 mg 150 mL/hr over 60 Minutes Intravenous Every 12 hours 11/24/14 1612     11/24/14 0900  vancomycin (VANCOCIN) IVPB 750 mg/150 ml premix   Status:  Discontinued     750 mg 150 mL/hr over 60 Minutes Intravenous Every 12 hours 11/23/14 2138 11/24/14 1607   11/22/14 1500  vancomycin (VANCOCIN) IVPB 750 mg/150 ml premix  Status:  Discontinued     750 mg 150 mL/hr over 60 Minutes Intravenous Every 24 hours 11/21/14 1646 11/21/14 1650   11/22/14 0900  vancomycin (VANCOCIN) IVPB 750 mg/150 ml premix  Status:  Discontinued     750 mg 150 mL/hr over 60 Minutes Intravenous Every 18 hours 11/21/14 1650 11/23/14 2138   11/22/14 0300  vancomycin (VANCOCIN) IVPB 750 mg/150 ml premix  Status:  Discontinued     750 mg 150 mL/hr over 60 Minutes Intravenous Every 18 hours 11/21/14 1643 11/21/14 1646   11/20/14 1330  vancomycin (VANCOCIN) 1,250 mg in sodium chloride 0.9 % 250 mL IVPB  Status:  Discontinued     1,250 mg 166.7 mL/hr over 90 Minutes Intravenous Every 24 hours 11/19/14 1628 11/19/14 1651   11/20/14 0400  vancomycin (VANCOCIN) IVPB 750 mg/150 ml premix  Status:  Discontinued     750 mg 150 mL/hr over 60 Minutes Intravenous Every 12 hours 11/19/14 1629 11/21/14 1643   11/19/14 2200  clindamycin (CLEOCIN) IVPB 900 mg  Status:  Discontinued     900 mg 100 mL/hr over 30 Minutes Intravenous 3 times per day 11/19/14 2125 11/24/14  1542   11/19/14 1630  piperacillin-tazobactam (ZOSYN) IVPB 3.375 g     3.375 g 12.5 mL/hr over 240 Minutes Intravenous 3 times per day 11/19/14 1629         Assessment: Pharmacy consulted to dose vancomycin for cellulitis in this 81 year old male. Patient currently on vancomycin and zoysn, ID following.   Will dose based on PK parameters Kel (hr-1): 0.055 Half-life (hrs): 12.60 Vd (liters): 42.21 (factor used: 0.7 L/kg)  Goal of Therapy:  Target trough: 10-12mcg/ml for cellulitis  Plan:  Current orders for vancomycin 750mg  IV Q12H.   Vancomycin level this AM = 25, however dose was hung 4 hours early. Will not be able to interpret this level as due to two doses given in < 12 hours. Will recheck prior  to this afternoon dose.   Continue zosyn 3.375gm IV Q8H EI  Pharmacy to follow per consult  Rexene Edison, PharmD Clinical Pharmacist  11/25/2014,8:36 AM

## 2014-11-25 NOTE — Progress Notes (Signed)
Initial Nutrition Assessment   INTERVENTION:   Meals and Snacks: Cater to patient preferences   NUTRITION DIAGNOSIS:   No nutrition diagnosis at this time  GOAL:   Patient will meet greater than or equal to 90% of their needs  MONITOR:    (Energy Intake, Anthropometrics, Anemia Profile)  REASON FOR ASSESSMENT:   LOS    ASSESSMENT:    Pt with cellulitis on lower extremity, per MD note no abscess or enlarged lymph nodes.  Past Medical History  Diagnosis Date  . CLL (chronic lymphoblastic leukemia) 2006    Rituxan treatment 2006  . Atrial septal aneurysm 2009    Insignificant, no, January, 2009  . Infection of PEG site     cellulitis.Marland KitchenResolved  . Vertebral compression fracture     multiple levels  . Herpes zoster     right chest  . Aortic insufficiency 2009    mild, Mild, echo, 2012  . Diastolic dysfunction     mild  . Chest pain     EF 55%, echo, October, 2009, possible inferior and posterior borderline hypokinesis, patient has never  had cardiac catheterization  . Edema     EF 55%, echo, October, 2009, possible borderline inferior and posterior hypokinesis... heart catheterization has never been done(July 10, 2009)  . Myositis     Caused pulmonary insufficiency with muscle weakness in the past / Dr.Willis-neurology, Imuran and prednisone  . Calculus of kidney   . Inguinal hernia without mention of obstruction or gangrene, unilateral or unspecified, (not specified as recurrent)     right   . Unspecified gastritis and gastroduodenitis without mention of hemorrhage   . Abdominal pain, epigastric   . Nonspecific elevation of levels of transaminase or lactic acid dehydrogenase (LDH)   . Feeding difficulties and mismanagement   . Edema of male genital organs     Resolved, occurred during illness related to myositis  . Swelling of arm     left.Marland KitchenMarland KitchenResolved.... no DVT  . LFT elevation     Related to Imuran in the past... dose was adjusted  . Dyslipidemia    statins not used due to LFT abnormalities  . Shortness of breath     Breathing abnormalities related to muscle weakness from myositis  . RBBB (right bundle branch block)     Old  . Bradycardia     Sinus bradycardia, old  . Ejection fraction     EF 60%, echo, October, 2012  . Mitral regurgitation     Mild / moderate, echo, 2012  . Right ventricular dysfunction     Mild, echo, 2012  . Febrile illness     May, 2013, question sinusitis  . Polymyositis 11/26/2012     Diet Order:  Diet regular Room service appropriate?: Yes; Fluid consistency:: Thin    Current Nutrition: Pt ate 100% of lunch this afternoon. Recorded po intake 85-100% of meals since admission.   Food/Nutrition-Related History: Pt reports appetite is 'great' and has been good PTA   Medications: Calcium-vitamin D, Ferrous Sulfate, Protonix, NS at 47mL/hr  Electrolyte/Renal Profile and Glucose Profile:   Recent Labs Lab 11/19/14 1717 11/21/14 0423 11/21/14 1537 11/23/14 0511 11/25/14 0804  NA 135 137  --  138  --   K 4.7 4.1  --  3.8  --   CL 103 102  --  102  --   CO2 26 29  --  29  --   BUN 24* 16  --  13  --   CREATININE  0.72 0.80 0.87 0.87 0.72  CALCIUM 8.0* 8.6*  --  8.6*  --   GLUCOSE 91 116*  --  106*  --    Protein Profile:  Recent Labs Lab 11/21/14 0423  ALBUMIN 2.5*   Nutritional Anemia Profile:  CBC Latest Ref Rng 11/25/2014 11/23/2014 11/21/2014  WBC 3.8 - 10.6 K/uL 10.3 8.0 9.1  Hemoglobin 13.0 - 18.0 g/dL 10.1(L) 10.2(L) 10.1(L)  Hematocrit 40.0 - 52.0 % 30.0(L) 30.2(L) 30.5(L)  Platelets 150 - 440 K/uL 448(H) 395 320    Gastrointestinal Profile: Last BM:  11/24/2014  Skin:  Reviewed, no issues   Weight Change: No change in weight PTA reported   Height:   Ht Readings from Last 1 Encounters:  11/19/14 5\' 6"  (1.676 m)    Weight:   Wt Readings from Last 1 Encounters:  11/19/14 133 lb (60.328 kg)   Wt Readings from Last 10 Encounters:  11/19/14 133 lb (60.328 kg)  11/19/14  133 lb (60.328 kg)  11/19/14 133 lb (60.328 kg)  11/18/14 131 lb 6 oz (59.591 kg)  11/17/14 129 lb 13.6 oz (58.9 kg)  11/15/14 130 lb (58.968 kg)  11/10/14 128 lb 1.4 oz (58.1 kg)  11/03/14 129 lb 13.6 oz (58.9 kg)  10/27/14 130 lb 1.1 oz (59 kg)  10/20/14 133 lb 9.6 oz (60.6 kg)    BMI:  Body mass index is 21.48 kg/(m^2).   EDUCATION NEEDS:   No education needs identified at this time  [place], [place], LDN Pager [phone]

## 2014-11-25 NOTE — Plan of Care (Signed)
Problem: Discharge Progression Outcomes Goal: Other Discharge Outcomes/Goals Outcome: Progressing Pain medication given with noted relief. IV fluids infusing. VSS. Receiving IV antibiotics. Walked around unit with family member. Tolerating diet

## 2014-11-26 MED ORDER — DOXYCYCLINE HYCLATE 100 MG PO TABS: 100.0000 mg | ORAL_TABLET | Freq: Two times a day (BID) | ORAL | Status: DC

## 2014-11-26 MED ORDER — PIPERACILLIN-TAZOBACTAM 3.375 G IVPB 30 MIN: 3.3750 g | Freq: Three times a day (TID) | INTRAVENOUS | Status: DC

## 2014-11-26 MED ORDER — PIPERACILLIN-TAZOBACTAM 3.375 G IVPB
3.3750 g | Freq: Three times a day (TID) | INTRAVENOUS | Status: DC
  Administered 2014-11-26 – 2014-11-28 (×5): 3.375 g via INTRAVENOUS
  Filled 2014-11-26 (×9): qty 50

## 2014-11-26 MED ORDER — AMOXICILLIN-POT CLAVULANATE 875-125 MG PO TABS
1.0000 | ORAL_TABLET | Freq: Two times a day (BID) | ORAL | Status: DC
Start: 2014-11-26 — End: 2014-11-26
  Filled 2014-11-26: qty 1

## 2014-11-26 MED ORDER — VANCOMYCIN HCL IN DEXTROSE 750-5 MG/150ML-% IV SOLN
750.0000 mg | Freq: Two times a day (BID) | INTRAVENOUS | Status: DC
  Administered 2014-11-26 – 2014-11-28 (×4): 750 mg via INTRAVENOUS
  Filled 2014-11-26 (×6): qty 150

## 2014-11-26 MED ORDER — PIPERACILLIN-TAZOBACTAM 3.375 G IVPB
3.3750 g | Freq: Three times a day (TID) | INTRAVENOUS | Status: DC
  Filled 2014-11-26 (×2): qty 50

## 2014-11-26 MED ORDER — VANCOMYCIN HCL IN DEXTROSE 750-5 MG/150ML-% IV SOLN
750.0000 mg | Freq: Two times a day (BID) | INTRAVENOUS | Status: DC
  Filled 2014-11-26: qty 150

## 2014-11-26 NOTE — Care Management Important Message (Signed)
Important Message  Patient Details  Name: [name] [name] MRN: [id number] Date of Birth: August 02, 1929   Medicare Important Message Given:  Yes-fourth notification given    Juliann Pulse A Allmond 11/26/2014, 12:07 PM

## 2014-11-26 NOTE — Progress Notes (Signed)
[place] @ [facility] [phone]  [phone]     [name] [name] OB: 11-10-1929  MR#: [id number]  SAY#:[id number]  Patient Care Team: Juline Patch, MD as PCP - General (Family Medicine) Kathrynn Ducking, MD (Neurology) Forest Gleason, MD as Consulting Physician (Unknown Physician Specialty) Carlena Bjornstad, MD as Consulting Physician (Cardiology) Seeplaputhur Robinette Haines, MD (General Surgery)  CHIEF COMPLAINT:  Cellulitis of lower extremity not responding to outpatient intravenous therapy  Oncology History   Chief Complaint/Problem List  1.  Autoimmune hemolytic anemia. Previously received Prednisone taper.   2.  Splenectomy in 5/02.  3.  Gastric ulcer diagnosed on endoscopy and multiple biopsies are pending, November 2005. 4.  Chronic lymphocytic leukemia. 5.vitamin B 12 deficiency 6.  Recent admission in the hospital in June of 2016 with hemolytic anemia and hemoglobin of 5.5 patient received blood transfusion was started on steroid 7.  Patient was started on rituximab (October 20, 2014     Chronic lymphocytic leukemia   08/01/2008 Initial Diagnosis Chronic lymphocytic leukemia    Oncology Flowsheet 11/20/2014 11/21/2014 11/22/2014 11/23/2014 11/24/2014 11/25/2014 11/26/2014  cyanocobalamin ((VITAMIN B-12)) IM - - - - - - -  enoxaparin (LOVENOX) [place] - - - 40 mg 40 mg 40 mg -  methylPREDNISolone sodium succinate 40 mg/mL (SOLU-MEDROL) IV - - - - - - -  predniSONE (DELTASONE) PO 10 _0  mg 10 mg  riTUXimab (RITUXAN) IV - - - - - - -  zolendronic acid (ZOMETA) IV - - - - - - -    INTERVAL HISTORY:  81 year old gentleman was admitted in the hospital with cellulitis of the left lower extremity. Patient was seen by surgical service as well as by infectious disease specialist On multiple antibiotics including Zosyn, vancomycin and clindamycin Significant improvement in the redness and pain CT scan has been done reviewed there is no evidence of  abscess  November 24, 2014 Patient continues to improve the redness is getting better.  Small area in the left thigh still shows induration.  Dr. Ola Spurr, infectious disease specialist as evaluated patient.  No chills.  No fever.  Patient was running low-grade fever and some abdominal discomfort and diarrhea clindamycin has been discontinued  November 25, 2014. Patient is doing well with significant improvement in redness.  Except for a small area of induration persists. Low-grade fever.  Hemoglobin is 10.1.  Patient is on vancomycin and Zosyn November 26, 2014 Patient is gradually improving.  Ultrasound done today by Dr. Dorena Cookey did not reveal any evidence of fluid collection or abscess.  As per discussion with infectious disease specialist patient has been be switched over to doxycycline and Augmentin.  IV as been discontinued. REVIEW OF SYSTEMS:    general status: Patient is feeling weak and tired.  No change in a performance status.  No chills.  No fever. HEENT:.  No evidence of stomatitis Lungs: No cough or shortness of breath Cardiac: No chest pain or paroxysmal nocturnal dyspnea GI: No nausea no vomiting no diarrhea no abdominal pain Skin: No rash Lower extremity redness and tendernesshas  Neurological system: No tingling.  No numbness.  No other focal signs Musculoskeletal system no bony pains Patient low-grade fever As per HPI. Otherwise, a complete review of systems is negatve.  PAST MEDICAL HISTORY: Past Medical History  Diagnosis Date  . CLL (chronic lymphoblastic leukemia) 2006    Rituxan treatment 2006  . Atrial septal aneurysm 2009  Insignificant, no, January, 2009  . Infection of PEG site     cellulitis.Marland KitchenResolved  . Vertebral compression fracture     multiple levels  . Herpes zoster     right chest  . Aortic insufficiency 2009    mild, Mild, echo, 2012  . Diastolic dysfunction     mild  . Chest pain     EF 55%, echo, October, 2009, possible inferior and  posterior borderline hypokinesis, patient has never  had cardiac catheterization  . Edema     EF 55%, echo, October, 2009, possible borderline inferior and posterior hypokinesis... heart catheterization has never been done(July 10, 2009)  . Myositis     Caused pulmonary insufficiency with muscle weakness in the past / Dr.Willis-neurology, Imuran and prednisone  . Calculus of kidney   . Inguinal hernia without mention of obstruction or gangrene, unilateral or unspecified, (not specified as recurrent)     right   . Unspecified gastritis and gastroduodenitis without mention of hemorrhage   . Abdominal pain, epigastric   . Nonspecific elevation of levels of transaminase or lactic acid dehydrogenase (LDH)   . Feeding difficulties and mismanagement   . Edema of male genital organs     Resolved, occurred during illness related to myositis  . Swelling of arm     left.Marland KitchenMarland KitchenResolved.... no DVT  . LFT elevation     Related to Imuran in the past... dose was adjusted  . Dyslipidemia     statins not used due to LFT abnormalities  . Shortness of breath     Breathing abnormalities related to muscle weakness from myositis  . RBBB (right bundle branch block)     Old  . Bradycardia     Sinus bradycardia, old  . Ejection fraction     EF 60%, echo, October, 2012  . Mitral regurgitation     Mild / moderate, echo, 2012  . Right ventricular dysfunction     Mild, echo, 2012  . Febrile illness     May, 2013, question sinusitis  . Polymyositis 11/26/2012    PAST SURGICAL HISTORY: Past Surgical History  Procedure Laterality Date  . Splenectomy    . Hernia repair      left side  . Peg placement  06/2007  . Peg tube removal  12/2007  . Kyphosis surgery      multilevel vertebral  . Odontoid fracture surgery      anterior screw fixation  . Nasal sinus surgery      x2  . Cataract extraction      bi lateral    FAMILY HISTORY Family History  Problem Relation Age of Onset  . Breast cancer Sister   .  Colon cancer Brother   . Prostate cancer Brother   . Stroke Mother   . Hypertension Mother   . Emphysema Father   . Diabetes Grandchild   . Heart attack Neg Hx   . Cancer Brother   . Cancer Sister   . Cancer Daughter     ADVANCED DIRECTIVES:  Patient does have advance healthcare directive, Patient   does not desire to make any changes  HEALTH MAINTENANCE: Social History  Substance Use Topics  . Smoking status: Former Smoker -- 0.50 packs/day for 31 years    Types: Cigarettes    Quit date: 06/29/1968  . Smokeless tobacco: Former Systems developer    Types: Chew  . Alcohol Use: No     Comment: rarely      Allergies  Allergen Reactions  .  Sulfonamide Derivatives       OBJECTIVE:  Filed Vitals:   11/26/14 1245  BP: 137/69  Pulse: 67  Temp: 97.9 F (36.6 C)  Resp: 22     Body mass index is 21.48 kg/(m^2).    ECOG FS:1 - Symptomatic but completely ambulatory  PHYSICAL EXAM: Gen. status: Patient is alert oriented not any acute distress Lymphatic system: Supraclavicular, cervical, axillary, inguinal lymph nodes are not palpable Head exam was generally normal. There was no scleral icterus or corneal arcus. Mucous membranes were moist. Cardiac: Tachycardia Abdominal exam revealed normal bowel sounds. The abdomen was soft, non-tender, and without masses, organomegaly, or appreciable enlargement of the abdominal aorta. Examination of the skin revealed no evidence of significant rashes, suspicious appearing nevi or other concerning lesions. Neurologically, the patient was awake, alert, and oriented to person, place and time. There were no obvious focal neurologic abnormalities. There is significant improvement in redness and pain. Kyphoscoliosis: Osteoporosis and multiple vertebral compression fracture  Skin: Grade 1 rash LAB RESULTS:  Admission on 11/19/2014  Component Date Value Ref Range Status  . Sodium 11/19/2014 135  135 - 145 mmol/L Final  . Potassium 11/19/2014 4.7  3.5 -  5.1 mmol/L Final  . Chloride 11/19/2014 103  101 - 111 mmol/L Final  . CO2 11/19/2014 26  22 - 32 mmol/L Final  . Glucose, Bld 11/19/2014 91  65 - 99 mg/dL Final  . BUN 11/19/2014 24* 6 - 20 mg/dL Final  . Creatinine, Ser 11/19/2014 0.72  0.61 - 1.24 mg/dL Final  . Calcium 11/19/2014 8.0* 8.9 - 10.3 mg/dL Final  . GFR calc non Af Amer 11/19/2014 >60  >60 mL/min Final  . GFR calc Af Amer 11/19/2014 >60  >60 mL/min Final   Comment: (NOTE) The eGFR has been calculated using the CKD EPI equation. This calculation has not been validated in all clinical situations. eGFR's persistently <60 mL/min signify possible Chronic Kidney Disease.   . Anion gap 11/19/2014 6  5 - 15 Final  . WBC 11/20/2014 8.9  3.8 - 10.6 K/uL Final  . RBC 11/20/2014 3.01* 4.40 - 5.90 MIL/uL Final  . Hemoglobin 11/20/2014 10.9* 13.0 - 18.0 g/dL Final  . HCT 11/20/2014 33.1* 40.0 - 52.0 % Final  . MCV 11/20/2014 109.7* 80.0 - 100.0 fL Final  . MCH 11/20/2014 36.3* 26.0 - 34.0 pg Final  . MCHC 11/20/2014 33.1  32.0 - 36.0 g/dL Final  . RDW 11/20/2014 16.1* 11.5 - 14.5 % Final  . Platelets 11/20/2014 331  150 - 440 K/uL Final  . Neutrophils Relative % 11/20/2014 75   Final  . Neutro Abs 11/20/2014 6.7* 1.4 - 6.5 K/uL Final  . Lymphocytes Relative 11/20/2014 13   Final  . Lymphs Abs 11/20/2014 1.1  1.0 - 3.6 K/uL Final  . Monocytes Relative 11/20/2014 12   Final  . Monocytes Absolute 11/20/2014 1.1* 0.2 - 1.0 K/uL Final  . Eosinophils Relative 11/20/2014 0   Final  . Eosinophils Absolute 11/20/2014 0.0  0 - 0.7 K/uL Final  . Basophils Relative 11/20/2014 0   Final  . Basophils Absolute 11/20/2014 0.0  0 - 0.1 K/uL Final  . WBC 11/21/2014 9.1  3.8 - 10.6 K/uL Final  . RBC 11/21/2014 2.78* 4.40 - 5.90 MIL/uL Final  . Hemoglobin 11/21/2014 10.1* 13.0 - 18.0 g/dL Final  . HCT 11/21/2014 30.5* 40.0 - 52.0 % Final  . MCV 11/21/2014 110.0* 80.0 - 100.0 fL Final  . MCH 11/21/2014 36.4* 26.0 -  34.0 pg Final  . MCHC  11/21/2014 33.1  32.0 - 36.0 g/dL Final  . RDW 11/21/2014 16.0* 11.5 - 14.5 % Final  . Platelets 11/21/2014 320  150 - 440 K/uL Final  . Neutrophils Relative % 11/21/2014 43   Final  . Neutro Abs 11/21/2014 3.9  1.4 - 6.5 K/uL Final  . Lymphocytes Relative 11/21/2014 42   Final  . Lymphs Abs 11/21/2014 3.9* 1.0 - 3.6 K/uL Final  . Monocytes Relative 11/21/2014 14   Final  . Monocytes Absolute 11/21/2014 1.2* 0.2 - 1.0 K/uL Final  . Eosinophils Relative 11/21/2014 0   Final  . Eosinophils Absolute 11/21/2014 0.0  0 - 0.7 K/uL Final  . Basophils Relative 11/21/2014 1   Final  . Basophils Absolute 11/21/2014 0.1  0 - 0.1 K/uL Final  . Sodium 11/21/2014 137  135 - 145 mmol/L Final  . Potassium 11/21/2014 4.1  3.5 - 5.1 mmol/L Final  . Chloride 11/21/2014 102  101 - 111 mmol/L Final  . CO2 11/21/2014 29  22 - 32 mmol/L Final  . Glucose, Bld 11/21/2014 116* 65 - 99 mg/dL Final  . BUN 11/21/2014 16  6 - 20 mg/dL Final  . Creatinine, Ser 11/21/2014 0.80  0.61 - 1.24 mg/dL Final  . Calcium 11/21/2014 8.6* 8.9 - 10.3 mg/dL Final  . Total Protein 11/21/2014 5.2* 6.5 - 8.1 g/dL Final  . Albumin 11/21/2014 2.5* 3.5 - 5.0 g/dL Final  . AST 11/21/2014 32  15 - 41 U/L Final  . ALT 11/21/2014 28  17 - 63 U/L Final  . Alkaline Phosphatase 11/21/2014 62  38 - 126 U/L Final  . Total Bilirubin 11/21/2014 0.9  0.3 - 1.2 mg/dL Final  . GFR calc non Af Amer 11/21/2014 >60  >60 mL/min Final  . GFR calc Af Amer 11/21/2014 >60  >60 mL/min Final   Comment: (NOTE) The eGFR has been calculated using the CKD EPI equation. This calculation has not been validated in all clinical situations. eGFR's persistently <60 mL/min signify possible Chronic Kidney Disease.   . Anion gap 11/21/2014 6  5 - 15 Final  . Vancomycin Tr 11/21/2014 25* 10 - 20 ug/mL Final   CRITICAL RESULT CALLED TO, READ BACK BY AND VERIFIED WITH JASON ROBBINS 11/21/2014 1613 L[facility]  . Creatinine, Ser 11/21/2014 0.87  0.61 - 1.24 mg/dL Final  . GFR  calc non Af Amer 11/21/2014 >60  >60 mL/min Final  . GFR calc Af Amer 11/21/2014 >60  >60 mL/min Final   Comment: (NOTE) The eGFR has been calculated using the CKD EPI equation. This calculation has not been validated in all clinical situations. eGFR's persistently <60 mL/min signify possible Chronic Kidney Disease.   . WBC 11/23/2014 8.0  3.8 - 10.6 K/uL Final  . RBC 11/23/2014 2.80* 4.40 - 5.90 MIL/uL Final  . Hemoglobin 11/23/2014 10.2* 13.0 - 18.0 g/dL Final  . HCT 11/23/2014 30.2* 40.0 - 52.0 % Final  . MCV 11/23/2014 107.7* 80.0 - 100.0 fL Final  . MCH 11/23/2014 36.5* 26.0 - 34.0 pg Final  . MCHC 11/23/2014 33.9  32.0 - 36.0 g/dL Final  . RDW 11/23/2014 15.8* 11.5 - 14.5 % Final  . Platelets 11/23/2014 395  150 - 440 K/uL Final  . Neutrophils Relative % 11/23/2014 40   Final  . Neutro Abs 11/23/2014 3.2  1.4 - 6.5 K/uL Final  . Lymphocytes Relative 11/23/2014 47   Final  . Lymphs Abs 11/23/2014 3.8* 1.0 - 3.6 K/uL Final  .  Monocytes Relative 11/23/2014 12   Final  . Monocytes Absolute 11/23/2014 1.0  0.2 - 1.0 K/uL Final  . Eosinophils Relative 11/23/2014 1   Final  . Eosinophils Absolute 11/23/2014 0.1  0 - 0.7 K/uL Final  . Basophils Relative 11/23/2014 0   Final  . Basophils Absolute 11/23/2014 0.0  0 - 0.1 K/uL Final  . Sodium 11/23/2014 138  135 - 145 mmol/L Final  . Potassium 11/23/2014 3.8  3.5 - 5.1 mmol/L Final  . Chloride 11/23/2014 102  101 - 111 mmol/L Final  . CO2 11/23/2014 29  22 - 32 mmol/L Final  . Glucose, Bld 11/23/2014 106* 65 - 99 mg/dL Final  . BUN 11/23/2014 13  6 - 20 mg/dL Final  . Creatinine, Ser 11/23/2014 0.87  0.61 - 1.24 mg/dL Final  . Calcium 11/23/2014 8.6* 8.9 - 10.3 mg/dL Final  . GFR calc non Af Amer 11/23/2014 >60  >60 mL/min Final  . GFR calc Af Amer 11/23/2014 >60  >60 mL/min Final   Comment: (NOTE) The eGFR has been calculated using the CKD EPI equation. This calculation has not been validated in all clinical situations. eGFR's  persistently <60 mL/min signify possible Chronic Kidney Disease.   . Anion gap 11/23/2014 7  5 - 15 Final  . Vancomycin Tr 11/23/2014 6* 10 - 20 ug/mL Final  . WBC 11/25/2014 10.3  3.8 - 10.6 K/uL Final  . RBC 11/25/2014 2.80* 4.40 - 5.90 MIL/uL Final  . Hemoglobin 11/25/2014 10.1* 13.0 - 18.0 g/dL Final  . HCT 11/25/2014 30.0* 40.0 - 52.0 % Final  . MCV 11/25/2014 107.2* 80.0 - 100.0 fL Final  . MCH 11/25/2014 36.1* 26.0 - 34.0 pg Final  . MCHC 11/25/2014 33.7  32.0 - 36.0 g/dL Final  . RDW 11/25/2014 15.8* 11.5 - 14.5 % Final  . Platelets 11/25/2014 448* 150 - 440 K/uL Final  . Neutrophils Relative % 11/25/2014 42   Final  . Neutro Abs 11/25/2014 4.3  1.4 - 6.5 K/uL Final  . Lymphocytes Relative 11/25/2014 44   Final  . Lymphs Abs 11/25/2014 4.6* 1.0 - 3.6 K/uL Final  . Monocytes Relative 11/25/2014 12   Final  . Monocytes Absolute 11/25/2014 1.2* 0.2 - 1.0 K/uL Final  . Eosinophils Relative 11/25/2014 1   Final  . Eosinophils Absolute 11/25/2014 0.1  0 - 0.7 K/uL Final  . Basophils Relative 11/25/2014 1   Final  . Basophils Absolute 11/25/2014 0.1  0 - 0.1 K/uL Final  . Creatinine, Ser 11/25/2014 0.72  0.61 - 1.24 mg/dL Final  . GFR calc non Af Amer 11/25/2014 >60  >60 mL/min Final  . GFR calc Af Amer 11/25/2014 >60  >60 mL/min Final   Comment: (NOTE) The eGFR has been calculated using the CKD EPI equation. This calculation has not been validated in all clinical situations. eGFR's persistently <60 mL/min signify possible Chronic Kidney Disease.   . Vancomycin Tr 11/25/2014 25* 10 - 20 ug/mL Final   Comment: CRITICAL RESULT CALLED TO, READ BACK BY AND VERIFIED WITH CHRISTINE KATSOUDAS AT 0831 ON 11/25/14.Marland KitchenMarland KitchenBuena   . Vancomycin Tr 11/25/2014 14  10 - 20 ug/mL Final      STUDIES: Ct Pelvis W Contrast  11/19/2014   CLINICAL DATA:  Pain, swelling and erythema in the left groin and thigh for 1 week. No acute injury or prior relevant surgery. History of chronic lymphocytic  leukemia.  EXAM: CT PELVIS AND BILATERAL LOWER EXTREMITIES WITH CONTRAST  TECHNIQUE: Multidetector CT imaging of  the pelvis and bilateral lower extremities was performed according to the standard protocol following bolus administration of intravenous contrast. Multiplanar CT image reconstructions were also generated.  CONTRAST:  110m OMNIPAQUE IOHEXOL 300 MG/ML  SOLN  COMPARISON:  Doppler ultrasound 11/15/2014. Abdominal CT 10/11/2014.  FINDINGS: CT PELVIS FINDINGS  No intrapelvic inflammatory changes or fluid collections demonstrated. There is moderate stool throughout the colon. The appendix appears normal.  There is aortoiliac atherosclerosis and moderate enlargement of the prostate gland. The bladder appears unremarkable. Bilateral scrotal hydroceles noted.  There are postsurgical changes in the left inguinal region without evidence of significant recurrent hernia.  The bones are demineralized without acute findings. 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 Patient is status post spinal augmentation at L4 and L5.  CT BILATERAL LOWER EXTREMITY FINDINGS  There is asymmetric enlargement of the left thigh with diffuse dermal thickening. There is asymmetric subcutaneous edema in the proximal thigh anteriorly and medially. More distally, this edema is more circumferential. No focal fluid collections are identified. Edema extends into the fascia within the posterior compartment. No intramuscular fluid collections identified.  There are probable reactive lymph nodes within the left groin. There is no significant hip joint effusion.  Diffuse vascular calcifications are noted throughout the femoral arteries. The left femoral vein is opacified and appears patent. There is limited contrast material within the right femoral vein. No DVT demonstrated.  There is no evidence of femoral bone destruction, acute fracture or dislocation.  IMPRESSION: 1. Findings are consistent with left thigh cellulitis and possible  fasciitis. No focal fluid collection to suggest abscess. 2. No evidence of osteomyelitis. 3. No significant intrapelvic inflammatory changes. 4. Diffuse atherosclerosis with asymmetric femoral vein enhancement (decreased on the right). This asymmetry may be secondary to hyperemia on the left. 5. Bilateral scrotal hydroceles.   Electronically Signed   By: WRichardean SaleM.D.   On: 11/19/2014 18:59   [place] Img Lower Unilateral Left  11/15/2014   CLINICAL DATA:  Left lower extremity edema and erythema for 4 days  EXAM: LEFT LOWER EXTREMITY VENOUS DOPPLER ULTRASOUND  TECHNIQUE: Gray-scale sonography with graded compression, as well as color Doppler and duplex ultrasound were performed to evaluate the lower extremity deep venous systems from the level of the common femoral vein and including the common femoral, femoral, profunda femoral, popliteal and calf veins including the posterior tibial, peroneal and gastrocnemius veins when visible. The superficial great saphenous vein was also interrogated. Spectral Doppler was utilized to evaluate flow at rest and with distal augmentation maneuvers in the common femoral, femoral and popliteal veins.  COMPARISON:  None.  FINDINGS: Contralateral Common Femoral Vein: Respiratory phasicity is normal and symmetric with the symptomatic side. No evidence of thrombus. Normal compressibility.  Common Femoral Vein: No evidence of thrombus. Normal compressibility, respiratory phasicity and response to augmentation.  Saphenofemoral Junction: No evidence of thrombus. Normal compressibility and flow on color Doppler imaging.  Profunda Femoral Vein: No evidence of thrombus. Normal compressibility and flow on color Doppler imaging.  Femoral Vein: No evidence of thrombus. Normal compressibility, respiratory phasicity and response to augmentation.  Popliteal Vein: No evidence of thrombus. Normal compressibility, respiratory phasicity and response to augmentation.  Calf Veins: No evidence  of thrombus. Normal compressibility and flow on color Doppler imaging.  Superficial Great Saphenous Vein: No evidence of thrombus. Normal compressibility and flow on color Doppler imaging.  Venous Reflux:  None.  Other Findings: Multiple prominent left inguinal lymph nodes the largest measuring 21 mm in greatest diameter.  These demonstrate fatty hila with no significant cortical thickening and are presumably reactive.  IMPRESSION: No evidence of deep venous thrombosis.   Electronically Signed   By: Skipper Cliche M.D.   On: 11/15/2014 12:34   [place] Extrem Low Left Ltd  11/22/2014   CLINICAL DATA:  Focal swelling in the left medial thigh extending posteriorly  EXAM: ULTRASOUND left LOWER EXTREMITY LIMITED  TECHNIQUE: Ultrasound examination of the lower extremity soft tissues was performed in the area of clinical concern.  COMPARISON:  11/19/2014  FINDINGS: Diffuse subcutaneous edema is noted similar to that seen on the recent CT examination. Scattered subcutaneous fluid is identified although no focal definitive abscess is seen.  IMPRESSION: Changes most consistent with cellulitis without definitive focal abscess.   Electronically Signed   By: Inez Catalina M.D.   On: 11/22/2014 20:26   Ct Extrem Lower W Cm Bil  11/19/2014   CLINICAL DATA:  Pain, swelling and erythema in the left groin and thigh for 1 week. No acute injury or prior relevant surgery. History of chronic lymphocytic leukemia.  EXAM: CT PELVIS AND BILATERAL LOWER EXTREMITIES WITH CONTRAST  TECHNIQUE: Multidetector CT imaging of the pelvis and bilateral lower extremities was performed according to the standard protocol following bolus administration of intravenous contrast. Multiplanar CT image reconstructions were also generated.  CONTRAST:  162m OMNIPAQUE IOHEXOL 300 MG/ML  SOLN  COMPARISON:  Doppler ultrasound 11/15/2014. Abdominal CT 10/11/2014.  FINDINGS: CT PELVIS FINDINGS  No intrapelvic inflammatory changes or fluid collections demonstrated.  There is moderate stool throughout the colon. The appendix appears normal.  There is aortoiliac atherosclerosis and moderate enlargement of the prostate gland. The bladder appears unremarkable. Bilateral scrotal hydroceles noted.  There are postsurgical changes in the left inguinal region without evidence of significant recurrent hernia.  The bones are demineralized without acute findings. 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 Patient is status post spinal augmentation at L4 and L5.  CT BILATERAL LOWER EXTREMITY FINDINGS  There is asymmetric enlargement of the left thigh with diffuse dermal thickening. There is asymmetric subcutaneous edema in the proximal thigh anteriorly and medially. More distally, this edema is more circumferential. No focal fluid collections are identified. Edema extends into the fascia within the posterior compartment. No intramuscular fluid collections identified.  There are probable reactive lymph nodes within the left groin. There is no significant hip joint effusion.  Diffuse vascular calcifications are noted throughout the femoral arteries. The left femoral vein is opacified and appears patent. There is limited contrast material within the right femoral vein. No DVT demonstrated.  There is no evidence of femoral bone destruction, acute fracture or dislocation.  IMPRESSION: 1. Findings are consistent with left thigh cellulitis and possible fasciitis. No focal fluid collection to suggest abscess. 2. No evidence of osteomyelitis. 3. No significant intrapelvic inflammatory changes. 4. Diffuse atherosclerosis with asymmetric femoral vein enhancement (decreased on the right). This asymmetry may be secondary to hyperemia on the left. 5. Bilateral scrotal hydroceles.   Electronically Signed   By: WRichardean SaleM.D.   On: 11/19/2014 18:59    ASSESSMENT: Autoimmune hemolytic anemia with lymphoproliferative disease He  is on prednisone 10 mg.  Patient is somewhat immunocompromised  because of prednisone as well as rituximab therapy. . Slow recovery from cellulitis Continue antibiotics Dr. SJamal Collinto evaluate ultrasound on a bedside and decide whether patient needs any aspiration or drainage Patient was encouraged for ambulation  November 26, 2014 Significant improvement. Ultrasound did not reveal any evidence of abscess  As discussed with infectious disease specialist patient will be switched over to antibiotic by mouth and in continue to follow this patient  MEDICAL DECISION MAKING:  Cellulitis of lower extremity Autoimmune hemolytic anemia.  Hemoglobin is 10.2 which is dropped and repeat CBC would be done.      No matching staging information was found for the patient.  Forest Gleason, MD   11/26/2014 7:30 PM

## 2014-11-26 NOTE — Plan of Care (Signed)
Problem: Discharge Progression Outcomes Goal: Discharge plan in place and appropriate Outcome: Progressing Individualization of Care Pt prefers to be called [name] [name] Hx of CLL, Atrial septal aneurysm, vertebral compression fractures, herpes zoster, aortic insufficiency, edema, myositis, SOB, RBBB. Admitted for cellulitis of the left leg.  1. Discharge plan:Home 2. Pain: Patient cited pain from wound 5/10. Norco admitted x1 for leg pain with stated relief upon reassessment. No complaints of headache or fever this shift. 3. Hemodynamics: Patient afebrile and VSS this shift. BP 131/58 mmHg  Pulse 61  Temp(Src) 98.7 F (37.1 C) (Oral)  Resp 18  Ht 5\' 6"  (1.676 m)  Wt 133 lb (60.328 kg)  BMI 21.48 kg/m2  SpO2 94% 4. Diet: Patient on regular diet and tolerating well.  5. Activity: Patient ambulating in room and up to bathroom during the evening. Patient also ambulated around unit this shift.  Gait steady and tolerating well. 6. Wound Improvement: Redness from groin to below knee is gradually resolving. Scrotum to foot edematous. Lower left leg swollen with +1 pitting edema.Foot and leg elevated on pillows this shift. Redness and swelling is slowly improving since previous night assessment. Patient continues on IV Zosyn and Vanco.

## 2014-11-27 DIAGNOSIS — R5383 Other fatigue: Secondary | ICD-10-CM

## 2014-11-27 DIAGNOSIS — R531 Weakness: Secondary | ICD-10-CM

## 2014-11-27 DIAGNOSIS — M419 Scoliosis, unspecified: Secondary | ICD-10-CM

## 2014-11-27 DIAGNOSIS — Z87891 Personal history of nicotine dependence: Secondary | ICD-10-CM

## 2014-11-27 LAB — CREATININE, SERUM
CREATININE: 0.75 mg/dL (ref 0.61–1.24)
GFR calc Af Amer: >60 mL/min (ref >60)
GFR calc non Af Amer: >60 mL/min (ref >60)

## 2014-11-27 MED ORDER — SODIUM CHLORIDE 0.9 % IJ SOLN
3.0000 mL | INTRAMUSCULAR | Status: DC | PRN
  Administered 2014-11-27: 22:00:00 3 mL via INTRAVENOUS
  Filled 2014-11-27: qty 10

## 2014-11-27 NOTE — Progress Notes (Signed)
[name], male   DOB: April 14, 1929, 81 y.o.   MRN: [id number] Pt seen last evening. No new complaints. Remains afebrile, VSS. Left upper thigh continues to improve slowly. No suggestion of abscess . [place] at bedside showed no loculated fluid. Feel he can be discharged on oral antibiotics and will see him in office for follow up in 1 week.

## 2014-11-27 NOTE — Plan of Care (Signed)
Problem: Discharge Progression Outcomes Goal: Barriers To Progression Addressed/Resolved Individualization  Outcome: Progressing Pt prefers to be called [name] [name] Hx of CLL, Atrial septal aneurysm, vertebral compression fractures, herpes zoster, aortic insufficiency, edema, myositis, SOB, RBBB.  Admitted for cellulitis of the left leg.    Goal: Other Discharge Outcomes/Goals Outcome: Progressing Pt in with cellulitis of the left groin area. IV access was loss so pt converted to po antibiotics since plans are for pt to be discharged on today. Family not pleased with the plan so NP [name] [name] was notified. Orders given via telephone read back to initiate IV antibiotics that were previously ordered and to discontinue po antibiotics. IV site established and IV antibiotics administered. Pt and family kept informed of the plan of care. Pt tolerating diet. Pt had Norco for pain which improved with the pain medication.

## 2014-11-27 NOTE — Progress Notes (Signed)
[name] [name] @ [facility] [phone]  [phone]     Greig Altergott OB: [birth year]  MR#: [id number]  QQI#:[id number]  Patient Care Team: Juline Patch, MD as PCP - General (Family Medicine) Kathrynn Ducking, MD (Neurology) Forest Gleason, MD as Consulting Physician (Unknown Physician Specialty) Carlena Bjornstad, MD as Consulting Physician (Cardiology) Seeplaputhur Robinette Haines, MD (General Surgery)  CHIEF COMPLAINT:  Cellulitis of lower extremity not responding to outpatient intravenous therapy  Oncology History   Chief Complaint/Problem List  1.  Autoimmune hemolytic anemia. Previously received Prednisone taper.   2.  Splenectomy in 5/02.  3.  Gastric ulcer diagnosed on endoscopy and multiple biopsies are pending, November 2005. 4.  Chronic lymphocytic leukemia. 5.vitamin B 12 deficiency 6.  Recent admission in the hospital in June of 2016 with hemolytic anemia and hemoglobin of 5.5 patient received blood transfusion was started on steroid 7.  Patient was started on rituximab (October 20, 2014     Chronic lymphocytic leukemia   08/01/2008 Initial Diagnosis Chronic lymphocytic leukemia    Oncology Flowsheet 11/21/2014 11/22/2014 11/23/2014 11/24/2014 11/25/2014 11/26/2014 11/27/2014  cyanocobalamin ((VITAMIN B-12)) IM - - - - - - -  enoxaparin (LOVENOX) Novice - - 40 mg 40 mg 40 mg - 40 mg  methylPREDNISolone sodium succinate 40 mg/mL (SOLU-MEDROL) IV - - - - - - -  predniSONE (DELTASONE) PO 10 mg 10 mg 10 mg 10 mg 10 mg 10 mg 10 mg  riTUXimab (RITUXAN) IV - - - - - - -  zolendronic acid (ZOMETA) IV - - - - - - -    INTERVAL HISTORY:  81 year old gentleman was admitted in the hospital with cellulitis of the left lower extremity. Patient was seen by surgical service as well as by infectious disease specialist On multiple antibiotics including Zosyn, vancomycin and clindamycin Significant improvement in the redness and pain CT scan has been done reviewed there is no evidence of  abscess  November 24, 2014 Patient continues to improve the redness is getting better.  Small area in the left thigh still shows induration.  Dr. Ola Spurr, infectious disease specialist as evaluated patient.  No chills.  No fever.  Patient was running low-grade fever and some abdominal discomfort and diarrhea clindamycin has been discontinued  November 25, 2014. Patient is doing well with significant improvement in redness.  Except for a small area of induration persists. Low-grade fever.  Hemoglobin is 10.1.  Patient is on vancomycin and Zosyn November 26, 2014 Patient is gradually improving.  Ultrasound done today by Dr. Dorena Cookey did not reveal any evidence of fluid collection or abscess.  As per discussion with infectious disease specialist patient has been be switched over to doxycycline and Augmentin.  IV as been discontinued. November 27, 2014  Patient was started back on IV antibiotic last night and this morning patient complained of some more redness No chills or fever.  Appetite has been stable. REVIEW OF SYSTEMS:    general status: Patient is feeling weak and tired.  No change in a performance status.  No chills.  No fever.  HEENT:.  No evidence of stomatitis Lungs: No cough or shortness of breath Cardiac: No chest pain or paroxysmal nocturnal dyspnea GI: No nausea no vomiting no diarrhea no abdominal pain Skin: No rash Lower extremity redness and tendernesshas  Neurological system: No tingling.  No numbness.  No other focal signs Musculoskeletal system no bony pains Patient low-grade fever As per HPI. Otherwise, a complete review of systems  is negatve.  PAST MEDICAL HISTORY: Past Medical History  Diagnosis Date  . CLL (chronic lymphoblastic leukemia) 2006    Rituxan treatment 2006  . Atrial septal aneurysm 2009    Insignificant, no, January, 2009  . Infection of PEG site     cellulitis.Marland KitchenResolved  . Vertebral compression fracture     multiple levels  . Herpes zoster      right chest  . Aortic insufficiency 2009    mild, Mild, echo, 2012  . Diastolic dysfunction     mild  . Chest pain     EF 55%, echo, October, 2009, possible inferior and posterior borderline hypokinesis, patient has never  had cardiac catheterization  . Edema     EF 55%, echo, October, 2009, possible borderline inferior and posterior hypokinesis... heart catheterization has never been done(July 10, 2009)  . Myositis     Caused pulmonary insufficiency with muscle weakness in the past / Dr.Willis-neurology, Imuran and prednisone  . Calculus of kidney   . Inguinal hernia without mention of obstruction or gangrene, unilateral or unspecified, (not specified as recurrent)     right   . Unspecified gastritis and gastroduodenitis without mention of hemorrhage   . Abdominal pain, epigastric   . Nonspecific elevation of levels of transaminase or lactic acid dehydrogenase (LDH)   . Feeding difficulties and mismanagement   . Edema of male genital organs     Resolved, occurred during illness related to myositis  . Swelling of arm     left.Marland KitchenMarland KitchenResolved.... no DVT  . LFT elevation     Related to Imuran in the past... dose was adjusted  . Dyslipidemia     statins not used due to LFT abnormalities  . Shortness of breath     Breathing abnormalities related to muscle weakness from myositis  . RBBB (right bundle branch block)     Old  . Bradycardia     Sinus bradycardia, old  . Ejection fraction     EF 60%, echo, October, 2012  . Mitral regurgitation     Mild / moderate, echo, 2012  . Right ventricular dysfunction     Mild, echo, 2012  . Febrile illness     May, 2013, question sinusitis  . Polymyositis 11/26/2012    PAST SURGICAL HISTORY: Past Surgical History  Procedure Laterality Date  . Splenectomy    . Hernia repair      left side  . Peg placement  06/2007  . Peg tube removal  12/2007  . Kyphosis surgery      multilevel vertebral  . Odontoid fracture surgery      anterior screw  fixation  . Nasal sinus surgery      x2  . Cataract extraction      bi lateral    FAMILY HISTORY Family History  Problem Relation Age of Onset  . Breast cancer Sister   . Colon cancer Brother   . Prostate cancer Brother   . Stroke Mother   . Hypertension Mother   . Emphysema Father   . Diabetes Grandchild   . Heart attack Neg Hx   . Cancer Brother   . Cancer Sister   . Cancer Daughter     ADVANCED DIRECTIVES:  Patient does have advance healthcare directive, Patient   does not desire to make any changes  HEALTH MAINTENANCE: Social History  Substance Use Topics  . Smoking status: Former Smoker -- 0.50 packs/day for 31 years    Types: Cigarettes    Quit  date: 06/29/1968  . Smokeless tobacco: Former Systems developer    Types: Chew  . Alcohol Use: No     Comment: rarely      Allergies  Allergen Reactions  . Sulfonamide Derivatives       OBJECTIVE:  Filed Vitals:   11/27/14 1258  BP: 138/67  Pulse: 71  Temp: 98.3 F (36.8 C)  Resp: 18     Body mass index is 21.48 kg/(m^2).    ECOG FS:1 - Symptomatic but completely ambulatory  PHYSICAL EXAM: Gen. status: Patient is alert oriented not any acute distress Lymphatic system: Supraclavicular, cervical, axillary, inguinal lymph nodes are not palpable Head exam was generally normal. There was no scleral icterus or corneal arcus. Mucous membranes were moist. Cardiac: Tachycardia Abdominal exam revealed normal bowel sounds. The abdomen was soft, non-tender, and without masses, organomegaly, or appreciable enlargement of the abdominal aorta. Examination of the skin revealed no evidence of significant rashes, suspicious appearing nevi or other concerning lesions. Neurologically, the patient was awake, alert, and oriented to person, place and time. There were no obvious focal neurologic abnormalities. There is significant improvement in redness and pain. Kyphoscoliosis: Osteoporosis and multiple vertebral compression  fracture  Skin: Grade 1 rash LAB RESULTS:  Admission on 11/19/2014  Component Date Value Ref Range Status  . Sodium 11/19/2014 135  135 - 145 mmol/L Final  . Potassium 11/19/2014 4.7  3.5 - 5.1 mmol/L Final  . Chloride 11/19/2014 103  101 - 111 mmol/L Final  . CO2 11/19/2014 26  22 - 32 mmol/L Final  . Glucose, Bld 11/19/2014 91  65 - 99 mg/dL Final  . BUN 11/19/2014 24* 6 - 20 mg/dL Final  . Creatinine, Ser 11/19/2014 0.72  0.61 - 1.24 mg/dL Final  . Calcium 11/19/2014 8.0* 8.9 - 10.3 mg/dL Final  . GFR calc non Af Amer 11/19/2014 >60  >60 mL/min Final  . GFR calc Af Amer 11/19/2014 >60  >60 mL/min Final   Comment: (NOTE) The eGFR has been calculated using the CKD EPI equation. This calculation has not been validated in all clinical situations. eGFR's persistently <60 mL/min signify possible Chronic Kidney Disease.   . Anion gap 11/19/2014 6  5 - 15 Final  . WBC 11/20/2014 8.9  3.8 - 10.6 K/uL Final  . RBC 11/20/2014 3.01* 4.40 - 5.90 MIL/uL Final  . Hemoglobin 11/20/2014 10.9* 13.0 - 18.0 g/dL Final  . HCT 11/20/2014 33.1* 40.0 - 52.0 % Final  . MCV 11/20/2014 109.7* 80.0 - 100.0 fL Final  . MCH 11/20/2014 36.3* 26.0 - 34.0 pg Final  . MCHC 11/20/2014 33.1  32.0 - 36.0 g/dL Final  . RDW 11/20/2014 16.1* 11.5 - 14.5 % Final  . Platelets 11/20/2014 331  150 - 440 K/uL Final  . Neutrophils Relative % 11/20/2014 75   Final  . Neutro Abs 11/20/2014 6.7* 1.4 - 6.5 K/uL Final  . Lymphocytes Relative 11/20/2014 13   Final  . Lymphs Abs 11/20/2014 1.1  1.0 - 3.6 K/uL Final  . Monocytes Relative 11/20/2014 12   Final  . Monocytes Absolute 11/20/2014 1.1* 0.2 - 1.0 K/uL Final  . Eosinophils Relative 11/20/2014 0   Final  . Eosinophils Absolute 11/20/2014 0.0  0 - 0.7 K/uL Final  . Basophils Relative 11/20/2014 0   Final  . Basophils Absolute 11/20/2014 0.0  0 - 0.1 K/uL Final  . WBC 11/21/2014 9.1  3.8 - 10.6 K/uL Final  . RBC 11/21/2014 2.78* 4.40 - 5.90 MIL/uL Final  .  Hemoglobin 11/21/2014 10.1* 13.0 - 18.0 g/dL Final  . HCT 11/21/2014 30.5* 40.0 - 52.0 % Final  . MCV 11/21/2014 110.0* 80.0 - 100.0 fL Final  . MCH 11/21/2014 36.4* 26.0 - 34.0 pg Final  . MCHC 11/21/2014 33.1  32.0 - 36.0 g/dL Final  . RDW 11/21/2014 16.0* 11.5 - 14.5 % Final  . Platelets 11/21/2014 320  150 - 440 K/uL Final  . Neutrophils Relative % 11/21/2014 43   Final  . Neutro Abs 11/21/2014 3.9  1.4 - 6.5 K/uL Final  . Lymphocytes Relative 11/21/2014 42   Final  . Lymphs Abs 11/21/2014 3.9* 1.0 - 3.6 K/uL Final  . Monocytes Relative 11/21/2014 14   Final  . Monocytes Absolute 11/21/2014 1.2* 0.2 - 1.0 K/uL Final  . Eosinophils Relative 11/21/2014 0   Final  . Eosinophils Absolute 11/21/2014 0.0  0 - 0.7 K/uL Final  . Basophils Relative 11/21/2014 1   Final  . Basophils Absolute 11/21/2014 0.1  0 - 0.1 K/uL Final  . Sodium 11/21/2014 137  135 - 145 mmol/L Final  . Potassium 11/21/2014 4.1  3.5 - 5.1 mmol/L Final  . Chloride 11/21/2014 102  101 - 111 mmol/L Final  . CO2 11/21/2014 29  22 - 32 mmol/L Final  . Glucose, Bld 11/21/2014 116* 65 - 99 mg/dL Final  . BUN 11/21/2014 16  6 - 20 mg/dL Final  . Creatinine, Ser 11/21/2014 0.80  0.61 - 1.24 mg/dL Final  . Calcium 11/21/2014 8.6* 8.9 - 10.3 mg/dL Final  . Total Protein 11/21/2014 5.2* 6.5 - 8.1 g/dL Final  . Albumin 11/21/2014 2.5* 3.5 - 5.0 g/dL Final  . AST 11/21/2014 32  15 - 41 U/L Final  . ALT 11/21/2014 28  17 - 63 U/L Final  . Alkaline Phosphatase 11/21/2014 62  38 - 126 U/L Final  . Total Bilirubin 11/21/2014 0.9  0.3 - 1.2 mg/dL Final  . GFR calc non Af Amer 11/21/2014 >60  >60 mL/min Final  . GFR calc Af Amer 11/21/2014 >60  >60 mL/min Final   Comment: (NOTE) The eGFR has been calculated using the CKD EPI equation. This calculation has not been validated in all clinical situations. eGFR's persistently <60 mL/min signify possible Chronic Kidney Disease.   . Anion gap 11/21/2014 6  5 - 15 Final  . Vancomycin Tr  11/21/2014 25* 10 - 20 ug/mL Final   CRITICAL RESULT CALLED TO, READ BACK BY AND VERIFIED WITH JASON ROBBINS 11/21/2014 1613 LKH  . Creatinine, Ser 11/21/2014 0.87  0.61 - 1.24 mg/dL Final  . GFR calc non Af Amer 11/21/2014 >60  >60 mL/min Final  . GFR calc Af Amer 11/21/2014 >60  >60 mL/min Final   Comment: (NOTE) The eGFR has been calculated using the CKD EPI equation. This calculation has not been validated in all clinical situations. eGFR's persistently <60 mL/min signify possible Chronic Kidney Disease.   . WBC 11/23/2014 8.0  3.8 - 10.6 K/uL Final  . RBC 11/23/2014 2.80* 4.40 - 5.90 MIL/uL Final  . Hemoglobin 11/23/2014 10.2* 13.0 - 18.0 g/dL Final  . HCT 11/23/2014 30.2* 40.0 - 52.0 % Final  . MCV 11/23/2014 107.7* 80.0 - 100.0 fL Final  . MCH 11/23/2014 36.5* 26.0 - 34.0 pg Final  . MCHC 11/23/2014 33.9  32.0 - 36.0 g/dL Final  . RDW 11/23/2014 15.8* 11.5 - 14.5 % Final  . Platelets 11/23/2014 395  150 - 440 K/uL Final  . Neutrophils Relative % 11/23/2014 40  Final  . Neutro Abs 11/23/2014 3.2  1.4 - 6.5 K/uL Final  . Lymphocytes Relative 11/23/2014 47   Final  . Lymphs Abs 11/23/2014 3.8* 1.0 - 3.6 K/uL Final  . Monocytes Relative 11/23/2014 12   Final  . Monocytes Absolute 11/23/2014 1.0  0.2 - 1.0 K/uL Final  . Eosinophils Relative 11/23/2014 1   Final  . Eosinophils Absolute 11/23/2014 0.1  0 - 0.7 K/uL Final  . Basophils Relative 11/23/2014 0   Final  . Basophils Absolute 11/23/2014 0.0  0 - 0.1 K/uL Final  . Sodium 11/23/2014 138  135 - 145 mmol/L Final  . Potassium 11/23/2014 3.8  3.5 - 5.1 mmol/L Final  . Chloride 11/23/2014 102  101 - 111 mmol/L Final  . CO2 11/23/2014 29  22 - 32 mmol/L Final  . Glucose, Bld 11/23/2014 106* 65 - 99 mg/dL Final  . BUN 11/23/2014 13  6 - 20 mg/dL Final  . Creatinine, Ser 11/23/2014 0.87  0.61 - 1.24 mg/dL Final  . Calcium 11/23/2014 8.6* 8.9 - 10.3 mg/dL Final  . GFR calc non Af Amer 11/23/2014 >60  >60 mL/min Final  . GFR calc  Af Amer 11/23/2014 >60  >60 mL/min Final   Comment: (NOTE) The eGFR has been calculated using the CKD EPI equation. This calculation has not been validated in all clinical situations. eGFR's persistently <60 mL/min signify possible Chronic Kidney Disease.   . Anion gap 11/23/2014 7  5 - 15 Final  . Vancomycin Tr 11/23/2014 6* 10 - 20 ug/mL Final  . WBC 11/25/2014 10.3  3.8 - 10.6 K/uL Final  . RBC 11/25/2014 2.80* 4.40 - 5.90 MIL/uL Final  . Hemoglobin 11/25/2014 10.1* 13.0 - 18.0 g/dL Final  . HCT 11/25/2014 30.0* 40.0 - 52.0 % Final  . MCV 11/25/2014 107.2* 80.0 - 100.0 fL Final  . MCH 11/25/2014 36.1* 26.0 - 34.0 pg Final  . MCHC 11/25/2014 33.7  32.0 - 36.0 g/dL Final  . RDW 11/25/2014 15.8* 11.5 - 14.5 % Final  . Platelets 11/25/2014 448* 150 - 440 K/uL Final  . Neutrophils Relative % 11/25/2014 42   Final  . Neutro Abs 11/25/2014 4.3  1.4 - 6.5 K/uL Final  . Lymphocytes Relative 11/25/2014 44   Final  . Lymphs Abs 11/25/2014 4.6* 1.0 - 3.6 K/uL Final  . Monocytes Relative 11/25/2014 12   Final  . Monocytes Absolute 11/25/2014 1.2* 0.2 - 1.0 K/uL Final  . Eosinophils Relative 11/25/2014 1   Final  . Eosinophils Absolute 11/25/2014 0.1  0 - 0.7 K/uL Final  . Basophils Relative 11/25/2014 1   Final  . Basophils Absolute 11/25/2014 0.1  0 - 0.1 K/uL Final  . Creatinine, Ser 11/25/2014 0.72  0.61 - 1.24 mg/dL Final  . GFR calc non Af Amer 11/25/2014 >60  >60 mL/min Final  . GFR calc Af Amer 11/25/2014 >60  >60 mL/min Final   Comment: (NOTE) The eGFR has been calculated using the CKD EPI equation. This calculation has not been validated in all clinical situations. eGFR's persistently <60 mL/min signify possible Chronic Kidney Disease.   . Vancomycin Tr 11/25/2014 25* 10 - 20 ug/mL Final   Comment: CRITICAL RESULT CALLED TO, READ BACK BY AND VERIFIED WITH CHRISTINE KATSOUDAS AT 0831 ON 11/25/14.Marland KitchenMarland KitchenInwood   . Vancomycin Tr 11/25/2014 14  10 - 20 ug/mL Final  . Creatinine, Ser  11/27/2014 0.75  0.61 - 1.24 mg/dL Final  . GFR calc non Af Amer 11/27/2014 >60  >60  mL/min Final  . GFR calc Af Amer 11/27/2014 >60  >60 mL/min Final   Comment: (NOTE) The eGFR has been calculated using the CKD EPI equation. This calculation has not been validated in all clinical situations. eGFR's persistently <60 mL/min signify possible Chronic Kidney Disease.       STUDIES: Ct Pelvis W Contrast  11/19/2014   CLINICAL DATA:  Pain, swelling and erythema in the left groin and thigh for 1 week. No acute injury or prior relevant surgery. History of chronic lymphocytic leukemia.  EXAM: CT PELVIS AND BILATERAL LOWER EXTREMITIES WITH CONTRAST  TECHNIQUE: Multidetector CT imaging of the pelvis and bilateral lower extremities was performed according to the standard protocol following bolus administration of intravenous contrast. Multiplanar CT image reconstructions were also generated.  CONTRAST:  19m OMNIPAQUE IOHEXOL 300 MG/ML  SOLN  COMPARISON:  Doppler ultrasound 11/15/2014. Abdominal CT 10/11/2014.  FINDINGS: CT PELVIS FINDINGS  No intrapelvic inflammatory changes or fluid collections demonstrated. There is moderate stool throughout the colon. The appendix appears normal.  There is aortoiliac atherosclerosis and moderate enlargement of the prostate gland. The bladder appears unremarkable. Bilateral scrotal hydroceles noted.  There are postsurgical changes in the left inguinal region without evidence of significant recurrent hernia.  The bones are demineralized without acute findings. 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 Patient is status post spinal augmentation at L4 and L5.  CT BILATERAL LOWER EXTREMITY FINDINGS  There is asymmetric enlargement of the left thigh with diffuse dermal thickening. There is asymmetric subcutaneous edema in the proximal thigh anteriorly and medially. More distally, this edema is more circumferential. No focal fluid collections are identified. Edema extends  into the fascia within the posterior compartment. No intramuscular fluid collections identified.  There are probable reactive lymph nodes within the left groin. There is no significant hip joint effusion.  Diffuse vascular calcifications are noted throughout the femoral arteries. The left femoral vein is opacified and appears patent. There is limited contrast material within the right femoral vein. No DVT demonstrated.  There is no evidence of femoral bone destruction, acute fracture or dislocation.  IMPRESSION: 1. Findings are consistent with left thigh cellulitis and possible fasciitis. No focal fluid collection to suggest abscess. 2. No evidence of osteomyelitis. 3. No significant intrapelvic inflammatory changes. 4. Diffuse atherosclerosis with asymmetric femoral vein enhancement (decreased on the right). This asymmetry may be secondary to hyperemia on the left. 5. Bilateral scrotal hydroceles.   Electronically Signed   By: WRichardean SaleM.D.   On: 11/19/2014 18:59   [place]ous Img Lower Unilateral Left  11/15/2014   CLINICAL DATA:  Left lower extremity edema and erythema for 4 days  EXAM: LEFT LOWER EXTREMITY VENOUS DOPPLER ULTRASOUND  TECHNIQUE: Gray-scale sonography with graded compression, as well as color Doppler and duplex ultrasound were performed to evaluate the lower extremity deep venous systems from the level of the common femoral vein and including the common femoral, femoral, profunda femoral, popliteal and calf veins including the posterior tibial, peroneal and gastrocnemius veins when visible. The superficial great saphenous vein was also interrogated. Spectral Doppler was utilized to evaluate flow at rest and with distal augmentation maneuvers in the common femoral, femoral and popliteal veins.  COMPARISON:  None.  FINDINGS: Contralateral Common Femoral Vein: Respiratory phasicity is normal and symmetric with the symptomatic side. No evidence of thrombus. Normal compressibility.  Common  Femoral Vein: No evidence of thrombus. Normal compressibility, respiratory phasicity and response to augmentation.  Saphenofemoral Junction: No evidence of thrombus. Normal compressibility  and flow on color Doppler imaging.  Profunda Femoral Vein: No evidence of thrombus. Normal compressibility and flow on color Doppler imaging.  Femoral Vein: No evidence of thrombus. Normal compressibility, respiratory phasicity and response to augmentation.  Popliteal Vein: No evidence of thrombus. Normal compressibility, respiratory phasicity and response to augmentation.  Calf Veins: No evidence of thrombus. Normal compressibility and flow on color Doppler imaging.  Superficial Great Saphenous Vein: No evidence of thrombus. Normal compressibility and flow on color Doppler imaging.  Venous Reflux:  None.  Other Findings: Multiple prominent left inguinal lymph nodes the largest measuring 21 mm in greatest diameter. These demonstrate fatty hila with no significant cortical thickening and are presumably reactive.  IMPRESSION: No evidence of deep venous thrombosis.   Electronically Signed   By: Skipper Cliche M.D.   On: 11/15/2014 12:34   [place] Extrem Low Left Ltd  11/22/2014   CLINICAL DATA:  Focal swelling in the left medial thigh extending posteriorly  EXAM: ULTRASOUND left LOWER EXTREMITY LIMITED  TECHNIQUE: Ultrasound examination of the lower extremity soft tissues was performed in the area of clinical concern.  COMPARISON:  11/19/2014  FINDINGS: Diffuse subcutaneous edema is noted similar to that seen on the recent CT examination. Scattered subcutaneous fluid is identified although no focal definitive abscess is seen.  IMPRESSION: Changes most consistent with cellulitis without definitive focal abscess.   Electronically Signed   By: Inez Catalina M.D.   On: 11/22/2014 20:26   Ct Extrem Lower W Cm Bil  11/19/2014   CLINICAL DATA:  Pain, swelling and erythema in the left groin and thigh for 1 week. No acute injury or prior  relevant surgery. History of chronic lymphocytic leukemia.  EXAM: CT PELVIS AND BILATERAL LOWER EXTREMITIES WITH CONTRAST  TECHNIQUE: Multidetector CT imaging of the pelvis and bilateral lower extremities was performed according to the standard protocol following bolus administration of intravenous contrast. Multiplanar CT image reconstructions were also generated.  CONTRAST:  123m OMNIPAQUE IOHEXOL 300 MG/ML  SOLN  COMPARISON:  Doppler ultrasound 11/15/2014. Abdominal CT 10/11/2014.  FINDINGS: CT PELVIS FINDINGS  No intrapelvic inflammatory changes or fluid collections demonstrated. There is moderate stool throughout the colon. The appendix appears normal.  There is aortoiliac atherosclerosis and moderate enlargement of the prostate gland. The bladder appears unremarkable. Bilateral scrotal hydroceles noted.  There are postsurgical changes in the left inguinal region without evidence of significant recurrent hernia.  The bones are demineralized without acute findings. 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 Patient is status post spinal augmentation at L4 and L5.  CT BILATERAL LOWER EXTREMITY FINDINGS  There is asymmetric enlargement of the left thigh with diffuse dermal thickening. There is asymmetric subcutaneous edema in the proximal thigh anteriorly and medially. More distally, this edema is more circumferential. No focal fluid collections are identified. Edema extends into the fascia within the posterior compartment. No intramuscular fluid collections identified.  There are probable reactive lymph nodes within the left groin. There is no significant hip joint effusion.  Diffuse vascular calcifications are noted throughout the femoral arteries. The left femoral vein is opacified and appears patent. There is limited contrast material within the right femoral vein. No DVT demonstrated.  There is no evidence of femoral bone destruction, acute fracture or dislocation.  IMPRESSION: 1. Findings are  consistent with left thigh cellulitis and possible fasciitis. No focal fluid collection to suggest abscess. 2. No evidence of osteomyelitis. 3. No significant intrapelvic inflammatory changes. 4. Diffuse atherosclerosis with asymmetric femoral vein enhancement (decreased on  the right). This asymmetry may be secondary to hyperemia on the left. 5. Bilateral scrotal hydroceles.   Electronically Signed   By: Richardean Sale M.D.   On: 11/19/2014 18:59    ASSESSMENT: Autoimmune hemolytic anemia with lymphoproliferative disease He  is on prednisone 10 mg.  Patient is somewhat immunocompromised because of prednisone as well as rituximab therapy. . Slow recovery from cellulitis Continue antibiotics Dr. Jamal Collin to evaluate ultrasound on a bedside and decide whether patient needs any aspiration or drainage Patient was encouraged for ambulation  November 26, 2014 Significant improvement. Ultrasound did not reveal any evidence of abscess  As discussed with infectious disease specialist patient will be switched over to antibiotic by mouth and in continue to follow this patient November 27, 2014 Slow improvement.  Continue IV antibiotics today but tomorrow may be switched over to by mouth antibiotics and patient can be discharged I will see patient in 1 week in between patient will be seen by Dr. Emilee Hero  MEDICAL DECISION MAKING:  Cellulitis of lower extremity Autoimmune hemolytic anemia.  Hemoglobin is 10.2 which is dropped and repeat CBC would be done.      No matching staging information was found for the patient.  Forest Gleason, MD   11/27/2014 7:07 PM

## 2014-11-27 NOTE — Plan of Care (Signed)
Problem: Discharge Progression Outcomes Goal: Other Discharge Outcomes/Goals Outcome: Progressing Plan of Care Progress to Goal:  Pt needing 1 more day of IV abx.  Will go home on oral abx.  Left thigh area still pinkish/red, swollen, hard. There is no abscess pocket - so nothing to be drained.  Had Norco for pain 1x.  Pt will f/u w/Dr. Jamal Collin in 1wk.  Pt is sharp and ambulates around nurse's stn w/rolling walker and is steady ambulating in his room. Possible d/ce tomorrow.

## 2014-11-28 MED ORDER — AMOXICILLIN-POT CLAVULANATE 875-125 MG PO TABS: 1.0000 | ORAL_TABLET | Freq: Two times a day (BID) | ORAL | Status: DC

## 2014-11-28 MED ORDER — DOXYCYCLINE HYCLATE 100 MG PO TABS: 100.0000 mg | ORAL_TABLET | Freq: Two times a day (BID) | ORAL | Status: DC

## 2014-11-28 MED ORDER — PREDNISONE 20 MG PO TABS: 10.0000 mg | ORAL_TABLET | Freq: Every day | ORAL | Status: DC

## 2014-11-28 MED ORDER — PANTOPRAZOLE SODIUM 40 MG PO TBEC: 40.0000 mg | DELAYED_RELEASE_TABLET | Freq: Every day | ORAL | Status: DC

## 2014-11-28 NOTE — Plan of Care (Signed)
Problem: Discharge Progression Outcomes Goal: Discharge plan in place and appropriate Outcome: Progressing Pt prefers to be called [name] [name] Hx of CLL, Atrial septal aneurysm, vertebral compression fractures, herpes zoster, aortic insufficiency, edema, myositis, SOB, RBBB.  Admitted for cellulitis of the left leg.     Goal: Other Discharge Outcomes/Goals Outcome: Progressing Assumed care at 2300. Medical dx of cellulitis to left groin and left inner thigh. Improving- pink, hard and warm to touch. Pt on IV antibiotics currently but the plan is for discharge to home on oral antibiotics today. Pt ambulated x 2 around the nurses station with standby assist. No c/o pain this shift.

## 2014-11-28 NOTE — Care Management Important Message (Signed)
Important Message  Patient Details  Name: [name] [name] MRN: [id number] Date of Birth: [birth year]   Medicare Important Message Given:  Yes-second notification given    Juliann Pulse A Allmond 11/28/2014, 10:10 AM

## 2014-11-28 NOTE — Progress Notes (Signed)
ANTIBIOTIC CONSULT NOTE - INITIAL  Pharmacy Consult for Vancomycin and Zosyn  Indication: cellulitis  Allergies  Allergen Reactions  . Sulfonamide Derivatives     Patient Measurements: Height: 5\' 6"  (167.6 cm) Weight: 133 lb (60.328 kg) IBW/kg (Calculated) : 63.8 Adjusted Body Weight:   Vital Signs: Temp: 98.1 F (36.7 C) (08/19 0455) Temp Source: Oral (08/19 0455) BP: 135/64 mmHg (08/19 0455) Pulse Rate: 59 (08/19 0455)  Labs:  Recent Labs  11/27/14 0409  CREATININE 0.75   Estimated Creatinine Clearance: 60.7 mL/min (by C-G formula based on Cr of 0.75).  Recent Labs  11/25/14 1603  [facility] 14     Microbiology: Recent Results (from the past 720 hour(s))  Blood culture (routine x 2)     Status: None   Collection Time: 11/15/14 10:34 AM  Result Value Ref Range Status   Specimen Description BLOOD RIGHT ASSIST CONTROL  Final   Special Requests BOTTLES DRAWN AEROBIC AND ANAEROBIC  1CC  Final   Culture NO GROWTH 5 DAYS  Final   Report Status 11/20/2014 FINAL  Final  Blood culture (routine x 2)     Status: None   Collection Time: 11/15/14 10:41 AM  Result Value Ref Range Status   Specimen Description BLOOD LEFT ASSIST CONTROL  Final   Special Requests BOTTLES DRAWN AEROBIC AND ANAEROBIC  4CC  Final   Culture NO GROWTH 5 DAYS  Final   Report Status 11/20/2014 FINAL  Final     Assessment: Pharmacy consulted to dose vancomycin for cellulitis in this 81 year old male. Patient currently on vancomycin and zoysn, ID following.    Vancomycin therapeutic, renal function stable. Blood cultures with no growth, no evidence of abscess. Per MD note planning to switch to oral atibiotics for discharge in the next day or so.   Goal of Therapy:  Target trough: 10-6mcg/ml for cellulitis  Plan:  Will continue current orders for vancomycin 750mg  IV Q12H.   Will recheck trough on Monday if patient still here/on vancomycin. Continue to follow renal function.  Pharmacy to  follow per consult.   Rexene Edison, PharmD Clinical Pharmacist  11/28/2014,12:18 PM

## 2014-11-28 NOTE — Plan of Care (Signed)
Problem: Discharge Progression Outcomes Goal: Other Discharge Outcomes/Goals Outcome: Progressing Plan of Care Progress to Goal:  Pt rec'd IV zosyn and vanc today.  Now ready to go home.  Pt d/ced home.  Removed IV and reviewed d/c instructions including new scripts.   Gave education on cellulitis - call dr if you start running fever or if area expands.  Keep LLE elevated and apply warm cloth 4x day.  Confident pt will be compliant w/PO abx.  Will f/u w/Dr Jamal Collin in 1 wk.

## 2014-12-01 ENCOUNTER — Other Ambulatory Visit: Payer: Self-pay | Admitting: Family Medicine

## 2014-12-01 ENCOUNTER — Telehealth: Payer: Self-pay | Admitting: *Deleted

## 2014-12-01 DIAGNOSIS — D594 Other nonautoimmune hemolytic anemias: Secondary | ICD-10-CM

## 2014-12-01 DIAGNOSIS — C9110 Chronic lymphocytic leukemia of B-cell type not having achieved remission: Secondary | ICD-10-CM

## 2014-12-01 DIAGNOSIS — D479 Neoplasm of uncertain behavior of lymphoid, hematopoietic and related tissue, unspecified: Secondary | ICD-10-CM

## 2014-12-01 MED ORDER — PREDNISONE 20 MG PO TABS: 10.0000 mg | ORAL_TABLET | Freq: Every day | ORAL | Status: DC

## 2014-12-01 NOTE — Telephone Encounter (Signed)
Cannot see Dr Jamal Collin until Thursday at 3:30 and his appt with Choksi is before that so he is thinking about cancelling Sankars appt and see if Choksi wants to change plans regarding surgical consult.  He did not get his Prednisone rx when he was discharged and it is in as a No Print

## 2014-12-04 ENCOUNTER — Inpatient Hospital Stay: Payer: Medicare Other

## 2014-12-04 ENCOUNTER — Encounter: Payer: Self-pay | Admitting: Oncology

## 2014-12-04 ENCOUNTER — Ambulatory Visit: Payer: Medicare Other | Admitting: General Surgery

## 2014-12-04 ENCOUNTER — Inpatient Hospital Stay (HOSPITAL_BASED_OUTPATIENT_CLINIC_OR_DEPARTMENT_OTHER): Payer: Medicare Other | Admitting: Oncology

## 2014-12-04 VITALS — BP 150/66 | HR 45 | Temp 96.1°F | Wt 129.0 lb

## 2014-12-04 DIAGNOSIS — I340 Nonrheumatic mitral (valve) insufficiency: Secondary | ICD-10-CM

## 2014-12-04 DIAGNOSIS — Z803 Family history of malignant neoplasm of breast: Secondary | ICD-10-CM | POA: Diagnosis not present

## 2014-12-04 DIAGNOSIS — I4510 Unspecified right bundle-branch block: Secondary | ICD-10-CM | POA: Diagnosis not present

## 2014-12-04 DIAGNOSIS — Z800 Family history of malignant neoplasm of digestive organs: Secondary | ICD-10-CM

## 2014-12-04 DIAGNOSIS — I253 Aneurysm of heart: Secondary | ICD-10-CM

## 2014-12-04 DIAGNOSIS — Z5111 Encounter for antineoplastic chemotherapy: Secondary | ICD-10-CM | POA: Diagnosis not present

## 2014-12-04 DIAGNOSIS — Z809 Family history of malignant neoplasm, unspecified: Secondary | ICD-10-CM | POA: Diagnosis not present

## 2014-12-04 DIAGNOSIS — Z9081 Acquired absence of spleen: Secondary | ICD-10-CM | POA: Diagnosis not present

## 2014-12-04 DIAGNOSIS — Z79899 Other long term (current) drug therapy: Secondary | ICD-10-CM

## 2014-12-04 DIAGNOSIS — I2510 Atherosclerotic heart disease of native coronary artery without angina pectoris: Secondary | ICD-10-CM

## 2014-12-04 DIAGNOSIS — C9110 Chronic lymphocytic leukemia of B-cell type not having achieved remission: Secondary | ICD-10-CM

## 2014-12-04 DIAGNOSIS — D72829 Elevated white blood cell count, unspecified: Secondary | ICD-10-CM | POA: Diagnosis not present

## 2014-12-04 DIAGNOSIS — R0602 Shortness of breath: Secondary | ICD-10-CM | POA: Diagnosis not present

## 2014-12-04 DIAGNOSIS — Z8711 Personal history of peptic ulcer disease: Secondary | ICD-10-CM

## 2014-12-04 DIAGNOSIS — R5383 Other fatigue: Secondary | ICD-10-CM

## 2014-12-04 DIAGNOSIS — R000 Tachycardia, unspecified: Secondary | ICD-10-CM

## 2014-12-04 DIAGNOSIS — R531 Weakness: Secondary | ICD-10-CM | POA: Diagnosis not present

## 2014-12-04 DIAGNOSIS — Z7982 Long term (current) use of aspirin: Secondary | ICD-10-CM | POA: Diagnosis not present

## 2014-12-04 DIAGNOSIS — Z87442 Personal history of urinary calculi: Secondary | ICD-10-CM | POA: Diagnosis not present

## 2014-12-04 DIAGNOSIS — D591 Other autoimmune hemolytic anemias: Secondary | ICD-10-CM | POA: Diagnosis not present

## 2014-12-04 DIAGNOSIS — L03116 Cellulitis of left lower limb: Secondary | ICD-10-CM

## 2014-12-04 DIAGNOSIS — Z8042 Family history of malignant neoplasm of prostate: Secondary | ICD-10-CM | POA: Diagnosis not present

## 2014-12-04 DIAGNOSIS — E785 Hyperlipidemia, unspecified: Secondary | ICD-10-CM | POA: Diagnosis not present

## 2014-12-04 DIAGNOSIS — E538 Deficiency of other specified B group vitamins: Secondary | ICD-10-CM

## 2014-12-04 DIAGNOSIS — Z87891 Personal history of nicotine dependence: Secondary | ICD-10-CM | POA: Diagnosis not present

## 2014-12-04 DIAGNOSIS — R21 Rash and other nonspecific skin eruption: Secondary | ICD-10-CM

## 2014-12-04 DIAGNOSIS — I351 Nonrheumatic aortic (valve) insufficiency: Secondary | ICD-10-CM | POA: Diagnosis not present

## 2014-12-04 LAB — BASIC METABOLIC PANEL
ANION GAP: 3 — AB (ref 5–15)
BUN: 17 mg/dL (ref 6–20)
CALCIUM: 8.5 mg/dL — AB (ref 8.9–10.3)
CO2: 30 mmol/L (ref 22–32)
Chloride: 100 mmol/L — ABNORMAL LOW (ref 101–111)
Creatinine, Ser: 0.78 mg/dL (ref 0.61–1.24)
GLUCOSE: 120 mg/dL — AB (ref 65–99)
POTASSIUM: 4.3 mmol/L (ref 3.5–5.1)
SODIUM: 133 mmol/L — AB (ref 135–145)

## 2014-12-04 LAB — CBC WITH DIFFERENTIAL/PLATELET
BASOS ABS: 0 10*3/uL (ref 0–0.1)
BASOS PCT: 0 %
EOS PCT: 0 %
Eosinophils Absolute: 0 10*3/uL (ref 0–0.7)
HCT: 38.5 % — ABNORMAL LOW (ref 40.0–52.0)
Hemoglobin: 13 g/dL (ref 13.0–18.0)
LYMPHS PCT: 17 %
Lymphs Abs: 1 10*3/uL (ref 1.0–3.6)
MCH: 35.4 pg — ABNORMAL HIGH (ref 26.0–34.0)
MCHC: 33.8 g/dL (ref 32.0–36.0)
MCV: 104.9 fL — AB (ref 80.0–100.0)
Monocytes Absolute: 0.3 10*3/uL (ref 0.2–1.0)
Monocytes Relative: 4 %
NEUTROS ABS: 4.8 10*3/uL (ref 1.4–6.5)
Neutrophils Relative %: 79 %
PLATELETS: 522 10*3/uL — AB (ref 150–440)
RBC: 3.67 MIL/uL — AB (ref 4.40–5.90)
RDW: 15.4 % — ABNORMAL HIGH (ref 11.5–14.5)
WBC: 6.1 10*3/uL (ref 3.8–10.6)

## 2014-12-04 MED ORDER — AMOXICILLIN-POT CLAVULANATE 875-125 MG PO TABS: 1.0000 | ORAL_TABLET | Freq: Two times a day (BID) | ORAL | Status: DC

## 2014-12-04 NOTE — Progress Notes (Signed)
[place] @ [facility] [phone]  [phone]     [name] [name] OB: 18-Nov-1929  MR#: [id number]  LAG#:[id number]  Patient Care Team: Juline Patch, MD as PCP - General (Family Medicine) Kathrynn Ducking, MD (Neurology) Forest Gleason, MD as Consulting Physician (Unknown Physician Specialty) Carlena Bjornstad, MD as Consulting Physician (Cardiology) Seeplaputhur Robinette Haines, MD (General Surgery)  CHIEF COMPLAINT:  Chief Complaint  Patient presents with  . Follow-up    Oncology History   Chief Complaint/Problem List  1.  Autoimmune hemolytic anemia. Previously received Prednisone taper.   2.  Splenectomy in 5/02.  3.  Gastric ulcer diagnosed on endoscopy and multiple biopsies are pending, November 2005. 4.  Chronic lymphocytic leukemia. 5.vitamin B 12 deficiency 6.  Recent admission in the hospital in June of 2016 with hemolytic anemia and hemoglobin of 5.5 patient received blood transfusion was started on steroid 7.  Patient was started on rituximab (October 20, 2014     Chronic lymphocytic leukemia   08/01/2008 Initial Diagnosis Chronic lymphocytic leukemia    Oncology Flowsheet 11/22/2014 11/23/2014 11/24/2014 11/25/2014 11/26/2014 11/27/2014 11/28/2014  cyanocobalamin ((VITAMIN B-12)) IM - - - - - - -  enoxaparin (LOVENOX) Blanket - 40 mg 40 mg 40 mg - 40 mg 40 mg  methylPREDNISolone sodium succinate 40 mg/mL (SOLU-MEDROL) IV - - - - - - -  predniSONE (DELTASONE) PO 10 mg 10 mg 10 mg 10 mg 10 mg 10 mg 10 mg  riTUXimab (RITUXAN) IV - - - - - - -  zolendronic acid (ZOMETA) IV - - - - - - -    INTERVAL HISTORY:   81 year old gentleman with a history of autoimmune hemolytic anemia and chronic lymphocytic leukemia Patient was recently discharged from the hospital because of cellulitis of the right thigh which is gradually improving.  On oral antibiotics with doxycycline and Augmentin. Patient also has been started on prednisone being tapered off hemoglobin has improved to 13  g. Patient also being followed by surgeon. Well-being is improving REVIEW OF SYSTEMS:    general status: Patient is feeling weak and tired.  No change in a performance status.  No chills.  No fever. HEENT:.  No evidence of stomatitis Lungs: No cough or shortness of breath Cardiac: No chest pain or paroxysmal nocturnal dyspnea GI: No nausea no vomiting no diarrhea no abdominal pain Skin: No rash Lower extremity redness and tenderness Improving Neurological system: No tingling.  No numbness.  No other focal signs Musculoskeletal system no bony pains  As per HPI. Otherwise, a complete review of systems is negatve.  PAST MEDICAL HISTORY: Past Medical History  Diagnosis Date  . CLL (chronic lymphoblastic leukemia) 2006    Rituxan treatment 2006  . Atrial septal aneurysm 2009    Insignificant, no, January, 2009  . Infection of PEG site     cellulitis.Marland KitchenResolved  . Vertebral compression fracture     multiple levels  . Herpes zoster     right chest  . Aortic insufficiency 2009    mild, Mild, echo, 2012  . Diastolic dysfunction     mild  . Chest pain     EF 55%, echo, October, 2009, possible inferior and posterior borderline hypokinesis, patient has never  had cardiac catheterization  . Edema     EF 55%, echo, October, 2009, possible borderline inferior and posterior hypokinesis... heart catheterization has never been done(July 10, 2009)  . Myositis     Caused pulmonary insufficiency with muscle weakness in the past /  Dr.Willis-neurology, Imuran and prednisone  . Calculus of kidney   . Inguinal hernia without mention of obstruction or gangrene, unilateral or unspecified, (not specified as recurrent)     right   . Unspecified gastritis and gastroduodenitis without mention of hemorrhage   . Abdominal pain, epigastric   . Nonspecific elevation of levels of transaminase or lactic acid dehydrogenase (LDH)   . Feeding difficulties and mismanagement   . Edema of male genital organs      Resolved, occurred during illness related to myositis  . Swelling of arm     left.Marland KitchenMarland KitchenResolved.... no DVT  . LFT elevation     Related to Imuran in the past... dose was adjusted  . Dyslipidemia     statins not used due to LFT abnormalities  . Shortness of breath     Breathing abnormalities related to muscle weakness from myositis  . RBBB (right bundle branch block)     Old  . Bradycardia     Sinus bradycardia, old  . Ejection fraction     EF 60%, echo, October, 2012  . Mitral regurgitation     Mild / moderate, echo, 2012  . Right ventricular dysfunction     Mild, echo, 2012  . Febrile illness     May, 2013, question sinusitis  . Polymyositis 11/26/2012    PAST SURGICAL HISTORY: Past Surgical History  Procedure Laterality Date  . Splenectomy    . Hernia repair      left side  . Peg placement  06/2007  . Peg tube removal  12/2007  . Kyphosis surgery      multilevel vertebral  . Odontoid fracture surgery      anterior screw fixation  . Nasal sinus surgery      x2  . Cataract extraction      bi lateral    FAMILY HISTORY Family History  Problem Relation Age of Onset  . Breast cancer Sister   . Colon cancer Brother   . Prostate cancer Brother   . Stroke Mother   . Hypertension Mother   . Emphysema Father   . Diabetes Grandchild   . Heart attack Neg Hx   . Cancer Brother   . Cancer Sister   . Cancer Daughter     ADVANCED DIRECTIVES:  Patient does have advance healthcare directive, Patient   does not desire to make any changes  HEALTH MAINTENANCE: Social History  Substance Use Topics  . Smoking status: Former Smoker -- 0.50 packs/day for 31 years    Types: Cigarettes    Quit date: 06/29/1968  . Smokeless tobacco: Former Systems developer    Types: Chew  . Alcohol Use: No     Comment: rarely      Allergies  Allergen Reactions  . Sulfonamide Derivatives     Current Outpatient Prescriptions  Medication Sig Dispense Refill  . amoxicillin-clavulanate (AUGMENTIN)  875-125 MG per tablet Take 1 tablet by mouth 2 (two) times daily. 14 tablet 0  . ASMANEX 60 METERED DOSES 220 MCG/INH inhaler Inhale 1 puff into the lungs 2 (two) times daily.  0  . aspirin 81 MG tablet Take 81 mg by mouth at bedtime.     . calcium-vitamin D (CALCIUM 500+D) 500-200 MG-UNIT per tablet Take 1 tablet by mouth 2 (two) times daily.      . Cholecalciferol (VITAMIN D3) 1000 UNITS tablet Take 1,000 Units by mouth daily.      . citalopram (CELEXA) 20 MG tablet Take 1 tablet (20 mg total)  by mouth daily. 30 tablet 0  . cyanocobalamin (,VITAMIN B-12,) 1000 MCG/ML injection Inject 1,000 mcg into the muscle every 14 (fourteen) days. At the first of the month    . doxycycline (VIBRA-TABS) 100 MG tablet Take 1 tablet (100 mg total) by mouth 2 (two) times daily. 14 tablet 0  . ferrous sulfate 325 (65 FE) MG tablet Take 325 mg by mouth daily with breakfast.      . fish oil-omega-3 fatty acids 1000 MG capsule Take 1 g by mouth daily at 12 noon.     . folic acid (FOLVITE) 1 MG tablet Take 4 tablets (4 mg total) by mouth daily. 120 tablet 0  . furosemide (LASIX) 40 MG tablet Take 1 tablet (40 mg total) by mouth daily. 90 tablet 3  . HYDROcodone-acetaminophen (NORCO/VICODIN) 5-325 MG per tablet Take 1 tablet by mouth every 6 (six) hours as needed for pain.    . montelukast (SINGULAIR) 10 MG tablet Take 10 mg by mouth at bedtime.   11  . Multiple Vitamin (MULTIVITAMIN) capsule Take 1 capsule by mouth daily.      . mycophenolate (CELLCEPT) 500 MG tablet Take 1 tablet (500 mg total) by mouth daily. 90 tablet 3  . pantoprazole (PROTONIX) 40 MG tablet Take 1 tablet (40 mg total) by mouth daily at 12 noon. 30 tablet 0  . predniSONE (DELTASONE) 20 MG tablet Take 0.5 tablets (10 mg total) by mouth daily with supper. 90 tablet 0  . PROAIR HFA 108 (90 BASE) MCG/ACT inhaler Inhale 2 puffs into the lungs 4 (four) times daily.   11  . Resource Beneprotein PACK Take 3g three times a day    . temazepam (RESTORIL)  30 MG capsule Take 30 mg by mouth at bedtime.      . vitamin B-12 (CYANOCOBALAMIN) 1000 MCG tablet Take 1,000 mcg by mouth daily at 12 noon.      No current facility-administered medications for this visit.    OBJECTIVE:  Filed Vitals:   12/04/14 0952  BP: 150/66  Pulse: 45  Temp: 96.1 F (35.6 C)     Body mass index is 20.83 kg/(m^2).    ECOG FS:1 - Symptomatic but completely ambulatory  PHYSICAL EXAM: Gen. status: Patient is alert oriented not any acute distress Lymphatic system: Supraclavicular, cervical, axillary, inguinal lymph nodes are not palpable Head exam was generally normal. There was no scleral icterus or corneal arcus. Mucous membranes were moist. Cardiac: Tachycardia Abdominal exam revealed normal bowel sounds. The abdomen was soft, non-tender, and without masses, organomegaly, or appreciable enlargement of the abdominal aorta. Examination of the skin revealed no evidence of significant rashes, suspicious appearing nevi or other concerning lesions. Neurologically, the patient was awake, alert, and oriented to person, place and time. There were no obvious focal neurologic abnormalities. Redness and tenderness in the left thigh has improved.  However still small area of redness persist  Kyphoscoliosis: Osteoporosis and multiple vertebral compression fracture  Skin: Grade 1 rash LAB RESULTS:  Appointment on 12/04/2014  Component Date Value Ref Range Status  . WBC 12/04/2014 6.1  3.8 - 10.6 K/uL Final  . RBC 12/04/2014 3.67* 4.40 - 5.90 MIL/uL Final  . Hemoglobin 12/04/2014 13.0  13.0 - 18.0 g/dL Final  . HCT 12/04/2014 38.5* 40.0 - 52.0 % Final  . MCV 12/04/2014 104.9* 80.0 - 100.0 fL Final  . MCH 12/04/2014 35.4* 26.0 - 34.0 pg Final  . MCHC 12/04/2014 33.8  32.0 - 36.0 g/dL Final  . RDW  12/04/2014 15.4* 11.5 - 14.5 % Final  . Platelets 12/04/2014 522* 150 - 440 K/uL Final  . Neutrophils Relative % 12/04/2014 79   Final  . Neutro Abs 12/04/2014 4.8  1.4 - 6.5  K/uL Final  . Lymphocytes Relative 12/04/2014 17   Final  . Lymphs Abs 12/04/2014 1.0  1.0 - 3.6 K/uL Final  . Monocytes Relative 12/04/2014 4   Final  . Monocytes Absolute 12/04/2014 0.3  0.2 - 1.0 K/uL Final  . Eosinophils Relative 12/04/2014 0   Final  . Eosinophils Absolute 12/04/2014 0.0  0 - 0.7 K/uL Final  . Basophils Relative 12/04/2014 0   Final  . Basophils Absolute 12/04/2014 0.0  0 - 0.1 K/uL Final  . Sodium 12/04/2014 133* 135 - 145 mmol/L Final  . Potassium 12/04/2014 4.3  3.5 - 5.1 mmol/L Final  . Chloride 12/04/2014 100* 101 - 111 mmol/L Final  . CO2 12/04/2014 30  22 - 32 mmol/L Final  . Glucose, Bld 12/04/2014 120* 65 - 99 mg/dL Final  . BUN 12/04/2014 17  6 - 20 mg/dL Final  . Creatinine, Ser 12/04/2014 0.78  0.61 - 1.24 mg/dL Final  . Calcium 12/04/2014 8.5* 8.9 - 10.3 mg/dL Final  . GFR calc non Af Amer 12/04/2014 >60  >60 mL/min Final  . GFR calc Af Amer 12/04/2014 >60  >60 mL/min Final   Comment: (NOTE) The eGFR has been calculated using the CKD EPI equation. This calculation has not been validated in all clinical situations. eGFR's persistently <60 mL/min signify possible Chronic Kidney Disease.   Georgiann Hahn gap 12/04/2014 3* 5 - 15 Final      STUDIES: Ct Pelvis W Contrast  11/19/2014   CLINICAL DATA:  Pain, swelling and erythema in the left groin and thigh for 1 week. No acute injury or prior relevant surgery. History of chronic lymphocytic leukemia.  EXAM: CT PELVIS AND BILATERAL LOWER EXTREMITIES WITH CONTRAST  TECHNIQUE: Multidetector CT imaging of the pelvis and bilateral lower extremities was performed according to the standard protocol following bolus administration of intravenous contrast. Multiplanar CT image reconstructions were also generated.  CONTRAST:  168m OMNIPAQUE IOHEXOL 300 MG/ML  SOLN  COMPARISON:  Doppler ultrasound 11/15/2014. Abdominal CT 10/11/2014.  FINDINGS: CT PELVIS FINDINGS  No intrapelvic inflammatory changes or fluid collections  demonstrated. There is moderate stool throughout the colon. The appendix appears normal.  There is aortoiliac atherosclerosis and moderate enlargement of the prostate gland. The bladder appears unremarkable. Bilateral scrotal hydroceles noted.  There are postsurgical changes in the left inguinal region without evidence of significant recurrent hernia.  The bones are demineralized without acute findings. 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 Patient is status post spinal augmentation at L4 and L5.  CT BILATERAL LOWER EXTREMITY FINDINGS  There is asymmetric enlargement of the left thigh with diffuse dermal thickening. There is asymmetric subcutaneous edema in the proximal thigh anteriorly and medially. More distally, this edema is more circumferential. No focal fluid collections are identified. Edema extends into the fascia within the posterior compartment. No intramuscular fluid collections identified.  There are probable reactive lymph nodes within the left groin. There is no significant hip joint effusion.  Diffuse vascular calcifications are noted throughout the femoral arteries. The left femoral vein is opacified and appears patent. There is limited contrast material within the right femoral vein. No DVT demonstrated.  There is no evidence of femoral bone destruction, acute fracture or dislocation.  IMPRESSION: 1. Findings are consistent with left thigh cellulitis and  possible fasciitis. No focal fluid collection to suggest abscess. 2. No evidence of osteomyelitis. 3. No significant intrapelvic inflammatory changes. 4. Diffuse atherosclerosis with asymmetric femoral vein enhancement (decreased on the right). This asymmetry may be secondary to hyperemia on the left. 5. Bilateral scrotal hydroceles.   Electronically Signed   By: Richardean Sale M.D.   On: 11/19/2014 18:59   US Venous Img Lower Unilateral Left  11/15/2014   CLINICAL DATA:  Left lower extremity edema and erythema for 4 days  EXAM: LEFT  LOWER EXTREMITY VENOUS DOPPLER ULTRASOUND  TECHNIQUE: Gray-scale sonography with graded compression, as well as color Doppler and duplex ultrasound were performed to evaluate the lower extremity deep venous systems from the level of the common femoral vein and including the common femoral, femoral, profunda femoral, popliteal and calf veins including the posterior tibial, peroneal and gastrocnemius veins when visible. The superficial great saphenous vein was also interrogated. Spectral Doppler was utilized to evaluate flow at rest and with distal augmentation maneuvers in the common femoral, femoral and popliteal veins.  COMPARISON:  None.  FINDINGS: Contralateral Common Femoral Vein: Respiratory phasicity is normal and symmetric with the symptomatic side. No evidence of thrombus. Normal compressibility.  Common Femoral Vein: No evidence of thrombus. Normal compressibility, respiratory phasicity and response to augmentation.  Saphenofemoral Junction: No evidence of thrombus. Normal compressibility and flow on color Doppler imaging.  Profunda Femoral Vein: No evidence of thrombus. Normal compressibility and flow on color Doppler imaging.  Femoral Vein: No evidence of thrombus. Normal compressibility, respiratory phasicity and response to augmentation.  Popliteal Vein: No evidence of thrombus. Normal compressibility, respiratory phasicity and response to augmentation.  Calf Veins: No evidence of thrombus. Normal compressibility and flow on color Doppler imaging.  Superficial Great Saphenous Vein: No evidence of thrombus. Normal compressibility and flow on color Doppler imaging.  Venous Reflux:  None.  Other Findings: Multiple prominent left inguinal lymph nodes the largest measuring 21 mm in greatest diameter. These demonstrate fatty hila with no significant cortical thickening and are presumably reactive.  IMPRESSION: No evidence of deep venous thrombosis.   Electronically Signed   By: Skipper Cliche M.D.   On:  11/15/2014 12:34   [place] Extrem Low Left Ltd  11/22/2014   CLINICAL DATA:  Focal swelling in the left medial thigh extending posteriorly  EXAM: ULTRASOUND left LOWER EXTREMITY LIMITED  TECHNIQUE: Ultrasound examination of the lower extremity soft tissues was performed in the area of clinical concern.  COMPARISON:  11/19/2014  FINDINGS: Diffuse subcutaneous edema is noted similar to that seen on the recent CT examination. Scattered subcutaneous fluid is identified although no focal definitive abscess is seen.  IMPRESSION: Changes most consistent with cellulitis without definitive focal abscess.   Electronically Signed   By: Inez Catalina M.D.   On: 11/22/2014 20:26   Ct Extrem Lower W Cm Bil  11/19/2014   CLINICAL DATA:  Pain, swelling and erythema in the left groin and thigh for 1 week. No acute injury or prior relevant surgery. History of chronic lymphocytic leukemia.  EXAM: CT PELVIS AND BILATERAL LOWER EXTREMITIES WITH CONTRAST  TECHNIQUE: Multidetector CT imaging of the pelvis and bilateral lower extremities was performed according to the standard protocol following bolus administration of intravenous contrast. Multiplanar CT image reconstructions were also generated.  CONTRAST:  130m OMNIPAQUE IOHEXOL 300 MG/ML  SOLN  COMPARISON:  Doppler ultrasound 11/15/2014. Abdominal CT 10/11/2014.  FINDINGS: CT PELVIS FINDINGS  No intrapelvic inflammatory changes or fluid collections demonstrated. There is moderate  stool throughout the colon. The appendix appears normal.  There is aortoiliac atherosclerosis and moderate enlargement of the prostate gland. The bladder appears unremarkable. Bilateral scrotal hydroceles noted.  There are postsurgical changes in the left inguinal region without evidence of significant recurrent hernia.  The bones are demineralized without acute findings. 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 Patient is status post spinal augmentation at L4 and L5.  CT BILATERAL LOWER EXTREMITY  FINDINGS  There is asymmetric enlargement of the left thigh with diffuse dermal thickening. There is asymmetric subcutaneous edema in the proximal thigh anteriorly and medially. More distally, this edema is more circumferential. No focal fluid collections are identified. Edema extends into the fascia within the posterior compartment. No intramuscular fluid collections identified.  There are probable reactive lymph nodes within the left groin. There is no significant hip joint effusion.  Diffuse vascular calcifications are noted throughout the femoral arteries. The left femoral vein is opacified and appears patent. There is limited contrast material within the right femoral vein. No DVT demonstrated.  There is no evidence of femoral bone destruction, acute fracture or dislocation.  IMPRESSION: 1. Findings are consistent with left thigh cellulitis and possible fasciitis. No focal fluid collection to suggest abscess. 2. No evidence of osteomyelitis. 3. No significant intrapelvic inflammatory changes. 4. Diffuse atherosclerosis with asymmetric femoral vein enhancement (decreased on the right). This asymmetry may be secondary to hyperemia on the left. 5. Bilateral scrotal hydroceles.   Electronically Signed   By: Richardean Sale M.D.   On: 11/19/2014 18:59    ASSESSMENT: Autoimmune hemolytic anemia with lymphoproliferative disease Cellulitis of lower extremity resolving very slowly.  We will continue 7 more days of antibiotics.  Will be evaluated by surgeon.  We will reevaluate patient next week.  Hemoglobin has improved on prednisone which is being tapered off   MEDICAL DECISION MAKING:  Cellulitis of lower extremity Gradually improving. Continue antibiotic for 7 more days    No matching staging information was found for the patient.  Forest Gleason, MD   12/04/2014 10:10 PM

## 2014-12-04 NOTE — Progress Notes (Signed)
Patient does not have living will.  Former smoker. 

## 2014-12-06 NOTE — Discharge Summary (Signed)
Physician Discharge Summary  [name] MRN: [id number] DOB/AGE: 12/11/1929 81 y.o.  Admit date: 11/19/2014 Discharge date: 11/28/2014  Admission Diagnoses: Cellulitis of the left lower extremity organisms unspecified Autoimmune hemolytic anemia Chronic lymphocytic leukemia status post treatment with rituximab and prednisone  Discharge Diagnoses:  Active Problems:   Cellulitis  cellulitis organisms unspecified of left lower extremity  Discharged Condition: Improved  Hospital Course: During hospital stay patient was started on antibiotics with Zosyn and vancomycin.  Multiple consultation with infectious disease and surgical service has been obtained.  CT scan of the left lower extremity did not reveal any abscess.  There was a slow improvement off redness and cellulitis.  Patient started making progress with ambulation And was discharged with improved general condition  Consults:  Infectious disease with Dr. Adrian Prows Surgical consultation with Dr. Jamal Collin  Significant Diagnostic Studies: Blood cultures CT scan of lower extremity Treatments:  IV antibiotics Discharge Exam: Blood pressure 135/64, pulse 59, temperature 98.1 F (36.7 C), temperature source Oral, resp. rate 18, height 5\' 6"  (1.676 m), weight 133 lb (60.328 kg), SpO2 96 %.  general status: Patient was alert oriented not any acute distress.  Cardiac exam revealed the PMI to be normally situated and sized. The rhythm was regular and no extrasystoles were noted during several minutes of auscultation. The first and second heart sounds were normal and physiologic splitting of the second heart sound was noted. There were no murmurs, rubs, clicks, or gallops.Abdominal exam revealed normal bowel sounds. The abdomen was soft, non-tender, and without masses, organomegaly, or appreciable enlargement of the abdominal aorta..  In normal skin.  Examination of lymph node.  No palpable lymphadenopathy Left lower  extremity cellulitis has responded to IV antibiotics  Disposition:Patient was discharged home    Medication List    STOP taking these medications        mupirocin ointment 2 %  Commonly known as:  BACTROBAN     nystatin cream  Commonly known as:  MYCOSTATIN     potassium chloride SA 20 MEQ tablet  Commonly known as:  KLOR-CON M20     predniSONE 20 MG tablet  Commonly known as:  DELTASONE     triamcinolone cream 0.1 %  Commonly known as:  KENALOG      TAKE these medications        ASMANEX 60 METERED DOSES 220 MCG/INH inhaler  Generic drug:  mometasone  Inhale 1 puff into the lungs 2 (two) times daily.     aspirin 81 MG tablet  Take 81 mg by mouth at bedtime.     CALCIUM 500+D 500-200 MG-UNIT per tablet  Generic drug:  calcium-vitamin D  Take 1 tablet by mouth 2 (two) times daily.     cholecalciferol 1000 UNITS tablet  Commonly known as:  VITAMIN D  Take 1,000 Units by mouth daily.     citalopram 20 MG tablet  Commonly known as:  CELEXA  Take 1 tablet (20 mg total) by mouth daily.     doxycycline 100 MG tablet  Commonly known as:  VIBRA-TABS  Take 1 tablet (100 mg total) by mouth 2 (two) times daily.     ferrous sulfate 325 (65 FE) MG tablet  Take 325 mg by mouth daily with breakfast.     fish oil-omega-3 fatty acids 1000 MG capsule  Take 1 g by mouth daily at 12 noon.     folic acid 1 MG tablet  Commonly known as:  FOLVITE  Take 4  tablets (4 mg total) by mouth daily.     furosemide 40 MG tablet  Commonly known as:  LASIX  Take 1 tablet (40 mg total) by mouth daily.     HYDROcodone-acetaminophen 5-325 MG per tablet  Commonly known as:  NORCO/VICODIN  Take 1 tablet by mouth every 6 (six) hours as needed for pain.     montelukast 10 MG tablet  Commonly known as:  SINGULAIR  Take 10 mg by mouth at bedtime.     multivitamin capsule  Take 1 capsule by mouth daily.     mycophenolate 500 MG tablet  Commonly known as:  CELLCEPT  Take 1 tablet (500 mg  total) by mouth daily.     pantoprazole 40 MG tablet  Commonly known as:  PROTONIX  Take 1 tablet (40 mg total) by mouth daily at 12 noon.     PROAIR HFA 108 (90 BASE) MCG/ACT inhaler  Generic drug:  albuterol  Inhale 2 puffs into the lungs 4 (four) times daily.     RESOURCE BENEPROTEIN Pack  Take 3g three times a day     temazepam 30 MG capsule  Commonly known as:  RESTORIL  Take 30 mg by mouth at bedtime.     cyanocobalamin 1000 MCG/ML injection  Commonly known as:  (VITAMIN B-12)  Inject 1,000 mcg into the muscle every 14 (fourteen) days. At the first of the month     vitamin B-12 1000 MCG tablet  Commonly known as:  CYANOCOBALAMIN  Take 1,000 mcg by mouth daily at 12 noon.           Follow-up Information    Follow up with Christene Lye, MD. Schedule an appointment as soon as possible for a visit in 1 week.   Specialties:  General Surgery, Radiology   Contact information:   [address] [place] [address] [phone]       Signed: Forest Gleason 12/06/2014, 6:56 AM

## 2014-12-09 ENCOUNTER — Encounter: Payer: Self-pay | Admitting: General Surgery

## 2014-12-09 ENCOUNTER — Ambulatory Visit (INDEPENDENT_AMBULATORY_CARE_PROVIDER_SITE_OTHER): Payer: Medicare Other | Admitting: General Surgery

## 2014-12-09 VITALS — BP 104/60 | HR 76 | Resp 20 | Ht 66.0 in | Wt 128.0 lb

## 2014-12-09 DIAGNOSIS — L03116 Cellulitis of left lower limb: Secondary | ICD-10-CM

## 2014-12-09 NOTE — Progress Notes (Signed)
[name], male   DOB: Mar 05, 1930, 81 y.o.   MRN: [id number]  Chief Complaint  Patient presents with  . Follow-up    HPI [name] [name] is a 81 y.o. male.  Here today for follow up upper left thigh cellulitis. He states he is better. The area has improved but still has a hard area. HPI  Past Medical History  Diagnosis Date  . CLL (chronic lymphoblastic leukemia) 2006    Rituxan treatment 2006  . Atrial septal aneurysm 2009    Insignificant, no, January, 2009  . Infection of PEG site     cellulitis.Marland KitchenResolved  . Vertebral compression fracture     multiple levels  . Herpes zoster     right chest  . Aortic insufficiency 2009    mild, Mild, echo, 2012  . Diastolic dysfunction     mild  . Chest pain     EF 55%, echo, October, 2009, possible inferior and posterior borderline hypokinesis, patient has never  had cardiac catheterization  . Edema     EF 55%, echo, October, 2009, possible borderline inferior and posterior hypokinesis... heart catheterization has never been done(July 10, 2009)  . Myositis     Caused pulmonary insufficiency with muscle weakness in the past / Dr.Willis-neurology, Imuran and prednisone  . Calculus of kidney   . Inguinal hernia without mention of obstruction or gangrene, unilateral or unspecified, (not specified as recurrent)     right   . Unspecified gastritis and gastroduodenitis without mention of hemorrhage   . Abdominal pain, epigastric   . Nonspecific elevation of levels of transaminase or lactic acid dehydrogenase (LDH)   . Feeding difficulties and mismanagement   . Edema of male genital organs     Resolved, occurred during illness related to myositis  . Swelling of arm     left.Marland KitchenMarland KitchenResolved.... no DVT  . LFT elevation     Related to Imuran in the past... dose was adjusted  . Dyslipidemia     statins not used due to LFT abnormalities  . Shortness of breath     Breathing abnormalities related to muscle weakness from myositis  . RBBB  (right bundle branch block)     Old  . Bradycardia     Sinus bradycardia, old  . Ejection fraction     EF 60%, echo, October, 2012  . Mitral regurgitation     Mild / moderate, echo, 2012  . Right ventricular dysfunction     Mild, echo, 2012  . Febrile illness     May, 2013, question sinusitis  . Polymyositis 11/26/2012    Past Surgical History  Procedure Laterality Date  . Splenectomy    . Hernia repair      left side  . Peg placement  06/2007  . Peg tube removal  12/2007  . Kyphosis surgery      multilevel vertebral  . Odontoid fracture surgery      anterior screw fixation  . Nasal sinus surgery      x2  . Cataract extraction      bi lateral    Family History  Problem Relation Age of Onset  . Breast cancer Sister   . Colon cancer Brother   . Prostate cancer Brother   . Stroke Mother   . Hypertension Mother   . Emphysema Father   . Diabetes Grandchild   . Heart attack Neg Hx   . Cancer Brother   . Cancer Sister   . Cancer Daughter  Social History Social History  Substance Use Topics  . Smoking status: Former Smoker -- 0.50 packs/day for 31 years    Types: Cigarettes    Quit date: 06/29/1968  . Smokeless tobacco: Former Systems developer    Types: Chew  . Alcohol Use: No     Comment: rarely    Allergies  Allergen Reactions  . Sulfonamide Derivatives     Current Outpatient Prescriptions  Medication Sig Dispense Refill  . calcium-vitamin D (CALCIUM 500+D) 500-200 MG-UNIT per tablet Take 1 tablet by mouth 2 (two) times daily.      . Cholecalciferol (VITAMIN D3) 1000 UNITS tablet Take 1,000 Units by mouth daily.      . cyanocobalamin (,VITAMIN B-12,) 1000 MCG/ML injection Inject 1,000 mcg into the muscle every 14 (fourteen) days. At the first of the month    . ferrous sulfate 325 (65 FE) MG tablet Take 325 mg by mouth daily with breakfast.      . fish oil-omega-3 fatty acids 1000 MG capsule Take 1 g by mouth daily at 12 noon.     . folic acid (FOLVITE) 1 MG tablet  Take 4 tablets (4 mg total) by mouth daily. 120 tablet 0  . montelukast (SINGULAIR) 10 MG tablet Take 10 mg by mouth at bedtime.   11  . Multiple Vitamin (MULTIVITAMIN) capsule Take 1 capsule by mouth daily.      . mycophenolate (CELLCEPT) 500 MG tablet Take 1 tablet (500 mg total) by mouth daily. 90 tablet 3  . pantoprazole (PROTONIX) 40 MG tablet Take 1 tablet (40 mg total) by mouth daily at 12 noon. 30 tablet 0  . potassium chloride SA (K-DUR,KLOR-CON) 20 MEQ tablet Take 20 mEq by mouth daily. Mon and Fri he takes BID  2  . predniSONE (DELTASONE) 20 MG tablet Take 0.5 tablets (10 mg total) by mouth daily with supper. (Patient taking differently: Take 10 mg by mouth daily with supper. Dr Oliva Bustard) 90 tablet 0  . PROAIR HFA 108 (90 BASE) MCG/ACT inhaler Inhale 2 puffs into the lungs 4 (four) times daily.   11  . Resource Beneprotein PACK Take 3g three times a day    . amoxicillin-clavulanate (AUGMENTIN) 875-125 MG per tablet Take 1 tablet by mouth 2 (two) times daily. Dr Oliva Bustard    . citalopram (CELEXA) 40 MG tablet Take 1 tablet (40 mg total) by mouth daily. 90 tablet 1  . furosemide (LASIX) 80 MG tablet Take 80 mg by mouth.    Marland Kitchen HYDROcodone-acetaminophen (NORCO/VICODIN) 5-325 MG per tablet Take 1 tablet by mouth every 6 (six) hours as needed. 120 tablet 0  . temazepam (RESTORIL) 30 MG capsule Take 1 capsule (30 mg total) by mouth at bedtime. 30 capsule 5   No current facility-administered medications for this visit.    Review of Systems Review of Systems  Constitutional: Negative.   Respiratory: Negative.   Cardiovascular: Negative.     Blood pressure 104/60, pulse 76, resp. rate 20, height 5\' 6"  (1.676 m), weight 128 lb (58.06 kg).  Physical Exam Physical Exam  Constitutional: He is oriented to person, place, and time. He appears well-developed and well-nourished.  HENT:  Mouth/Throat: Oropharynx is clear and moist.  Eyes: Conjunctivae are normal. No scleral icterus.  Neurological:  He is alert and oriented to person, place, and time.  Skin: Skin is warm and dry.  Marked improvement in left thigh abscess.   Psychiatric: His behavior is normal.  Redness in upper medial left thigh is minimal. There  is slowly resolving inflammationin this area.  No fluctuance or signs of an abscess.  Data Reviewed none  Assessment    Marked improvement in left thigh cellulitis.     Plan    Follow up in 3 weeks.       PCP:  Otilio Miu Ref: Dr Eda Paschal 12/11/2014, 11:26 AM

## 2014-12-09 NOTE — Patient Instructions (Signed)
The patient is aware to call back for any questions or concerns.  

## 2014-12-11 ENCOUNTER — Inpatient Hospital Stay: Payer: Medicare Other | Attending: Oncology

## 2014-12-11 ENCOUNTER — Encounter: Payer: Self-pay | Admitting: Family Medicine

## 2014-12-11 ENCOUNTER — Encounter: Payer: Self-pay | Admitting: Oncology

## 2014-12-11 ENCOUNTER — Ambulatory Visit (INDEPENDENT_AMBULATORY_CARE_PROVIDER_SITE_OTHER): Payer: Medicare Other | Admitting: Family Medicine

## 2014-12-11 ENCOUNTER — Encounter: Payer: Self-pay | Admitting: General Surgery

## 2014-12-11 ENCOUNTER — Inpatient Hospital Stay (HOSPITAL_BASED_OUTPATIENT_CLINIC_OR_DEPARTMENT_OTHER): Payer: Medicare Other | Admitting: Oncology

## 2014-12-11 VITALS — BP 120/64 | HR 64 | Ht 65.0 in | Wt 127.0 lb

## 2014-12-11 VITALS — BP 152/63 | HR 61 | Temp 96.9°F | Wt 127.4 lb

## 2014-12-11 DIAGNOSIS — Z87311 Personal history of (healed) other pathological fracture: Secondary | ICD-10-CM

## 2014-12-11 DIAGNOSIS — Z79899 Other long term (current) drug therapy: Secondary | ICD-10-CM

## 2014-12-11 DIAGNOSIS — Z8711 Personal history of peptic ulcer disease: Secondary | ICD-10-CM | POA: Diagnosis not present

## 2014-12-11 DIAGNOSIS — Z809 Family history of malignant neoplasm, unspecified: Secondary | ICD-10-CM | POA: Diagnosis not present

## 2014-12-11 DIAGNOSIS — I519 Heart disease, unspecified: Secondary | ICD-10-CM | POA: Insufficient documentation

## 2014-12-11 DIAGNOSIS — I4510 Unspecified right bundle-branch block: Secondary | ICD-10-CM

## 2014-12-11 DIAGNOSIS — B3749 Other urogenital candidiasis: Secondary | ICD-10-CM | POA: Diagnosis not present

## 2014-12-11 DIAGNOSIS — M3320 Polymyositis, organ involvement unspecified: Secondary | ICD-10-CM | POA: Insufficient documentation

## 2014-12-11 DIAGNOSIS — D591 Other autoimmune hemolytic anemias: Secondary | ICD-10-CM | POA: Diagnosis not present

## 2014-12-11 DIAGNOSIS — I340 Nonrheumatic mitral (valve) insufficiency: Secondary | ICD-10-CM | POA: Diagnosis not present

## 2014-12-11 DIAGNOSIS — L03116 Cellulitis of left lower limb: Secondary | ICD-10-CM

## 2014-12-11 DIAGNOSIS — R609 Edema, unspecified: Secondary | ICD-10-CM | POA: Diagnosis not present

## 2014-12-11 DIAGNOSIS — I708 Atherosclerosis of other arteries: Secondary | ICD-10-CM

## 2014-12-11 DIAGNOSIS — Z800 Family history of malignant neoplasm of digestive organs: Secondary | ICD-10-CM | POA: Diagnosis not present

## 2014-12-11 DIAGNOSIS — Z9081 Acquired absence of spleen: Secondary | ICD-10-CM | POA: Diagnosis not present

## 2014-12-11 DIAGNOSIS — N433 Hydrocele, unspecified: Secondary | ICD-10-CM | POA: Diagnosis not present

## 2014-12-11 DIAGNOSIS — F32A Depression, unspecified: Secondary | ICD-10-CM

## 2014-12-11 DIAGNOSIS — F17210 Nicotine dependence, cigarettes, uncomplicated: Secondary | ICD-10-CM | POA: Diagnosis not present

## 2014-12-11 DIAGNOSIS — Z87442 Personal history of urinary calculi: Secondary | ICD-10-CM | POA: Insufficient documentation

## 2014-12-11 DIAGNOSIS — N400 Enlarged prostate without lower urinary tract symptoms: Secondary | ICD-10-CM | POA: Insufficient documentation

## 2014-12-11 DIAGNOSIS — E538 Deficiency of other specified B group vitamins: Secondary | ICD-10-CM | POA: Diagnosis not present

## 2014-12-11 DIAGNOSIS — C9110 Chronic lymphocytic leukemia of B-cell type not having achieved remission: Secondary | ICD-10-CM

## 2014-12-11 DIAGNOSIS — R202 Paresthesia of skin: Secondary | ICD-10-CM | POA: Insufficient documentation

## 2014-12-11 DIAGNOSIS — F329 Major depressive disorder, single episode, unspecified: Secondary | ICD-10-CM | POA: Diagnosis not present

## 2014-12-11 DIAGNOSIS — M5137 Other intervertebral disc degeneration, lumbosacral region: Secondary | ICD-10-CM

## 2014-12-11 DIAGNOSIS — G4700 Insomnia, unspecified: Secondary | ICD-10-CM | POA: Diagnosis not present

## 2014-12-11 DIAGNOSIS — Z803 Family history of malignant neoplasm of breast: Secondary | ICD-10-CM | POA: Diagnosis not present

## 2014-12-11 LAB — BASIC METABOLIC PANEL
Anion gap: 7 (ref 5–15)
BUN: 18 mg/dL (ref 6–20)
CHLORIDE: 95 mmol/L — AB (ref 101–111)
CO2: 31 mmol/L (ref 22–32)
CREATININE: 0.86 mg/dL (ref 0.61–1.24)
Calcium: 9 mg/dL (ref 8.9–10.3)
Glucose, Bld: 113 mg/dL — ABNORMAL HIGH (ref 65–99)
Potassium: 4.3 mmol/L (ref 3.5–5.1)
SODIUM: 133 mmol/L — AB (ref 135–145)

## 2014-12-11 LAB — CBC WITH DIFFERENTIAL/PLATELET
BASOS PCT: 1 %
Basophils Absolute: 0.1 10*3/uL (ref 0–0.1)
EOS ABS: 0 10*3/uL (ref 0–0.7)
EOS PCT: 0 %
HCT: 39.2 % — ABNORMAL LOW (ref 40.0–52.0)
HEMOGLOBIN: 13.2 g/dL (ref 13.0–18.0)
LYMPHS ABS: 1.1 10*3/uL (ref 1.0–3.6)
Lymphocytes Relative: 14 %
MCH: 35.4 pg — AB (ref 26.0–34.0)
MCHC: 33.7 g/dL (ref 32.0–36.0)
MCV: 105 fL — ABNORMAL HIGH (ref 80.0–100.0)
MONOS PCT: 3 %
Monocytes Absolute: 0.3 10*3/uL (ref 0.2–1.0)
NEUTROS PCT: 82 %
Neutro Abs: 7.1 10*3/uL — ABNORMAL HIGH (ref 1.4–6.5)
PLATELETS: 228 10*3/uL (ref 150–440)
RBC: 3.73 MIL/uL — AB (ref 4.40–5.90)
RDW: 14.5 % (ref 11.5–14.5)
WBC: 8.5 10*3/uL (ref 3.8–10.6)

## 2014-12-11 MED ORDER — HYDROCODONE-ACETAMINOPHEN 5-325 MG PO TABS: 1.0000 | ORAL_TABLET | Freq: Four times a day (QID) | ORAL | Status: DC | PRN

## 2014-12-11 MED ORDER — TEMAZEPAM 30 MG PO CAPS: 30.0000 mg | ORAL_CAPSULE | Freq: Every day | ORAL | Status: DC

## 2014-12-11 MED ORDER — DOXYCYCLINE HYCLATE 100 MG PO TABS: 100.0000 mg | ORAL_TABLET | Freq: Two times a day (BID) | ORAL | Status: DC

## 2014-12-11 MED ORDER — CITALOPRAM HYDROBROMIDE 40 MG PO TABS: 40.0000 mg | ORAL_TABLET | Freq: Every day | ORAL | Status: DC

## 2014-12-11 MED ORDER — AMOXICILLIN-POT CLAVULANATE 875-125 MG PO TABS: 1.0000 | ORAL_TABLET | Freq: Two times a day (BID) | ORAL | Status: DC

## 2014-12-11 NOTE — Progress Notes (Signed)
[place] @ [facility] [phone]) [phone]  [phone]     Leonel Mccollum OB: 1930-02-03  MR#: [id number]  OJJ#:[id number]  Patient Care Team: Juline Patch, MD as PCP - General (Family Medicine) Kathrynn Ducking, MD (Neurology) Forest Gleason, MD as Consulting Physician (Unknown Physician Specialty) Carlena Bjornstad, MD as Consulting Physician (Cardiology) Seeplaputhur Robinette Haines, MD (General Surgery)  CHIEF COMPLAINT:  Chief Complaint  Patient presents with  . Follow-up    Oncology History   Chief Complaint/Problem List  1.  Autoimmune hemolytic anemia. Previously received Prednisone taper.   2.  Splenectomy in 5/02.  3.  Gastric ulcer diagnosed on endoscopy and multiple biopsies are pending, November 2005. 4.  Chronic lymphocytic leukemia. 5.vitamin B 12 deficiency 6.  Recent admission in the hospital in June of 2016 with hemolytic anemia and hemoglobin of 5.5 patient received blood transfusion was started on steroid 7.  Patient was started on rituximab (October 20, 2014     Chronic lymphocytic leukemia   08/01/2008 Initial Diagnosis Chronic lymphocytic leukemia    Oncology Flowsheet 11/22/2014 11/23/2014 11/24/2014 11/25/2014 11/26/2014 11/27/2014 11/28/2014  cyanocobalamin ((VITAMIN B-12)) IM - - - - - - -  enoxaparin (LOVENOX)  - 40 mg 40 mg 40 mg - 40 mg 40 mg  methylPREDNISolone sodium succinate 40 mg/mL (SOLU-MEDROL) IV - - - - - - -  predniSONE (DELTASONE) PO 10 mg 10 mg 10 mg 10 mg 10 mg 10 mg 10 mg  riTUXimab (RITUXAN) IV - - - - - - -  zolendronic acid (ZOMETA) IV - - - - - - -    INTERVAL HISTORY:   81 year old gentleman with a history of autoimmune hemolytic anemia and chronic lymphocytic leukemia Patient was recently discharged from the hospital because of cellulitis of the right thigh which is gradually improving.  On oral antibiotics with doxycycline and Augmentin. Patient also has been started on prednisone being tapered off hemoglobin has improved to 13  g. December 11, 2014 Patient came here for further follow-up regarding autoimmune hemolytic anemia.  Cellulitis.  Appetite has been poor.  Left lower extremity swelling is stabile eyes redness persists that might be some increasing redness and swelling Patient is being closely followed by surgical service No chills.  No fever.Marland Kitchen  REVIEW OF SYSTEMS:    general status: Patient is feeling weak and tired.  No change in a performance status.  No chills.  No fever. HEENT:.  No evidence of stomatitis Lungs: No cough or shortness of breath Cardiac: No chest pain or paroxysmal nocturnal dyspnea GI: No nausea no vomiting no diarrhea no abdominal pain Skin: No rash Lower extremity redness and tenderness Improving Neurological system: No tingling.  No numbness.  No other focal signs Musculoskeletal system no bony pains  As per HPI. Otherwise, a complete review of systems is negatve.  PAST MEDICAL HISTORY: Past Medical History  Diagnosis Date  . CLL (chronic lymphoblastic leukemia) 2006    Rituxan treatment 2006  . Atrial septal aneurysm 2009    Insignificant, no, January, 2009  . Infection of PEG site     cellulitis.Marland KitchenResolved  . Vertebral compression fracture     multiple levels  . Herpes zoster     right chest  . Aortic insufficiency 2009    mild, Mild, echo, 2012  . Diastolic dysfunction     mild  . Chest pain     EF 55%, echo, October, 2009, possible inferior and posterior borderline hypokinesis, patient has never  had cardiac  catheterization  . Edema     EF 55%, echo, October, 2009, possible borderline inferior and posterior hypokinesis... heart catheterization has never been done(July 10, 2009)  . Myositis     Caused pulmonary insufficiency with muscle weakness in the past / Dr.Willis-neurology, Imuran and prednisone  . Calculus of kidney   . Inguinal hernia without mention of obstruction or gangrene, unilateral or unspecified, (not specified as recurrent)     right   .  Unspecified gastritis and gastroduodenitis without mention of hemorrhage   . Abdominal pain, epigastric   . Nonspecific elevation of levels of transaminase or lactic acid dehydrogenase (LDH)   . Feeding difficulties and mismanagement   . Edema of male genital organs     Resolved, occurred during illness related to myositis  . Swelling of arm     left.Marland KitchenMarland KitchenResolved.... no DVT  . LFT elevation     Related to Imuran in the past... dose was adjusted  . Dyslipidemia     statins not used due to LFT abnormalities  . Shortness of breath     Breathing abnormalities related to muscle weakness from myositis  . RBBB (right bundle branch block)     Old  . Bradycardia     Sinus bradycardia, old  . Ejection fraction     EF 60%, echo, October, 2012  . Mitral regurgitation     Mild / moderate, echo, 2012  . Right ventricular dysfunction     Mild, echo, 2012  . Febrile illness     May, 2013, question sinusitis  . Polymyositis 11/26/2012    PAST SURGICAL HISTORY: Past Surgical History  Procedure Laterality Date  . Splenectomy    . Hernia repair      left side  . Peg placement  06/2007  . Peg tube removal  12/2007  . Kyphosis surgery      multilevel vertebral  . Odontoid fracture surgery      anterior screw fixation  . Nasal sinus surgery      x2  . Cataract extraction      bi lateral    FAMILY HISTORY Family History  Problem Relation Age of Onset  . Breast cancer Sister   . Colon cancer Brother   . Prostate cancer Brother   . Stroke Mother   . Hypertension Mother   . Emphysema Father   . Diabetes Grandchild   . Heart attack Neg Hx   . Cancer Brother   . Cancer Sister   . Cancer Daughter     ADVANCED DIRECTIVES:  Patient does have advance healthcare directive, Patient   does not desire to make any changes  HEALTH MAINTENANCE: Social History  Substance Use Topics  . Smoking status: Former Smoker -- 0.50 packs/day for 31 years    Types: Cigarettes    Quit date: 06/29/1968    . Smokeless tobacco: Former Systems developer    Types: Chew  . Alcohol Use: No     Comment: rarely      Allergies  Allergen Reactions  . Sulfonamide Derivatives     Current Outpatient Prescriptions  Medication Sig Dispense Refill  . amoxicillin-clavulanate (AUGMENTIN) 875-125 MG per tablet Take 1 tablet by mouth 2 (two) times daily. Dr Oliva Bustard 14 tablet 0  . calcium-vitamin D (CALCIUM 500+D) 500-200 MG-UNIT per tablet Take 1 tablet by mouth 2 (two) times daily.      . Cholecalciferol (VITAMIN D3) 1000 UNITS tablet Take 1,000 Units by mouth daily.      . citalopram (  CELEXA) 40 MG tablet Take 1 tablet (40 mg total) by mouth daily. 90 tablet 1  . cyanocobalamin (,VITAMIN B-12,) 1000 MCG/ML injection Inject 1,000 mcg into the muscle every 14 (fourteen) days. At the first of the month    . ferrous sulfate 325 (65 FE) MG tablet Take 325 mg by mouth daily with breakfast.      . fish oil-omega-3 fatty acids 1000 MG capsule Take 1 g by mouth daily at 12 noon.     . folic acid (FOLVITE) 1 MG tablet Take 4 tablets (4 mg total) by mouth daily. 120 tablet 0  . furosemide (LASIX) 80 MG tablet Take 80 mg by mouth.    Marland Kitchen HYDROcodone-acetaminophen (NORCO/VICODIN) 5-325 MG per tablet Take 1 tablet by mouth every 6 (six) hours as needed. 120 tablet 0  . montelukast (SINGULAIR) 10 MG tablet Take 10 mg by mouth at bedtime.   11  . Multiple Vitamin (MULTIVITAMIN) capsule Take 1 capsule by mouth daily.      . mycophenolate (CELLCEPT) 500 MG tablet Take 1 tablet (500 mg total) by mouth daily. 90 tablet 3  . pantoprazole (PROTONIX) 40 MG tablet Take 1 tablet (40 mg total) by mouth daily at 12 noon. 30 tablet 0  . potassium chloride SA (K-DUR,KLOR-CON) 20 MEQ tablet Take 20 mEq by mouth daily. Mon and Fri he takes BID  2  . predniSONE (DELTASONE) 20 MG tablet Take 0.5 tablets (10 mg total) by mouth daily with supper. (Patient taking differently: Take 10 mg by mouth daily with supper. Dr Oliva Bustard) 90 tablet 0  . PROAIR HFA 108  (90 BASE) MCG/ACT inhaler Inhale 2 puffs into the lungs 4 (four) times daily.   11  . Resource Beneprotein PACK Take 3g three times a day    . temazepam (RESTORIL) 30 MG capsule Take 1 capsule (30 mg total) by mouth at bedtime. 30 capsule 5  . doxycycline (VIBRA-TABS) 100 MG tablet Take 1 tablet (100 mg total) by mouth 2 (two) times daily. 14 tablet 0   No current facility-administered medications for this visit.    OBJECTIVE:  Filed Vitals:   12/11/14 1118  BP: 152/63  Pulse: 61  Temp: 96.9 F (36.1 C)     Body mass index is 21.2 kg/(m^2).    ECOG FS:1 - Symptomatic but completely ambulatory  PHYSICAL EXAM: Gen. status: Patient is alert oriented not any acute distress Lymphatic system: Supraclavicular, cervical, axillary, inguinal lymph nodes are not palpable Head exam was generally normal. There was no scleral icterus or corneal arcus. Mucous membranes were moist. Cardiac: Tachycardia Abdominal exam revealed normal bowel sounds. The abdomen was soft, non-tender, and without masses, organomegaly, or appreciable enlargement of the abdominal aorta. Examination of the skin revealed no evidence of significant rashes, suspicious appearing nevi or other concerning lesions. Neurologically, the patient was awake, alert, and oriented to person, place and time. There were no obvious focal neurologic abnormalities. Redness and tenderness in the left thigh has improved.   Indurated area has increased in size.  But no other redness no fever no swelling of lower extremity   Kyphoscoliosis: Osteoporosis and multiple vertebral compression fracture  Skin: Grade 1 rash LAB RESULTS:  Appointment on 12/11/2014  Component Date Value Ref Range Status  . WBC 12/11/2014 8.5  3.8 - 10.6 K/uL Final  . RBC 12/11/2014 3.73* 4.40 - 5.90 MIL/uL Final  . Hemoglobin 12/11/2014 13.2  13.0 - 18.0 g/dL Final  . HCT 12/11/2014 39.2* 40.0 - 52.0 %  Final  . MCV 12/11/2014 105.0* 80.0 - 100.0 fL Final  . MCH  12/11/2014 35.4* 26.0 - 34.0 pg Final  . MCHC 12/11/2014 33.7  32.0 - 36.0 g/dL Final  . RDW 12/11/2014 14.5  11.5 - 14.5 % Final  . Platelets 12/11/2014 228  150 - 440 K/uL Final  . Neutrophils Relative % 12/11/2014 82   Final  . Neutro Abs 12/11/2014 7.1* 1.4 - 6.5 K/uL Final  . Lymphocytes Relative 12/11/2014 14   Final  . Lymphs Abs 12/11/2014 1.1  1.0 - 3.6 K/uL Final  . Monocytes Relative 12/11/2014 3   Final  . Monocytes Absolute 12/11/2014 0.3  0.2 - 1.0 K/uL Final  . Eosinophils Relative 12/11/2014 0   Final  . Eosinophils Absolute 12/11/2014 0.0  0 - 0.7 K/uL Final  . Basophils Relative 12/11/2014 1   Final  . Basophils Absolute 12/11/2014 0.1  0 - 0.1 K/uL Final      STUDIES: Ct Pelvis W Contrast  11/19/2014   CLINICAL DATA:  Pain, swelling and erythema in the left groin and thigh for 1 week. No acute injury or prior relevant surgery. History of chronic lymphocytic leukemia.  EXAM: CT PELVIS AND BILATERAL LOWER EXTREMITIES WITH CONTRAST  TECHNIQUE: Multidetector CT imaging of the pelvis and bilateral lower extremities was performed according to the standard protocol following bolus administration of intravenous contrast. Multiplanar CT image reconstructions were also generated.  CONTRAST:  13mL OMNIPAQUE IOHEXOL 300 MG/ML  SOLN  COMPARISON:  Doppler ultrasound 11/15/2014. Abdominal CT 10/11/2014.  FINDINGS: CT PELVIS FINDINGS  No intrapelvic inflammatory changes or fluid collections demonstrated. There is moderate stool throughout the colon. The appendix appears normal.  There is aortoiliac atherosclerosis and moderate enlargement of the prostate gland. The bladder appears unremarkable. Bilateral scrotal hydroceles noted.  There are postsurgical changes in the left inguinal region without evidence of significant recurrent hernia.  The bones are demineralized without acute findings. 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 Patient is status post spinal augmentation at L4 and  L5.  CT BILATERAL LOWER EXTREMITY FINDINGS  There is asymmetric enlargement of the left thigh with diffuse dermal thickening. There is asymmetric subcutaneous edema in the proximal thigh anteriorly and medially. More distally, this edema is more circumferential. No focal fluid collections are identified. Edema extends into the fascia within the posterior compartment. No intramuscular fluid collections identified.  There are probable reactive lymph nodes within the left groin. There is no significant hip joint effusion.  Diffuse vascular calcifications are noted throughout the femoral arteries. The left femoral vein is opacified and appears patent. There is limited contrast material within the right femoral vein. No DVT demonstrated.  There is no evidence of femoral bone destruction, acute fracture or dislocation.  IMPRESSION: 1. Findings are consistent with left thigh cellulitis and possible fasciitis. No focal fluid collection to suggest abscess. 2. No evidence of osteomyelitis. 3. No significant intrapelvic inflammatory changes. 4. Diffuse atherosclerosis with asymmetric femoral vein enhancement (decreased on the right). This asymmetry may be secondary to hyperemia on the left. 5. Bilateral scrotal hydroceles.   Electronically Signed   By: Richardean Sale M.D.   On: 11/19/2014 18:59   US Venous Img Lower Unilateral Left  11/15/2014   CLINICAL DATA:  Left lower extremity edema and erythema for 4 days  EXAM: LEFT LOWER EXTREMITY VENOUS DOPPLER ULTRASOUND  TECHNIQUE: Gray-scale sonography with graded compression, as well as color Doppler and duplex ultrasound were performed to evaluate the lower extremity deep venous systems  from the level of the common femoral vein and including the common femoral, femoral, profunda femoral, popliteal and calf veins including the posterior tibial, peroneal and gastrocnemius veins when visible. The superficial great saphenous vein was also interrogated. Spectral Doppler was  utilized to evaluate flow at rest and with distal augmentation maneuvers in the common femoral, femoral and popliteal veins.  COMPARISON:  None.  FINDINGS: Contralateral Common Femoral Vein: Respiratory phasicity is normal and symmetric with the symptomatic side. No evidence of thrombus. Normal compressibility.  Common Femoral Vein: No evidence of thrombus. Normal compressibility, respiratory phasicity and response to augmentation.  Saphenofemoral Junction: No evidence of thrombus. Normal compressibility and flow on color Doppler imaging.  Profunda Femoral Vein: No evidence of thrombus. Normal compressibility and flow on color Doppler imaging.  Femoral Vein: No evidence of thrombus. Normal compressibility, respiratory phasicity and response to augmentation.  Popliteal Vein: No evidence of thrombus. Normal compressibility, respiratory phasicity and response to augmentation.  Calf Veins: No evidence of thrombus. Normal compressibility and flow on color Doppler imaging.  Superficial Great Saphenous Vein: No evidence of thrombus. Normal compressibility and flow on color Doppler imaging.  Venous Reflux:  None.  Other Findings: Multiple prominent left inguinal lymph nodes the largest measuring 21 mm in greatest diameter. These demonstrate fatty hila with no significant cortical thickening and are presumably reactive.  IMPRESSION: No evidence of deep venous thrombosis.   Electronically Signed   By: Skipper Cliche M.D.   On: 11/15/2014 12:34   [place] Extrem Low Left Ltd  11/22/2014   CLINICAL DATA:  Focal swelling in the left medial thigh extending posteriorly  EXAM: ULTRASOUND left LOWER EXTREMITY LIMITED  TECHNIQUE: Ultrasound examination of the lower extremity soft tissues was performed in the area of clinical concern.  COMPARISON:  11/19/2014  FINDINGS: Diffuse subcutaneous edema is noted similar to that seen on the recent CT examination. Scattered subcutaneous fluid is identified although no focal definitive abscess is  seen.  IMPRESSION: Changes most consistent with cellulitis without definitive focal abscess.   Electronically Signed   By: Inez Catalina M.D.   On: 11/22/2014 20:26   Ct Extrem Lower W Cm Bil  11/19/2014   CLINICAL DATA:  Pain, swelling and erythema in the left groin and thigh for 1 week. No acute injury or prior relevant surgery. History of chronic lymphocytic leukemia.  EXAM: CT PELVIS AND BILATERAL LOWER EXTREMITIES WITH CONTRAST  TECHNIQUE: Multidetector CT imaging of the pelvis and bilateral lower extremities was performed according to the standard protocol following bolus administration of intravenous contrast. Multiplanar CT image reconstructions were also generated.  CONTRAST:  128mL OMNIPAQUE IOHEXOL 300 MG/ML  SOLN  COMPARISON:  Doppler ultrasound 11/15/2014. Abdominal CT 10/11/2014.  FINDINGS: CT PELVIS FINDINGS  No intrapelvic inflammatory changes or fluid collections demonstrated. There is moderate stool throughout the colon. The appendix appears normal.  There is aortoiliac atherosclerosis and moderate enlargement of the prostate gland. The bladder appears unremarkable. Bilateral scrotal hydroceles noted.  There are postsurgical changes in the left inguinal region without evidence of significant recurrent hernia.  The bones are demineralized without acute findings. 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 Patient is status post spinal augmentation at L4 and L5.  CT BILATERAL LOWER EXTREMITY FINDINGS  There is asymmetric enlargement of the left thigh with diffuse dermal thickening. There is asymmetric subcutaneous edema in the proximal thigh anteriorly and medially. More distally, this edema is more circumferential. No focal fluid collections are identified. Edema extends into the fascia within the  posterior compartment. No intramuscular fluid collections identified.  There are probable reactive lymph nodes within the left groin. There is no significant hip joint effusion.  Diffuse vascular  calcifications are noted throughout the femoral arteries. The left femoral vein is opacified and appears patent. There is limited contrast material within the right femoral vein. No DVT demonstrated.  There is no evidence of femoral bone destruction, acute fracture or dislocation.  IMPRESSION: 1. Findings are consistent with left thigh cellulitis and possible fasciitis. No focal fluid collection to suggest abscess. 2. No evidence of osteomyelitis. 3. No significant intrapelvic inflammatory changes. 4. Diffuse atherosclerosis with asymmetric femoral vein enhancement (decreased on the right). This asymmetry may be secondary to hyperemia on the left. 5. Bilateral scrotal hydroceles.   Electronically Signed   By: Richardean Sale M.D.   On: 11/19/2014 18:59    ASSESSMENT: Autoimmune hemolytic anemia with lymphoproliferative disease On prednisone 10 mg by mouth daily hemoglobin is 13 g 2.  Because of increasing the swelling and redness we will go back on doxycycline and Augmentin and reevaluate patient in 7 days patient was instructed to call me if Center colitis and redness spreads.   MEDICAL DECISION MAKING:  Cellulitis of lower extremity Gradually improving. Continue antibiotic for 7 more days    No matching staging information was found for the patient.  Forest Gleason, MD   12/11/2014 11:33 AM

## 2014-12-11 NOTE — Progress Notes (Signed)
Name: [name] [name]   MRN: [id number]    DOB: 04/28/1929   Date:12/11/2014       Progress Note  Subjective  Chief Complaint  Chief Complaint  Patient presents with  . Insomnia  . Depression  . Back Pain  . Rash    wants Nystatin cream filled    Insomnia Primary symptoms: fragmented sleep, no sleep disturbance, no difficulty falling asleep, no somnolence, no frequent awakening, no premature morning awakening, no malaise/fatigue, no napping.   The current episode started more than one year. The onset quality is gradual. The problem has been gradually improving since onset. Exacerbated by: situational disturbance. PMH includes: associated symptoms present, hypertension, depression, no family stress or anxiety, no work related stressors, chronic pain, no apnea.  Depression        The patient presents with no depression.  This is a chronic problem.  The current episode started more than 1 year ago.   The onset quality is gradual.   The problem has been gradually improving since onset.  Associated symptoms include insomnia.  Associated symptoms include no decreased concentration, no fatigue, no helplessness, no hopelessness, not irritable, no restlessness, no decreased interest, no appetite change, no body aches, no myalgias, no headaches, no indigestion, not sad and no suicidal ideas.  Past treatments include SSRIs - Selective serotonin reuptake inhibitors.  Compliance with treatment is good.  Past medical history includes chronic pain.     Pertinent negatives include no chronic fatigue syndrome, no fibromyalgia, no hypothyroidism, no thyroid problem, no chronic illness, no recent illness, no life-threatening condition, no physical disability, no terminal illness, no recent psychiatric admission, no Alzheimer's disease, no brain trauma, no dementia, no anxiety, no bipolar disorder, no eating disorder, no depression, no mental health disorder, no obsessive-compulsive disorder, no post-traumatic stress  disorder, no schizophrenia, no suicide attempts and no head trauma. Back Pain This is a recurrent problem. The current episode started yesterday. The problem occurs daily. The problem has been waxing and waning since onset. The pain is present in the lumbar spine and thoracic spine. The pain is severe. Pertinent negatives include no abdominal pain, bladder incontinence, bowel incontinence, chest pain, dysuria, fever, headaches, leg pain, numbness, paresis, paresthesias, pelvic pain, perianal numbness, tingling, weakness or weight loss.  Rash This is a recurrent problem. The current episode started more than 1 year ago. The problem has been waxing and waning since onset. The affected locations include the groin. The rash is characterized by itchiness and redness. Pertinent negatives include no anorexia, congestion, cough, diarrhea, eye pain, facial edema, fatigue, fever, joint pain, nail changes, rhinorrhea, shortness of breath, sore throat or vomiting.    No problem-specific assessment & plan notes found for this encounter.   Past Medical History  Diagnosis Date  . CLL (chronic lymphoblastic leukemia) 2006    Rituxan treatment 2006  . Atrial septal aneurysm 2009    Insignificant, no, January, 2009  . Infection of PEG site     cellulitis.Marland KitchenResolved  . Vertebral compression fracture     multiple levels  . Herpes zoster     right chest  . Aortic insufficiency 2009    mild, Mild, echo, 2012  . Diastolic dysfunction     mild  . Chest pain     EF 55%, echo, October, 2009, possible inferior and posterior borderline hypokinesis, patient has never  had cardiac catheterization  . Edema     EF 55%, echo, October, 2009, possible borderline inferior and posterior hypokinesis... heart catheterization  has never been done(July 10, 2009)  . Myositis     Caused pulmonary insufficiency with muscle weakness in the past / Dr.Willis-neurology, Imuran and prednisone  . Calculus of kidney   . Inguinal hernia  without mention of obstruction or gangrene, unilateral or unspecified, (not specified as recurrent)     right   . Unspecified gastritis and gastroduodenitis without mention of hemorrhage   . Abdominal pain, epigastric   . Nonspecific elevation of levels of transaminase or lactic acid dehydrogenase (LDH)   . Feeding difficulties and mismanagement   . Edema of male genital organs     Resolved, occurred during illness related to myositis  . Swelling of arm     left.Marland KitchenMarland KitchenResolved.... no DVT  . LFT elevation     Related to Imuran in the past... dose was adjusted  . Dyslipidemia     statins not used due to LFT abnormalities  . Shortness of breath     Breathing abnormalities related to muscle weakness from myositis  . RBBB (right bundle branch block)     Old  . Bradycardia     Sinus bradycardia, old  . Ejection fraction     EF 60%, echo, October, 2012  . Mitral regurgitation     Mild / moderate, echo, 2012  . Right ventricular dysfunction     Mild, echo, 2012  . Febrile illness     May, 2013, question sinusitis  . Polymyositis 11/26/2012    Past Surgical History  Procedure Laterality Date  . Splenectomy    . Hernia repair      left side  . Peg placement  06/2007  . Peg tube removal  12/2007  . Kyphosis surgery      multilevel vertebral  . Odontoid fracture surgery      anterior screw fixation  . Nasal sinus surgery      x2  . Cataract extraction      bi lateral    Family History  Problem Relation Age of Onset  . Breast cancer Sister   . Colon cancer Brother   . Prostate cancer Brother   . Stroke Mother   . Hypertension Mother   . Emphysema Father   . Diabetes Grandchild   . Heart attack Neg Hx   . Cancer Brother   . Cancer Sister   . Cancer Daughter     Social History   Social History  . Marital Status: Widowed    Spouse Name: N/A  . Number of Children: 4  . Years of Education: college   Occupational History  . retired    Social History Main Topics  .  Smoking status: Former Smoker -- 0.50 packs/day for 31 years    Types: Cigarettes    Quit date: 06/29/1968  . Smokeless tobacco: Former Systems developer    Types: Chew  . Alcohol Use: No     Comment: rarely  . Drug Use: No  . Sexual Activity: No   Other Topics Concern  . Not on file   Social History Narrative   Patient is widowed and lives alone.   Patient is retired.   Patient has four adult children.   Patient has a college degree.   Patient is right-handed.   Patient drinks one cup of coffee daily.    Allergies  Allergen Reactions  . Sulfonamide Derivatives      Review of Systems  Constitutional: Negative for fever, chills, weight loss, malaise/fatigue, appetite change and fatigue.  HENT: Negative for congestion,  ear discharge, ear pain, rhinorrhea and sore throat.   Eyes: Negative for blurred vision and pain.  Respiratory: Negative for apnea, cough, sputum production, shortness of breath and wheezing.   Cardiovascular: Negative for chest pain, palpitations and leg swelling.  Gastrointestinal: Negative for heartburn, nausea, vomiting, abdominal pain, diarrhea, constipation, blood in stool, melena, anorexia and bowel incontinence.  Genitourinary: Negative for bladder incontinence, dysuria, urgency, frequency, hematuria and pelvic pain.  Musculoskeletal: Positive for back pain. Negative for myalgias, joint pain and neck pain.  Skin: Positive for rash. Negative for nail changes.  Neurological: Negative for dizziness, tingling, sensory change, focal weakness, weakness, numbness, headaches and paresthesias.  Endo/Heme/Allergies: Negative for environmental allergies and polydipsia. Does not bruise/bleed easily.  Psychiatric/Behavioral: Positive for depression. Negative for suicidal ideas, sleep disturbance and decreased concentration. The patient has insomnia. The patient is not nervous/anxious.      Objective  Filed Vitals:   12/11/14 0921  BP: 120/64  Pulse: 64  Height: 5\' 5"   (1.651 m)  Weight: 127 lb (57.607 kg)    Physical Exam  Constitutional: He is oriented to person, place, and time and well-developed, well-nourished, and in no distress. He is not irritable.  HENT:  Head: Normocephalic.  Right Ear: External ear normal.  Left Ear: External ear normal.  Nose: Nose normal.  Mouth/Throat: Oropharynx is clear and moist.  Eyes: Conjunctivae and EOM are normal. Pupils are equal, round, and reactive to light. Right eye exhibits no discharge. Left eye exhibits no discharge. No scleral icterus.  Neck: Normal range of motion. Neck supple. No JVD present. No tracheal deviation present. No thyromegaly present.  Cardiovascular: Normal rate, regular rhythm, normal heart sounds and intact distal pulses.  Exam reveals no gallop and no friction rub.   No murmur heard. Pulmonary/Chest: Breath sounds normal. No respiratory distress. He has no wheezes. He has no rales.  Abdominal: Soft. Bowel sounds are normal. He exhibits no mass. There is no hepatosplenomegaly. There is no tenderness. There is no rebound, no guarding and no CVA tenderness.  Musculoskeletal: Normal range of motion. He exhibits no edema or tenderness.  Lymphadenopathy:    He has no cervical adenopathy.  Neurological: He is alert and oriented to person, place, and time. He has normal sensation, normal strength, normal reflexes and intact cranial nerves. No cranial nerve deficit.  Skin: Skin is warm. Rash noted. There is erythema.  induration  Psychiatric: Mood and affect normal.  Nursing note and vitals reviewed.     Assessment & Plan  Problem List Items Addressed This Visit      Musculoskeletal and Integument   Polymyositis (Chronic)   Relevant Medications   HYDROcodone-acetaminophen (NORCO/VICODIN) 5-325 MG per tablet   DDD (degenerative disc disease), lumbosacral - Primary   Relevant Medications   HYDROcodone-acetaminophen (NORCO/VICODIN) 5-325 MG per tablet    Other Visit Diagnoses     Depression        Relevant Medications    citalopram (CELEXA) 40 MG tablet    Insomnia        Relevant Medications    temazepam (RESTORIL) 30 MG capsule    Candidiasis of urogenital site        nystatin         Dr. Deanna Jones [facility]  12/11/2014

## 2014-12-11 NOTE — Progress Notes (Signed)
Patient does not have living will.  Former smoker. 

## 2014-12-18 ENCOUNTER — Ambulatory Visit (INDEPENDENT_AMBULATORY_CARE_PROVIDER_SITE_OTHER): Payer: Medicare Other | Admitting: Neurology

## 2014-12-18 ENCOUNTER — Inpatient Hospital Stay: Payer: Medicare Other

## 2014-12-18 ENCOUNTER — Encounter: Payer: Self-pay | Admitting: Neurology

## 2014-12-18 ENCOUNTER — Inpatient Hospital Stay (HOSPITAL_BASED_OUTPATIENT_CLINIC_OR_DEPARTMENT_OTHER): Payer: Medicare Other | Admitting: Oncology

## 2014-12-18 VITALS — BP 140/73 | HR 68 | Ht 65.0 in | Wt 128.0 lb

## 2014-12-18 VITALS — BP 145/79 | HR 64 | Temp 95.7°F | Wt 127.5 lb

## 2014-12-18 DIAGNOSIS — E538 Deficiency of other specified B group vitamins: Secondary | ICD-10-CM

## 2014-12-18 DIAGNOSIS — D591 Other autoimmune hemolytic anemias: Secondary | ICD-10-CM | POA: Diagnosis not present

## 2014-12-18 DIAGNOSIS — R609 Edema, unspecified: Secondary | ICD-10-CM

## 2014-12-18 DIAGNOSIS — Z87442 Personal history of urinary calculi: Secondary | ICD-10-CM

## 2014-12-18 DIAGNOSIS — M3320 Polymyositis, organ involvement unspecified: Secondary | ICD-10-CM

## 2014-12-18 DIAGNOSIS — Z79899 Other long term (current) drug therapy: Secondary | ICD-10-CM

## 2014-12-18 DIAGNOSIS — C9110 Chronic lymphocytic leukemia of B-cell type not having achieved remission: Secondary | ICD-10-CM

## 2014-12-18 DIAGNOSIS — Z803 Family history of malignant neoplasm of breast: Secondary | ICD-10-CM

## 2014-12-18 DIAGNOSIS — L03116 Cellulitis of left lower limb: Secondary | ICD-10-CM

## 2014-12-18 DIAGNOSIS — I4510 Unspecified right bundle-branch block: Secondary | ICD-10-CM

## 2014-12-18 DIAGNOSIS — Z8711 Personal history of peptic ulcer disease: Secondary | ICD-10-CM

## 2014-12-18 DIAGNOSIS — I519 Heart disease, unspecified: Secondary | ICD-10-CM

## 2014-12-18 DIAGNOSIS — Z809 Family history of malignant neoplasm, unspecified: Secondary | ICD-10-CM

## 2014-12-18 DIAGNOSIS — Z9081 Acquired absence of spleen: Secondary | ICD-10-CM

## 2014-12-18 DIAGNOSIS — Z87311 Personal history of (healed) other pathological fracture: Secondary | ICD-10-CM

## 2014-12-18 DIAGNOSIS — I340 Nonrheumatic mitral (valve) insufficiency: Secondary | ICD-10-CM

## 2014-12-18 DIAGNOSIS — N400 Enlarged prostate without lower urinary tract symptoms: Secondary | ICD-10-CM

## 2014-12-18 DIAGNOSIS — I708 Atherosclerosis of other arteries: Secondary | ICD-10-CM

## 2014-12-18 DIAGNOSIS — Z800 Family history of malignant neoplasm of digestive organs: Secondary | ICD-10-CM

## 2014-12-18 DIAGNOSIS — F17210 Nicotine dependence, cigarettes, uncomplicated: Secondary | ICD-10-CM

## 2014-12-18 DIAGNOSIS — N433 Hydrocele, unspecified: Secondary | ICD-10-CM

## 2014-12-18 LAB — COMPREHENSIVE METABOLIC PANEL
ALBUMIN: 3.9 g/dL (ref 3.5–5.0)
ALT: 15 U/L — ABNORMAL LOW (ref 17–63)
AST: 29 U/L (ref 15–41)
Alkaline Phosphatase: 49 U/L (ref 38–126)
Anion gap: 8 (ref 5–15)
BUN: 18 mg/dL (ref 6–20)
CHLORIDE: 102 mmol/L (ref 101–111)
CO2: 27 mmol/L (ref 22–32)
Calcium: 9.1 mg/dL (ref 8.9–10.3)
Creatinine, Ser: 0.76 mg/dL (ref 0.61–1.24)
GFR calc Af Amer: >60 mL/min (ref >60)
GLUCOSE: 139 mg/dL — AB (ref 65–99)
POTASSIUM: 4.5 mmol/L (ref 3.5–5.1)
SODIUM: 137 mmol/L (ref 135–145)
Total Bilirubin: 1.7 mg/dL — ABNORMAL HIGH (ref 0.3–1.2)
Total Protein: 6.4 g/dL — ABNORMAL LOW (ref 6.5–8.1)

## 2014-12-18 LAB — CBC WITH DIFFERENTIAL/PLATELET
BASOS ABS: 0.1 10*3/uL (ref 0–0.1)
BASOS PCT: 1 %
EOS PCT: 0 %
Eosinophils Absolute: 0 10*3/uL (ref 0–0.7)
HCT: 39.9 % — ABNORMAL LOW (ref 40.0–52.0)
Hemoglobin: 13.6 g/dL (ref 13.0–18.0)
Lymphocytes Relative: 21 %
Lymphs Abs: 1.7 10*3/uL (ref 1.0–3.6)
MCH: 35.1 pg — ABNORMAL HIGH (ref 26.0–34.0)
MCHC: 34.1 g/dL (ref 32.0–36.0)
MCV: 102.9 fL — ABNORMAL HIGH (ref 80.0–100.0)
MONO ABS: 0.5 10*3/uL (ref 0.2–1.0)
Monocytes Relative: 6 %
Neutro Abs: 5.7 10*3/uL (ref 1.4–6.5)
Neutrophils Relative %: 72 %
PLATELETS: 213 10*3/uL (ref 150–440)
RBC: 3.88 MIL/uL — ABNORMAL LOW (ref 4.40–5.90)
RDW: 14.7 % — AB (ref 11.5–14.5)
WBC: 8 10*3/uL (ref 3.8–10.6)

## 2014-12-18 MED ORDER — DOXYCYCLINE HYCLATE 100 MG PO TABS: 100.0000 mg | ORAL_TABLET | Freq: Two times a day (BID) | ORAL | Status: DC

## 2014-12-18 NOTE — Progress Notes (Signed)
Reason for visit: Polymyositis  [name] [name] is an 81 y.o. male  History of present illness:  [name] [name] is an 81 year old right-handed white male with a history of polymyositis that has been relatively stable. The patient is on a low dose of CellCept taking 500 mg daily. The patient recently has had issues with a hemolytic anemia associated with CLL, he has been treated with rituximab. He is recovering from the hemolytic anemia, he had been on high-dose prednisone at 80 mg daily, he has tapered down to 10 mg daily. He has done well with his polymyositis, and no increased weakness has been noted. He denies any problems with speech or swallowing. He has no weakness of the arms or legs. He has had a bout of cellulitis in the leg, this required antibiotic therapy. He has had less generalized fatigue as the blood count has recovered.  Past Medical History  Diagnosis Date  . CLL (chronic lymphoblastic leukemia) 2006    Rituxan treatment 2006  . Atrial septal aneurysm 2009    Insignificant, no, January, 2009  . Infection of PEG site     cellulitis.Marland KitchenResolved  . Vertebral compression fracture     multiple levels  . Herpes zoster     right chest  . Aortic insufficiency 2009    mild, Mild, echo, 2012  . Diastolic dysfunction     mild  . Chest pain     EF 55%, echo, October, 2009, possible inferior and posterior borderline hypokinesis, patient has never  had cardiac catheterization  . Edema     EF 55%, echo, October, 2009, possible borderline inferior and posterior hypokinesis... heart catheterization has never been done(July 10, 2009)  . Myositis     Caused pulmonary insufficiency with muscle weakness in the past / Dr.Willis-neurology, Imuran and prednisone  . Calculus of kidney   . Inguinal hernia without mention of obstruction or gangrene, unilateral or unspecified, (not specified as recurrent)     right   . Unspecified gastritis and gastroduodenitis without mention of hemorrhage   .  Abdominal pain, epigastric   . Nonspecific elevation of levels of transaminase or lactic acid dehydrogenase (LDH)   . Feeding difficulties and mismanagement   . Edema of male genital organs     Resolved, occurred during illness related to myositis  . Swelling of arm     left.Marland KitchenMarland KitchenResolved.... no DVT  . LFT elevation     Related to Imuran in the past... dose was adjusted  . Dyslipidemia     statins not used due to LFT abnormalities  . Shortness of breath     Breathing abnormalities related to muscle weakness from myositis  . RBBB (right bundle branch block)     Old  . Bradycardia     Sinus bradycardia, old  . Ejection fraction     EF 60%, echo, October, 2012  . Mitral regurgitation     Mild / moderate, echo, 2012  . Right ventricular dysfunction     Mild, echo, 2012  . Febrile illness     May, 2013, question sinusitis  . Polymyositis 11/26/2012  . Hemolytic anemia   . Cellulitis     Past Surgical History  Procedure Laterality Date  . Splenectomy    . Hernia repair      left side  . Peg placement  06/2007  . Peg tube removal  12/2007  . Kyphosis surgery      multilevel vertebral  . Odontoid fracture surgery  anterior screw fixation  . Nasal sinus surgery      x2  . Cataract extraction      bi lateral    Family History  Problem Relation Age of Onset  . Breast cancer Sister   . Colon cancer Brother   . Prostate cancer Brother   . Stroke Mother   . Hypertension Mother   . Emphysema Father   . Diabetes Grandchild   . Heart attack Neg Hx   . Cancer Brother   . Cancer Sister   . Cancer Daughter     Social history:  reports that he quit smoking about 46 years ago. His smoking use included Cigarettes. He has a 15.5 pack-year smoking history. He has quit using smokeless tobacco. His smokeless tobacco use included Chew. He reports that he does not drink alcohol or use illicit drugs.    Allergies  Allergen Reactions  . Sulfonamide Derivatives     Medications:    Prior to Admission medications   Medication Sig Start Date End Date Taking? Authorizing Provider  amoxicillin-clavulanate (AUGMENTIN) 875-125 MG per tablet Take 1 tablet by mouth 2 (two) times daily. Dr Oliva Bustard 12/11/14  Yes Forest Gleason, MD  calcium-vitamin D (CALCIUM 500+D) 500-200 MG-UNIT per tablet Take 1 tablet by mouth 2 (two) times daily.     Yes Historical Provider, MD  Cholecalciferol (VITAMIN D3) 1000 UNITS tablet Take 1,000 Units by mouth daily.     Yes Historical Provider, MD  citalopram (CELEXA) 40 MG tablet Take 1 tablet (40 mg total) by mouth daily. 12/11/14  Yes Juline Patch, MD  cyanocobalamin (,VITAMIN B-12,) 1000 MCG/ML injection Inject 1,000 mcg into the muscle every 14 (fourteen) days. At the first of the month   Yes Historical Provider, MD  doxycycline (VIBRA-TABS) 100 MG tablet Take 1 tablet (100 mg total) by mouth 2 (two) times daily. 12/11/14  Yes Forest Gleason, MD  ferrous sulfate 325 (65 FE) MG tablet Take 325 mg by mouth daily with breakfast.     Yes Historical Provider, MD  fish oil-omega-3 fatty acids 1000 MG capsule Take 1 g by mouth daily at 12 noon.    Yes Historical Provider, MD  folic acid (FOLVITE) 1 MG tablet Take 4 tablets (4 mg total) by mouth daily. 10/14/14  Yes Shanker Kristeen Mans, MD  furosemide (LASIX) 80 MG tablet Take 80 mg by mouth.   Yes Historical Provider, MD  HYDROcodone-acetaminophen (NORCO/VICODIN) 5-325 MG per tablet Take 1 tablet by mouth every 6 (six) hours as needed. 12/11/14  Yes Juline Patch, MD  montelukast (SINGULAIR) 10 MG tablet Take 10 mg by mouth at bedtime.  08/21/14  Yes Historical Provider, MD  Multiple Vitamin (MULTIVITAMIN) capsule Take 1 capsule by mouth daily.     Yes Historical Provider, MD  mycophenolate (CELLCEPT) 500 MG tablet Take 1 tablet (500 mg total) by mouth daily. 09/19/14  Yes Kathrynn Ducking, MD  pantoprazole (PROTONIX) 40 MG tablet Take 1 tablet (40 mg total) by mouth daily at 12 noon. 11/28/14  Yes Evlyn Kanner, NP   potassium chloride SA (K-DUR,KLOR-CON) 20 MEQ tablet Take 20 mEq by mouth daily. Mon and Fri he takes BID 12/06/14  Yes Historical Provider, MD  predniSONE (DELTASONE) 20 MG tablet Take 0.5 tablets (10 mg total) by mouth daily with supper. Patient taking differently: Take 10 mg by mouth daily with supper. Dr Oliva Bustard 12/01/14  Yes Evlyn Kanner, NP  PROAIR HFA 108 (90 BASE) MCG/ACT inhaler Inhale 2  puffs into the lungs 4 (four) times daily.  08/21/14  Yes Historical Provider, MD  Resource Beneprotein PACK Take 3g three times a day   Yes Historical Provider, MD  temazepam (RESTORIL) 30 MG capsule Take 1 capsule (30 mg total) by mouth at bedtime. 12/11/14  Yes Juline Patch, MD    ROS:  Out of a complete 14 system review of symptoms, the patient complains only of the following symptoms, and all other reviewed systems are negative.  Ringing in the ears Shortness of breath Leg swelling Insomnia Joint pain  Blood pressure 140/73, pulse 68, height 5\' 5"  (1.651 m), weight 128 lb (58.06 kg).  Physical Exam  General: The patient is alert and cooperative at the time of the examination.  Skin: No significant peripheral edema is noted.   Neurologic Exam  Mental status: The patient is alert and oriented x 3 at the time of the examination. The patient has apparent normal recent and remote memory, with an apparently normal attention span and concentration ability.   Cranial nerves: Facial symmetry is present. Speech is normal, no aphasia or dysarthria is noted. Extraocular movements are full. Visual fields are full.  Motor: The patient has good strength in all 4 extremities.  Sensory examination: Soft touch sensation is symmetric on the face, arms, and legs.  Coordination: The patient has good finger-nose-finger and heel-to-shin bilaterally.  Gait and station: The patient has a normal gait. Tandem gait is unsteady. Romberg is negative. No drift is seen. The patient is able to arise from a  seated position with arms crossed.  Reflexes: Deep tendon reflexes are symmetric.   Assessment/Plan:  1. Polymyositis  2. CLL  The patient is doing better at this time. We will continue the CellCept at 500 mg daily. He has had recent blood work to include a CBC and comprehensive metabolic profile, blood work will not be done today. He will follow-up in 6 months, sooner if needed.  Jill Alexanders MD 12/18/2014 7:00 PM  [facility] [address] [place], [place] [address]  Phone [phone] Fax [phone]

## 2014-12-18 NOTE — Patient Instructions (Signed)
Polymyositis Polymyositis is one type of inflammatory myopathy. Inflammatory myopathies are muscle diseases that involve redness, soreness, and swelling (inflammation) of the muscles. Polymyositis causes decreased muscle power. It affects the connective tissues of the body (connective tissue disease). It is often associated with diseases in which the body attacks its own cells (autoimmune diseases). Polymyositis begins gradually. It generally affects adults in their 30s to 50s. CAUSES  The cause is often unknown. Some cases are caused by a bacteria, virus, or parasite infection. This can trigger autoantibodies, which attack normal, healthy cells. SYMPTOMS   The most common symptom is muscle weakness. This often affects muscles close to the trunk of the body (proximal). Muscles not close to the trunk (distal) may also be affected later on in the disease.  Eventually, patients have trouble:  Rising from a sitting position.  Climbing stairs.  Lifting objects.  Reaching overhead.  Trouble swallowing (dysphagia) may occur.  Rarely, the muscles ache and are tender to touch. Associated conditions include:  Fingers, toes, nose, and ears that turn pale when exposed to cold (Raynaud's phenomenon).  Other connective tissue diseases. This may include lupus, rheumatoid arthritis, or scleroderma.  Heart disease. This is caused by inflammation of the heart muscle. DIAGNOSIS  Diagnosis can be difficult. It is based on symptoms and a thorough exam. Further testing may be done, including:  Blood tests to check for high levels of muscle enzymes, which suggest muscle damage, and specific autoantibodies.  Computerized magnetic scan (MRI).  Test to check electrical activity of the muscle (electromyography).  Exam of a muscle tissue sample (biopsy). TREATMENT  There is no cure. Treatment can improve muscle strength and function. The earlier treatment is started, the more effective it is. Early  treatment often means fewer complications.  Drug treatments may include:  Corticosteroids, especially prednisone. Patients often improve in about 2 to 4 weeks. However, drug therapy may be needed for years. Long-term use of corticosteroids can have serious side effects. Your caregiver may advise supplements or may prescribe other medicines.  Corticosteroid-sparing agents.  Intravenous immunoglobulin (IVIG). This contains healthy antibodies from blood donors.  Immunosuppressive therapies, such as tacrolimus.  Physical therapy is often advised. This helps stop muscles from wasting away (atrophy).  Chewing and swallowing can be more difficult later on in the disease. A registered dietitian can teach you how to make foods that are easy to eat.  Speech therapy can be helpful if your swallowing muscles are weakened. Results from therapy vary depending on the severity of the disease. Rarely, the disease may be life-threatening to patients with severe, progressive muscle weakness, dysphagia, malnutrition, pneumonia, or respiratory failure. HOME CARE INSTRUCTIONS   Stay active. An exercise routine can help you build and maintain muscle strength. It is important to get a detailed plan and recommendations from your caregiver or physical therapist before starting an exercise program.  Rest when you are tired. Do not wait until you are exhausted to rest. Learn to pace yourself. SEEK MEDICAL CARE IF:  You develop new or worsening muscle weakness. SEEK IMMEDIATE MEDICAL CARE IF:   You have trouble swallowing or speaking.  You develop shortness of breath. FOR MORE INFORMATION  The Myositis Association: www.myositis.org National Institute of Neurological Disorders and Stroke: www.ninds.nih.gov Document Released: 03/18/2002 Document Revised: 06/20/2011 Document Reviewed: 07/30/2009 Exit[employer] Patient Information 2015 [employer]. This information is not intended to replace advice given to you by  your health care provider. Make sure you discuss any questions you have with your health   care provider.  

## 2014-12-18 NOTE — Progress Notes (Signed)
Patient does not have living will.  Former smoker. 

## 2014-12-19 ENCOUNTER — Encounter: Payer: Self-pay | Admitting: Oncology

## 2014-12-19 NOTE — Progress Notes (Signed)
[place] @ [facility] Telephone:([phone]) [phone]  [phone]     [name] [name] OB: [birth year]  MR#: [id number]  TKW#:[id number]  Patient Care Team: Juline Patch, MD as PCP - General (Family Medicine) Kathrynn Ducking, MD (Neurology) Forest Gleason, MD as Consulting Physician (Unknown Physician Specialty) Carlena Bjornstad, MD as Consulting Physician (Cardiology) Seeplaputhur Robinette Haines, MD (General Surgery)  CHIEF COMPLAINT:  Chief Complaint  Patient presents with  . Follow-up   Oncology History   Chief Complaint/Problem List  1.  Autoimmune hemolytic anemia. Previously received Prednisone taper.   2.  Splenectomy in 5/02.  3.  Gastric ulcer diagnosed on endoscopy and multiple biopsies are pending, November 2005. 4.  Chronic lymphocytic leukemia. 5.vitamin B 12 deficiency 6.  Recent admission in the hospital in June of 2016 with hemolytic anemia and hemoglobin of 5.5 patient received blood transfusion was started on steroid 7.  Patient was started on rituximab (October 20, 2014        Oncology Flowsheet 11/22/2014 11/23/2014 11/24/2014 11/25/2014 11/26/2014 11/27/2014 11/28/2014  cyanocobalamin ((VITAMIN B-12)) IM - - - - - - -  enoxaparin (LOVENOX) [place] - 40 mg 40 mg 40 mg - 40 mg 40 mg  methylPREDNISolone sodium succinate 40 mg/mL (SOLU-MEDROL) IV - - - - - - -  predniSONE (DELTASONE) PO 10 mg 10 mg 10 mg 10 mg 10 mg 10 mg 10 mg  riTUXimab (RITUXAN) IV - - - - - - -  zolendronic acid (ZOMETA) IV - - - - - - -    INTERVAL HISTORY:   81 year old gentleman with a history of autoimmune hemolytic anemia and chronic lymphocytic leukemia Patient was recently discharged from the hospital because of cellulitis of the right thigh which is gradually improving.  On oral antibiotics with doxycycline and Augmentin. Patient also has been started on prednisone being tapered off hemoglobin has improved to 13 g. December 11, 2014 Patient came here for further follow-up regarding autoimmune  hemolytic anemia.  Cellulitis.  Appetite has been poor.  Left lower extremity swelling is stabile eyes redness persists that might be some increasing redness and swelling Patient is being closely followed by surgical service No chills.  No fever.. December 18, 2014 Patient is here for his weekly follow-up regarding cellulitis, autoimmune hemolytic anemia.  Cellulitis continues to improve still some recent redness and pain in the left thigh area.  Patient has finished Augmentin and doxycycline.   Patient is on 10 mg of prednisone every day for autoimmune hemolytic anemia No chills.  No fever.  Ambulation is improving. REVIEW OF SYSTEMS:    general status: Patient is feeling weak and tired.  No change in a performance status.  No chills.  No fever. HEENT:.  No evidence of stomatitis Lungs: No cough or shortness of breath Cardiac: No chest pain or paroxysmal nocturnal dyspnea GI: No nausea no vomiting no diarrhea no abdominal pain Skin: No rash Lower extremity redness and tenderness Improving Area of induration and redness is now localized underside of the left thigh . gradually improving Neurological system: No tingling.  No numbness.  No other focal signs Musculoskeletal system no bony pains  As per HPI. Otherwise, a complete review of systems is negatve.  PAST MEDICAL HISTORY: Past Medical History  Diagnosis Date  . CLL (chronic lymphoblastic leukemia) 2006    Rituxan treatment 2006  . Atrial septal aneurysm 2009    Insignificant, no, January, 2009  . Infection of PEG site     cellulitis.Marland KitchenResolved  .  Vertebral compression fracture     multiple levels  . Herpes zoster     right chest  . Aortic insufficiency 2009    mild, Mild, echo, 2012  . Diastolic dysfunction     mild  . Chest pain     EF 55%, echo, October, 2009, possible inferior and posterior borderline hypokinesis, patient has never  had cardiac catheterization  . Edema     EF 55%, echo, October, 2009, possible  borderline inferior and posterior hypokinesis... heart catheterization has never been done(July 10, 2009)  . Myositis     Caused pulmonary insufficiency with muscle weakness in the past / Dr.Willis-neurology, Imuran and prednisone  . Calculus of kidney   . Inguinal hernia without mention of obstruction or gangrene, unilateral or unspecified, (not specified as recurrent)     right   . Unspecified gastritis and gastroduodenitis without mention of hemorrhage   . Abdominal pain, epigastric   . Nonspecific elevation of levels of transaminase or lactic acid dehydrogenase (LDH)   . Feeding difficulties and mismanagement   . Edema of male genital organs     Resolved, occurred during illness related to myositis  . Swelling of arm     left.Marland KitchenMarland KitchenResolved.... no DVT  . LFT elevation     Related to Imuran in the past... dose was adjusted  . Dyslipidemia     statins not used due to LFT abnormalities  . Shortness of breath     Breathing abnormalities related to muscle weakness from myositis  . RBBB (right bundle branch block)     Old  . Bradycardia     Sinus bradycardia, old  . Ejection fraction     EF 60%, echo, October, 2012  . Mitral regurgitation     Mild / moderate, echo, 2012  . Right ventricular dysfunction     Mild, echo, 2012  . Febrile illness     May, 2013, question sinusitis  . Polymyositis 11/26/2012  . Hemolytic anemia   . Cellulitis     PAST SURGICAL HISTORY: Past Surgical History  Procedure Laterality Date  . Splenectomy    . Hernia repair      left side  . Peg placement  06/2007  . Peg tube removal  12/2007  . Kyphosis surgery      multilevel vertebral  . Odontoid fracture surgery      anterior screw fixation  . Nasal sinus surgery      x2  . Cataract extraction      bi lateral    FAMILY HISTORY Family History  Problem Relation Age of Onset  . Breast cancer Sister   . Colon cancer Brother   . Prostate cancer Brother   . Stroke Mother   . Hypertension Mother     . Emphysema Father   . Diabetes Grandchild   . Heart attack Neg Hx   . Cancer Brother   . Cancer Sister   . Cancer Daughter     ADVANCED DIRECTIVES:  Patient does have advance healthcare directive, Patient   does not desire to make any changes  HEALTH MAINTENANCE: Social History  Substance Use Topics  . Smoking status: Former Smoker -- 0.50 packs/day for 31 years    Types: Cigarettes    Quit date: 06/29/1968  . Smokeless tobacco: Former Systems developer    Types: Chew  . Alcohol Use: No     Comment: rarely      Allergies  Allergen Reactions  . Sulfonamide Derivatives  Current Outpatient Prescriptions  Medication Sig Dispense Refill  . amoxicillin-clavulanate (AUGMENTIN) 875-125 MG per tablet Take 1 tablet by mouth 2 (two) times daily. Dr Oliva Bustard 14 tablet 0  . calcium-vitamin D (CALCIUM 500+D) 500-200 MG-UNIT per tablet Take 1 tablet by mouth 2 (two) times daily.      . Cholecalciferol (VITAMIN D3) 1000 UNITS tablet Take 1,000 Units by mouth daily.      . citalopram (CELEXA) 40 MG tablet Take 1 tablet (40 mg total) by mouth daily. 90 tablet 1  . cyanocobalamin (,VITAMIN B-12,) 1000 MCG/ML injection Inject 1,000 mcg into the muscle every 14 (fourteen) days. At the first of the month    . doxycycline (VIBRA-TABS) 100 MG tablet Take 1 tablet (100 mg total) by mouth 2 (two) times daily. 14 tablet 0  . ferrous sulfate 325 (65 FE) MG tablet Take 325 mg by mouth daily with breakfast.      . fish oil-omega-3 fatty acids 1000 MG capsule Take 1 g by mouth daily at 12 noon.     . folic acid (FOLVITE) 1 MG tablet Take 4 tablets (4 mg total) by mouth daily. 120 tablet 0  . furosemide (LASIX) 80 MG tablet Take 80 mg by mouth.    Marland Kitchen HYDROcodone-acetaminophen (NORCO/VICODIN) 5-325 MG per tablet Take 1 tablet by mouth every 6 (six) hours as needed. 120 tablet 0  . montelukast (SINGULAIR) 10 MG tablet Take 10 mg by mouth at bedtime.   11  . Multiple Vitamin (MULTIVITAMIN) capsule Take 1 capsule by  mouth daily.      . mycophenolate (CELLCEPT) 500 MG tablet Take 1 tablet (500 mg total) by mouth daily. 90 tablet 3  . pantoprazole (PROTONIX) 40 MG tablet Take 1 tablet (40 mg total) by mouth daily at 12 noon. 30 tablet 0  . potassium chloride SA (K-DUR,KLOR-CON) 20 MEQ tablet Take 20 mEq by mouth daily. Mon and Fri he takes BID  2  . predniSONE (DELTASONE) 20 MG tablet Take 0.5 tablets (10 mg total) by mouth daily with supper. (Patient taking differently: Take 10 mg by mouth daily with supper. Dr Oliva Bustard) 90 tablet 0  . PROAIR HFA 108 (90 BASE) MCG/ACT inhaler Inhale 2 puffs into the lungs 4 (four) times daily.   11  . Resource Beneprotein PACK Take 3g three times a day    . temazepam (RESTORIL) 30 MG capsule Take 1 capsule (30 mg total) by mouth at bedtime. 30 capsule 5   No current facility-administered medications for this visit.    OBJECTIVE:  Filed Vitals:   12/18/14 1222  BP: 145/79  Pulse: 64  Temp: 95.7 F (35.4 C)     Body mass index is 21.22 kg/(m^2).    ECOG FS:1 - Symptomatic but completely ambulatory  PHYSICAL EXAM: Gen. status: Patient is alert oriented not any acute distress Lymphatic system: Supraclavicular, cervical, axillary, inguinal lymph nodes are not palpable Head exam was generally normal. There was no scleral icterus or corneal arcus. Mucous membranes were moist. Cardiac: Tachycardia Abdominal exam revealed normal bowel sounds. The abdomen was soft, non-tender, and without masses, organomegaly, or appreciable enlargement of the abdominal aorta. Examination of the skin revealed no evidence of significant rashes, suspicious appearing nevi or other concerning lesions. Neurologically, the patient was awake, alert, and oriented to person, place and time. There were no obvious focal neurologic abnormalities. Redness and tenderness in the left thigh has improved.   In the radiated area has improved. Kyphoscoliosis: Osteoporosis and multiple vertebral compression  fracture  Skin: Grade 1 rash LAB RESULTS:  Appointment on 12/18/2014  Component Date Value Ref Range Status  . WBC 12/18/2014 8.0  3.8 - 10.6 K/uL Final  . RBC 12/18/2014 3.88* 4.40 - 5.90 MIL/uL Final  . Hemoglobin 12/18/2014 13.6  13.0 - 18.0 g/dL Final  . HCT 12/18/2014 39.9* 40.0 - 52.0 % Final  . MCV 12/18/2014 102.9* 80.0 - 100.0 fL Final  . MCH 12/18/2014 35.1* 26.0 - 34.0 pg Final  . MCHC 12/18/2014 34.1  32.0 - 36.0 g/dL Final  . RDW 12/18/2014 14.7* 11.5 - 14.5 % Final  . Platelets 12/18/2014 213  150 - 440 K/uL Final  . Neutrophils Relative % 12/18/2014 72   Final  . Neutro Abs 12/18/2014 5.7  1.4 - 6.5 K/uL Final  . Lymphocytes Relative 12/18/2014 21   Final  . Lymphs Abs 12/18/2014 1.7  1.0 - 3.6 K/uL Final  . Monocytes Relative 12/18/2014 6   Final  . Monocytes Absolute 12/18/2014 0.5  0.2 - 1.0 K/uL Final  . Eosinophils Relative 12/18/2014 0   Final  . Eosinophils Absolute 12/18/2014 0.0  0 - 0.7 K/uL Final  . Basophils Relative 12/18/2014 1   Final  . Basophils Absolute 12/18/2014 0.1  0 - 0.1 K/uL Final  . Sodium 12/18/2014 137  135 - 145 mmol/L Final  . Potassium 12/18/2014 4.5  3.5 - 5.1 mmol/L Final  . Chloride 12/18/2014 102  101 - 111 mmol/L Final  . CO2 12/18/2014 27  22 - 32 mmol/L Final  . Glucose, Bld 12/18/2014 139* 65 - 99 mg/dL Final  . BUN 12/18/2014 18  6 - 20 mg/dL Final  . Creatinine, Ser 12/18/2014 0.76  0.61 - 1.24 mg/dL Final  . Calcium 12/18/2014 9.1  8.9 - 10.3 mg/dL Final  . Total Protein 12/18/2014 6.4* 6.5 - 8.1 g/dL Final  . Albumin 12/18/2014 3.9  3.5 - 5.0 g/dL Final  . AST 12/18/2014 29  15 - 41 U/L Final  . ALT 12/18/2014 15* 17 - 63 U/L Final  . Alkaline Phosphatase 12/18/2014 49  38 - 126 U/L Final  . Total Bilirubin 12/18/2014 1.7* 0.3 - 1.2 mg/dL Final  . GFR calc non Af Amer 12/18/2014 >60  >60 mL/min Final  . GFR calc Af Amer 12/18/2014 >60  >60 mL/min Final   Comment: (NOTE) The eGFR has been calculated using the CKD EPI  equation. This calculation has not been validated in all clinical situations. eGFR's persistently <60 mL/min signify possible Chronic Kidney Disease.   . Anion gap 12/18/2014 8  5 - 15 Final      STUDIES: Ct Pelvis W Contrast  11/19/2014   CLINICAL DATA:  Pain, swelling and erythema in the left groin and thigh for 1 week. No acute injury or prior relevant surgery. History of chronic lymphocytic leukemia.  EXAM: CT PELVIS AND BILATERAL LOWER EXTREMITIES WITH CONTRAST  TECHNIQUE: Multidetector CT imaging of the pelvis and bilateral lower extremities was performed according to the standard protocol following bolus administration of intravenous contrast. Multiplanar CT image reconstructions were also generated.  CONTRAST:  1103m OMNIPAQUE IOHEXOL 300 MG/ML  SOLN  COMPARISON:  Doppler ultrasound 11/15/2014. Abdominal CT 10/11/2014.  FINDINGS: CT PELVIS FINDINGS  No intrapelvic inflammatory changes or fluid collections demonstrated. There is moderate stool throughout the colon. The appendix appears normal.  There is aortoiliac atherosclerosis and moderate enlargement of the prostate gland. The bladder appears unremarkable. Bilateral scrotal hydroceles noted.  There are postsurgical changes in the  left inguinal region without evidence of significant recurrent hernia.  The bones are demineralized without acute findings. 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 Patient is status post spinal augmentation at L4 and L5.  CT BILATERAL LOWER EXTREMITY FINDINGS  There is asymmetric enlargement of the left thigh with diffuse dermal thickening. There is asymmetric subcutaneous edema in the proximal thigh anteriorly and medially. More distally, this edema is more circumferential. No focal fluid collections are identified. Edema extends into the fascia within the posterior compartment. No intramuscular fluid collections identified.  There are probable reactive lymph nodes within the left groin. There is no  significant hip joint effusion.  Diffuse vascular calcifications are noted throughout the femoral arteries. The left femoral vein is opacified and appears patent. There is limited contrast material within the right femoral vein. No DVT demonstrated.  There is no evidence of femoral bone destruction, acute fracture or dislocation.  IMPRESSION: 1. Findings are consistent with left thigh cellulitis and possible fasciitis. No focal fluid collection to suggest abscess. 2. No evidence of osteomyelitis. 3. No significant intrapelvic inflammatory changes. 4. Diffuse atherosclerosis with asymmetric femoral vein enhancement (decreased on the right). This asymmetry may be secondary to hyperemia on the left. 5. Bilateral scrotal hydroceles.   Electronically Signed   By: Richardean Sale M.D.   On: 11/19/2014 18:59   [place] Extrem Low Left Ltd  11/22/2014   CLINICAL DATA:  Focal swelling in the left medial thigh extending posteriorly  EXAM: ULTRASOUND left LOWER EXTREMITY LIMITED  TECHNIQUE: Ultrasound examination of the lower extremity soft tissues was performed in the area of clinical concern.  COMPARISON:  11/19/2014  FINDINGS: Diffuse subcutaneous edema is noted similar to that seen on the recent CT examination. Scattered subcutaneous fluid is identified although no focal definitive abscess is seen.  IMPRESSION: Changes most consistent with cellulitis without definitive focal abscess.   Electronically Signed   By: Inez Catalina M.D.   On: 11/22/2014 20:26   Ct Extrem Lower W Cm Bil  11/19/2014   CLINICAL DATA:  Pain, swelling and erythema in the left groin and thigh for 1 week. No acute injury or prior relevant surgery. History of chronic lymphocytic leukemia.  EXAM: CT PELVIS AND BILATERAL LOWER EXTREMITIES WITH CONTRAST  TECHNIQUE: Multidetector CT imaging of the pelvis and bilateral lower extremities was performed according to the standard protocol following bolus administration of intravenous contrast. Multiplanar CT  image reconstructions were also generated.  CONTRAST:  181m OMNIPAQUE IOHEXOL 300 MG/ML  SOLN  COMPARISON:  Doppler ultrasound 11/15/2014. Abdominal CT 10/11/2014.  FINDINGS: CT PELVIS FINDINGS  No intrapelvic inflammatory changes or fluid collections demonstrated. There is moderate stool throughout the colon. The appendix appears normal.  There is aortoiliac atherosclerosis and moderate enlargement of the prostate gland. The bladder appears unremarkable. Bilateral scrotal hydroceles noted.  There are postsurgical changes in the left inguinal region without evidence of significant recurrent hernia.  The bones are demineralized without acute findings. 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 Patient is status post spinal augmentation at L4 and L5.  CT BILATERAL LOWER EXTREMITY FINDINGS  There is asymmetric enlargement of the left thigh with diffuse dermal thickening. There is asymmetric subcutaneous edema in the proximal thigh anteriorly and medially. More distally, this edema is more circumferential. No focal fluid collections are identified. Edema extends into the fascia within the posterior compartment. No intramuscular fluid collections identified.  There are probable reactive lymph nodes within the left groin. There is no significant hip joint effusion.  Diffuse  vascular calcifications are noted throughout the femoral arteries. The left femoral vein is opacified and appears patent. There is limited contrast material within the right femoral vein. No DVT demonstrated.  There is no evidence of femoral bone destruction, acute fracture or dislocation.  IMPRESSION: 1. Findings are consistent with left thigh cellulitis and possible fasciitis. No focal fluid collection to suggest abscess. 2. No evidence of osteomyelitis. 3. No significant intrapelvic inflammatory changes. 4. Diffuse atherosclerosis with asymmetric femoral vein enhancement (decreased on the right). This asymmetry may be secondary to hyperemia  on the left. 5. Bilateral scrotal hydroceles.   Electronically Signed   By: Richardean Sale M.D.   On: 11/19/2014 18:59    ASSESSMENT: Autoimmune hemolytic anemia with lymphoproliferative disease.Marland Kitchen   On prednisone 10 mg by mouth daily hemoglobin is 13 g Prednisone is now being tapered off every other day and hemoglobin will be monitored 2.  Cellulitis of the thigh Improving in duration still persist redness has improved patient will be continued on doxycycline and reevaluation with surgical intervention planned on September 19 if needed   MEDICAL DECISION MAKING:  Cellulitis of lower extremity All lab data has been reviewed Gradually improving. Continue antibiotic for 7 more days Taper prednisone every other day    No matching staging information was found for the patient.  Forest Gleason, MD   12/19/2014 7:34 AM

## 2014-12-23 ENCOUNTER — Encounter: Payer: Self-pay | Admitting: Infectious Diseases

## 2014-12-25 ENCOUNTER — Other Ambulatory Visit: Payer: Self-pay | Admitting: *Deleted

## 2014-12-25 ENCOUNTER — Inpatient Hospital Stay (HOSPITAL_BASED_OUTPATIENT_CLINIC_OR_DEPARTMENT_OTHER): Payer: Medicare Other

## 2014-12-25 ENCOUNTER — Inpatient Hospital Stay (HOSPITAL_BASED_OUTPATIENT_CLINIC_OR_DEPARTMENT_OTHER): Payer: Medicare Other | Admitting: Oncology

## 2014-12-25 ENCOUNTER — Encounter: Payer: Self-pay | Admitting: Oncology

## 2014-12-25 VITALS — BP 136/71 | HR 55 | Temp 96.3°F | Resp 18 | Ht 65.0 in | Wt 130.1 lb

## 2014-12-25 DIAGNOSIS — C9110 Chronic lymphocytic leukemia of B-cell type not having achieved remission: Secondary | ICD-10-CM

## 2014-12-25 DIAGNOSIS — Z79899 Other long term (current) drug therapy: Secondary | ICD-10-CM

## 2014-12-25 DIAGNOSIS — I4510 Unspecified right bundle-branch block: Secondary | ICD-10-CM

## 2014-12-25 DIAGNOSIS — D591 Other autoimmune hemolytic anemias: Secondary | ICD-10-CM

## 2014-12-25 DIAGNOSIS — Z87442 Personal history of urinary calculi: Secondary | ICD-10-CM

## 2014-12-25 DIAGNOSIS — R609 Edema, unspecified: Secondary | ICD-10-CM

## 2014-12-25 DIAGNOSIS — Z803 Family history of malignant neoplasm of breast: Secondary | ICD-10-CM

## 2014-12-25 DIAGNOSIS — N400 Enlarged prostate without lower urinary tract symptoms: Secondary | ICD-10-CM

## 2014-12-25 DIAGNOSIS — M3320 Polymyositis, organ involvement unspecified: Secondary | ICD-10-CM

## 2014-12-25 DIAGNOSIS — D479 Neoplasm of uncertain behavior of lymphoid, hematopoietic and related tissue, unspecified: Secondary | ICD-10-CM

## 2014-12-25 DIAGNOSIS — N433 Hydrocele, unspecified: Secondary | ICD-10-CM

## 2014-12-25 DIAGNOSIS — Z800 Family history of malignant neoplasm of digestive organs: Secondary | ICD-10-CM

## 2014-12-25 DIAGNOSIS — I708 Atherosclerosis of other arteries: Secondary | ICD-10-CM

## 2014-12-25 DIAGNOSIS — I340 Nonrheumatic mitral (valve) insufficiency: Secondary | ICD-10-CM

## 2014-12-25 DIAGNOSIS — E538 Deficiency of other specified B group vitamins: Secondary | ICD-10-CM | POA: Diagnosis not present

## 2014-12-25 DIAGNOSIS — Z809 Family history of malignant neoplasm, unspecified: Secondary | ICD-10-CM

## 2014-12-25 DIAGNOSIS — Z8711 Personal history of peptic ulcer disease: Secondary | ICD-10-CM

## 2014-12-25 DIAGNOSIS — F17210 Nicotine dependence, cigarettes, uncomplicated: Secondary | ICD-10-CM

## 2014-12-25 DIAGNOSIS — L03116 Cellulitis of left lower limb: Secondary | ICD-10-CM | POA: Diagnosis not present

## 2014-12-25 DIAGNOSIS — I519 Heart disease, unspecified: Secondary | ICD-10-CM

## 2014-12-25 DIAGNOSIS — D594 Other nonautoimmune hemolytic anemias: Secondary | ICD-10-CM

## 2014-12-25 DIAGNOSIS — R202 Paresthesia of skin: Secondary | ICD-10-CM

## 2014-12-25 DIAGNOSIS — Z87311 Personal history of (healed) other pathological fracture: Secondary | ICD-10-CM

## 2014-12-25 DIAGNOSIS — Z9081 Acquired absence of spleen: Secondary | ICD-10-CM

## 2014-12-25 LAB — CBC WITH DIFFERENTIAL/PLATELET
BASOS ABS: 0.1 10*3/uL (ref 0–0.1)
Basophils Relative: 1 %
Eosinophils Absolute: 0.7 10*3/uL (ref 0–0.7)
Eosinophils Relative: 6 %
HCT: 37.3 % — ABNORMAL LOW (ref 40.0–52.0)
HEMOGLOBIN: 12.5 g/dL — AB (ref 13.0–18.0)
LYMPHS ABS: 4.6 10*3/uL — AB (ref 1.0–3.6)
Lymphocytes Relative: 43 %
MCH: 34.3 pg — ABNORMAL HIGH (ref 26.0–34.0)
MCHC: 33.6 g/dL (ref 32.0–36.0)
MCV: 102.3 fL — ABNORMAL HIGH (ref 80.0–100.0)
MONO ABS: 2.3 10*3/uL — AB (ref 0.2–1.0)
MONOS PCT: 21 %
NEUTROS PCT: 29 %
Neutro Abs: 3.2 10*3/uL (ref 1.4–6.5)
Platelets: 253 10*3/uL (ref 150–440)
RBC: 3.65 MIL/uL — AB (ref 4.40–5.90)
RDW: 14.6 % — AB (ref 11.5–14.5)
Smear Review: ADEQUATE
WBC: 10.9 10*3/uL — AB (ref 3.8–10.6)

## 2014-12-25 MED ORDER — PREDNISONE 20 MG PO TABS: ORAL_TABLET | ORAL | Status: DC

## 2014-12-25 MED ORDER — PANTOPRAZOLE SODIUM 40 MG PO TBEC: 40.0000 mg | DELAYED_RELEASE_TABLET | Freq: Every day | ORAL | Status: DC

## 2014-12-25 MED ORDER — DOXYCYCLINE HYCLATE 100 MG PO TABS: 100.0000 mg | ORAL_TABLET | Freq: Two times a day (BID) | ORAL | Status: DC

## 2014-12-25 NOTE — Progress Notes (Signed)
[place] @ [facility] [phone]  [phone]     Rai Severns OB: [birth year]  MR#: [id number]  GYF#:[id number]  Patient Care Team: Juline Patch, MD as PCP - General (Family Medicine) Kathrynn Ducking, MD (Neurology) Forest Gleason, MD as Consulting Physician (Unknown Physician Specialty) Carlena Bjornstad, MD as Consulting Physician (Cardiology) Seeplaputhur Robinette Haines, MD (General Surgery)  CHIEF COMPLAINT:  Chief Complaint  Patient presents with  . FOLLOW-UP Cellulitis in left upper thigh  . Tingling in fingers   Oncology History   Chief Complaint/Problem List  1.  Autoimmune hemolytic anemia. Previously received Prednisone taper.   2.  Splenectomy in 5/02.  3.  Gastric ulcer diagnosed on endoscopy and multiple biopsies are pending, November 2005. 4.  Chronic lymphocytic leukemia. 5.vitamin B 12 deficiency 6.  Recent admission in the hospital in June of 2016 with hemolytic anemia and hemoglobin of 5.5 patient received blood transfusion was started on steroid 7.  Patient was started on rituximab (October 20, 2014        Oncology Flowsheet 11/22/2014 11/23/2014 11/24/2014 11/25/2014 11/26/2014 11/27/2014 11/28/2014  cyanocobalamin ((VITAMIN B-12)) IM - - - - - - -  enoxaparin (LOVENOX) [place] - 40 mg 40 mg 40 mg - 40 mg 40 mg  methylPREDNISolone sodium succinate 40 mg/mL (SOLU-MEDROL) IV - - - - - - -  predniSONE (DELTASONE) PO 10 mg 10 mg 10 mg 10 mg 10 mg 10 mg 10 mg  riTUXimab (RITUXAN) IV - - - - - - -  zolendronic acid (ZOMETA) IV - - - - - - -    INTERVAL HISTORY:   81 year old gentleman with a history of autoimmune hemolytic anemia and chronic lymphocytic leukemia Patient was recently discharged from the hospital because of cellulitis of the right thigh which is gradually improving.  On oral antibiotics with doxycycline and Augmentin. Patient also has been started on prednisone being tapered off hemoglobin has improved to 13 g. December 11, 2014 Patient came  here for further follow-up regarding autoimmune hemolytic anemia.  Cellulitis.  Appetite has been poor.  Left lower extremity swelling is stabile eyes redness persists that might be some increasing redness and swelling Patient is being closely followed by surgical service No chills.  No fever.. December 18, 2014 Patient is here for his weekly follow-up regarding cellulitis, autoimmune hemolytic anemia.  Cellulitis continues to improve still some recent redness and pain in the left thigh area.  Patient has finished Augmentin and doxycycline.   Patient is on 10 mg of prednisone every day for autoimmune hemolytic anemia No chills.  No fever.  Ambulation is improving. REVIEW OF SYSTEMS:    general status: Patient is feeling weak and tired.  No change in a performance status.  No chills.  No fever. HEENT:.  No evidence of stomatitis Lungs: No cough or shortness of breath Cardiac: No chest pain or paroxysmal nocturnal dyspnea GI: No nausea no vomiting no diarrhea no abdominal pain Skin: No rash Lower extremity redness and tenderness Improving Area of induration and redness is now localized underside of the left thigh . gradually improving Neurological system: No tingling.  No numbness.  No other focal signs Musculoskeletal system no bony pains  As per HPI. Otherwise, a complete review of systems is negatve.  PAST MEDICAL HISTORY: Past Medical History  Diagnosis Date  . CLL (chronic lymphoblastic leukemia) 2006    Rituxan treatment 2006  . Atrial septal aneurysm 2009    Insignificant, no, January, 2009  . Infection  of PEG site     cellulitis.Marland KitchenResolved  . Vertebral compression fracture     multiple levels  . Herpes zoster     right chest  . Aortic insufficiency 2009    mild, Mild, echo, 2012  . Diastolic dysfunction     mild  . Chest pain     EF 55%, echo, October, 2009, possible inferior and posterior borderline hypokinesis, patient has never  had cardiac catheterization  . Edema       EF 55%, echo, October, 2009, possible borderline inferior and posterior hypokinesis... heart catheterization has never been done(July 10, 2009)  . Myositis     Caused pulmonary insufficiency with muscle weakness in the past / Dr.Willis-neurology, Imuran and prednisone  . Calculus of kidney   . Inguinal hernia without mention of obstruction or gangrene, unilateral or unspecified, (not specified as recurrent)     right   . Unspecified gastritis and gastroduodenitis without mention of hemorrhage   . Abdominal pain, epigastric   . Nonspecific elevation of levels of transaminase or lactic acid dehydrogenase (LDH)   . Feeding difficulties and mismanagement   . Edema of male genital organs     Resolved, occurred during illness related to myositis  . Swelling of arm     left.Marland KitchenMarland KitchenResolved.... no DVT  . LFT elevation     Related to Imuran in the past... dose was adjusted  . Dyslipidemia     statins not used due to LFT abnormalities  . Shortness of breath     Breathing abnormalities related to muscle weakness from myositis  . RBBB (right bundle branch block)     Old  . Bradycardia     Sinus bradycardia, old  . Ejection fraction     EF 60%, echo, October, 2012  . Mitral regurgitation     Mild / moderate, echo, 2012  . Right ventricular dysfunction     Mild, echo, 2012  . Febrile illness     May, 2013, question sinusitis  . Polymyositis 11/26/2012  . Hemolytic anemia   . Cellulitis     PAST SURGICAL HISTORY: Past Surgical History  Procedure Laterality Date  . Splenectomy    . Hernia repair      left side  . Peg placement  06/2007  . Peg tube removal  12/2007  . Kyphosis surgery      multilevel vertebral  . Odontoid fracture surgery      anterior screw fixation  . Nasal sinus surgery      x2  . Cataract extraction      bi lateral    FAMILY HISTORY Family History  Problem Relation Age of Onset  . Breast cancer Sister   . Colon cancer Brother   . Prostate cancer Brother    . Stroke Mother   . Hypertension Mother   . Emphysema Father   . Diabetes Grandchild   . Heart attack Neg Hx   . Cancer Brother   . Cancer Sister   . Cancer Daughter     ADVANCED DIRECTIVES:  Patient does have advance healthcare directive, Patient   does not desire to make any changes  HEALTH MAINTENANCE: Social History  Substance Use Topics  . Smoking status: Former Smoker -- 0.50 packs/day for 31 years    Types: Cigarettes    Quit date: 06/29/1968  . Smokeless tobacco: Former Systems developer    Types: Chew  . Alcohol Use: No     Comment: rarely      Allergies  Allergen Reactions  . Sulfonamide Derivatives     Current Outpatient Prescriptions  Medication Sig Dispense Refill  . calcium-vitamin D (CALCIUM 500+D) 500-200 MG-UNIT per tablet Take 1 tablet by mouth 2 (two) times daily.      . Cholecalciferol (VITAMIN D3) 1000 UNITS tablet Take 1,000 Units by mouth daily.      . citalopram (CELEXA) 40 MG tablet Take 1 tablet (40 mg total) by mouth daily. 90 tablet 1  . cyanocobalamin (,VITAMIN B-12,) 1000 MCG/ML injection Inject 1,000 mcg into the muscle every 14 (fourteen) days. At the first of the month    . doxycycline (VIBRA-TABS) 100 MG tablet Take 1 tablet (100 mg total) by mouth 2 (two) times daily. 14 tablet 0  . ferrous sulfate 325 (65 FE) MG tablet Take 325 mg by mouth daily with breakfast.      . fish oil-omega-3 fatty acids 1000 MG capsule Take 1 g by mouth daily at 12 noon.     . folic acid (FOLVITE) 1 MG tablet Take 4 tablets (4 mg total) by mouth daily. 120 tablet 0  . furosemide (LASIX) 80 MG tablet Take 80 mg by mouth.    Marland Kitchen HYDROcodone-acetaminophen (NORCO/VICODIN) 5-325 MG per tablet Take 1 tablet by mouth every 6 (six) hours as needed. 120 tablet 0  . montelukast (SINGULAIR) 10 MG tablet Take 10 mg by mouth at bedtime.   11  . Multiple Vitamin (MULTIVITAMIN) capsule Take 1 capsule by mouth daily.      . mycophenolate (CELLCEPT) 500 MG tablet Take 1 tablet (500 mg  total) by mouth daily. 90 tablet 3  . pantoprazole (PROTONIX) 40 MG tablet Take 1 tablet (40 mg total) by mouth daily at 12 noon. 30 tablet 0  . potassium chloride SA (K-DUR,KLOR-CON) 20 MEQ tablet Take 20 mEq by mouth daily. Mon and Fri he takes BID  2  . predniSONE (DELTASONE) 20 MG tablet Take 0.5 tablets (10 mg total) by mouth daily with supper. (Patient taking differently: Take 10 mg by mouth daily with supper. Dr Oliva Bustard) 90 tablet 0  . PROAIR HFA 108 (90 BASE) MCG/ACT inhaler Inhale 2 puffs into the lungs 4 (four) times daily.   11  . Resource Beneprotein PACK Take 3g three times a day    . temazepam (RESTORIL) 30 MG capsule Take 1 capsule (30 mg total) by mouth at bedtime. 30 capsule 5  . amoxicillin-clavulanate (AUGMENTIN) 875-125 MG per tablet Take 1 tablet by mouth 2 (two) times daily. Dr Oliva Bustard (Patient not taking: Reported on 12/25/2014) 14 tablet 0   No current facility-administered medications for this visit.    OBJECTIVE:  Filed Vitals:   12/25/14 1017  BP: 136/71  Pulse: 55  Temp: 96.3 F (35.7 C)  Resp: 18     Body mass index is 21.65 kg/(m^2).    ECOG FS:1 - Symptomatic but completely ambulatory  PHYSICAL EXAM: Gen. status: Patient is alert oriented not any acute distress Lymphatic system: Supraclavicular, cervical, axillary, inguinal lymph nodes are not palpable Head exam was generally normal. There was no scleral icterus or corneal arcus. Mucous membranes were moist. Cardiac: Tachycardia Abdominal exam revealed normal bowel sounds. The abdomen was soft, non-tender, and without masses, organomegaly, or appreciable enlargement of the abdominal aorta. Examination of the skin revealed no evidence of significant rashes, suspicious appearing nevi or other concerning lesions. Neurologically, the patient was awake, alert, and oriented to person, place and time. There were no obvious focal neurologic abnormalities. Redness and tenderness in  the left thigh has improved.    Still in duration persists.  Will await opinion from surgical service Patient is due for another maintenance Rituxan therapy We may consider that and of the MONTH IHEMOGLOBIN  is stable Redness on every other day would be continued and Peaked to be reduced to twice a week Kyphoscoliosis: Osteoporosis and multiple vertebral compression fracture  Skin: Grade 1 rash LAB RESULTS:  No visits with results within 2 Day(s) from this visit. Latest known visit with results is:  Appointment on 12/18/2014  Component Date Value Ref Range Status  . WBC 12/18/2014 8.0  3.8 - 10.6 K/uL Final  . RBC 12/18/2014 3.88* 4.40 - 5.90 MIL/uL Final  . Hemoglobin 12/18/2014 13.6  13.0 - 18.0 g/dL Final  . HCT 12/18/2014 39.9* 40.0 - 52.0 % Final  . MCV 12/18/2014 102.9* 80.0 - 100.0 fL Final  . MCH 12/18/2014 35.1* 26.0 - 34.0 pg Final  . MCHC 12/18/2014 34.1  32.0 - 36.0 g/dL Final  . RDW 12/18/2014 14.7* 11.5 - 14.5 % Final  . Platelets 12/18/2014 213  150 - 440 K/uL Final  . Neutrophils Relative % 12/18/2014 72   Final  . Neutro Abs 12/18/2014 5.7  1.4 - 6.5 K/uL Final  . Lymphocytes Relative 12/18/2014 21   Final  . Lymphs Abs 12/18/2014 1.7  1.0 - 3.6 K/uL Final  . Monocytes Relative 12/18/2014 6   Final  . Monocytes Absolute 12/18/2014 0.5  0.2 - 1.0 K/uL Final  . Eosinophils Relative 12/18/2014 0   Final  . Eosinophils Absolute 12/18/2014 0.0  0 - 0.7 K/uL Final  . Basophils Relative 12/18/2014 1   Final  . Basophils Absolute 12/18/2014 0.1  0 - 0.1 K/uL Final  . Sodium 12/18/2014 137  135 - 145 mmol/L Final  . Potassium 12/18/2014 4.5  3.5 - 5.1 mmol/L Final  . Chloride 12/18/2014 102  101 - 111 mmol/L Final  . CO2 12/18/2014 27  22 - 32 mmol/L Final  . Glucose, Bld 12/18/2014 139* 65 - 99 mg/dL Final  . BUN 12/18/2014 18  6 - 20 mg/dL Final  . Creatinine, Ser 12/18/2014 0.76  0.61 - 1.24 mg/dL Final  . Calcium 12/18/2014 9.1  8.9 - 10.3 mg/dL Final  . Total Protein 12/18/2014 6.4* 6.5 -  8.1 g/dL Final  . Albumin 12/18/2014 3.9  3.5 - 5.0 g/dL Final  . AST 12/18/2014 29  15 - 41 U/L Final  . ALT 12/18/2014 15* 17 - 63 U/L Final  . Alkaline Phosphatase 12/18/2014 49  38 - 126 U/L Final  . Total Bilirubin 12/18/2014 1.7* 0.3 - 1.2 mg/dL Final  . GFR calc non Af Amer 12/18/2014 >60  >60 mL/min Final  . GFR calc Af Amer 12/18/2014 >60  >60 mL/min Final   Comment: (NOTE) The eGFR has been calculated using the CKD EPI equation. This calculation has not been validated in all clinical situations. eGFR's persistently <60 mL/min signify possible Chronic Kidney Disease.   . Anion gap 12/18/2014 8  5 - 15 Final      STUDIES: No results found.  ASSESSMENT: Autoimmune hemolytic anemia with lymphoproliferative disease.Marland Kitchen   On prednisone 10 mg by mouth daily hemoglobin is 13 g Prednisone is now being tapered off every other day and hemoglobin will be monitored 2.  Cellulitis of the thigh Improving in duration still persist redness has improved patient will be continued on doxycycline and reevaluation with surgical intervention planned on September 19 if needed  MEDICAL DECISION MAKING:  Cellulitis of lower extremity All lab data has been reviewed Gradually improving. Continue antibiotic for 7 more days Taper prednisone every other day    No matching staging information was found for the patient.  Forest Gleason, MD   12/25/2014 11:05 AM

## 2014-12-29 ENCOUNTER — Encounter: Payer: Self-pay | Admitting: General Surgery

## 2014-12-29 ENCOUNTER — Other Ambulatory Visit: Payer: Medicare Other

## 2014-12-29 ENCOUNTER — Ambulatory Visit (INDEPENDENT_AMBULATORY_CARE_PROVIDER_SITE_OTHER): Payer: Medicare Other | Admitting: General Surgery

## 2014-12-29 VITALS — BP 130/66 | HR 68 | Resp 14 | Ht 66.0 in | Wt 130.0 lb

## 2014-12-29 DIAGNOSIS — L03116 Cellulitis of left lower limb: Secondary | ICD-10-CM

## 2014-12-29 NOTE — Progress Notes (Signed)
This is a 81 year old male here today for his three week office visit and post op hospital stay for cellulitis left thigh. Patient states he is doing better.  Exam shows complete resolution of redness. There is a 3-4 cm fullness in the upper inner thigh. No tenderness. [place] of this area revealed a flat benign appearing lymph node. No suggestion of loculated fluid.  Patient to return as needed.

## 2015-01-01 ENCOUNTER — Inpatient Hospital Stay: Payer: Medicare Other

## 2015-01-01 ENCOUNTER — Inpatient Hospital Stay (HOSPITAL_BASED_OUTPATIENT_CLINIC_OR_DEPARTMENT_OTHER): Payer: Medicare Other | Admitting: Oncology

## 2015-01-01 ENCOUNTER — Encounter: Payer: Self-pay | Admitting: Oncology

## 2015-01-01 VITALS — BP 101/67 | HR 59 | Temp 95.9°F | Wt 129.5 lb

## 2015-01-01 DIAGNOSIS — E538 Deficiency of other specified B group vitamins: Secondary | ICD-10-CM

## 2015-01-01 DIAGNOSIS — R202 Paresthesia of skin: Secondary | ICD-10-CM

## 2015-01-01 DIAGNOSIS — Z809 Family history of malignant neoplasm, unspecified: Secondary | ICD-10-CM

## 2015-01-01 DIAGNOSIS — I708 Atherosclerosis of other arteries: Secondary | ICD-10-CM

## 2015-01-01 DIAGNOSIS — Z87311 Personal history of (healed) other pathological fracture: Secondary | ICD-10-CM

## 2015-01-01 DIAGNOSIS — Z800 Family history of malignant neoplasm of digestive organs: Secondary | ICD-10-CM

## 2015-01-01 DIAGNOSIS — I519 Heart disease, unspecified: Secondary | ICD-10-CM

## 2015-01-01 DIAGNOSIS — N400 Enlarged prostate without lower urinary tract symptoms: Secondary | ICD-10-CM

## 2015-01-01 DIAGNOSIS — C9110 Chronic lymphocytic leukemia of B-cell type not having achieved remission: Secondary | ICD-10-CM | POA: Diagnosis not present

## 2015-01-01 DIAGNOSIS — N433 Hydrocele, unspecified: Secondary | ICD-10-CM

## 2015-01-01 DIAGNOSIS — I4510 Unspecified right bundle-branch block: Secondary | ICD-10-CM

## 2015-01-01 DIAGNOSIS — D594 Other nonautoimmune hemolytic anemias: Secondary | ICD-10-CM

## 2015-01-01 DIAGNOSIS — L03116 Cellulitis of left lower limb: Secondary | ICD-10-CM | POA: Diagnosis not present

## 2015-01-01 DIAGNOSIS — F17210 Nicotine dependence, cigarettes, uncomplicated: Secondary | ICD-10-CM

## 2015-01-01 DIAGNOSIS — Z79899 Other long term (current) drug therapy: Secondary | ICD-10-CM

## 2015-01-01 DIAGNOSIS — Z87442 Personal history of urinary calculi: Secondary | ICD-10-CM

## 2015-01-01 DIAGNOSIS — D479 Neoplasm of uncertain behavior of lymphoid, hematopoietic and related tissue, unspecified: Secondary | ICD-10-CM

## 2015-01-01 DIAGNOSIS — Z803 Family history of malignant neoplasm of breast: Secondary | ICD-10-CM

## 2015-01-01 DIAGNOSIS — D591 Other autoimmune hemolytic anemias: Secondary | ICD-10-CM

## 2015-01-01 DIAGNOSIS — I340 Nonrheumatic mitral (valve) insufficiency: Secondary | ICD-10-CM

## 2015-01-01 DIAGNOSIS — Z8711 Personal history of peptic ulcer disease: Secondary | ICD-10-CM

## 2015-01-01 DIAGNOSIS — R609 Edema, unspecified: Secondary | ICD-10-CM

## 2015-01-01 DIAGNOSIS — M3320 Polymyositis, organ involvement unspecified: Secondary | ICD-10-CM

## 2015-01-01 DIAGNOSIS — Z9081 Acquired absence of spleen: Secondary | ICD-10-CM

## 2015-01-01 LAB — COMPREHENSIVE METABOLIC PANEL
ALT: 18 U/L (ref 17–63)
AST: 29 U/L (ref 15–41)
Albumin: 3.9 g/dL (ref 3.5–5.0)
Alkaline Phosphatase: 59 U/L (ref 38–126)
Anion gap: 5 (ref 5–15)
BILIRUBIN TOTAL: 1 mg/dL (ref 0.3–1.2)
BUN: 20 mg/dL (ref 6–20)
CO2: 29 mmol/L (ref 22–32)
CREATININE: 0.68 mg/dL (ref 0.61–1.24)
Calcium: 8.5 mg/dL — ABNORMAL LOW (ref 8.9–10.3)
Chloride: 99 mmol/L — ABNORMAL LOW (ref 101–111)
GFR calc Af Amer: >60 mL/min (ref >60)
Glucose, Bld: 88 mg/dL (ref 65–99)
Potassium: 4.1 mmol/L (ref 3.5–5.1)
Sodium: 133 mmol/L — ABNORMAL LOW (ref 135–145)
TOTAL PROTEIN: 6.4 g/dL — AB (ref 6.5–8.1)

## 2015-01-01 LAB — CBC WITH DIFFERENTIAL/PLATELET
BASOS ABS: 0.1 10*3/uL (ref 0–0.1)
Basophils Relative: 1 %
Eosinophils Absolute: 0.9 10*3/uL — ABNORMAL HIGH (ref 0–0.7)
Eosinophils Relative: 8 %
HEMATOCRIT: 37.8 % — AB (ref 40.0–52.0)
HEMOGLOBIN: 12.8 g/dL — AB (ref 13.0–18.0)
LYMPHS PCT: 45 %
Lymphs Abs: 4.6 10*3/uL — ABNORMAL HIGH (ref 1.0–3.6)
MCH: 33.9 pg (ref 26.0–34.0)
MCHC: 33.9 g/dL (ref 32.0–36.0)
MCV: 99.9 fL (ref 80.0–100.0)
Monocytes Absolute: 1.8 10*3/uL — ABNORMAL HIGH (ref 0.2–1.0)
Monocytes Relative: 17 %
NEUTROS ABS: 3.1 10*3/uL (ref 1.4–6.5)
Neutrophils Relative %: 29 %
Platelets: 333 10*3/uL (ref 150–440)
RBC: 3.78 MIL/uL — AB (ref 4.40–5.90)
RDW: 14 % (ref 11.5–14.5)
WBC: 10.4 10*3/uL (ref 3.8–10.6)

## 2015-01-01 LAB — MAGNESIUM: MAGNESIUM: 1.9 mg/dL (ref 1.7–2.4)

## 2015-01-01 MED ORDER — SODIUM CHLORIDE 0.9 % IV SOLN
Freq: Once | INTRAVENOUS | Status: AC
  Administered 2015-01-01: 12:00:00 via INTRAVENOUS
  Filled 2015-01-01: qty 1000

## 2015-01-01 MED ORDER — ACETAMINOPHEN 325 MG PO TABS
650.0000 mg | ORAL_TABLET | Freq: Once | ORAL | Status: AC
  Administered 2015-01-01: 650 mg via ORAL
  Filled 2015-01-01: qty 2

## 2015-01-01 MED ORDER — PREDNISONE 20 MG PO TABS: ORAL_TABLET | ORAL | Status: DC

## 2015-01-01 MED ORDER — RITUXIMAB CHEMO INJECTION 500 MG/50ML: 375.0000 mg/m2 | Freq: Once | INTRAVENOUS | Status: DC

## 2015-01-01 MED ORDER — SODIUM CHLORIDE 0.9 % IV SOLN
375.0000 mg/m2 | Freq: Once | INTRAVENOUS | Status: AC
  Administered 2015-01-01: 600 mg via INTRAVENOUS
  Filled 2015-01-01: qty 50

## 2015-01-01 MED ORDER — DIPHENHYDRAMINE HCL 50 MG/ML IJ SOLN
6.2500 mg | Freq: Once | INTRAMUSCULAR | Status: AC
  Administered 2015-01-01: 6.5 mg via INTRAVENOUS
  Filled 2015-01-01: qty 1

## 2015-01-01 NOTE — Progress Notes (Signed)
Patient does have living will.  Former smoker. Patient states he has a slight cough.

## 2015-01-01 NOTE — Progress Notes (Signed)
[facility] [phone]  [phone]     Ab Leaming OB: 03-16-1930  MR#: [id number]  IWP#:[id number]  Patient Care Team: Juline Patch, MD as PCP - General (Family Medicine) Kathrynn Ducking, MD (Neurology) Forest Gleason, MD as Consulting Physician (Unknown Physician Specialty) Carlena Bjornstad, MD as Consulting Physician (Cardiology) Seeplaputhur Robinette Haines, MD (General Surgery)  CHIEF COMPLAINT:  Chief Complaint  Patient presents with  . OTHER   Oncology History   Chief Complaint/Problem List  1.  Autoimmune hemolytic anemia. Previously received Prednisone taper.   2.  Splenectomy in 5/02.  3.  Gastric ulcer diagnosed on endoscopy and multiple biopsies are pending, November 2005. 4.  Chronic lymphocytic leukemia. 5.vitamin B 12 deficiency 6.  Recent admission in the hospital in June of 2016 with hemolytic anemia and hemoglobin of 5.5 patient received blood transfusion was started on steroid 7.  Patient was started on rituximab (October 20, 2014        Oncology Flowsheet 11/22/2014 11/23/2014 11/24/2014 11/25/2014 11/26/2014 11/27/2014 11/28/2014  cyanocobalamin ((VITAMIN B-12)) IM - - - - - - -  enoxaparin (LOVENOX) [place] - 40 mg 40 mg 40 mg - 40 mg 40 mg  methylPREDNISolone sodium succinate 40 mg/mL (SOLU-MEDROL) IV - - - - - - -  predniSONE (DELTASONE) PO 10 mg 10 mg 10 mg 10 mg 10 mg 10 mg 10 mg  riTUXimab (RITUXAN) IV - - - - - - -  zolendronic acid (ZOMETA) IV - - - - - - -    INTERVAL HISTORY:   81 year old gentleman came today for further follow-up regarding cellulitis left thigh and groin area.  Redness is improving no chills fever.  Last dose of doxycycline was today. Also has autoimmune hemolytic anemia.  Patient is on prednisone 10 mg 4 days a week Patient is also for Rituxan therapy today with for management of chronic lymphocytic leukemia associated with autoimmune hemolytic anemia  REVIEW OF SYSTEMS:   He is feeling stronger.  Redness in  the left thigh area has improved.  Some induration persists.  No chills.  No fever.  Hemoglobin is improving.  Continues to have chronic back pain.  No swelling of lower extremity.  All other 12 systems have been reviewed and no other abnormality detected  As per HPI. Otherwise, a complete review of systems is negatve.  PAST MEDICAL HISTORY: Past Medical History  Diagnosis Date  . CLL (chronic lymphoblastic leukemia) 2006    Rituxan treatment 2006  . Atrial septal aneurysm 2009    Insignificant, no, January, 2009  . Infection of PEG site     cellulitis.Marland KitchenResolved  . Vertebral compression fracture     multiple levels  . Herpes zoster     right chest  . Aortic insufficiency 2009    mild, Mild, echo, 2012  . Diastolic dysfunction     mild  . Chest pain     EF 55%, echo, October, 2009, possible inferior and posterior borderline hypokinesis, patient has never  had cardiac catheterization  . Edema     EF 55%, echo, October, 2009, possible borderline inferior and posterior hypokinesis... heart catheterization has never been done(July 10, 2009)  . Myositis     Caused pulmonary insufficiency with muscle weakness in the past / Dr.Willis-neurology, Imuran and prednisone  . Calculus of kidney   . Inguinal hernia without mention of obstruction or gangrene, unilateral or unspecified, (not specified as recurrent)     right   . Unspecified gastritis  and gastroduodenitis without mention of hemorrhage   . Abdominal pain, epigastric   . Nonspecific elevation of levels of transaminase or lactic acid dehydrogenase (LDH)   . Feeding difficulties and mismanagement   . Edema of male genital organs     Resolved, occurred during illness related to myositis  . Swelling of arm     left.Marland KitchenMarland KitchenResolved.... no DVT  . LFT elevation     Related to Imuran in the past... dose was adjusted  . Dyslipidemia     statins not used due to LFT abnormalities  . Shortness of breath     Breathing abnormalities related to  muscle weakness from myositis  . RBBB (right bundle branch block)     Old  . Bradycardia     Sinus bradycardia, old  . Ejection fraction     EF 60%, echo, October, 2012  . Mitral regurgitation     Mild / moderate, echo, 2012  . Right ventricular dysfunction     Mild, echo, 2012  . Febrile illness     May, 2013, question sinusitis  . Polymyositis 11/26/2012  . Hemolytic anemia   . Cellulitis     PAST SURGICAL HISTORY: Past Surgical History  Procedure Laterality Date  . Splenectomy    . Hernia repair      left side  . Peg placement  06/2007  . Peg tube removal  12/2007  . Kyphosis surgery      multilevel vertebral  . Odontoid fracture surgery      anterior screw fixation  . Nasal sinus surgery      x2  . Cataract extraction      bi lateral    FAMILY HISTORY Family History  Problem Relation Age of Onset  . Breast cancer Sister   . Colon cancer Brother   . Prostate cancer Brother   . Stroke Mother   . Hypertension Mother   . Emphysema Father   . Diabetes Grandchild   . Heart attack Neg Hx   . Cancer Brother   . Cancer Sister   . Cancer Daughter     ADVANCED DIRECTIVES:  Patient does have advance healthcare directive, Patient   does not desire to make any changes  HEALTH MAINTENANCE: Social History  Substance Use Topics  . Smoking status: Former Smoker -- 0.50 packs/day for 31 years    Types: Cigarettes    Quit date: 06/29/1968  . Smokeless tobacco: Former Systems developer    Types: Chew  . Alcohol Use: No     Comment: rarely      Allergies  Allergen Reactions  . Sulfonamide Derivatives     Current Outpatient Prescriptions  Medication Sig Dispense Refill  . amoxicillin-clavulanate (AUGMENTIN) 875-125 MG per tablet Take 1 tablet by mouth 2 (two) times daily. Dr Oliva Bustard 14 tablet 0  . calcium-vitamin D (CALCIUM 500+D) 500-200 MG-UNIT per tablet Take 1 tablet by mouth 2 (two) times daily.      . Cholecalciferol (VITAMIN D3) 1000 UNITS tablet Take 1,000 Units by  mouth daily.      . citalopram (CELEXA) 40 MG tablet Take 1 tablet (40 mg total) by mouth daily. 90 tablet 1  . cyanocobalamin (,VITAMIN B-12,) 1000 MCG/ML injection Inject 1,000 mcg into the muscle every 14 (fourteen) days. At the first of the month    . doxycycline (VIBRA-TABS) 100 MG tablet Take 1 tablet (100 mg total) by mouth 2 (two) times daily. 14 tablet 0  . ferrous sulfate 325 (65 FE) MG tablet  Take 325 mg by mouth daily with breakfast.      . fish oil-omega-3 fatty acids 1000 MG capsule Take 1 g by mouth daily at 12 noon.     . folic acid (FOLVITE) 1 MG tablet Take 4 tablets (4 mg total) by mouth daily. 120 tablet 0  . furosemide (LASIX) 80 MG tablet Take 80 mg by mouth.    Marland Kitchen HYDROcodone-acetaminophen (NORCO/VICODIN) 5-325 MG per tablet Take 1 tablet by mouth every 6 (six) hours as needed. 120 tablet 0  . montelukast (SINGULAIR) 10 MG tablet Take 10 mg by mouth at bedtime.   11  . Multiple Vitamin (MULTIVITAMIN) capsule Take 1 capsule by mouth daily.      . mycophenolate (CELLCEPT) 500 MG tablet Take 1 tablet (500 mg total) by mouth daily. 90 tablet 3  . pantoprazole (PROTONIX) 40 MG tablet Take 1 tablet (40 mg total) by mouth daily at 12 noon. 90 tablet 1  . potassium chloride SA (K-DUR,KLOR-CON) 20 MEQ tablet Take 20 mEq by mouth daily. Mon and Fri he takes BID  2  . predniSONE (DELTASONE) 20 MG tablet Take 0.5 tablet by mouth 4 times a week 90 tablet 0  . PROAIR HFA 108 (90 BASE) MCG/ACT inhaler Inhale 2 puffs into the lungs 4 (four) times daily.   11  . Resource Beneprotein PACK Take 3g three times a day    . temazepam (RESTORIL) 30 MG capsule Take 1 capsule (30 mg total) by mouth at bedtime. 30 capsule 5   No current facility-administered medications for this visit.    OBJECTIVE:  Filed Vitals:   01/01/15 1044  BP: 101/67  Pulse: 59  Temp: 95.9 F (35.5 C)     Body mass index is 20.91 kg/(m^2).    ECOG FS:1 - Symptomatic but completely ambulatory  PHYSICAL EXAM: Gen.  status: Patient is alert oriented not any acute distress Lymphatic system: Supraclavicular, cervical, axillary, inguinal lymph nodes are not palpable Head exam was generally normal. There was no scleral icterus or corneal arcus. Mucous membranes were moist. Cardiac: Tachycardia Abdominal exam revealed normal bowel sounds. The abdomen was soft, non-tender, and without masses, organomegaly, or appreciable enlargement of the abdominal aorta. Examination of the skin revealed no evidence of significant rashes, suspicious appearing nevi or other concerning lesions. Neurologically, the patient was awake, alert, and oriented to person, place and time. There were no obvious focal neurologic abnormalities. Redness and tenderness in the left thigh has improved.  Mild induration persist surgical evaluation has been reviewed.  No further surgical intervention has been planned  Patient is due for another maintenance Rituxan therapy We may consider that and of the MONTH IHEMOGLOBIN  is stable Redness on every other day would be continued and Peaked to be reduced to twice a week Kyphoscoliosis: Osteoporosis and multiple vertebral compression fracture  Skin: Grade 1 rash LAB RESULTS:  Appointment on 01/01/2015  Component Date Value Ref Range Status  . WBC 01/01/2015 10.4  3.8 - 10.6 K/uL Final  . RBC 01/01/2015 3.78* 4.40 - 5.90 MIL/uL Final  . Hemoglobin 01/01/2015 12.8* 13.0 - 18.0 g/dL Final  . HCT 01/01/2015 37.8* 40.0 - 52.0 % Final  . MCV 01/01/2015 99.9  80.0 - 100.0 fL Final  . MCH 01/01/2015 33.9  26.0 - 34.0 pg Final  . MCHC 01/01/2015 33.9  32.0 - 36.0 g/dL Final  . RDW 01/01/2015 14.0  11.5 - 14.5 % Final  . Platelets 01/01/2015 333  150 - 440 K/uL Final  .  Neutrophils Relative % 01/01/2015 29   Final  . Neutro Abs 01/01/2015 3.1  1.4 - 6.5 K/uL Final  . Lymphocytes Relative 01/01/2015 45   Final  . Lymphs Abs 01/01/2015 4.6* 1.0 - 3.6 K/uL Final  . Monocytes Relative 01/01/2015 17    Final  . Monocytes Absolute 01/01/2015 1.8* 0.2 - 1.0 K/uL Final  . Eosinophils Relative 01/01/2015 8   Final  . Eosinophils Absolute 01/01/2015 0.9* 0 - 0.7 K/uL Final  . Basophils Relative 01/01/2015 1   Final  . Basophils Absolute 01/01/2015 0.1  0 - 0.1 K/uL Final      STUDIES: No results found.  ASSESSMENT:/MEDICAL DECISION MAKING: Autoimmune hemolytic anemia with lymphoproliferative disease.Marland Kitchen Hemoglobin is 12.8.  Prednisone dose should be reduced to 2 times a week for 2 weeks and then discontinue    2.  Cellulitis of the thigh Improving.  Induration persist.  We will stop all antibiotics at present time and continue to monitor redness patient was instructed to call me if spikes high fever.  Or any more progressive redness or pain. 3.  CLL with autoimmune hemolytic anemia  We will initiate maintenance Rituxan therapy carefully managed during hemoglobin and white count.  And gradually taking patient off prednisone therapy       No matching staging information was found for the patient.  Forest Gleason, MD   01/01/2015 11:11 AM

## 2015-01-02 ENCOUNTER — Encounter: Payer: Self-pay | Admitting: Oncology

## 2015-01-06 ENCOUNTER — Ambulatory Visit (INDEPENDENT_AMBULATORY_CARE_PROVIDER_SITE_OTHER): Payer: Medicare Other | Admitting: Family Medicine

## 2015-01-06 ENCOUNTER — Encounter: Payer: Self-pay | Admitting: Family Medicine

## 2015-01-06 VITALS — BP 120/62 | HR 76 | Ht 65.0 in | Wt 131.0 lb

## 2015-01-06 DIAGNOSIS — C9110 Chronic lymphocytic leukemia of B-cell type not having achieved remission: Secondary | ICD-10-CM | POA: Diagnosis not present

## 2015-01-06 DIAGNOSIS — I509 Heart failure, unspecified: Secondary | ICD-10-CM

## 2015-01-06 DIAGNOSIS — Z23 Encounter for immunization: Secondary | ICD-10-CM | POA: Diagnosis not present

## 2015-01-06 DIAGNOSIS — J209 Acute bronchitis, unspecified: Secondary | ICD-10-CM

## 2015-01-06 DIAGNOSIS — L0390 Cellulitis, unspecified: Secondary | ICD-10-CM | POA: Diagnosis not present

## 2015-01-06 DIAGNOSIS — M4850XG Collapsed vertebra, not elsewhere classified, site unspecified, subsequent encounter for fracture with delayed healing: Secondary | ICD-10-CM

## 2015-01-06 DIAGNOSIS — J441 Chronic obstructive pulmonary disease with (acute) exacerbation: Secondary | ICD-10-CM | POA: Diagnosis not present

## 2015-01-06 DIAGNOSIS — I50810 Right heart failure, unspecified: Secondary | ICD-10-CM | POA: Insufficient documentation

## 2015-01-06 DIAGNOSIS — IMO0001 Reserved for inherently not codable concepts without codable children: Secondary | ICD-10-CM

## 2015-01-06 MED ORDER — GUAIFENESIN-CODEINE 100-10 MG/5ML PO SOLN: 5.0000 mL | Freq: Three times a day (TID) | ORAL | Status: DC | PRN

## 2015-01-06 MED ORDER — ALBUTEROL SULFATE (2.5 MG/3ML) 0.083% IN NEBU: 2.5000 mg | INHALATION_SOLUTION | Freq: Four times a day (QID) | RESPIRATORY_TRACT | Status: AC | PRN

## 2015-01-06 MED ORDER — LEVOFLOXACIN 500 MG PO TABS: 500.0000 mg | ORAL_TABLET | Freq: Every day | ORAL | Status: DC

## 2015-01-06 NOTE — Progress Notes (Signed)
Name: [name] [name]   MRN: [id number]    DOB: 08-01-1929   Date:01/06/2015       Progress Note  Subjective  Chief Complaint  Chief Complaint  Patient presents with  . Cough    yellow/thick production    Cough This is a new problem. The current episode started 1 to 4 weeks ago. The problem has been waxing and waning. The cough is productive of purulent sputum. Associated symptoms include a rash and shortness of breath. Pertinent negatives include no chest pain, chills, ear congestion, ear pain, fever, headaches, heartburn, hemoptysis, myalgias, nasal congestion, postnasal drip, rhinorrhea, sore throat, sweats, weight loss or wheezing. Associated symptoms comments: cellulitis. The symptoms are aggravated by pollens. He has tried a beta-agonist inhaler for the symptoms. The treatment provided mild relief. There is no history of asthma, bronchiectasis, bronchitis, COPD, emphysema, environmental allergies or pneumonia.    No problem-specific assessment & plan notes found for this encounter.   Past Medical History  Diagnosis Date  . CLL (chronic lymphoblastic leukemia) 2006    Rituxan treatment 2006  . Atrial septal aneurysm 2009    Insignificant, no, January, 2009  . Infection of PEG site     cellulitis.Marland KitchenResolved  . Vertebral compression fracture     multiple levels  . Herpes zoster     right chest  . Aortic insufficiency 2009    mild, Mild, echo, 2012  . Diastolic dysfunction     mild  . Chest pain     EF 55%, echo, October, 2009, possible inferior and posterior borderline hypokinesis, patient has never  had cardiac catheterization  . Edema     EF 55%, echo, October, 2009, possible borderline inferior and posterior hypokinesis... heart catheterization has never been done(July 10, 2009)  . Myositis     Caused pulmonary insufficiency with muscle weakness in the past / Dr.Willis-neurology, Imuran and prednisone  . Calculus of kidney   . Inguinal hernia without mention of obstruction  or gangrene, unilateral or unspecified, (not specified as recurrent)     right   . Unspecified gastritis and gastroduodenitis without mention of hemorrhage   . Abdominal pain, epigastric   . Nonspecific elevation of levels of transaminase or lactic acid dehydrogenase (LDH)   . Feeding difficulties and mismanagement   . Edema of male genital organs     Resolved, occurred during illness related to myositis  . Swelling of arm     left.Marland KitchenMarland KitchenResolved.... no DVT  . LFT elevation     Related to Imuran in the past... dose was adjusted  . Dyslipidemia     statins not used due to LFT abnormalities  . Shortness of breath     Breathing abnormalities related to muscle weakness from myositis  . RBBB (right bundle branch block)     Old  . Bradycardia     Sinus bradycardia, old  . Ejection fraction     EF 60%, echo, October, 2012  . Mitral regurgitation     Mild / moderate, echo, 2012  . Right ventricular dysfunction     Mild, echo, 2012  . Febrile illness     May, 2013, question sinusitis  . Polymyositis 11/26/2012  . Hemolytic anemia   . Cellulitis     Past Surgical History  Procedure Laterality Date  . Splenectomy    . Hernia repair      left side  . Peg placement  06/2007  . Peg tube removal  12/2007  . Kyphosis surgery  multilevel vertebral  . Odontoid fracture surgery      anterior screw fixation  . Nasal sinus surgery      x2  . Cataract extraction      bi lateral    Family History  Problem Relation Age of Onset  . Breast cancer Sister   . Colon cancer Brother   . Prostate cancer Brother   . Stroke Mother   . Hypertension Mother   . Emphysema Father   . Diabetes Grandchild   . Heart attack Neg Hx   . Cancer Brother   . Cancer Sister   . Cancer Daughter     Social History   Social History  . Marital Status: Widowed    Spouse Name: N/A  . Number of Children: 4  . Years of Education: college   Occupational History  . retired    Social History Main Topics   . Smoking status: Former Smoker -- 0.50 packs/day for 31 years    Types: Cigarettes    Quit date: 06/29/1968  . Smokeless tobacco: Former Systems developer    Types: Chew  . Alcohol Use: No     Comment: rarely  . Drug Use: No  . Sexual Activity: No   Other Topics Concern  . Not on file   Social History Narrative   Patient is widowed and lives alone.   Patient is retired.   Patient has four adult children.   Patient has a college degree.   Patient is right-handed.   Patient drinks one cup of coffee daily.    Allergies  Allergen Reactions  . Sulfonamide Derivatives      Review of Systems  Constitutional: Negative for fever, chills, weight loss and malaise/fatigue.  HENT: Negative for ear discharge, ear pain, postnasal drip, rhinorrhea and sore throat.   Eyes: Negative for blurred vision.  Respiratory: Positive for cough and shortness of breath. Negative for hemoptysis, sputum production and wheezing.   Cardiovascular: Negative for chest pain, palpitations and leg swelling.  Gastrointestinal: Negative for heartburn, nausea, abdominal pain, diarrhea, constipation, blood in stool and melena.  Genitourinary: Negative for dysuria, urgency, frequency and hematuria.  Musculoskeletal: Negative for myalgias, back pain, joint pain and neck pain.  Skin: Positive for rash.  Neurological: Negative for dizziness, tingling, sensory change, focal weakness and headaches.  Endo/Heme/Allergies: Negative for environmental allergies and polydipsia. Does not bruise/bleed easily.  Psychiatric/Behavioral: Negative for depression and suicidal ideas. The patient is not nervous/anxious and does not have insomnia.      Objective  Filed Vitals:   01/06/15 1338  BP: 120/62  Pulse: 76  Height: 5\' 5"  (1.651 m)  Weight: 131 lb (59.421 kg)    Physical Exam  Constitutional: He is oriented to person, place, and time and well-developed, well-nourished, and in no distress.  HENT:  Head: Normocephalic.  Right  Ear: External ear normal.  Left Ear: External ear normal.  Nose: Nose normal.  Mouth/Throat: Oropharynx is clear and moist.  Eyes: Conjunctivae and EOM are normal. Pupils are equal, round, and reactive to light. Right eye exhibits no discharge. Left eye exhibits no discharge. No scleral icterus.  Neck: Normal range of motion. Neck supple. No JVD present. No tracheal deviation present. No thyromegaly present.  Cardiovascular: Normal rate, regular rhythm, normal heart sounds and intact distal pulses.  Exam reveals no gallop and no friction rub.   No murmur heard. Pulmonary/Chest: Breath sounds normal. No respiratory distress. He has no wheezes. He has no rales.  Abdominal: Soft. Bowel  sounds are normal. He exhibits no mass. There is no hepatosplenomegaly. There is no tenderness. There is no rebound, no guarding and no CVA tenderness.  Musculoskeletal: Normal range of motion. He exhibits no edema or tenderness.  Lymphadenopathy:    He has no cervical adenopathy.  Neurological: He is alert and oriented to person, place, and time. He has normal sensation, normal strength, normal reflexes and intact cranial nerves. No cranial nerve deficit.  Skin: Skin is warm. Rash noted.  Psychiatric: Mood and affect normal.  Nursing note and vitals reviewed.     Assessment & Plan  Problem List Items Addressed This Visit      Cardiovascular and Mediastinum   Right ventricular failure     Respiratory   Acute bronchitis - Primary   Relevant Medications   albuterol (PROVENTIL) (2.5 MG/3ML) 0.083% nebulizer solution   levofloxacin (LEVAQUIN) 500 MG tablet   guaiFENesin-codeine 100-10 MG/5ML syrup   Chronic obstructive pulmonary disease with acute exacerbation   Relevant Medications   albuterol (PROVENTIL) (2.5 MG/3ML) 0.083% nebulizer solution   guaiFENesin-codeine 100-10 MG/5ML syrup     Musculoskeletal and Integument   Vertebral compression fracture     Other   CLL (chronic lymphocytic leukemia)    Chronic cellulitis        Dr. Deanna Jones [facility]  01/06/2015

## 2015-01-12 ENCOUNTER — Encounter: Payer: Self-pay | Admitting: Gastroenterology

## 2015-01-12 ENCOUNTER — Other Ambulatory Visit: Payer: Self-pay | Admitting: Family Medicine

## 2015-01-12 ENCOUNTER — Other Ambulatory Visit: Payer: Self-pay

## 2015-01-12 DIAGNOSIS — J209 Acute bronchitis, unspecified: Secondary | ICD-10-CM

## 2015-01-12 DIAGNOSIS — L259 Unspecified contact dermatitis, unspecified cause: Secondary | ICD-10-CM

## 2015-01-12 MED ORDER — LEVOFLOXACIN 500 MG PO TABS: 500.0000 mg | ORAL_TABLET | Freq: Every day | ORAL | Status: DC

## 2015-01-13 ENCOUNTER — Ambulatory Visit
Admission: RE | Admit: 2015-01-13 | Discharge: 2015-01-13 | Disposition: A | Discharge status: Home / Self Care | Payer: Medicare Other | Source: Ambulatory Visit | Attending: Oncology | Admitting: Oncology

## 2015-01-13 ENCOUNTER — Telehealth: Payer: Self-pay | Admitting: *Deleted

## 2015-01-13 ENCOUNTER — Inpatient Hospital Stay: Payer: Medicare Other | Attending: Oncology

## 2015-01-13 ENCOUNTER — Encounter: Payer: Self-pay | Admitting: Oncology

## 2015-01-13 ENCOUNTER — Inpatient Hospital Stay (HOSPITAL_BASED_OUTPATIENT_CLINIC_OR_DEPARTMENT_OTHER): Payer: Medicare Other | Admitting: Oncology

## 2015-01-13 VITALS — BP 124/63 | HR 83 | Temp 96.5°F | Wt 126.8 lb

## 2015-01-13 DIAGNOSIS — R69 Illness, unspecified: Secondary | ICD-10-CM

## 2015-01-13 DIAGNOSIS — E785 Hyperlipidemia, unspecified: Secondary | ICD-10-CM | POA: Diagnosis not present

## 2015-01-13 DIAGNOSIS — I4510 Unspecified right bundle-branch block: Secondary | ICD-10-CM

## 2015-01-13 DIAGNOSIS — I771 Stricture of artery: Secondary | ICD-10-CM

## 2015-01-13 DIAGNOSIS — R5383 Other fatigue: Secondary | ICD-10-CM | POA: Diagnosis not present

## 2015-01-13 DIAGNOSIS — R0602 Shortness of breath: Secondary | ICD-10-CM

## 2015-01-13 DIAGNOSIS — I340 Nonrheumatic mitral (valve) insufficiency: Secondary | ICD-10-CM | POA: Insufficient documentation

## 2015-01-13 DIAGNOSIS — R059 Cough, unspecified: Secondary | ICD-10-CM

## 2015-01-13 DIAGNOSIS — R531 Weakness: Secondary | ICD-10-CM | POA: Insufficient documentation

## 2015-01-13 DIAGNOSIS — L03116 Cellulitis of left lower limb: Secondary | ICD-10-CM | POA: Insufficient documentation

## 2015-01-13 DIAGNOSIS — Z79899 Other long term (current) drug therapy: Secondary | ICD-10-CM | POA: Diagnosis not present

## 2015-01-13 DIAGNOSIS — R05 Cough: Secondary | ICD-10-CM

## 2015-01-13 DIAGNOSIS — Z87891 Personal history of nicotine dependence: Secondary | ICD-10-CM | POA: Diagnosis not present

## 2015-01-13 DIAGNOSIS — D591 Other autoimmune hemolytic anemias: Secondary | ICD-10-CM

## 2015-01-13 DIAGNOSIS — C9110 Chronic lymphocytic leukemia of B-cell type not having achieved remission: Secondary | ICD-10-CM

## 2015-01-13 DIAGNOSIS — E538 Deficiency of other specified B group vitamins: Secondary | ICD-10-CM

## 2015-01-13 DIAGNOSIS — Z8711 Personal history of peptic ulcer disease: Secondary | ICD-10-CM

## 2015-01-13 DIAGNOSIS — R609 Edema, unspecified: Secondary | ICD-10-CM | POA: Diagnosis not present

## 2015-01-13 DIAGNOSIS — J449 Chronic obstructive pulmonary disease, unspecified: Secondary | ICD-10-CM | POA: Insufficient documentation

## 2015-01-13 DIAGNOSIS — Z87442 Personal history of urinary calculi: Secondary | ICD-10-CM | POA: Insufficient documentation

## 2015-01-13 DIAGNOSIS — J42 Unspecified chronic bronchitis: Secondary | ICD-10-CM | POA: Insufficient documentation

## 2015-01-13 LAB — SAMPLE TO BLOOD BANK

## 2015-01-13 LAB — CBC WITH DIFFERENTIAL/PLATELET
BASOS ABS: 0.1 10*3/uL (ref 0–0.1)
BASOS PCT: 1 %
EOS ABS: 0 10*3/uL (ref 0–0.7)
EOS PCT: 0 %
HCT: 40.9 % (ref 40.0–52.0)
HEMOGLOBIN: 13.7 g/dL (ref 13.0–18.0)
LYMPHS ABS: 1.3 10*3/uL (ref 1.0–3.6)
Lymphocytes Relative: 12 %
MCH: 32.5 pg (ref 26.0–34.0)
MCHC: 33.5 g/dL (ref 32.0–36.0)
MCV: 97.2 fL (ref 80.0–100.0)
Monocytes Absolute: 0.3 10*3/uL (ref 0.2–1.0)
Monocytes Relative: 3 %
NEUTROS PCT: 84 %
Neutro Abs: 9.8 10*3/uL — ABNORMAL HIGH (ref 1.4–6.5)
PLATELETS: 450 10*3/uL — AB (ref 150–440)
RBC: 4.2 MIL/uL — AB (ref 4.40–5.90)
RDW: 13.9 % (ref 11.5–14.5)
WBC: 11.5 10*3/uL — AB (ref 3.8–10.6)

## 2015-01-13 NOTE — Telephone Encounter (Signed)
Asking to be seen and have labs drawn today, feels he may need a blood transfusion

## 2015-01-13 NOTE — Progress Notes (Signed)
[place] @ [facility] Telephone:(336) [phone]  Fax:(336) [phone]     Glade Strausser OB: 1929-09-03  MR#: [id number]  ZHG#:[id number]  Patient Care Team: Juline Patch, MD as PCP - General (Family Medicine) Kathrynn Ducking, MD (Neurology) Forest Gleason, MD as Consulting Physician (Unknown Physician Specialty) Carlena Bjornstad, MD as Consulting Physician (Cardiology) Seeplaputhur Robinette Haines, MD (General Surgery)  CHIEF COMPLAINT:  Chief Complaint  Patient presents with  . Acute Visit   Oncology History   Chief Complaint/Problem List  1.  Autoimmune hemolytic anemia. Previously received Prednisone taper.   2.  Splenectomy in 5/02.  3.  Gastric ulcer diagnosed on endoscopy and multiple biopsies are pending, November 2005. 4.  Chronic lymphocytic leukemia. 5.vitamin B 12 deficiency 6.  Recent admission in the hospital in June of 2016 with hemolytic anemia and hemoglobin of 5.5 patient received blood transfusion was started on steroid 7.  Patient was started on rituximab (October 20, 2014        Oncology Flowsheet 11/23/2014 11/24/2014 11/25/2014 11/26/2014 11/27/2014 11/28/2014 01/01/2015  cyanocobalamin ((VITAMIN B-12)) IM - - - - - - -  enoxaparin (LOVENOX) [place] 40 mg 40 mg 40 mg - 40 mg 40 mg -  methylPREDNISolone sodium succinate 40 mg/mL (SOLU-MEDROL) IV - - - - - - -  predniSONE (DELTASONE) PO 10 mg 10 mg 10 mg 10 mg 10 mg 10 mg -  riTUXimab (RITUXAN) IV - - - - - - 375 mg/m2  zolendronic acid (ZOMETA) IV - - - - - - -    INTERVAL HISTORY:   81 year old gentleman came today for further follow-up regarding cellulitis left thigh and groin area.  Patient came as an acute add-on complaining of shortness of breath.  Started few days ago.  Feeling washed out.  Low-grade fever patient started down Levaquin given by primary physician also using albuterol inhaler and Mucinex worried about his hemoglobin.  Taking prednisone 2 times a week REVIEW OF SYSTEMS:    general status: Patient is feeling  weak and tired.  No change in a performance status.  No chills.  No fever. HEENT: No evidence of stomatitis Lungs as per history of present illness Cardiac: No chest pain or paroxysmal nocturnal dyspnea GI: No nausea no vomiting no diarrhea no abdominal pain Skin: No rash Lower extremity no swelling Neurological system: No tingling.  No numbness.  No other focal signs Musculoskeletal system no bony pains  All other systems have been reviewed As per HPI. Otherwise, a complete review of systems is negatve.  PAST MEDICAL HISTORY: Past Medical History  Diagnosis Date  . CLL (chronic lymphoblastic leukemia) 2006    Rituxan treatment 2006  . Atrial septal aneurysm 2009    Insignificant, no, January, 2009  . Infection of PEG site     cellulitis.Marland KitchenResolved  . Vertebral compression fracture     multiple levels  . Herpes zoster     right chest  . Aortic insufficiency 2009    mild, Mild, echo, 2012  . Diastolic dysfunction     mild  . Chest pain     EF 55%, echo, October, 2009, possible inferior and posterior borderline hypokinesis, patient has never  had cardiac catheterization  . Edema     EF 55%, echo, October, 2009, possible borderline inferior and posterior hypokinesis... heart catheterization has never been done(July 10, 2009)  . Myositis     Caused pulmonary insufficiency with muscle weakness in the past / Dr.Willis-neurology, Imuran and prednisone  . Calculus of  kidney   . Inguinal hernia without mention of obstruction or gangrene, unilateral or unspecified, (not specified as recurrent)     right   . Unspecified gastritis and gastroduodenitis without mention of hemorrhage   . Abdominal pain, epigastric   . Nonspecific elevation of levels of transaminase or lactic acid dehydrogenase (LDH)   . Feeding difficulties and mismanagement   . Edema of male genital organs     Resolved, occurred during illness related to myositis  . Swelling of arm     left.Marland KitchenMarland KitchenResolved.... no DVT  .  LFT elevation     Related to Imuran in the past... dose was adjusted  . Dyslipidemia     statins not used due to LFT abnormalities  . Shortness of breath     Breathing abnormalities related to muscle weakness from myositis  . RBBB (right bundle branch block)     Old  . Bradycardia     Sinus bradycardia, old  . Ejection fraction     EF 60%, echo, October, 2012  . Mitral regurgitation     Mild / moderate, echo, 2012  . Right ventricular dysfunction     Mild, echo, 2012  . Febrile illness     May, 2013, question sinusitis  . Polymyositis 11/26/2012  . Hemolytic anemia   . Cellulitis     PAST SURGICAL HISTORY: Past Surgical History  Procedure Laterality Date  . Splenectomy    . Hernia repair      left side  . Peg placement  06/2007  . Peg tube removal  12/2007  . Kyphosis surgery      multilevel vertebral  . Odontoid fracture surgery      anterior screw fixation  . Nasal sinus surgery      x2  . Cataract extraction      bi lateral    FAMILY HISTORY Family History  Problem Relation Age of Onset  . Breast cancer Sister   . Colon cancer Brother   . Prostate cancer Brother   . Stroke Mother   . Hypertension Mother   . Emphysema Father   . Diabetes Grandchild   . Heart attack Neg Hx   . Cancer Brother   . Cancer Sister   . Cancer Daughter     ADVANCED DIRECTIVES:  Patient does have advance healthcare directive, Patient   does not desire to make any changes  HEALTH MAINTENANCE: Social History  Substance Use Topics  . Smoking status: Former Smoker -- 0.50 packs/day for 31 years    Types: Cigarettes    Quit date: 06/29/1968  . Smokeless tobacco: Former Systems developer    Types: Chew  . Alcohol Use: No     Comment: rarely      Allergies  Allergen Reactions  . Sulfonamide Derivatives     Current Outpatient Prescriptions  Medication Sig Dispense Refill  . albuterol (PROVENTIL) (2.5 MG/3ML) 0.083% nebulizer solution Take 3 mLs (2.5 mg total) by nebulization every 6  (six) hours as needed for wheezing or shortness of breath. 75 mL 12  . calcium-vitamin D (CALCIUM 500+D) 500-200 MG-UNIT per tablet Take 1 tablet by mouth 2 (two) times daily.      . Cholecalciferol (VITAMIN D3) 1000 UNITS tablet Take 1,000 Units by mouth daily.      . citalopram (CELEXA) 40 MG tablet Take 1 tablet (40 mg total) by mouth daily. 90 tablet 1  . cyanocobalamin (,VITAMIN B-12,) 1000 MCG/ML injection Inject 1,000 mcg into the muscle every 14 (fourteen) days.  At the first of the month    . ferrous sulfate 325 (65 FE) MG tablet Take 325 mg by mouth daily with breakfast.      . fish oil-omega-3 fatty acids 1000 MG capsule Take 1 g by mouth daily at 12 noon.     . folic acid (FOLVITE) 1 MG tablet Take 4 tablets (4 mg total) by mouth daily. 120 tablet 0  . furosemide (LASIX) 80 MG tablet Take 80 mg by mouth.    Marland Kitchen guaiFENesin-codeine 100-10 MG/5ML syrup Take 5 mLs by mouth 3 (three) times daily as needed. 120 mL 0  . HYDROcodone-acetaminophen (NORCO/VICODIN) 5-325 MG per tablet Take 1 tablet by mouth every 6 (six) hours as needed. 120 tablet 0  . levofloxacin (LEVAQUIN) 500 MG tablet Take 1 tablet (500 mg total) by mouth daily. 7 tablet 0  . montelukast (SINGULAIR) 10 MG tablet Take 10 mg by mouth at bedtime.   11  . Multiple Vitamin (MULTIVITAMIN) capsule Take 1 capsule by mouth daily.      . mycophenolate (CELLCEPT) 500 MG tablet Take 1 tablet (500 mg total) by mouth daily. 90 tablet 3  . nystatin cream (MYCOSTATIN) APPLY TOPICALLY TWICE DAILY 30 g 0  . pantoprazole (PROTONIX) 40 MG tablet Take 1 tablet (40 mg total) by mouth daily at 12 noon. 90 tablet 1  . potassium chloride SA (K-DUR,KLOR-CON) 20 MEQ tablet Take 20 mEq by mouth daily. Mon and Fri he takes BID  2  . predniSONE (DELTASONE) 20 MG tablet Take 0.5 tablet by mouth 2 times a week 90 tablet 0  . PROAIR HFA 108 (90 BASE) MCG/ACT inhaler Inhale 2 puffs into the lungs 4 (four) times daily.   11  . Resource Beneprotein PACK Take  3g three times a day    . temazepam (RESTORIL) 30 MG capsule Take 1 capsule (30 mg total) by mouth at bedtime. 30 capsule 5   No current facility-administered medications for this visit.    OBJECTIVE:  Filed Vitals:   01/13/15 1352  BP: 124/63  Pulse: 83  Temp: 96.5 F (35.8 C)     Body mass index is 21.09 kg/(m^2).    ECOG FS:1 - Symptomatic but completely ambulatory  PHYSICAL EXAM: Gen. status: Patient is feeling somewhat weak and tired.  Somewhat anxious. Lymphatic system: Supraclavicular, cervical, axillary, inguinal lymph nodes are not palpable Head exam was generally normal. There was no scleral icterus or corneal arcus. Mucous membranes were moist. Cardiac: Tachycardia Lungs: Kyphoscoliosis.  Diminished air entry on both sides.  Coarse crepitation at both bases. Abdominal exam revealed normal bowel sounds. The abdomen was soft, non-tender, and without masses, organomegaly, or appreciable enlargement of the abdominal aorta. Examination of the skin revealed no evidence of significant rashes, suspicious appearing nevi or other concerning lesions. Neurologically, the patient was awake, alert, and oriented to person, place and time. There were no obvious focal neurologic abnormalities. Redness and tenderness in the left thigh has improved.  Mild induration persist surgical evaluation has been reviewed.  No further surgical intervention has been planned  Kyphoscoliosis: Osteoporosis and multiple vertebral compression fracture  Skin: Grade 1 rash LAB RESULTS:  Appointment on 01/13/2015  Component Date Value Ref Range Status  . WBC 01/13/2015 11.5* 3.8 - 10.6 K/uL Final  . RBC 01/13/2015 4.20* 4.40 - 5.90 MIL/uL Final  . Hemoglobin 01/13/2015 13.7  13.0 - 18.0 g/dL Final  . HCT 01/13/2015 40.9  40.0 - 52.0 % Final  . MCV 01/13/2015 97.2  80.0 - 100.0 fL Final  . MCH 01/13/2015 32.5  26.0 - 34.0 pg Final  . MCHC 01/13/2015 33.5  32.0 - 36.0 g/dL Final  . RDW 01/13/2015 13.9   11.5 - 14.5 % Final  . Platelets 01/13/2015 450* 150 - 440 K/uL Final  . Neutrophils Relative % 01/13/2015 84   Final  . Neutro Abs 01/13/2015 9.8* 1.4 - 6.5 K/uL Final  . Lymphocytes Relative 01/13/2015 12   Final  . Lymphs Abs 01/13/2015 1.3  1.0 - 3.6 K/uL Final  . Monocytes Relative 01/13/2015 3   Final  . Monocytes Absolute 01/13/2015 0.3  0.2 - 1.0 K/uL Final  . Eosinophils Relative 01/13/2015 0   Final  . Eosinophils Absolute 01/13/2015 0.0  0 - 0.7 K/uL Final  . Basophils Relative 01/13/2015 1   Final  . Basophils Absolute 01/13/2015 0.1  0 - 0.1 K/uL Final  . Blood Bank Specimen 01/13/2015 SAMPLE AVAILABLE FOR TESTING   Final  . Sample Expiration 01/13/2015 01/16/2015   Final      STUDIES: Chest x-ray has been ordered pending will be reviewed ASSESSMENT:/MEDICAL DECISION MAKING: Patient came as an acute add-on most likely problem is related to Acute pulmonary infection.  Review chest x-ray.  Patient is on Levaquin which will be continued for 7 days prednisone will be given daily for 15 days continue Mucinex and albuterol if patient has developed pneumonia and then intravenous antibiotic would be considered.  In blood culture and sputum culture will be   Obtained  2.  Cellulitis of the thigh Improving.  Induration persist.  We will stop all antibiotics at present time and continue to monitor redness patient was instructed to call me if spikes high fever.  Or any more progressive redness or pain. 3.  CLL with autoimmune hemolytic anemia 4.  Autoimmune hemolytic anemia.  Hemoglobin has dropped to 9.8 so prednisone would be increase daily and recheck hemoglobin in next few days unless patient continues to feel weak and tired. We will initiate maintenance Rituxan therapy carefully managed during hemoglobin and white count.  And gradually taking patient off prednisone therapy       No matching staging information was found for the patient.  Forest Gleason, MD   01/13/2015 2:00  PM

## 2015-01-13 NOTE — Telephone Encounter (Signed)
Pt will come in at 115 for lab and then see md per dr Jeb Levering

## 2015-01-13 NOTE — Progress Notes (Signed)
Patient does have living will.  Former smoker.  Patient acute add on for fatigue, decreased appetite and sob.

## 2015-01-29 ENCOUNTER — Inpatient Hospital Stay (HOSPITAL_BASED_OUTPATIENT_CLINIC_OR_DEPARTMENT_OTHER): Payer: Medicare Other | Admitting: Oncology

## 2015-01-29 ENCOUNTER — Inpatient Hospital Stay: Payer: Medicare Other

## 2015-01-29 VITALS — BP 128/77 | HR 68 | Temp 97.0°F | Resp 18 | Wt 128.3 lb

## 2015-01-29 DIAGNOSIS — L03116 Cellulitis of left lower limb: Secondary | ICD-10-CM

## 2015-01-29 DIAGNOSIS — D591 Other autoimmune hemolytic anemias: Secondary | ICD-10-CM

## 2015-01-29 DIAGNOSIS — Z8711 Personal history of peptic ulcer disease: Secondary | ICD-10-CM

## 2015-01-29 DIAGNOSIS — C9110 Chronic lymphocytic leukemia of B-cell type not having achieved remission: Secondary | ICD-10-CM

## 2015-01-29 DIAGNOSIS — E785 Hyperlipidemia, unspecified: Secondary | ICD-10-CM

## 2015-01-29 DIAGNOSIS — Z79899 Other long term (current) drug therapy: Secondary | ICD-10-CM | POA: Diagnosis not present

## 2015-01-29 DIAGNOSIS — R5383 Other fatigue: Secondary | ICD-10-CM

## 2015-01-29 DIAGNOSIS — E538 Deficiency of other specified B group vitamins: Secondary | ICD-10-CM | POA: Diagnosis not present

## 2015-01-29 DIAGNOSIS — R531 Weakness: Secondary | ICD-10-CM

## 2015-01-29 DIAGNOSIS — D594 Other nonautoimmune hemolytic anemias: Principal | ICD-10-CM

## 2015-01-29 DIAGNOSIS — D479 Neoplasm of uncertain behavior of lymphoid, hematopoietic and related tissue, unspecified: Secondary | ICD-10-CM

## 2015-01-29 DIAGNOSIS — Z87442 Personal history of urinary calculi: Secondary | ICD-10-CM

## 2015-01-29 DIAGNOSIS — Z87891 Personal history of nicotine dependence: Secondary | ICD-10-CM

## 2015-01-29 DIAGNOSIS — R609 Edema, unspecified: Secondary | ICD-10-CM

## 2015-01-29 DIAGNOSIS — I771 Stricture of artery: Secondary | ICD-10-CM

## 2015-01-29 DIAGNOSIS — I340 Nonrheumatic mitral (valve) insufficiency: Secondary | ICD-10-CM

## 2015-01-29 DIAGNOSIS — I4510 Unspecified right bundle-branch block: Secondary | ICD-10-CM

## 2015-01-29 DIAGNOSIS — R0602 Shortness of breath: Secondary | ICD-10-CM

## 2015-01-29 LAB — COMPREHENSIVE METABOLIC PANEL
ALT: 14 U/L — ABNORMAL LOW (ref 17–63)
AST: 31 U/L (ref 15–41)
Albumin: 4 g/dL (ref 3.5–5.0)
Alkaline Phosphatase: 64 U/L (ref 38–126)
Anion gap: 4 — ABNORMAL LOW (ref 5–15)
BILIRUBIN TOTAL: 1.2 mg/dL (ref 0.3–1.2)
BUN: 19 mg/dL (ref 6–20)
CO2: 28 mmol/L (ref 22–32)
Calcium: 8.4 mg/dL — ABNORMAL LOW (ref 8.9–10.3)
Chloride: 104 mmol/L (ref 101–111)
Creatinine, Ser: 0.73 mg/dL (ref 0.61–1.24)
GFR calc Af Amer: >60 mL/min (ref >60)
Glucose, Bld: 147 mg/dL — ABNORMAL HIGH (ref 65–99)
POTASSIUM: 4.2 mmol/L (ref 3.5–5.1)
Sodium: 136 mmol/L (ref 135–145)
TOTAL PROTEIN: 6.5 g/dL (ref 6.5–8.1)

## 2015-01-29 LAB — CBC WITH DIFFERENTIAL/PLATELET
BASOS ABS: 0.1 10*3/uL (ref 0–0.1)
Basophils Relative: 1 %
Eosinophils Absolute: 0 10*3/uL (ref 0–0.7)
Eosinophils Relative: 0 %
HEMATOCRIT: 42.6 % (ref 40.0–52.0)
Hemoglobin: 14.1 g/dL (ref 13.0–18.0)
LYMPHS ABS: 1 10*3/uL (ref 1.0–3.6)
LYMPHS PCT: 10 %
MCH: 31.9 pg (ref 26.0–34.0)
MCHC: 33 g/dL (ref 32.0–36.0)
MCV: 96.7 fL (ref 80.0–100.0)
MONO ABS: 0.7 10*3/uL (ref 0.2–1.0)
MONOS PCT: 7 %
NEUTROS ABS: 8.4 10*3/uL — AB (ref 1.4–6.5)
Neutrophils Relative %: 82 %
Platelets: 302 10*3/uL (ref 150–440)
RBC: 4.4 MIL/uL (ref 4.40–5.90)
RDW: 15 % — AB (ref 11.5–14.5)
WBC: 10.3 10*3/uL (ref 3.8–10.6)

## 2015-01-29 LAB — MAGNESIUM: MAGNESIUM: 2 mg/dL (ref 1.7–2.4)

## 2015-01-29 NOTE — Progress Notes (Signed)
Patient states that he generally does not feel well.  Feels sluggish.  States he continues to have cough and congestion.

## 2015-01-30 ENCOUNTER — Encounter: Payer: Self-pay | Admitting: Oncology

## 2015-01-30 NOTE — Progress Notes (Signed)
[name] @ [facility] Telephone:(336) [phone]  Fax:(336) [phone]     Jerrod Damiano OB: 1929/06/23  MR#: [id number]  IRS#:[id number]  Patient Care Team: Juline Patch, MD as PCP - General (Family Medicine) Kathrynn Ducking, MD (Neurology) Forest Gleason, MD as Consulting Physician (Unknown Physician Specialty) Carlena Bjornstad, MD as Consulting Physician (Cardiology) Seeplaputhur Robinette Haines, MD (General Surgery)  CHIEF COMPLAINT:  Chief Complaint  Patient presents with  . OTHER   Oncology History   Chief Complaint/Problem List  1.  Autoimmune hemolytic anemia. Previously received Prednisone taper.   2.  Splenectomy in 5/02.  3.  Gastric ulcer diagnosed on endoscopy and multiple biopsies are pending, November 2005. 4.  Chronic lymphocytic leukemia. 5.vitamin B 12 deficiency 6.  Recent admission in the hospital in June of 2016 with hemolytic anemia and hemoglobin of 5.5 patient received blood transfusion was started on steroid 7.  Patient was started on rituximab (October 20, 2014        Oncology Flowsheet 11/23/2014 11/24/2014 11/25/2014 11/26/2014 11/27/2014 11/28/2014 01/01/2015  cyanocobalamin ((VITAMIN B-12)) IM - - - - - - -  enoxaparin (LOVENOX) [place] 40 mg 40 mg 40 mg - 40 mg 40 mg -  methylPREDNISolone sodium succinate 40 mg/mL (SOLU-MEDROL) IV - - - - - - -  predniSONE (DELTASONE) PO 10 mg 10 mg 10 mg 10 mg 10 mg 10 mg -  riTUXimab (RITUXAN) IV - - - - - - 375 mg/m2  zolendronic acid (ZOMETA) IV - - - - - - -    INTERVAL HISTORY:   81 year old gentleman came today for further follow-up regarding cellulitis left thigh and groin area.  Is and is chronic lymphocytic leukemia with hemolytic anemia Prednisone 10 mg daily at present time Feeling slightly stronger.  Appetite is gradually improving.  Left groin pain is getting better  REVIEW OF SYSTEMS:    general status: Patient is feeling weak and tired.  No change in a performance status.  No chills.  No fever. HEENT: No evidence  of stomatitis Lungs as per history of present illness Cardiac: No chest pain or paroxysmal nocturnal dyspnea GI: No nausea no vomiting no diarrhea no abdominal pain Skin: No rash Lower extremity no swelling Neurological system: No tingling.  No numbness.  No other focal signs Musculoskeletal system no bony pains Left groin: Redness has improved swelling is getting better All other systems have been reviewed As per HPI. Otherwise, a complete review of systems is negatve.  PAST MEDICAL HISTORY: Past Medical History  Diagnosis Date  . CLL (chronic lymphoblastic leukemia) 2006    Rituxan treatment 2006  . Atrial septal aneurysm 2009    Insignificant, no, January, 2009  . Infection of PEG site ([place])     cellulitis.Marland KitchenResolved  . Vertebral compression fracture ([facility])     multiple levels  . Herpes zoster     right chest  . Aortic insufficiency 2009    mild, Mild, echo, 2012  . Diastolic dysfunction     mild  . Chest pain     EF 55%, echo, October, 2009, possible inferior and posterior borderline hypokinesis, patient has never  had cardiac catheterization  . Edema     EF 55%, echo, October, 2009, possible borderline inferior and posterior hypokinesis... heart catheterization has never been done(July 10, 2009)  . Myositis     Caused pulmonary insufficiency with muscle weakness in the past / Dr.Willis-neurology, Imuran and prednisone  . Calculus of kidney   . Inguinal hernia without  mention of obstruction or gangrene, unilateral or unspecified, (not specified as recurrent)     right   . Unspecified gastritis and gastroduodenitis without mention of hemorrhage   . Abdominal pain, epigastric   . Nonspecific elevation of levels of transaminase or lactic acid dehydrogenase (LDH)   . Feeding difficulties and mismanagement   . Edema of male genital organs     Resolved, occurred during illness related to myositis  . Swelling of arm     left.Marland KitchenMarland KitchenResolved.... no DVT  . LFT elevation     Related  to Imuran in the past... dose was adjusted  . Dyslipidemia     statins not used due to LFT abnormalities  . Shortness of breath     Breathing abnormalities related to muscle weakness from myositis  . RBBB (right bundle branch block)     Old  . Bradycardia     Sinus bradycardia, old  . Ejection fraction     EF 60%, echo, October, 2012  . Mitral regurgitation     Mild / moderate, echo, 2012  . Right ventricular dysfunction     Mild, echo, 2012  . Febrile illness     May, 2013, question sinusitis  . Polymyositis ([place]) 11/26/2012  . Hemolytic anemia ([place])   . Cellulitis     PAST SURGICAL HISTORY: Past Surgical History  Procedure Laterality Date  . Splenectomy    . Hernia repair      left side  . Peg placement  06/2007  . Peg tube removal  12/2007  . Kyphosis surgery      multilevel vertebral  . Odontoid fracture surgery      anterior screw fixation  . Nasal sinus surgery      x2  . Cataract extraction      bi lateral    FAMILY HISTORY Family History  Problem Relation Age of Onset  . Breast cancer Sister   . Colon cancer Brother   . Prostate cancer Brother   . Stroke Mother   . Hypertension Mother   . Emphysema Father   . Diabetes Grandchild   . Heart attack Neg Hx   . Cancer Brother   . Cancer Sister   . Cancer Daughter     ADVANCED DIRECTIVES:  Patient does have advance healthcare directive, Patient   does not desire to make any changes  HEALTH MAINTENANCE: Social History  Substance Use Topics  . Smoking status: Former Smoker -- 0.50 packs/day for 31 years    Types: Cigarettes    Quit date: 06/29/1968  . Smokeless tobacco: Former Systems developer    Types: Chew  . Alcohol Use: No     Comment: rarely      Allergies  Allergen Reactions  . Sulfonamide Derivatives     Current Outpatient Prescriptions  Medication Sig Dispense Refill  . albuterol (PROVENTIL) (2.5 MG/3ML) 0.083% nebulizer solution Take 3 mLs (2.5 mg total) by nebulization every 6 (six) hours as  needed for wheezing or shortness of breath. 75 mL 12  . calcium-vitamin D (CALCIUM 500+D) 500-200 MG-UNIT per tablet Take 1 tablet by mouth 2 (two) times daily.      . Cholecalciferol (VITAMIN D3) 1000 UNITS tablet Take 1,000 Units by mouth daily.      . citalopram (CELEXA) 40 MG tablet Take 1 tablet (40 mg total) by mouth daily. 90 tablet 1  . cyanocobalamin (,VITAMIN B-12,) 1000 MCG/ML injection Inject 1,000 mcg into the muscle every 14 (fourteen) days. At the first of the  month    . ferrous sulfate 325 (65 FE) MG tablet Take 325 mg by mouth daily with breakfast.      . fish oil-omega-3 fatty acids 1000 MG capsule Take 1 g by mouth daily at 12 noon.     . folic acid (FOLVITE) 1 MG tablet Take 4 tablets (4 mg total) by mouth daily. 120 tablet 0  . furosemide (LASIX) 80 MG tablet Take 80 mg by mouth.    Marland Kitchen guaiFENesin-codeine 100-10 MG/5ML syrup Take 5 mLs by mouth 3 (three) times daily as needed. 120 mL 0  . HYDROcodone-acetaminophen (NORCO/VICODIN) 5-325 MG per tablet Take 1 tablet by mouth every 6 (six) hours as needed. 120 tablet 0  . levofloxacin (LEVAQUIN) 500 MG tablet Take 1 tablet (500 mg total) by mouth daily. 7 tablet 0  . montelukast (SINGULAIR) 10 MG tablet Take 10 mg by mouth at bedtime.   11  . Multiple Vitamin (MULTIVITAMIN) capsule Take 1 capsule by mouth daily.      . mycophenolate (CELLCEPT) 500 MG tablet Take 1 tablet (500 mg total) by mouth daily. 90 tablet 3  . nystatin cream (MYCOSTATIN) APPLY TOPICALLY TWICE DAILY 30 g 0  . pantoprazole (PROTONIX) 40 MG tablet Take 1 tablet (40 mg total) by mouth daily at 12 noon. 90 tablet 1  . potassium chloride SA (K-DUR,KLOR-CON) 20 MEQ tablet Take 20 mEq by mouth daily. Mon and Fri he takes BID  2  . predniSONE (DELTASONE) 20 MG tablet Take 0.5 tablet by mouth 2 times a week 90 tablet 0  . PROAIR HFA 108 (90 BASE) MCG/ACT inhaler Inhale 2 puffs into the lungs 4 (four) times daily.   11  . Resource Beneprotein PACK Take 3g three times a  day    . temazepam (RESTORIL) 30 MG capsule Take 1 capsule (30 mg total) by mouth at bedtime. 30 capsule 5   No current facility-administered medications for this visit.    OBJECTIVE:  Filed Vitals:   01/29/15 1217  BP: 128/77  Pulse: 68  Temp: 97 F (36.1 C)  Resp: 18     Body mass index is 21.35 kg/(m^2).    ECOG FS:1 - Symptomatic but completely ambulatory  PHYSICAL EXAM: Gen. status: Patient is feeling somewhat weak and tired.  Somewhat anxious. Lymphatic system: Supraclavicular, cervical, axillary, inguinal lymph nodes are not palpable Head exam was generally normal. There was no scleral icterus or corneal arcus. Mucous membranes were moist. Cardiac: Tachycardia Lungs: Kyphoscoliosis.  Diminished air entry on both sides.  Coarse crepitation at both bases. Abdominal exam revealed normal bowel sounds. The abdomen was soft, non-tender, and without masses, organomegaly, or appreciable enlargement of the abdominal aorta. Examination of the skin revealed no evidence of significant rashes, suspicious appearing nevi or other concerning lesions. Neurologically, the patient was awake, alert, and oriented to person, place and time. There were no obvious focal neurologic abnormalities. Redness and tenderness in the left thigh has improved.  Mild induration persist surgical evaluation has been reviewed.  No further surgical intervention has been planned  Kyphoscoliosis: Osteoporosis and multiple vertebral compression fracture  Skin: Grade 1 rash LAB RESULTS:  Appointment on 01/29/2015  Component Date Value Ref Range Status  . WBC 01/29/2015 10.3  3.8 - 10.6 K/uL Final  . RBC 01/29/2015 4.40  4.40 - 5.90 MIL/uL Final  . Hemoglobin 01/29/2015 14.1  13.0 - 18.0 g/dL Final  . HCT 01/29/2015 42.6  40.0 - 52.0 % Final  . MCV 01/29/2015 96.7  80.0 - 100.0 fL Final  . MCH 01/29/2015 31.9  26.0 - 34.0 pg Final  . MCHC 01/29/2015 33.0  32.0 - 36.0 g/dL Final  . RDW 01/29/2015 15.0* 11.5 -  14.5 % Final  . Platelets 01/29/2015 302  150 - 440 K/uL Final  . Neutrophils Relative % 01/29/2015 82   Final  . Neutro Abs 01/29/2015 8.4* 1.4 - 6.5 K/uL Final  . Lymphocytes Relative 01/29/2015 10   Final  . Lymphs Abs 01/29/2015 1.0  1.0 - 3.6 K/uL Final  . Monocytes Relative 01/29/2015 7   Final  . Monocytes Absolute 01/29/2015 0.7  0.2 - 1.0 K/uL Final  . Eosinophils Relative 01/29/2015 0   Final  . Eosinophils Absolute 01/29/2015 0.0  0 - 0.7 K/uL Final  . Basophils Relative 01/29/2015 1   Final  . Basophils Absolute 01/29/2015 0.1  0 - 0.1 K/uL Final  . Sodium 01/29/2015 136  135 - 145 mmol/L Final  . Potassium 01/29/2015 4.2  3.5 - 5.1 mmol/L Final  . Chloride 01/29/2015 104  101 - 111 mmol/L Final  . CO2 01/29/2015 28  22 - 32 mmol/L Final  . Glucose, Bld 01/29/2015 147* 65 - 99 mg/dL Final  . BUN 01/29/2015 19  6 - 20 mg/dL Final  . Creatinine, Ser 01/29/2015 0.73  0.61 - 1.24 mg/dL Final  . Calcium 01/29/2015 8.4* 8.9 - 10.3 mg/dL Final  . Total Protein 01/29/2015 6.5  6.5 - 8.1 g/dL Final  . Albumin 01/29/2015 4.0  3.5 - 5.0 g/dL Final  . AST 01/29/2015 31  15 - 41 U/L Final  . ALT 01/29/2015 14* 17 - 63 U/L Final  . Alkaline Phosphatase 01/29/2015 64  38 - 126 U/L Final  . Total Bilirubin 01/29/2015 1.2  0.3 - 1.2 mg/dL Final  . GFR calc non Af Amer 01/29/2015 >60  >60 mL/min Final  . GFR calc Af Amer 01/29/2015 >60  >60 mL/min Final   Comment: (NOTE) The eGFR has been calculated using the CKD EPI equation. This calculation has not been validated in all clinical situations. eGFR's persistently <60 mL/min signify possible Chronic Kidney Disease.   . Anion gap 01/29/2015 4* 5 - 15 Final  . Magnesium 01/29/2015 2.0  1.7 - 2.4 mg/dL Final      STUDIES: Chest x-ray has been ordered pending will be reviewed ASSESSMENT:/MEDICAL DECISION MAKING: Chronic lymphocytic leukemia on Rituxan Autoimmune hemolytic anemia at present time hemoglobin is improved to 14.1.   Prednisone will be decreased to 4 days a week (10 mg dose:) Cellulitis of the left thigh patient is off antibiotics mild redness persistent swelling      No matching staging information was found for the patient.  Forest Gleason, MD   01/30/2015 8:39 AM

## 2015-02-10 ENCOUNTER — Other Ambulatory Visit: Payer: Self-pay

## 2015-02-20 ENCOUNTER — Other Ambulatory Visit: Payer: Self-pay | Admitting: Specialist

## 2015-02-20 DIAGNOSIS — R05 Cough: Secondary | ICD-10-CM

## 2015-02-20 DIAGNOSIS — R059 Cough, unspecified: Secondary | ICD-10-CM

## 2015-02-20 DIAGNOSIS — R0602 Shortness of breath: Secondary | ICD-10-CM

## 2015-02-26 ENCOUNTER — Inpatient Hospital Stay (HOSPITAL_BASED_OUTPATIENT_CLINIC_OR_DEPARTMENT_OTHER): Payer: Medicare Other | Admitting: Oncology

## 2015-02-26 ENCOUNTER — Ambulatory Visit
Admission: RE | Admit: 2015-02-26 | Discharge: 2015-02-26 | Disposition: A | Discharge status: Home / Self Care | Payer: Medicare Other | Source: Ambulatory Visit | Attending: Specialist | Admitting: Specialist

## 2015-02-26 ENCOUNTER — Inpatient Hospital Stay: Payer: Medicare Other

## 2015-02-26 ENCOUNTER — Ambulatory Visit: Payer: Medicare Other

## 2015-02-26 ENCOUNTER — Encounter: Payer: Self-pay | Admitting: Oncology

## 2015-02-26 ENCOUNTER — Inpatient Hospital Stay: Payer: Medicare Other | Attending: Oncology

## 2015-02-26 VITALS — BP 134/81 | HR 84 | Temp 96.6°F | Wt 130.7 lb

## 2015-02-26 VITALS — BP 125/66 | HR 60 | Resp 20

## 2015-02-26 DIAGNOSIS — D594 Other nonautoimmune hemolytic anemias: Secondary | ICD-10-CM

## 2015-02-26 DIAGNOSIS — I771 Stricture of artery: Secondary | ICD-10-CM | POA: Diagnosis not present

## 2015-02-26 DIAGNOSIS — D591 Other autoimmune hemolytic anemias: Secondary | ICD-10-CM

## 2015-02-26 DIAGNOSIS — I340 Nonrheumatic mitral (valve) insufficiency: Secondary | ICD-10-CM

## 2015-02-26 DIAGNOSIS — E538 Deficiency of other specified B group vitamins: Secondary | ICD-10-CM

## 2015-02-26 DIAGNOSIS — R531 Weakness: Secondary | ICD-10-CM | POA: Diagnosis not present

## 2015-02-26 DIAGNOSIS — R918 Other nonspecific abnormal finding of lung field: Secondary | ICD-10-CM | POA: Diagnosis not present

## 2015-02-26 DIAGNOSIS — Z809 Family history of malignant neoplasm, unspecified: Secondary | ICD-10-CM

## 2015-02-26 DIAGNOSIS — Z87442 Personal history of urinary calculi: Secondary | ICD-10-CM | POA: Diagnosis not present

## 2015-02-26 DIAGNOSIS — Z87898 Personal history of other specified conditions: Secondary | ICD-10-CM | POA: Insufficient documentation

## 2015-02-26 DIAGNOSIS — Z7952 Long term (current) use of systemic steroids: Secondary | ICD-10-CM | POA: Insufficient documentation

## 2015-02-26 DIAGNOSIS — Z803 Family history of malignant neoplasm of breast: Secondary | ICD-10-CM

## 2015-02-26 DIAGNOSIS — L03116 Cellulitis of left lower limb: Secondary | ICD-10-CM | POA: Diagnosis not present

## 2015-02-26 DIAGNOSIS — Z8781 Personal history of (healed) traumatic fracture: Secondary | ICD-10-CM | POA: Insufficient documentation

## 2015-02-26 DIAGNOSIS — R5383 Other fatigue: Secondary | ICD-10-CM | POA: Diagnosis not present

## 2015-02-26 DIAGNOSIS — Z800 Family history of malignant neoplasm of digestive organs: Secondary | ICD-10-CM | POA: Insufficient documentation

## 2015-02-26 DIAGNOSIS — Z87891 Personal history of nicotine dependence: Secondary | ICD-10-CM

## 2015-02-26 DIAGNOSIS — K4090 Unilateral inguinal hernia, without obstruction or gangrene, not specified as recurrent: Secondary | ICD-10-CM | POA: Insufficient documentation

## 2015-02-26 DIAGNOSIS — Z5112 Encounter for antineoplastic immunotherapy: Secondary | ICD-10-CM | POA: Diagnosis not present

## 2015-02-26 DIAGNOSIS — R05 Cough: Secondary | ICD-10-CM | POA: Insufficient documentation

## 2015-02-26 DIAGNOSIS — C9110 Chronic lymphocytic leukemia of B-cell type not having achieved remission: Secondary | ICD-10-CM

## 2015-02-26 DIAGNOSIS — Z8711 Personal history of peptic ulcer disease: Secondary | ICD-10-CM | POA: Insufficient documentation

## 2015-02-26 DIAGNOSIS — J439 Emphysema, unspecified: Secondary | ICD-10-CM | POA: Insufficient documentation

## 2015-02-26 DIAGNOSIS — Z79899 Other long term (current) drug therapy: Secondary | ICD-10-CM | POA: Insufficient documentation

## 2015-02-26 DIAGNOSIS — R000 Tachycardia, unspecified: Secondary | ICD-10-CM | POA: Insufficient documentation

## 2015-02-26 DIAGNOSIS — I2510 Atherosclerotic heart disease of native coronary artery without angina pectoris: Secondary | ICD-10-CM | POA: Diagnosis not present

## 2015-02-26 DIAGNOSIS — R059 Cough, unspecified: Secondary | ICD-10-CM

## 2015-02-26 DIAGNOSIS — I4510 Unspecified right bundle-branch block: Secondary | ICD-10-CM | POA: Insufficient documentation

## 2015-02-26 DIAGNOSIS — Z9081 Acquired absence of spleen: Secondary | ICD-10-CM | POA: Insufficient documentation

## 2015-02-26 DIAGNOSIS — R0602 Shortness of breath: Secondary | ICD-10-CM | POA: Diagnosis present

## 2015-02-26 DIAGNOSIS — E785 Hyperlipidemia, unspecified: Secondary | ICD-10-CM | POA: Insufficient documentation

## 2015-02-26 DIAGNOSIS — M4856XA Collapsed vertebra, not elsewhere classified, lumbar region, initial encounter for fracture: Secondary | ICD-10-CM | POA: Insufficient documentation

## 2015-02-26 DIAGNOSIS — D479 Neoplasm of uncertain behavior of lymphoid, hematopoietic and related tissue, unspecified: Secondary | ICD-10-CM

## 2015-02-26 LAB — COMPREHENSIVE METABOLIC PANEL
ALT: 16 U/L — ABNORMAL LOW (ref 17–63)
ANION GAP: 3 — AB (ref 5–15)
AST: 30 U/L (ref 15–41)
Albumin: 3.6 g/dL (ref 3.5–5.0)
Alkaline Phosphatase: 71 U/L (ref 38–126)
BUN: 18 mg/dL (ref 6–20)
CALCIUM: 8.9 mg/dL (ref 8.9–10.3)
CHLORIDE: 105 mmol/L (ref 101–111)
CO2: 30 mmol/L (ref 22–32)
Creatinine, Ser: 0.74 mg/dL (ref 0.61–1.24)
GFR calc non Af Amer: >60 mL/min (ref >60)
Glucose, Bld: 94 mg/dL (ref 65–99)
POTASSIUM: 4.2 mmol/L (ref 3.5–5.1)
SODIUM: 138 mmol/L (ref 135–145)
Total Bilirubin: 1.2 mg/dL (ref 0.3–1.2)
Total Protein: 6.2 g/dL — ABNORMAL LOW (ref 6.5–8.1)

## 2015-02-26 LAB — CBC WITH DIFFERENTIAL/PLATELET
BASOS ABS: 0.2 10*3/uL — AB (ref 0–0.1)
EOS ABS: 0.3 10*3/uL (ref 0–0.7)
Eosinophils Relative: 2 %
HCT: 43 % (ref 40.0–52.0)
Hemoglobin: 14.5 g/dL (ref 13.0–18.0)
Lymphocytes Relative: 15 %
Lymphs Abs: 2.3 10*3/uL (ref 1.0–3.6)
MCH: 32.1 pg (ref 26.0–34.0)
MCHC: 33.7 g/dL (ref 32.0–36.0)
MCV: 95.1 fL (ref 80.0–100.0)
MONO ABS: 1 10*3/uL (ref 0.2–1.0)
Neutro Abs: 10.9 10*3/uL — ABNORMAL HIGH (ref 1.4–6.5)
PLATELETS: 281 10*3/uL (ref 150–440)
RBC: 4.52 MIL/uL (ref 4.40–5.90)
RDW: 15.3 % — ABNORMAL HIGH (ref 11.5–14.5)
WBC: 14.6 10*3/uL — ABNORMAL HIGH (ref 3.8–10.6)

## 2015-02-26 LAB — MAGNESIUM: Magnesium: 2.1 mg/dL (ref 1.7–2.4)

## 2015-02-26 MED ORDER — ACETAMINOPHEN 325 MG PO TABS
650.0000 mg | ORAL_TABLET | Freq: Once | ORAL | Status: AC
  Administered 2015-02-26: 650 mg via ORAL
  Filled 2015-02-26: qty 2

## 2015-02-26 MED ORDER — SODIUM CHLORIDE 0.9 % IV SOLN
375.0000 mg/m2 | Freq: Once | INTRAVENOUS | Status: AC
  Administered 2015-02-26: 600 mg via INTRAVENOUS
  Filled 2015-02-26: qty 60

## 2015-02-26 MED ORDER — HEPARIN SOD (PORK) LOCK FLUSH 100 UNIT/ML IV SOLN
500.0000 [IU] | Freq: Once | INTRAVENOUS | Status: DC | PRN
  Filled 2015-02-26: qty 5

## 2015-02-26 MED ORDER — SODIUM CHLORIDE 0.9 % IV SOLN
Freq: Once | INTRAVENOUS | Status: AC
  Administered 2015-02-26: 10:00:00 via INTRAVENOUS
  Filled 2015-02-26: qty 1000

## 2015-02-26 MED ORDER — SODIUM CHLORIDE 0.9 % IV SOLN: 375.0000 mg/m2 | Freq: Once | INTRAVENOUS | Status: DC

## 2015-02-26 MED ORDER — DIPHENHYDRAMINE HCL 50 MG/ML IJ SOLN
6.2500 mg | Freq: Once | INTRAMUSCULAR | Status: AC
  Administered 2015-02-26: 6.5 mg via INTRAVENOUS
  Filled 2015-02-26: qty 1

## 2015-02-26 NOTE — Progress Notes (Signed)
Patient states he finished Levaquin prescription this morning.  C/o lingering cough and congestion for past 6-8 weeks.

## 2015-02-26 NOTE — Progress Notes (Signed)
[place] @ [facility] [phone]  Fax:([phone]     [name] [name] OB: June 22, 1929  MR#: [id number]  QBV#:[id number]  Patient Care Team: Juline Patch, MD as PCP - General (Family Medicine) Kathrynn Ducking, MD (Neurology) Forest Gleason, MD as Consulting Physician (Unknown Physician Specialty) Carlena Bjornstad, MD as Consulting Physician (Cardiology) Seeplaputhur Robinette Haines, MD (General Surgery)  CHIEF COMPLAINT:  Chief Complaint  Patient presents with  . OTHER   Oncology History   Chief Complaint/Problem List  1.  Autoimmune hemolytic anemia. Previously received Prednisone taper.   2.  Splenectomy in 5/02.  3.  Gastric ulcer diagnosed on endoscopy and multiple biopsies are pending, November 2005. 4.  Chronic lymphocytic leukemia. 5.vitamin B 12 deficiency 6.  Recent admission in the hospital in June of 2016 with hemolytic anemia and hemoglobin of 5.5 patient received blood transfusion was started on steroid 7.  Patient was started on rituximab (October 20, 2014        Oncology Flowsheet 11/23/2014 11/24/2014 11/25/2014 11/26/2014 11/27/2014 11/28/2014 01/01/2015  cyanocobalamin ((VITAMIN B-12)) IM - - - - - - -  enoxaparin (LOVENOX) [place] 40 mg 40 mg 40 mg - 40 mg 40 mg -  methylPREDNISolone sodium succinate 40 mg/mL (SOLU-MEDROL) IV - - - - - - -  predniSONE (DELTASONE) PO 10 mg 10 mg 10 mg 10 mg 10 mg 10 mg -  riTUXimab (RITUXAN) IV - - - - - - 375 mg/m2  zolendronic acid (ZOMETA) IV - - - - - - -    INTERVAL HISTORY:   81 year old gentleman came today for further follow-up regarding cellulitis left thigh and groin area.  Is and is chronic lymphocytic leukemia with hemolytic anemia Prednisone 10 mg daily at present time Feeling slightly stronger.  Appetite is gradually improving.  Left groin pain is getting better Patient is here for ongoing evaluation and continuation of Rituxan therapy.  Patient continues to have congestion in the lung and is going to have a CT scan  of the chest ordered by pulmonologists.  Treatment should be reviewed.  Swelling in the left groin area has improved.  Hemoglobin is 14.4.  REVIEW OF SYSTEMS:    general status: Patient is feeling weak and tired.  No change in a performance status.  No chills.  No fever. HEENT: No evidence of stomatitis Lungs as per history of present illness Cardiac: No chest pain or paroxysmal nocturnal dyspnea GI: No nausea no vomiting no diarrhea no abdominal pain Skin: No rash Lower extremity no swelling Neurological system: No tingling.  No numbness.  No other focal signs Musculoskeletal system no bony pains Left groin: Redness has improved swelling is getting better All other systems have been reviewed As per HPI. Otherwise, a complete review of systems is negatve.  PAST MEDICAL HISTORY: Past Medical History  Diagnosis Date  . CLL (chronic lymphoblastic leukemia) 2006    Rituxan treatment 2006  . Atrial septal aneurysm 2009    Insignificant, no, January, 2009  . Infection of PEG site ([place])     cellulitis.Marland KitchenResolved  . Vertebral compression fracture (HCC)     multiple levels  . Herpes zoster     right chest  . Aortic insufficiency 2009    mild, Mild, echo, 2012  . Diastolic dysfunction     mild  . Chest pain     EF 55%, echo, October, 2009, possible inferior and posterior borderline hypokinesis, patient has never  had cardiac catheterization  . Edema  EF 55%, echo, October, 2009, possible borderline inferior and posterior hypokinesis... heart catheterization has never been done(July 10, 2009)  . Myositis     Caused pulmonary insufficiency with muscle weakness in the past / Dr.Willis-neurology, Imuran and prednisone  . Calculus of kidney   . Inguinal hernia without mention of obstruction or gangrene, unilateral or unspecified, (not specified as recurrent)     right   . Unspecified gastritis and gastroduodenitis without mention of hemorrhage   . Abdominal pain, epigastric   .  Nonspecific elevation of levels of transaminase or lactic acid dehydrogenase (LDH)   . Feeding difficulties and mismanagement   . Edema of male genital organs     Resolved, occurred during illness related to myositis  . Swelling of arm     left.Marland KitchenMarland KitchenResolved.... no DVT  . LFT elevation     Related to Imuran in the past... dose was adjusted  . Dyslipidemia     statins not used due to LFT abnormalities  . Shortness of breath     Breathing abnormalities related to muscle weakness from myositis  . RBBB (right bundle branch block)     Old  . Bradycardia     Sinus bradycardia, old  . Ejection fraction     EF 60%, echo, October, 2012  . Mitral regurgitation     Mild / moderate, echo, 2012  . Right ventricular dysfunction     Mild, echo, 2012  . Febrile illness     May, 2013, question sinusitis  . Polymyositis ([place]) 11/26/2012  . Hemolytic anemia ([place])   . Cellulitis     PAST SURGICAL HISTORY: Past Surgical History  Procedure Laterality Date  . Splenectomy    . Hernia repair      left side  . Peg placement  06/2007  . Peg tube removal  12/2007  . Kyphosis surgery      multilevel vertebral  . Odontoid fracture surgery      anterior screw fixation  . Nasal sinus surgery      x2  . Cataract extraction      bi lateral    FAMILY HISTORY Family History  Problem Relation Age of Onset  . Breast cancer Sister   . Colon cancer Brother   . Prostate cancer Brother   . Stroke Mother   . Hypertension Mother   . Emphysema Father   . Diabetes Grandchild   . Heart attack Neg Hx   . Cancer Brother   . Cancer Sister   . Cancer Daughter     ADVANCED DIRECTIVES:  Patient does have advance healthcare directive, Patient   does not desire to make any changes  HEALTH MAINTENANCE: Social History  Substance Use Topics  . Smoking status: Former Smoker -- 0.50 packs/day for 31 years    Types: Cigarettes    Quit date: 06/29/1968  . Smokeless tobacco: Former Systems developer    Types: Chew  .  Alcohol Use: No     Comment: rarely      Allergies  Allergen Reactions  . Sulfonamide Derivatives     Current Outpatient Prescriptions  Medication Sig Dispense Refill  . albuterol (PROVENTIL) (2.5 MG/3ML) 0.083% nebulizer solution Take 3 mLs (2.5 mg total) by nebulization every 6 (six) hours as needed for wheezing or shortness of breath. 75 mL 12  . calcium-vitamin D (CALCIUM 500+D) 500-200 MG-UNIT per tablet Take 1 tablet by mouth 2 (two) times daily.      . Cholecalciferol (VITAMIN D3) 1000 UNITS tablet Take  1,000 Units by mouth daily.      . citalopram (CELEXA) 40 MG tablet Take 1 tablet (40 mg total) by mouth daily. 90 tablet 1  . cyanocobalamin (,VITAMIN B-12,) 1000 MCG/ML injection Inject 1,000 mcg into the muscle every 14 (fourteen) days. At the first of the month    . ferrous sulfate 325 (65 FE) MG tablet Take 325 mg by mouth daily with breakfast.      . fish oil-omega-3 fatty acids 1000 MG capsule Take 1 g by mouth daily at 12 noon.     . folic acid (FOLVITE) 1 MG tablet Take 4 tablets (4 mg total) by mouth daily. 120 tablet 0  . furosemide (LASIX) 80 MG tablet Take 80 mg by mouth.    Marland Kitchen guaiFENesin-codeine 100-10 MG/5ML syrup Take 5 mLs by mouth 3 (three) times daily as needed. 120 mL 0  . HYDROcodone-acetaminophen (NORCO/VICODIN) 5-325 MG per tablet Take 1 tablet by mouth every 6 (six) hours as needed. 120 tablet 0  . levofloxacin (LEVAQUIN) 500 MG tablet Take 1 tablet (500 mg total) by mouth daily. 7 tablet 0  . montelukast (SINGULAIR) 10 MG tablet Take 10 mg by mouth at bedtime.   11  . Multiple Vitamin (MULTIVITAMIN) capsule Take 1 capsule by mouth daily.      . mycophenolate (CELLCEPT) 500 MG tablet Take 1 tablet (500 mg total) by mouth daily. 90 tablet 3  . nystatin cream (MYCOSTATIN) APPLY TOPICALLY TWICE DAILY 30 g 0  . pantoprazole (PROTONIX) 40 MG tablet Take 1 tablet (40 mg total) by mouth daily at 12 noon. 90 tablet 1  . potassium chloride SA (K-DUR,KLOR-CON) 20 MEQ  tablet Take 20 mEq by mouth daily. Mon and Fri he takes BID  2  . predniSONE (DELTASONE) 20 MG tablet Take 0.5 tablet by mouth 2 times a week 90 tablet 0  . PROAIR HFA 108 (90 BASE) MCG/ACT inhaler Inhale 2 puffs into the lungs 4 (four) times daily.   11  . Resource Beneprotein PACK Take 3g three times a day    . temazepam (RESTORIL) 30 MG capsule Take 1 capsule (30 mg total) by mouth at bedtime. 30 capsule 5   No current facility-administered medications for this visit.    OBJECTIVE:  Filed Vitals:   02/26/15 0849  BP: 134/81  Pulse: 84  Temp: 96.6 F (35.9 C)     Body mass index is 21.76 kg/(m^2).    ECOG FS:1 - Symptomatic but completely ambulatory  PHYSICAL EXAM: Gen. status: Patient is feeling somewhat weak and tired.  Somewhat anxious. Lymphatic system: Supraclavicular, cervical, axillary, inguinal lymph nodes are not palpable Head exam was generally normal. There was no scleral icterus or corneal arcus. Mucous membranes were moist. Cardiac: Tachycardia Lungs: Kyphoscoliosis.  Diminished air entry on both sides.  Coarse crepitation at both bases. Abdominal exam revealed normal bowel sounds. The abdomen was soft, non-tender, and without masses, organomegaly, or appreciable enlargement of the abdominal aorta. Examination of the skin revealed no evidence of significant rashes, suspicious appearing nevi or other concerning lesions. Neurologically, the patient was awake, alert, and oriented to person, place and time. There were no obvious focal neurologic abnormalities. Redness and tenderness in the left thigh has improved.  Mild induration persist surgical evaluation has been reviewed.  No further surgical intervention has been planned  Kyphoscoliosis: Osteoporosis and multiple vertebral compression fracture  Skin: Grade 1 rash LAB RESULTS:  Appointment on 02/26/2015  Component Date Value Ref Range Status  .  WBC 02/26/2015 14.6* 3.8 - 10.6 K/uL Final  . RBC 02/26/2015 4.52   4.40 - 5.90 MIL/uL Final  . Hemoglobin 02/26/2015 14.5  13.0 - 18.0 g/dL Final  . HCT 02/26/2015 43.0  40.0 - 52.0 % Final  . MCV 02/26/2015 95.1  80.0 - 100.0 fL Final  . MCH 02/26/2015 32.1  26.0 - 34.0 pg Final  . MCHC 02/26/2015 33.7  32.0 - 36.0 g/dL Final  . RDW 02/26/2015 15.3* 11.5 - 14.5 % Final  . Platelets 02/26/2015 281  150 - 440 K/uL Final  . Neutrophils Relative % 02/26/2015 75%   Final  . Neutro Abs 02/26/2015 10.9* 1.4 - 6.5 K/uL Final  . Lymphocytes Relative 02/26/2015 15%   Final  . Lymphs Abs 02/26/2015 2.3  1.0 - 3.6 K/uL Final  . Monocytes Relative 02/26/2015 7%   Final  . Monocytes Absolute 02/26/2015 1.0  0.2 - 1.0 K/uL Final  . Eosinophils Relative 02/26/2015 2%   Final  . Eosinophils Absolute 02/26/2015 0.3  0 - 0.7 K/uL Final  . Basophils Relative 02/26/2015 1%   Final  . Basophils Absolute 02/26/2015 0.2* 0 - 0.1 K/uL Final  . Sodium 02/26/2015 138  135 - 145 mmol/L Final  . Potassium 02/26/2015 4.2  3.5 - 5.1 mmol/L Final  . Chloride 02/26/2015 105  101 - 111 mmol/L Final  . CO2 02/26/2015 30  22 - 32 mmol/L Final  . Glucose, Bld 02/26/2015 94  65 - 99 mg/dL Final  . BUN 02/26/2015 18  6 - 20 mg/dL Final  . Creatinine, Ser 02/26/2015 0.74  0.61 - 1.24 mg/dL Final  . Calcium 02/26/2015 8.9  8.9 - 10.3 mg/dL Final  . Total Protein 02/26/2015 6.2* 6.5 - 8.1 g/dL Final  . Albumin 02/26/2015 3.6  3.5 - 5.0 g/dL Final  . AST 02/26/2015 30  15 - 41 U/L Final  . ALT 02/26/2015 16* 17 - 63 U/L Final  . Alkaline Phosphatase 02/26/2015 71  38 - 126 U/L Final  . Total Bilirubin 02/26/2015 1.2  0.3 - 1.2 mg/dL Final  . GFR calc non Af Amer 02/26/2015 >60  >60 mL/min Final  . GFR calc Af Amer 02/26/2015 >60  >60 mL/min Final   Comment: (NOTE) The eGFR has been calculated using the CKD EPI equation. This calculation has not been validated in all clinical situations. eGFR's persistently <60 mL/min signify possible Chronic Kidney Disease.   . Anion gap 02/26/2015  3* 5 - 15 Final  . Magnesium 02/26/2015 2.1  1.7 - 2.4 mg/dL Final      STUDIES: Chest CT scan will be reviewed today.  We will continue rituximab.  We will decrease prednisone to 3 days a week.  Later on within a month to be cut down to 5 mg 3 days a week. ASSESSMENT:/MEDICAL DECISION MAKING: Chronic lymphocytic leukemia on Rituxan Autoimmune hemolytic anemia at present time hemoglobin is improved to 14.1.  Prednisone will be decreased to 4 days a week (10 mg dose:) Cellulitis of the left thigh patient is off antibiotics mild redness persistent swelling  Review CT scan of the chest as soon as is done today    No matching staging information was found for the patient.  Forest Gleason, MD   02/26/2015 9:06 AM

## 2015-03-17 ENCOUNTER — Encounter (INDEPENDENT_AMBULATORY_CARE_PROVIDER_SITE_OTHER): Payer: Self-pay

## 2015-03-17 ENCOUNTER — Encounter: Payer: Self-pay | Admitting: Cardiovascular Disease

## 2015-03-17 ENCOUNTER — Ambulatory Visit (INDEPENDENT_AMBULATORY_CARE_PROVIDER_SITE_OTHER): Payer: Medicare Other | Admitting: Cardiovascular Disease

## 2015-03-17 ENCOUNTER — Other Ambulatory Visit: Payer: Self-pay

## 2015-03-17 VITALS — BP 130/58 | HR 52 | Ht 65.0 in | Wt 132.8 lb

## 2015-03-17 DIAGNOSIS — R0602 Shortness of breath: Secondary | ICD-10-CM

## 2015-03-17 DIAGNOSIS — E785 Hyperlipidemia, unspecified: Secondary | ICD-10-CM

## 2015-03-17 DIAGNOSIS — I25111 Atherosclerotic heart disease of native coronary artery with angina pectoris with documented spasm: Secondary | ICD-10-CM | POA: Diagnosis not present

## 2015-03-17 DIAGNOSIS — I5032 Chronic diastolic (congestive) heart failure: Secondary | ICD-10-CM | POA: Diagnosis not present

## 2015-03-17 DIAGNOSIS — I700 Atherosclerosis of aorta: Secondary | ICD-10-CM

## 2015-03-17 DIAGNOSIS — I509 Heart failure, unspecified: Secondary | ICD-10-CM

## 2015-03-17 DIAGNOSIS — I351 Nonrheumatic aortic (valve) insufficiency: Secondary | ICD-10-CM

## 2015-03-17 DIAGNOSIS — I50810 Right heart failure, unspecified: Secondary | ICD-10-CM

## 2015-03-17 DIAGNOSIS — I2510 Atherosclerotic heart disease of native coronary artery without angina pectoris: Secondary | ICD-10-CM | POA: Insufficient documentation

## 2015-03-17 MED ORDER — POTASSIUM CHLORIDE CRYS ER 20 MEQ PO TBCR: 20.0000 meq | EXTENDED_RELEASE_TABLET | Freq: Two times a day (BID) | ORAL | Status: AC

## 2015-03-17 MED ORDER — FUROSEMIDE 80 MG PO TABS: 80.0000 mg | ORAL_TABLET | Freq: Every day | ORAL | Status: AC

## 2015-03-17 NOTE — Assessment & Plan Note (Signed)
Most recent lipid panel has been requested from Dr. Ronnald Ramp. He reports he has follow-up in January 2017 for repeat lab work. Given his significant coronary artery disease, aortic atherosclerosis, ideally LDL should be less than 70. Do not want to cause significant myalgias on a statin. Would have to deliberate with his medical team

## 2015-03-17 NOTE — Progress Notes (Signed)
[name], male    DOB: Oct 13, 1929, 81 y.o.   MRN: [id number]  HPI Comments: [name] [name] is a 81 y.o. male with prior history of smoking, chronic shortness of breath, fatigue, polymyositis that is under good control on CellCept and prednisone, history of CLL, followed by hematology in [place], presenting to establish care in the [place] office  He reports that in general he has been stable, does have shortness of breath on exertion Unclear if this is getting worse.  Takes Lasix 80 mg daily with potassium 20 mEq twice a day On this regimen, BMP stable, normal renal function, potassium greater than 4 Recent CBC showing hematocrit greater than 40  CT scan of the chest recently ordered by Dr. Raul Del showing bronchiectasis, coronary artery disease, atherosclerosis Review of CT scan images with him shows at least moderate coronary artery disease of all 3 vessels, notably in the proximal LAD, moderate aortic atherosclerosis particular through the aortic arch  EKG on today's visit shows sinus bradycardia with rate 52 bpm, right bundle branch block  Prior echocardiogram reviewed with him showing normal ejection fraction, mild valvular disease, mildly elevated right ventricular systolic pressures   Allergies  Allergen Reactions  . Sulfonamide Derivatives     Current Outpatient Prescriptions on File Prior to Visit  Medication Sig Dispense Refill  . albuterol (PROVENTIL) (2.5 MG/3ML) 0.083% nebulizer solution Take 3 mLs (2.5 mg total) by nebulization every 6 (six) hours as needed for wheezing or shortness of breath. 75 mL 12  . calcium-vitamin D (CALCIUM 500+D) 500-200 MG-UNIT per tablet Take 1 tablet by mouth 2 (two) times daily.      . Cholecalciferol (VITAMIN D3) 1000 UNITS tablet Take 1,000 Units by mouth daily.      . cyanocobalamin (,VITAMIN B-12,) 1000 MCG/ML injection Inject 1,000 mcg into the muscle every 14 (fourteen) days. At the first of the month    . ferrous  sulfate 325 (65 FE) MG tablet Take 325 mg by mouth daily with breakfast.      . fish oil-omega-3 fatty acids 1000 MG capsule Take 1 g by mouth daily at 12 noon.     . folic acid (FOLVITE) 1 MG tablet Take 4 tablets (4 mg total) by mouth daily. 120 tablet 0  . guaiFENesin-codeine 100-10 MG/5ML syrup Take 5 mLs by mouth 3 (three) times daily as needed. 120 mL 0  . HYDROcodone-acetaminophen (NORCO/VICODIN) 5-325 MG per tablet Take 1 tablet by mouth every 6 (six) hours as needed. 120 tablet 0  . montelukast (SINGULAIR) 10 MG tablet Take 10 mg by mouth at bedtime.   11  . Multiple Vitamin (MULTIVITAMIN) capsule Take 1 capsule by mouth daily.      . mycophenolate (CELLCEPT) 500 MG tablet Take 1 tablet (500 mg total) by mouth daily. 90 tablet 3  . nystatin cream (MYCOSTATIN) APPLY TOPICALLY TWICE DAILY 30 g 0  . pantoprazole (PROTONIX) 40 MG tablet Take 1 tablet (40 mg total) by mouth daily at 12 noon. 90 tablet 1  . predniSONE (DELTASONE) 20 MG tablet Take 0.5 tablet by mouth 2 times a week 90 tablet 0  . PROAIR HFA 108 (90 BASE) MCG/ACT inhaler Inhale 2 puffs into the lungs 4 (four) times daily.   11  . Resource Beneprotein PACK Take 3g three times a day    . temazepam (RESTORIL) 30 MG capsule Take 1 capsule (30 mg total) by mouth at bedtime. 30 capsule 5   No current facility-administered medications on file  prior to visit.    Past Medical History  Diagnosis Date  . CLL (chronic lymphoblastic leukemia) 2006    Rituxan treatment 2006  . Atrial septal aneurysm 2009    Insignificant, no, January, 2009  . Infection of PEG site ([place])     cellulitis.Marland KitchenResolved  . Vertebral compression fracture (HCC)     multiple levels  . Herpes zoster     right chest  . Aortic insufficiency 2009    mild, Mild, echo, 2012  . Diastolic dysfunction     mild  . Chest pain     EF 55%, echo, October, 2009, possible inferior and posterior borderline hypokinesis, patient has never  had cardiac catheterization  .  Edema     EF 55%, echo, October, 2009, possible borderline inferior and posterior hypokinesis... heart catheterization has never been done(July 10, 2009)  . Myositis     Caused pulmonary insufficiency with muscle weakness in the past / Dr.Willis-neurology, Imuran and prednisone  . Calculus of kidney   . Inguinal hernia without mention of obstruction or gangrene, unilateral or unspecified, (not specified as recurrent)     right   . Unspecified gastritis and gastroduodenitis without mention of hemorrhage   . Abdominal pain, epigastric   . Nonspecific elevation of levels of transaminase or lactic acid dehydrogenase (LDH)   . Feeding difficulties and mismanagement   . Edema of male genital organs     Resolved, occurred during illness related to myositis  . Swelling of arm     left.Marland KitchenMarland KitchenResolved.... no DVT  . LFT elevation     Related to Imuran in the past... dose was adjusted  . Dyslipidemia     statins not used due to LFT abnormalities  . Shortness of breath     Breathing abnormalities related to muscle weakness from myositis  . RBBB (right bundle branch block)     Old  . Bradycardia     Sinus bradycardia, old  . Ejection fraction     EF 60%, echo, October, 2012  . Mitral regurgitation     Mild / moderate, echo, 2012  . Right ventricular dysfunction     Mild, echo, 2012  . Febrile illness     May, 2013, question sinusitis  . Polymyositis ([place]) 11/26/2012  . Hemolytic anemia ([place])   . Cellulitis     Past Surgical History  Procedure Laterality Date  . Splenectomy    . Hernia repair      left side  . Peg placement  06/2007  . Peg tube removal  12/2007  . Kyphosis surgery      multilevel vertebral  . Odontoid fracture surgery      anterior screw fixation  . Nasal sinus surgery      x2  . Cataract extraction      bi lateral    Social History  reports that he quit smoking about 46 years ago. His smoking use included Cigarettes. He has a 15.5 pack-year smoking history. He has  quit using smokeless tobacco. His smokeless tobacco use included Chew. He reports that he does not drink alcohol or use illicit drugs.  Family History family history includes Breast cancer in his sister; Cancer in his brother, daughter, and sister; Colon cancer in his brother; Diabetes in his grandchild; Emphysema in his father; Hypertension in his mother; Prostate cancer in his brother; Stroke in his mother. There is no history of Heart attack.   Review of Systems  Constitutional: Negative.   Respiratory: Positive for  shortness of breath.   Cardiovascular: Negative.   Gastrointestinal: Negative.   Musculoskeletal: Negative.   Neurological: Negative.   Hematological: Negative.   Psychiatric/Behavioral: Negative.   All other systems reviewed and are negative.   BP 130/58 mmHg  Pulse 52  Ht 5\' 5"  (1.651 m)  Wt 132 lb 12 oz (60.215 kg)  BMI 22.09 kg/m2  Physical Exam  Constitutional: He is oriented to person, place, and time. He appears well-developed and well-nourished.  HENT:  Head: Normocephalic.  Nose: Nose normal.  Mouth/Throat: Oropharynx is clear and moist.  Eyes: Conjunctivae are normal. Pupils are equal, round, and reactive to light.  Neck: Normal range of motion. Neck supple. No JVD present.  Cardiovascular: Normal rate, regular rhythm, normal heart sounds and intact distal pulses.  Exam reveals no gallop and no friction rub.   No murmur heard. Pulmonary/Chest: Effort normal and breath sounds normal. No respiratory distress. He has no wheezes. He has no rales. He exhibits no tenderness.  Abdominal: Soft. Bowel sounds are normal. He exhibits no distension. There is no tenderness.  Musculoskeletal: Normal range of motion. He exhibits no edema or tenderness.  Lymphadenopathy:    He has no cervical adenopathy.  Neurological: He is alert and oriented to person, place, and time. Coordination normal.  Skin: Skin is warm and dry. No rash noted. No erythema.  Psychiatric: He  has a normal mood and affect. His behavior is normal. Judgment and thought content normal.

## 2015-03-17 NOTE — Assessment & Plan Note (Signed)
Significant/moderate aortic atherosclerosis seen, large burden seen in the aortic arch, mild in the  ascending and descending aorta. Ideally should pursue aggressive lipid management

## 2015-03-17 NOTE — Patient Instructions (Addendum)
You are doing well.  You have coronary disease on your CT scan  We will schedule a pharmacological stress test at [facility] We will call you with the results  Consider starting aspirin 81 mg daily  Please call us if you have new issues that need to be addressed before your next appt.  Your physician wants you to follow-up in: 6 months.  You will receive a reminder letter in the mail two months in advance. If you don't receive a letter, please call our office to schedule the follow-up appointment.   [place]  Your caregiver has ordered a Stress Test with nuclear imaging. The purpose of this test is to evaluate the blood supply to your heart muscle. This procedure is referred to as a "Non-Invasive Stress Test." This is because other than having an IV started in your vein, nothing is inserted or "invades" your body. Cardiac stress tests are done to find areas of poor blood flow to the heart by determining the extent of coronary artery disease (CAD). Some patients exercise on a treadmill, which naturally increases the blood flow to your heart, while others who are  unable to walk on a treadmill due to physical limitations have a pharmacologic/chemical stress agent called Lexiscan . This medicine will mimic walking on a treadmill by temporarily increasing your coronary blood flow.   Please note: these test may take anywhere between 2-4 hours to complete  PLEASE REPORT TO [place] AT THE FIRST DESK WILL DIRECT YOU WHERE TO GO  Date of Procedure:_____Tuesday, Dec 13_______  Arrival Time for Procedure:__7:15 am________   PLEASE NOTIFY THE OFFICE AT LEAST 24 HOURS IN ADVANCE IF YOU ARE UNABLE TO KEEP YOUR APPOINTMENT.  [phone]  How to prepare for your Myoview test:   Do not eat or drink after midnight  No caffeine for 24 hours prior to test  No smoking 24 hours prior to test.  Your medication may be taken with water.  If your doctor stopped a  medication because of this test, do not take that medication.  Ladies, please do not wear dresses.  Skirts or pants are appropriate. Please wear a short sleeve shirt.  No perfume, cologne or lotion.  Cardiac Nuclear Scanning A cardiac nuclear scan is used to check your heart for problems, such as the following:  A portion of the heart is not getting enough blood.  Part of the heart muscle has died, which happens with a heart attack.  The heart wall is not working normally.  In this test, a radioactive dye (tracer) is injected into your bloodstream. After the tracer has traveled to your heart, a scanning device is used to measure how much of the tracer is absorbed by or distributed to various areas of your heart. LET [facility] CARE PROVIDER KNOW ABOUT:  Any allergies you have.  All medicines you are taking, including vitamins, herbs, eye drops, creams, and over-the-counter medicines.  Previous problems you or members of your family have had with the use of anesthetics.  Any blood disorders you have.  Previous surgeries you have had.  Medical conditions you have.  RISKS AND COMPLICATIONS Generally, this is a safe procedure. However, as with any procedure, problems can occur. Possible problems include:   Serious chest pain.  Rapid heartbeat.  Sensation of warmth in your chest. This usually passes quickly. BEFORE THE PROCEDURE Ask your health care provider about changing or stopping your regular medicines. PROCEDURE This procedure is usually  done at a hospital and takes 2-4 hours.  An IV tube is inserted into one of your veins.  Your health care provider will inject a small amount of radioactive tracer through the tube.  You will then wait for 20-40 minutes while the tracer travels through your bloodstream.  You will lie down on an exam table so images of your heart can be taken. Images will be taken for about 15-20 minutes.  You will exercise on a treadmill or  stationary bike. While you exercise, your heart activity will be monitored with an electrocardiogram (ECG), and your blood pressure will be checked.  If you are unable to exercise, you may be given a medicine to make your heart beat faster.  When blood flow to your heart has peaked, tracer will again be injected through the IV tube.  After 20-40 minutes, you will get back on the exam table and have more images taken of your heart.  When the procedure is over, your IV tube will be removed. AFTER THE PROCEDURE  You will likely be able to leave shortly after the test. Unless your health care provider tells you otherwise, you may return to your normal schedule, including diet, activities, and medicines.  Make sure you find out how and when you will get your test results.   This information is not intended to replace advice given to you by your health care provider. Make sure you discuss any questions you have with your health care provider.   Document Released: 04/22/2004 Document Revised: 04/02/2013 Document Reviewed: 03/06/2013 Elsevier Interactive Patient Education [employer].

## 2015-03-17 NOTE — Assessment & Plan Note (Signed)
Normal left and right ventricular systolic function, Requiring high dose Lasix with potassium daily for leg edema, currently appears euvolemic, normal renal function No changes made to his medications

## 2015-03-17 NOTE — Assessment & Plan Note (Signed)
Significant coronary artery disease seen on CT scan. Images reviewed with him. Three-vessel disease, notably in the proximal LAD. Given his shortness of breath, there is concern of ischemia. No recent stress testing to rule out ischemia. Long discussion with him concerning various treatment options including medical management versus stress testing or catheterization. He prefers doing a Engineer, structural. Unable to treadmill given his shortness of breath and weakness

## 2015-03-17 NOTE — Assessment & Plan Note (Signed)
He reports stable but continued shortness of breath. Significant coronary disease seen on CT scan. Stress test ordered to rule out ischemia

## 2015-03-19 ENCOUNTER — Telehealth: Payer: Self-pay

## 2015-03-19 NOTE — Telephone Encounter (Signed)
Procedure cancelled.

## 2015-03-19 NOTE — Telephone Encounter (Signed)
Pt would like to cancel Stress test for 12/13. Pt states he cannot get there at the arrival time. Pt is not sure if he wants to r/s, for right now he just wants to cx, not r/s

## 2015-03-26 ENCOUNTER — Inpatient Hospital Stay (HOSPITAL_BASED_OUTPATIENT_CLINIC_OR_DEPARTMENT_OTHER): Payer: Medicare Other | Admitting: Oncology

## 2015-03-26 ENCOUNTER — Encounter: Payer: Self-pay | Admitting: Oncology

## 2015-03-26 ENCOUNTER — Inpatient Hospital Stay: Payer: Medicare Other | Attending: Oncology

## 2015-03-26 VITALS — BP 118/63 | HR 65 | Temp 96.8°F | Resp 18 | Wt 131.8 lb

## 2015-03-26 DIAGNOSIS — Z8711 Personal history of peptic ulcer disease: Secondary | ICD-10-CM

## 2015-03-26 DIAGNOSIS — Z8781 Personal history of (healed) traumatic fracture: Secondary | ICD-10-CM

## 2015-03-26 DIAGNOSIS — C9110 Chronic lymphocytic leukemia of B-cell type not having achieved remission: Secondary | ICD-10-CM | POA: Diagnosis not present

## 2015-03-26 DIAGNOSIS — R5383 Other fatigue: Secondary | ICD-10-CM | POA: Diagnosis not present

## 2015-03-26 DIAGNOSIS — Z800 Family history of malignant neoplasm of digestive organs: Secondary | ICD-10-CM

## 2015-03-26 DIAGNOSIS — R531 Weakness: Secondary | ICD-10-CM | POA: Diagnosis not present

## 2015-03-26 DIAGNOSIS — R05 Cough: Secondary | ICD-10-CM | POA: Diagnosis not present

## 2015-03-26 DIAGNOSIS — Z862 Personal history of diseases of the blood and blood-forming organs and certain disorders involving the immune mechanism: Secondary | ICD-10-CM

## 2015-03-26 DIAGNOSIS — Z9081 Acquired absence of spleen: Secondary | ICD-10-CM | POA: Diagnosis not present

## 2015-03-26 DIAGNOSIS — Z87891 Personal history of nicotine dependence: Secondary | ICD-10-CM

## 2015-03-26 DIAGNOSIS — K4090 Unilateral inguinal hernia, without obstruction or gangrene, not specified as recurrent: Secondary | ICD-10-CM | POA: Insufficient documentation

## 2015-03-26 DIAGNOSIS — J189 Pneumonia, unspecified organism: Secondary | ICD-10-CM | POA: Diagnosis not present

## 2015-03-26 DIAGNOSIS — I5032 Chronic diastolic (congestive) heart failure: Secondary | ICD-10-CM | POA: Diagnosis not present

## 2015-03-26 DIAGNOSIS — Z87442 Personal history of urinary calculi: Secondary | ICD-10-CM | POA: Diagnosis not present

## 2015-03-26 DIAGNOSIS — Z8719 Personal history of other diseases of the digestive system: Secondary | ICD-10-CM

## 2015-03-26 DIAGNOSIS — E538 Deficiency of other specified B group vitamins: Secondary | ICD-10-CM | POA: Insufficient documentation

## 2015-03-26 DIAGNOSIS — L03116 Cellulitis of left lower limb: Secondary | ICD-10-CM | POA: Insufficient documentation

## 2015-03-26 DIAGNOSIS — I351 Nonrheumatic aortic (valve) insufficiency: Secondary | ICD-10-CM | POA: Diagnosis not present

## 2015-03-26 DIAGNOSIS — D591 Other autoimmune hemolytic anemias: Secondary | ICD-10-CM | POA: Insufficient documentation

## 2015-03-26 DIAGNOSIS — Z8042 Family history of malignant neoplasm of prostate: Secondary | ICD-10-CM | POA: Insufficient documentation

## 2015-03-26 DIAGNOSIS — B029 Zoster without complications: Secondary | ICD-10-CM | POA: Diagnosis not present

## 2015-03-26 DIAGNOSIS — J479 Bronchiectasis, uncomplicated: Secondary | ICD-10-CM

## 2015-03-26 DIAGNOSIS — Z8679 Personal history of other diseases of the circulatory system: Secondary | ICD-10-CM

## 2015-03-26 DIAGNOSIS — Z79899 Other long term (current) drug therapy: Secondary | ICD-10-CM | POA: Diagnosis not present

## 2015-03-26 LAB — CBC WITH DIFFERENTIAL/PLATELET
BASOS ABS: 0.2 10*3/uL — AB (ref 0–0.1)
BASOS PCT: 1 %
EOS ABS: 0.3 10*3/uL (ref 0–0.7)
EOS PCT: 2 %
HCT: 39 % — ABNORMAL LOW (ref 40.0–52.0)
HEMOGLOBIN: 13 g/dL (ref 13.0–18.0)
Lymphocytes Relative: 44 %
Lymphs Abs: 6.1 10*3/uL — ABNORMAL HIGH (ref 1.0–3.6)
MCH: 32.3 pg (ref 26.0–34.0)
MCHC: 33.3 g/dL (ref 32.0–36.0)
MCV: 97 fL (ref 80.0–100.0)
Monocytes Absolute: 2 10*3/uL — ABNORMAL HIGH (ref 0.2–1.0)
Monocytes Relative: 15 %
NEUTROS PCT: 38 %
Neutro Abs: 5.3 10*3/uL (ref 1.4–6.5)
PLATELETS: 346 10*3/uL (ref 150–440)
RBC: 4.02 MIL/uL — AB (ref 4.40–5.90)
RDW: 14.5 % (ref 11.5–14.5)
WBC: 13.8 10*3/uL — AB (ref 3.8–10.6)

## 2015-03-26 MED ORDER — AMOXICILLIN 500 MG PO TABS: 500.0000 mg | ORAL_TABLET | Freq: Three times a day (TID) | ORAL | Status: DC

## 2015-03-27 ENCOUNTER — Encounter: Payer: Self-pay | Admitting: Oncology

## 2015-03-27 NOTE — Progress Notes (Signed)
[place] @ [facility] Telephone:([phone]) [phone]  [phone]     Mclane Theodorou OB: July 28, 1929  MR#: [id number]  [id number]  Patient Care Team: Juline Patch, MD as PCP - General (Family Medicine) Kathrynn Ducking, MD (Neurology) Forest Gleason, MD as Consulting Physician (Unknown Physician Specialty) Carlena Bjornstad, MD as Consulting Physician (Cardiology) Seeplaputhur Robinette Haines, MD (General Surgery)  CHIEF COMPLAINT:  Chief Complaint  Patient presents with  . Leukemia   Oncology History   Chief Complaint/Problem List  1.  Autoimmune hemolytic anemia. Previously received Prednisone taper.   2.  Splenectomy in 5/02.  3.  Gastric ulcer diagnosed on endoscopy and multiple biopsies are pending, November 2005. 4.  Chronic lymphocytic leukemia. 5.vitamin B 12 deficiency 6.  Recent admission in the hospital in June of 2016 with hemolytic anemia and hemoglobin of 5.5 patient received blood transfusion was started on steroid 7.  Patient was started on rituximab (October 20, 2014        Oncology Flowsheet 11/24/2014 11/25/2014 11/26/2014 11/27/2014 11/28/2014 01/01/2015 02/26/2015  cyanocobalamin ((VITAMIN B-12)) IM - - - - - - -  enoxaparin (LOVENOX) [place] 40 mg 40 mg - 40 mg 40 mg - -  methylPREDNISolone sodium succinate 40 mg/mL (SOLU-MEDROL) IV - - - - - - -  predniSONE (DELTASONE) PO 10 mg 10 mg 10 mg 10 mg 10 mg - -  riTUXimab (RITUXAN) IV - - - - - 375 mg/m2 375 mg/m2  zolendronic acid (ZOMETA) IV - - - - - - -    INTERVAL HISTORY:   81 year old gentleman came today for further follow-up regarding cellulitis left thigh and groin area.  Is and is chronic lymphocytic leukemia with hemolytic anemia Patient is on prednisone 10 mg 3 times a week.  Complains of lack of energy.  Cough yellowish expectoration.  Has finished Levaquin few weeks ago.  Cellulitis in the left lower extremity is improved. Here for further follow-up regarding autoimmune hemolytic anemia with chronic  lymphocytic leukemia   REVIEW OF SYSTEMS:    general status: Patient is feeling weak and tired.  No change in a performance status.  No chills.  No fever. HEENT: No evidence of stomatitis Lungs as per history of present illness Cardiac: No chest pain or paroxysmal nocturnal dyspnea GI: No nausea no vomiting no diarrhea no abdominal pain Skin: No rash Lower extremity no swelling Neurological system: No tingling.  No numbness.  No other focal signs Musculoskeletal system no bony pains Left groin: Redness has improved swelling is getting better All other systems have been reviewed As per HPI. Otherwise, a complete review of systems is negatve.  PAST MEDICAL HISTORY: Past Medical History  Diagnosis Date  . CLL (chronic lymphoblastic leukemia) 2006    Rituxan treatment 2006  . Atrial septal aneurysm 2009    Insignificant, no, January, 2009  . Infection of PEG site ([place])     cellulitis.Marland KitchenResolved  . Vertebral compression fracture (H[facility])     multiple levels  . Herpes zoster     right chest  . Aortic insufficiency 2009    mild, Mild, echo, 2012  . Diastolic dysfunction     mild  . Chest pain     EF 55%, echo, October, 2009, possible inferior and posterior borderline hypokinesis, patient has never  had cardiac catheterization  . Edema     EF 55%, echo, October, 2009, possible borderline inferior and posterior hypokinesis... heart catheterization has never been done(July 10, 2009)  . Myositis  Caused pulmonary insufficiency with muscle weakness in the past / Dr.Willis-neurology, Imuran and prednisone  . Calculus of kidney   . Inguinal hernia without mention of obstruction or gangrene, unilateral or unspecified, (not specified as recurrent)     right   . Unspecified gastritis and gastroduodenitis without mention of hemorrhage   . Abdominal pain, epigastric   . Nonspecific elevation of levels of transaminase or lactic acid dehydrogenase (LDH)   . Feeding difficulties and  mismanagement   . Edema of male genital organs     Resolved, occurred during illness related to myositis  . Swelling of arm     left.Marland KitchenMarland KitchenResolved.... no DVT  . LFT elevation     Related to Imuran in the past... dose was adjusted  . Dyslipidemia     statins not used due to LFT abnormalities  . Shortness of breath     Breathing abnormalities related to muscle weakness from myositis  . RBBB (right bundle branch block)     Old  . Bradycardia     Sinus bradycardia, old  . Ejection fraction     EF 60%, echo, October, 2012  . Mitral regurgitation     Mild / moderate, echo, 2012  . Right ventricular dysfunction     Mild, echo, 2012  . Febrile illness     May, 2013, question sinusitis  . Polymyositis ([place]) 11/26/2012  . Hemolytic anemia ([place])   . Cellulitis   . Chronic diastolic CHF (congestive heart failure) ([place]) 03/17/2015    PAST SURGICAL HISTORY: Past Surgical History  Procedure Laterality Date  . Splenectomy    . Hernia repair      left side  . Peg placement  06/2007  . Peg tube removal  12/2007  . Kyphosis surgery      multilevel vertebral  . Odontoid fracture surgery      anterior screw fixation  . Nasal sinus surgery      x2  . Cataract extraction      bi lateral    FAMILY HISTORY Family History  Problem Relation Age of Onset  . Breast cancer Sister   . Colon cancer Brother   . Prostate cancer Brother   . Stroke Mother   . Hypertension Mother   . Emphysema Father   . Diabetes Grandchild   . Heart attack Neg Hx   . Cancer Brother   . Cancer Sister   . Cancer Daughter     ADVANCED DIRECTIVES:  Patient does have advance healthcare directive, Patient   does not desire to make any changes  HEALTH MAINTENANCE: Social History  Substance Use Topics  . Smoking status: Former Smoker -- 0.50 packs/day for 31 years    Types: Cigarettes    Quit date: 06/29/1968  . Smokeless tobacco: Former Systems developer    Types: Chew  . Alcohol Use: No     Comment: rarely       Allergies  Allergen Reactions  . Sulfonamide Derivatives     Current Outpatient Prescriptions  Medication Sig Dispense Refill  . albuterol (PROVENTIL) (2.5 MG/3ML) 0.083% nebulizer solution Take 3 mLs (2.5 mg total) by nebulization every 6 (six) hours as needed for wheezing or shortness of breath. 75 mL 12  . calcium-vitamin D (CALCIUM 500+D) 500-200 MG-UNIT per tablet Take 1 tablet by mouth 2 (two) times daily.      . Cholecalciferol (VITAMIN D3) 1000 UNITS tablet Take 1,000 Units by mouth daily.      . cyanocobalamin (,VITAMIN B-12,) 1000 MCG/ML  injection Inject 1,000 mcg into the muscle every 14 (fourteen) days. At the first of the month    . ferrous sulfate 325 (65 FE) MG tablet Take 325 mg by mouth daily with breakfast.      . fish oil-omega-3 fatty acids 1000 MG capsule Take 1 g by mouth daily at 12 noon.     . folic acid (FOLVITE) 1 MG tablet Take 4 tablets (4 mg total) by mouth daily. 120 tablet 0  . furosemide (LASIX) 80 MG tablet Take 1 tablet (80 mg total) by mouth daily. 90 tablet 3  . guaiFENesin-codeine 100-10 MG/5ML syrup Take 5 mLs by mouth 3 (three) times daily as needed. 120 mL 0  . HYDROcodone-acetaminophen (NORCO/VICODIN) 5-325 MG per tablet Take 1 tablet by mouth every 6 (six) hours as needed. 120 tablet 0  . montelukast (SINGULAIR) 10 MG tablet Take 10 mg by mouth at bedtime.   11  . Multiple Vitamin (MULTIVITAMIN) capsule Take 1 capsule by mouth daily.      . mycophenolate (CELLCEPT) 500 MG tablet Take 1 tablet (500 mg total) by mouth daily. 90 tablet 3  . nystatin cream (MYCOSTATIN) APPLY TOPICALLY TWICE DAILY 30 g 0  . pantoprazole (PROTONIX) 40 MG tablet Take 1 tablet (40 mg total) by mouth daily at 12 noon. 90 tablet 1  . potassium chloride SA (K-DUR,KLOR-CON) 20 MEQ tablet Take 1 tablet (20 mEq total) by mouth 2 (two) times daily. 180 tablet 3  . predniSONE (DELTASONE) 20 MG tablet Take 0.5 tablet by mouth 2 times a week 90 tablet 0  . PROAIR HFA 108 (90  BASE) MCG/ACT inhaler Inhale 2 puffs into the lungs 4 (four) times daily.   11  . Resource Beneprotein PACK Take 3g three times a day    . temazepam (RESTORIL) 30 MG capsule Take 1 capsule (30 mg total) by mouth at bedtime. 30 capsule 5  . amoxicillin (AMOXIL) 500 MG tablet Take 1 tablet (500 mg total) by mouth 3 (three) times daily. 21 tablet 0   No current facility-administered medications for this visit.    OBJECTIVE:  Filed Vitals:   03/26/15 1540  BP: 118/63  Pulse: 65  Temp: 96.8 F (36 C)  Resp: 18     Body mass index is 21.94 kg/(m^2).    ECOG FS:1 - Symptomatic but completely ambulatory  PHYSICAL EXAM: Gen. status: Patient is feeling somewhat weak and tired.  Somewhat anxious. Lymphatic system: Supraclavicular, cervical, axillary, inguinal lymph nodes are not palpable Head exam was generally normal. There was no scleral icterus or corneal arcus. Mucous membranes were moist. Cardiac: Tachycardia Lungs: Kyphoscoliosis.  Diminished air entry on both sides.  Coarse crepitation at both bases.Occasional rhonchi  Abdominal exam revealed normal bowel sounds. The abdomen was soft, non-tender, and without masses, organomegaly, or appreciable enlargement of the abdominal aorta. Examination of the skin revealed no evidence of significant rashes, suspicious appearing nevi or other concerning lesions. Cellulitis in the left lower extremity is improved.  Mild redness persists.  Kyphoscoliosis: Osteoporosis and multiple vertebral compression fracture  Skin: Grade 1 rash LAB RESULTS:  Appointment on 03/26/2015  Component Date Value Ref Range Status  . WBC 03/26/2015 13.8* 3.8 - 10.6 K/uL Final  . RBC 03/26/2015 4.02* 4.40 - 5.90 MIL/uL Final  . Hemoglobin 03/26/2015 13.0  13.0 - 18.0 g/dL Final  . HCT 03/26/2015 39.0* 40.0 - 52.0 % Final  . MCV 03/26/2015 97.0  80.0 - 100.0 fL Final  . MCH 03/26/2015 32.3  26.0 - 34.0 pg Final  . MCHC 03/26/2015 33.3  32.0 - 36.0 g/dL Final  . RDW  03/26/2015 14.5  11.5 - 14.5 % Final  . Platelets 03/26/2015 346  150 - 440 K/uL Final  . Neutrophils Relative % 03/26/2015 38   Final  . Neutro Abs 03/26/2015 5.3  1.4 - 6.5 K/uL Final  . Lymphocytes Relative 03/26/2015 44   Final  . Lymphs Abs 03/26/2015 6.1* 1.0 - 3.6 K/uL Final  . Monocytes Relative 03/26/2015 15   Final  . Monocytes Absolute 03/26/2015 2.0* 0.2 - 1.0 K/uL Final  . Eosinophils Relative 03/26/2015 2   Final  . Eosinophils Absolute 03/26/2015 0.3  0 - 0.7 K/uL Final  . Basophils Relative 03/26/2015 1   Final  . Basophils Absolute 03/26/2015 0.2* 0 - 0.1 K/uL Final      STUDIES:  ASSESSMENT:/MEDICAL DECISION MAKING: Chronic lymphocytic leukemia on Rituxan Autoimmune hemolytic anemia at present time hemoglobin is improved to 13.  Prednisone will be decreased to 4 days a week (10 mg dose:) Cellulitis of the left thigh patient is off antibiotics .  Redness persists but no swelling tenderness. Recurrent pulmonary infection.  CT scan done in November of 2016 was bronchiectasis. Patient has been started on amoxicillin 500 mg every 8 hourly for 7 days If there is no improvement to let me know than sputum will be cultured and IV antibiotics will be considered. Patient has been asked to reduce prednisone to 2 times a week       No matching staging information was found for the patient.  Forest Gleason, MD   03/27/2015 8:31 AM

## 2015-04-14 ENCOUNTER — Other Ambulatory Visit: Payer: Self-pay

## 2015-04-15 ENCOUNTER — Encounter: Payer: Self-pay | Admitting: Family Medicine

## 2015-04-15 ENCOUNTER — Ambulatory Visit (INDEPENDENT_AMBULATORY_CARE_PROVIDER_SITE_OTHER): Payer: Medicare Other | Admitting: Family Medicine

## 2015-04-15 VITALS — BP 120/60 | HR 72 | Ht 65.0 in | Wt 136.0 lb

## 2015-04-15 DIAGNOSIS — R1032 Left lower quadrant pain: Secondary | ICD-10-CM

## 2015-04-15 LAB — HEMOCCULT GUIAC POC 1CARD (OFFICE): Fecal Occult Blood, POC: NEGATIVE

## 2015-04-15 MED ORDER — METRONIDAZOLE 500 MG PO TABS: 500.0000 mg | ORAL_TABLET | Freq: Three times a day (TID) | ORAL | Status: DC

## 2015-04-15 MED ORDER — AMOXICILLIN-POT CLAVULANATE 875-125 MG PO TABS: 1.0000 | ORAL_TABLET | Freq: Two times a day (BID) | ORAL | Status: DC

## 2015-04-15 NOTE — Patient Instructions (Signed)
Diverticulitis °Diverticulitis is inflammation or infection of small pouches in your colon that form when you have a condition called diverticulosis. The pouches in your colon are called diverticula. Your colon, or large intestine, is where water is absorbed and stool is formed. °Complications of diverticulitis can include: °· Bleeding. °· Severe infection. °· Severe pain. °· Perforation of your colon. °· Obstruction of your colon. °CAUSES  °Diverticulitis is caused by bacteria. °Diverticulitis happens when stool becomes trapped in diverticula. This allows bacteria to grow in the diverticula, which can lead to inflammation and infection. °RISK FACTORS °People with diverticulosis are at risk for diverticulitis. Eating a diet that does not include enough fiber from fruits and vegetables may make diverticulitis more likely to develop. °SYMPTOMS  °Symptoms of diverticulitis may include: °· Abdominal pain and tenderness. The pain is normally located on the left side of the abdomen, but may occur in other areas. °· Fever and chills. °· Bloating. °· Cramping. °· Nausea. °· Vomiting. °· Constipation. °· Diarrhea. °· Blood in your stool. °DIAGNOSIS  °Your health care provider will ask you about your medical history and do a physical exam. You may need to have tests done because many medical conditions can cause the same symptoms as diverticulitis. Tests may include: °· Blood tests. °· Urine tests. °· Imaging tests of the abdomen, including X-rays and CT scans. °When your condition is under control, your health care provider may recommend that you have a colonoscopy. A colonoscopy can show how severe your diverticula are and whether something else is causing your symptoms. °TREATMENT  °Most cases of diverticulitis are mild and can be treated at home. Treatment may include: °· Taking over-the-counter pain medicines. °· Following a clear liquid diet. °· Taking antibiotic medicines by mouth for 7-10 days. °More severe cases may  be treated at a hospital. Treatment may include: °· Not eating or drinking. °· Taking prescription pain medicine. °· Receiving antibiotic medicines through an IV tube. °· Receiving fluids and nutrition through an IV tube. °· Surgery. °HOME CARE INSTRUCTIONS  °· Follow your health care provider's instructions carefully. °· Follow a full liquid diet or other diet as directed by your health care provider. After your symptoms improve, your health care provider may tell you to change your diet. He or she may recommend you eat a high-fiber diet. Fruits and vegetables are good sources of fiber. Fiber makes it easier to pass stool. °· Take fiber supplements or probiotics as directed by your health care provider. °· Only take medicines as directed by your health care provider. °· Keep all your follow-up appointments. °SEEK MEDICAL CARE IF:  °· Your pain does not improve. °· You have a hard time eating food. °· Your bowel movements do not return to normal. °SEEK IMMEDIATE MEDICAL CARE IF:  °· Your pain becomes worse. °· Your symptoms do not get better. °· Your symptoms suddenly get worse. °· You have a fever. °· You have repeated vomiting. °· You have bloody or black, tarry stools. °MAKE SURE YOU:  °· Understand these instructions. °· Will watch your condition. °· Will get help right away if you are not doing well or get worse. °  °This information is not intended to replace advice given to you by your health care provider. Make sure you discuss any questions you have with your health care provider. °  °Document Released: 01/05/2005 Document Revised: 04/02/2013 Document Reviewed: 02/20/2013 °Elsevier Interactive Patient Education ©2016 [employer] ° °

## 2015-04-15 NOTE — Progress Notes (Signed)
Name: [name] [name]   MRN: [id number]    DOB: 1929/05/14   Date:04/15/2015       Progress Note  Subjective  Chief Complaint  Chief Complaint  Patient presents with  . Abdominal Pain    pain across the front of abdomen x 1 week- no change in bowel habits- last BM this am- normal    Abdominal Pain This is a new problem. The current episode started in the past 7 days. The onset quality is gradual. The problem occurs constantly. The problem has been gradually worsening. The pain is located in the suprapubic region and LLQ. Associated symptoms include flatus. Pertinent negatives include no constipation, diarrhea, dysuria, fever, frequency, headaches, hematochezia, hematuria, melena, myalgias, nausea, vomiting or weight loss. Nothing aggravates the pain. The pain is relieved by being still. There is no history of abdominal surgery, colon cancer, Crohn's disease or ulcerative colitis.    No problem-specific assessment & plan notes found for this encounter.   Past Medical History  Diagnosis Date  . CLL (chronic lymphoblastic leukemia) 2006    Rituxan treatment 2006  . Atrial septal aneurysm 2009    Insignificant, no, January, 2009  . Infection of PEG site ([place])     cellulitis.Marland KitchenResolved  . Vertebral compression fracture (HCC)     multiple levels  . Herpes zoster     right chest  . Aortic insufficiency 2009    mild, Mild, echo, 2012  . Diastolic dysfunction     mild  . Chest pain     EF 55%, echo, October, 2009, possible inferior and posterior borderline hypokinesis, patient has never  had cardiac catheterization  . Edema     EF 55%, echo, October, 2009, possible borderline inferior and posterior hypokinesis... heart catheterization has never been done(July 10, 2009)  . Myositis     Caused pulmonary insufficiency with muscle weakness in the past / Dr.Willis-neurology, Imuran and prednisone  . Calculus of kidney   . Inguinal hernia without mention of obstruction or gangrene, unilateral or  unspecified, (not specified as recurrent)     right   . Unspecified gastritis and gastroduodenitis without mention of hemorrhage   . Abdominal pain, epigastric   . Nonspecific elevation of levels of transaminase or lactic acid dehydrogenase (LDH)   . Feeding difficulties and mismanagement   . Edema of male genital organs     Resolved, occurred during illness related to myositis  . Swelling of arm     left.Marland KitchenMarland KitchenResolved.... no DVT  . LFT elevation     Related to Imuran in the past... dose was adjusted  . Dyslipidemia     statins not used due to LFT abnormalities  . Shortness of breath     Breathing abnormalities related to muscle weakness from myositis  . RBBB (right bundle branch block)     Old  . Bradycardia     Sinus bradycardia, old  . Ejection fraction     EF 60%, echo, October, 2012  . Mitral regurgitation     Mild / moderate, echo, 2012  . Right ventricular dysfunction     Mild, echo, 2012  . Febrile illness     May, 2013, question sinusitis  . Polymyositis ([place]) 11/26/2012  . Hemolytic anemia ([place])   . Cellulitis   . Chronic diastolic CHF (congestive heart failure) ([place]) 03/17/2015    Past Surgical History  Procedure Laterality Date  . Splenectomy    . Hernia repair      left side  . Peg  placement  06/2007  . Peg tube removal  12/2007  . Kyphosis surgery      multilevel vertebral  . Odontoid fracture surgery      anterior screw fixation  . Nasal sinus surgery      x2  . Cataract extraction      bi lateral    Family History  Problem Relation Age of Onset  . Breast cancer Sister   . Colon cancer Brother   . Prostate cancer Brother   . Stroke Mother   . Hypertension Mother   . Emphysema Father   . Diabetes Grandchild   . Heart attack Neg Hx   . Cancer Brother   . Cancer Sister   . Cancer Daughter     Social History   Social History  . Marital Status: Widowed    Spouse Name: N/A  . Number of Children: 4  . Years of Education: college    Occupational History  . retired    Social History Main Topics  . Smoking status: Former Smoker -- 0.50 packs/day for 31 years    Types: Cigarettes    Quit date: 06/29/1968  . Smokeless tobacco: Former Systems developer    Types: Chew  . Alcohol Use: No     Comment: rarely  . Drug Use: No  . Sexual Activity: No   Other Topics Concern  . Not on file   Social History Narrative   Patient is widowed and lives alone.   Patient is retired.   Patient has four adult children.   Patient has a college degree.   Patient is right-handed.   Patient drinks one cup of coffee daily.    Allergies  Allergen Reactions  . Sulfonamide Derivatives      Review of Systems  Constitutional: Negative for fever, chills, weight loss and malaise/fatigue.  HENT: Negative for ear discharge, ear pain and sore throat.   Eyes: Negative for blurred vision.  Respiratory: Negative for cough, sputum production, shortness of breath and wheezing.   Cardiovascular: Negative for chest pain, palpitations and leg swelling.  Gastrointestinal: Positive for abdominal pain and flatus. Negative for heartburn, nausea, vomiting, diarrhea, constipation, blood in stool, melena and hematochezia.  Genitourinary: Negative for dysuria, urgency, frequency and hematuria.  Musculoskeletal: Positive for back pain. Negative for myalgias, joint pain and neck pain.  Skin: Negative for rash.  Neurological: Negative for dizziness, tingling, sensory change, focal weakness and headaches.  Endo/Heme/Allergies: Negative for environmental allergies and polydipsia. Does not bruise/bleed easily.  Psychiatric/Behavioral: Negative for depression and suicidal ideas. The patient is not nervous/anxious and does not have insomnia.      Objective  Filed Vitals:   04/15/15 1044  BP: 120/60  Pulse: 72  Height: 5\' 5"  (1.651 m)  Weight: 136 lb (61.689 kg)    Physical Exam  Constitutional: He is oriented to person, place, and time and well-developed,  well-nourished, and in no distress.  HENT:  Head: Normocephalic.  Right Ear: External ear normal.  Left Ear: External ear normal.  Nose: Nose normal.  Mouth/Throat: Oropharynx is clear and moist.  Eyes: Conjunctivae and EOM are normal. Pupils are equal, round, and reactive to light. Right eye exhibits no discharge. Left eye exhibits no discharge. No scleral icterus.  Neck: Normal range of motion. Neck supple. No JVD present. No tracheal deviation present. No thyromegaly present.  Cardiovascular: Normal rate, regular rhythm, normal heart sounds and intact distal pulses.  Exam reveals no gallop and no friction rub.   No murmur  heard. Pulmonary/Chest: Breath sounds normal. No respiratory distress. He has no wheezes. He has no rales.  Abdominal: Soft. Bowel sounds are normal. He exhibits no mass. There is no hepatosplenomegaly. There is no tenderness. There is no rebound, no guarding and no CVA tenderness.  Genitourinary: Rectum normal. Rectal exam shows no external hemorrhoid, no mass and no tenderness. Prostate is enlarged. Prostate is not tender.  Musculoskeletal: Normal range of motion. He exhibits no edema or tenderness.  Lymphadenopathy:    He has no cervical adenopathy.  Neurological: He is alert and oriented to person, place, and time. He has normal sensation, normal strength, normal reflexes and intact cranial nerves. No cranial nerve deficit.  Skin: Skin is warm. No rash noted.  Psychiatric: Mood and affect normal.      Assessment & Plan  Problem List Items Addressed This Visit    None    Visit Diagnoses    Left lower quadrant pain    -  Primary    Relevant Medications    amoxicillin-clavulanate (AUGMENTIN) 875-125 MG tablet    metroNIDAZOLE (FLAGYL) 500 MG tablet    Other Relevant Orders    POCT Occult Blood Stool (Completed)    PSA         Dr. Delayne Sanzo [facility]  04/15/2015

## 2015-04-16 LAB — PSA: Prostate Specific Ag, Serum: 1.9 ng/mL (ref 0.0–4.0)

## 2015-04-23 ENCOUNTER — Inpatient Hospital Stay: Payer: Medicare Other | Attending: Oncology

## 2015-04-23 ENCOUNTER — Inpatient Hospital Stay: Payer: Medicare Other

## 2015-04-23 ENCOUNTER — Inpatient Hospital Stay (HOSPITAL_BASED_OUTPATIENT_CLINIC_OR_DEPARTMENT_OTHER): Payer: Medicare Other | Admitting: Oncology

## 2015-04-23 ENCOUNTER — Encounter: Payer: Self-pay | Admitting: Oncology

## 2015-04-23 VITALS — BP 104/74 | HR 76 | Temp 96.7°F | Resp 18 | Wt 133.8 lb

## 2015-04-23 DIAGNOSIS — I340 Nonrheumatic mitral (valve) insufficiency: Secondary | ICD-10-CM | POA: Diagnosis not present

## 2015-04-23 DIAGNOSIS — E538 Deficiency of other specified B group vitamins: Secondary | ICD-10-CM | POA: Diagnosis not present

## 2015-04-23 DIAGNOSIS — Z9081 Acquired absence of spleen: Secondary | ICD-10-CM

## 2015-04-23 DIAGNOSIS — R5383 Other fatigue: Secondary | ICD-10-CM

## 2015-04-23 DIAGNOSIS — I771 Stricture of artery: Secondary | ICD-10-CM | POA: Insufficient documentation

## 2015-04-23 DIAGNOSIS — Z87891 Personal history of nicotine dependence: Secondary | ICD-10-CM

## 2015-04-23 DIAGNOSIS — E785 Hyperlipidemia, unspecified: Secondary | ICD-10-CM

## 2015-04-23 DIAGNOSIS — Z87442 Personal history of urinary calculi: Secondary | ICD-10-CM | POA: Diagnosis not present

## 2015-04-23 DIAGNOSIS — R531 Weakness: Secondary | ICD-10-CM | POA: Diagnosis not present

## 2015-04-23 DIAGNOSIS — Z8781 Personal history of (healed) traumatic fracture: Secondary | ICD-10-CM | POA: Insufficient documentation

## 2015-04-23 DIAGNOSIS — Z79899 Other long term (current) drug therapy: Secondary | ICD-10-CM | POA: Insufficient documentation

## 2015-04-23 DIAGNOSIS — D594 Other nonautoimmune hemolytic anemias: Secondary | ICD-10-CM

## 2015-04-23 DIAGNOSIS — M7989 Other specified soft tissue disorders: Secondary | ICD-10-CM | POA: Insufficient documentation

## 2015-04-23 DIAGNOSIS — M609 Myositis, unspecified: Secondary | ICD-10-CM

## 2015-04-23 DIAGNOSIS — D479 Neoplasm of uncertain behavior of lymphoid, hematopoietic and related tissue, unspecified: Secondary | ICD-10-CM

## 2015-04-23 DIAGNOSIS — Z7952 Long term (current) use of systemic steroids: Secondary | ICD-10-CM

## 2015-04-23 DIAGNOSIS — I4510 Unspecified right bundle-branch block: Secondary | ICD-10-CM | POA: Insufficient documentation

## 2015-04-23 DIAGNOSIS — C9110 Chronic lymphocytic leukemia of B-cell type not having achieved remission: Secondary | ICD-10-CM | POA: Diagnosis not present

## 2015-04-23 DIAGNOSIS — D591 Other autoimmune hemolytic anemias: Secondary | ICD-10-CM

## 2015-04-23 DIAGNOSIS — L03116 Cellulitis of left lower limb: Secondary | ICD-10-CM

## 2015-04-23 DIAGNOSIS — R609 Edema, unspecified: Secondary | ICD-10-CM | POA: Insufficient documentation

## 2015-04-23 LAB — COMPREHENSIVE METABOLIC PANEL
ALBUMIN: 3.7 g/dL (ref 3.5–5.0)
ALK PHOS: 56 U/L (ref 38–126)
ALT: 15 U/L — ABNORMAL LOW (ref 17–63)
ANION GAP: 3 — AB (ref 5–15)
AST: 32 U/L (ref 15–41)
BILIRUBIN TOTAL: 1 mg/dL (ref 0.3–1.2)
BUN: 18 mg/dL (ref 6–20)
CALCIUM: 9 mg/dL (ref 8.9–10.3)
CO2: 30 mmol/L (ref 22–32)
Chloride: 101 mmol/L (ref 101–111)
Creatinine, Ser: 0.79 mg/dL (ref 0.61–1.24)
Glucose, Bld: 119 mg/dL — ABNORMAL HIGH (ref 65–99)
Potassium: 4.4 mmol/L (ref 3.5–5.1)
Sodium: 134 mmol/L — ABNORMAL LOW (ref 135–145)
TOTAL PROTEIN: 6 g/dL — AB (ref 6.5–8.1)

## 2015-04-23 LAB — CBC WITH DIFFERENTIAL/PLATELET
BASOS PCT: 0 %
Basophils Absolute: 0 10*3/uL (ref 0–0.1)
Eosinophils Absolute: 1.9 10*3/uL — ABNORMAL HIGH (ref 0–0.7)
Eosinophils Relative: 15 %
HEMATOCRIT: 41.2 % (ref 40.0–52.0)
HEMOGLOBIN: 13.9 g/dL (ref 13.0–18.0)
LYMPHS ABS: 3.2 10*3/uL (ref 1.0–3.6)
Lymphocytes Relative: 26 %
MCH: 32.9 pg (ref 26.0–34.0)
MCHC: 33.8 g/dL (ref 32.0–36.0)
MCV: 97.5 fL (ref 80.0–100.0)
MONOS PCT: 9 %
Monocytes Absolute: 1.2 10*3/uL — ABNORMAL HIGH (ref 0.2–1.0)
NEUTROS ABS: 6.4 10*3/uL (ref 1.4–6.5)
NEUTROS PCT: 50 %
Platelets: 454 10*3/uL — ABNORMAL HIGH (ref 150–440)
RBC: 4.22 MIL/uL — ABNORMAL LOW (ref 4.40–5.90)
RDW: 14 % (ref 11.5–14.5)
WBC: 12.7 10*3/uL — ABNORMAL HIGH (ref 3.8–10.6)

## 2015-04-23 LAB — MAGNESIUM: MAGNESIUM: 2 mg/dL (ref 1.7–2.4)

## 2015-04-23 MED ORDER — CYANOCOBALAMIN 1000 MCG/ML IJ SOLN: 1000.0000 ug | INTRAMUSCULAR | Status: DC

## 2015-04-23 MED ORDER — PREDNISONE 10 MG PO TABS: ORAL_TABLET | ORAL | Status: DC

## 2015-04-23 NOTE — Progress Notes (Signed)
[place] @ [facility] [phone]  Fax:([phone]     Riku Buttery OB: 23-Sep-1929  MR#: [id number]  ZLD#:[id number]  Patient Care Team: Juline Patch, MD as PCP - General (Family Medicine) Kathrynn Ducking, MD (Neurology) Forest Gleason, MD as Consulting Physician (Unknown Physician Specialty) Carlena Bjornstad, MD as Consulting Physician (Cardiology) Seeplaputhur Robinette Haines, MD (General Surgery)  CHIEF COMPLAINT:  Chief Complaint  Patient presents with  . Lymphatic leukemia   Oncology History   Chief Complaint/Problem List  1.  Autoimmune hemolytic anemia. Previously received Prednisone taper.   2.  Splenectomy in 5/02.  3.  Gastric ulcer diagnosed on endoscopy and multiple biopsies are pending, November 2005. 4.  Chronic lymphocytic leukemia. 5.vitamin B 12 deficiency 6.  Recent admission in the hospital in June of 2016 with hemolytic anemia and hemoglobin of 5.5 patient received blood transfusion was started on steroid 7.  Patient was started on rituximab (October 20, 2014        Oncology Flowsheet 11/24/2014 11/25/2014 11/26/2014 11/27/2014 11/28/2014 01/01/2015 02/26/2015  cyanocobalamin ((VITAMIN B-12)) IM - - - - - - -  enoxaparin (LOVENOX) [place] 40 mg 40 mg - 40 mg 40 mg - -  methylPREDNISolone sodium succinate 40 mg/mL (SOLU-MEDROL) IV - - - - - - -  predniSONE (DELTASONE) PO 10 mg 10 mg 10 mg 10 mg 10 mg - -  riTUXimab (RITUXAN) IV - - - - - 375 mg/m2 375 mg/m2  zolendronic acid (ZOMETA) IV - - - - - - -    INTERVAL HISTORY:   82 year old gentleman came today for further follow-up regarding cellulitis left thigh and groin area.  Is and is chronic lymphocytic leukemia with hemolytic anemia Here for further follow-up regarding autoimmune hemolytic anemia with chronic lymphocytic leukemia  Past abdomen patient was seen had acute upper respiratory tract infection symptoms which is resolved.  Patient is on prednisone 10 mg twice a week. Feeling stronger.  Redness  has improved.  Appetite has been getting better.  Hemoglobin is improved   REVIEW OF SYSTEMS:    general status: Patient is feeling weak and tired.  No change in a performance status.  No chills.  No fever. HEENT: No evidence of stomatitis Lungs as per history of present illness Cardiac: No chest pain or paroxysmal nocturnal dyspnea GI: No nausea no vomiting no diarrhea no abdominal pain Skin: No rash Lower extremity no swelling Neurological system: No tingling.  No numbness.  No other focal signs Musculoskeletal system no bony pains Left groin: Redness has improved swelling is getting better All other systems have been reviewed As per HPI. Otherwise, a complete review of systems is negatve.  PAST MEDICAL HISTORY: Past Medical History  Diagnosis Date  . CLL (chronic lymphoblastic leukemia) 2006    Rituxan treatment 2006  . Atrial septal aneurysm 2009    Insignificant, no, January, 2009  . Infection of PEG site ([place])     cellulitis.Marland KitchenResolved  . Vertebral compression fracture (HCC)     multiple levels  . Herpes zoster     right chest  . Aortic insufficiency 2009    mild, Mild, echo, 2012  . Diastolic dysfunction     mild  . Chest pain     EF 55%, echo, October, 2009, possible inferior and posterior borderline hypokinesis, patient has never  had cardiac catheterization  . Edema     EF 55%, echo, October, 2009, possible borderline inferior and posterior hypokinesis... heart catheterization has never been done(July 10, 2009)  . Myositis     Caused pulmonary insufficiency with muscle weakness in the past / Dr.Willis-neurology, Imuran and prednisone  . Calculus of kidney   . Inguinal hernia without mention of obstruction or gangrene, unilateral or unspecified, (not specified as recurrent)     right   . Unspecified gastritis and gastroduodenitis without mention of hemorrhage   . Abdominal pain, epigastric   . Nonspecific elevation of levels of transaminase or lactic acid  dehydrogenase (LDH)   . Feeding difficulties and mismanagement   . Edema of male genital organs     Resolved, occurred during illness related to myositis  . Swelling of arm     left.Marland KitchenMarland KitchenResolved.... no DVT  . LFT elevation     Related to Imuran in the past... dose was adjusted  . Dyslipidemia     statins not used due to LFT abnormalities  . Shortness of breath     Breathing abnormalities related to muscle weakness from myositis  . RBBB (right bundle branch block)     Old  . Bradycardia     Sinus bradycardia, old  . Ejection fraction     EF 60%, echo, October, 2012  . Mitral regurgitation     Mild / moderate, echo, 2012  . Right ventricular dysfunction     Mild, echo, 2012  . Febrile illness     May, 2013, question sinusitis  . Polymyositis ([place]) 11/26/2012  . Hemolytic anemia ([place])   . Cellulitis   . Chronic diastolic CHF (congestive heart failure) ([place]) 03/17/2015    PAST SURGICAL HISTORY: Past Surgical History  Procedure Laterality Date  . Splenectomy    . Hernia repair      left side  . Peg placement  06/2007  . Peg tube removal  12/2007  . Kyphosis surgery      multilevel vertebral  . Odontoid fracture surgery      anterior screw fixation  . Nasal sinus surgery      x2  . Cataract extraction      bi lateral    FAMILY HISTORY Family History  Problem Relation Age of Onset  . Breast cancer Sister   . Colon cancer Brother   . Prostate cancer Brother   . Stroke Mother   . Hypertension Mother   . Emphysema Father   . Diabetes Grandchild   . Heart attack Neg Hx   . Cancer Brother   . Cancer Sister   . Cancer Daughter     ADVANCED DIRECTIVES:  Patient does have advance healthcare directive, Patient   does not desire to make any changes  HEALTH MAINTENANCE: Social History  Substance Use Topics  . Smoking status: Former Smoker -- 0.50 packs/day for 31 years    Types: Cigarettes    Quit date: 06/29/1968  . Smokeless tobacco: Former Systems developer    Types: Chew  .  Alcohol Use: No     Comment: rarely      Allergies  Allergen Reactions  . Sulfonamide Derivatives     Current Outpatient Prescriptions  Medication Sig Dispense Refill  . albuterol (PROVENTIL) (2.5 MG/3ML) 0.083% nebulizer solution Take 3 mLs (2.5 mg total) by nebulization every 6 (six) hours as needed for wheezing or shortness of breath. 75 mL 12  . calcium-vitamin D (CALCIUM 500+D) 500-200 MG-UNIT per tablet Take 1 tablet by mouth 2 (two) times daily.      . Cholecalciferol (VITAMIN D3) 1000 UNITS tablet Take 1,000 Units by mouth daily.      Marland Kitchen  cyanocobalamin (,VITAMIN B-12,) 1000 MCG/ML injection Inject 1,000 mcg into the muscle every 14 (fourteen) days. At the first of the month    . ferrous sulfate 325 (65 FE) MG tablet Take 325 mg by mouth daily with breakfast.      . fish oil-omega-3 fatty acids 1000 MG capsule Take 1 g by mouth daily at 12 noon.     . folic acid (FOLVITE) 1 MG tablet Take 4 tablets (4 mg total) by mouth daily. 120 tablet 0  . furosemide (LASIX) 80 MG tablet Take 1 tablet (80 mg total) by mouth daily. 90 tablet 3  . HYDROcodone-acetaminophen (NORCO/VICODIN) 5-325 MG per tablet Take 1 tablet by mouth every 6 (six) hours as needed. 120 tablet 0  . metroNIDAZOLE (FLAGYL) 500 MG tablet Take 1 tablet (500 mg total) by mouth 3 (three) times daily. 21 tablet 0  . montelukast (SINGULAIR) 10 MG tablet Take 10 mg by mouth at bedtime.   11  . Multiple Vitamin (MULTIVITAMIN) capsule Take 1 capsule by mouth daily.      . mycophenolate (CELLCEPT) 500 MG tablet Take 1 tablet (500 mg total) by mouth daily. 90 tablet 3  . nystatin cream (MYCOSTATIN) APPLY TOPICALLY TWICE DAILY 30 g 0  . pantoprazole (PROTONIX) 40 MG tablet Take 1 tablet (40 mg total) by mouth daily at 12 noon. 90 tablet 1  . potassium chloride SA (K-DUR,KLOR-CON) 20 MEQ tablet Take 1 tablet (20 mEq total) by mouth 2 (two) times daily. 180 tablet 3  . predniSONE (DELTASONE) 20 MG tablet Take 0.5 tablet by mouth 2  times a week 90 tablet 0  . PROAIR HFA 108 (90 BASE) MCG/ACT inhaler Inhale 2 puffs into the lungs 4 (four) times daily.   11  . Resource Beneprotein PACK Take 3g three times a day    . temazepam (RESTORIL) 30 MG capsule Take 1 capsule (30 mg total) by mouth at bedtime. 30 capsule 5   No current facility-administered medications for this visit.    OBJECTIVE:  Filed Vitals:   04/23/15 0845  BP: 104/74  Pulse: 76  Temp: 96.7 F (35.9 C)  Resp: 18     Body mass index is 22.27 kg/(m^2).    ECOG FS:1 - Symptomatic but completely ambulatory  PHYSICAL EXAM: Gen. status: Patient is feeling somewhat weak and tired.  Somewhat anxious. Lymphatic system: Supraclavicular, cervical, axillary, inguinal lymph nodes are not palpable Head exam was generally normal. There was no scleral icterus or corneal arcus. Mucous membranes were moist. Cardiac: Tachycardia Lungs: Kyphoscoliosis.  Diminished air entry on both sides.  Coarse crepitation at both bases.Occasional rhonchi  Abdominal exam revealed normal bowel sounds. The abdomen was soft, non-tender, and without masses, organomegaly, or appreciable enlargement of the abdominal aorta. Examination of the skin revealed no evidence of significant rashes, suspicious appearing nevi or other concerning lesions.   Kyphoscoliosis: Osteoporosis and multiple vertebral compression fracture  Skin: Grade 1 rash LAB RESULTS:  Appointment on 04/23/2015  Component Date Value Ref Range Status  . WBC 04/23/2015 12.7* 3.8 - 10.6 K/uL Final  . RBC 04/23/2015 4.22* 4.40 - 5.90 MIL/uL Final  . Hemoglobin 04/23/2015 13.9  13.0 - 18.0 g/dL Final  . HCT 04/23/2015 41.2  40.0 - 52.0 % Final  . MCV 04/23/2015 97.5  80.0 - 100.0 fL Final  . MCH 04/23/2015 32.9  26.0 - 34.0 pg Final  . MCHC 04/23/2015 33.8  32.0 - 36.0 g/dL Final  . RDW 04/23/2015 14.0  11.5 -  14.5 % Final  . Platelets 04/23/2015 454* 150 - 440 K/uL Final  . Neutrophils Relative % 04/23/2015 50   Final    . Neutro Abs 04/23/2015 6.4  1.4 - 6.5 K/uL Final  . Lymphocytes Relative 04/23/2015 26   Final  . Lymphs Abs 04/23/2015 3.2  1.0 - 3.6 K/uL Final  . Monocytes Relative 04/23/2015 9   Final  . Monocytes Absolute 04/23/2015 1.2* 0.2 - 1.0 K/uL Final  . Eosinophils Relative 04/23/2015 15   Final  . Eosinophils Absolute 04/23/2015 1.9* 0 - 0.7 K/uL Final  . Basophils Relative 04/23/2015 0   Final  . Basophils Absolute 04/23/2015 0.0  0 - 0.1 K/uL Final  . Sodium 04/23/2015 134* 135 - 145 mmol/L Final  . Potassium 04/23/2015 4.4  3.5 - 5.1 mmol/L Final  . Chloride 04/23/2015 101  101 - 111 mmol/L Final  . CO2 04/23/2015 30  22 - 32 mmol/L Final  . Glucose, Bld 04/23/2015 119* 65 - 99 mg/dL Final  . BUN 04/23/2015 18  6 - 20 mg/dL Final  . Creatinine, Ser 04/23/2015 0.79  0.61 - 1.24 mg/dL Final  . Calcium 04/23/2015 9.0  8.9 - 10.3 mg/dL Final  . Total Protein 04/23/2015 6.0* 6.5 - 8.1 g/dL Final  . Albumin 04/23/2015 3.7  3.5 - 5.0 g/dL Final  . AST 04/23/2015 32  15 - 41 U/L Final  . ALT 04/23/2015 15* 17 - 63 U/L Final  . Alkaline Phosphatase 04/23/2015 56  38 - 126 U/L Final  . Total Bilirubin 04/23/2015 1.0  0.3 - 1.2 mg/dL Final  . GFR calc non Af Amer 04/23/2015 >60  >60 mL/min Final  . GFR calc Af Amer 04/23/2015 >60  >60 mL/min Final   Comment: (NOTE) The eGFR has been calculated using the CKD EPI equation. This calculation has not been validated in all clinical situations. eGFR's persistently <60 mL/min signify possible Chronic Kidney Disease.   . Anion gap 04/23/2015 3* 5 - 15 Final  . Magnesium 04/23/2015 2.0  1.7 - 2.4 mg/dL Final      STUDIES:  ASSESSMENT:/MEDICAL DECISION MAKING: Chronic lymphocytic leukemia on Rituxan Autoimmune hemolytic anemia at present time hemoglobin is improved to 13.   Prednisone has been decreased to 5 mg twice a day We will hold off any further cetuximab at present time and continue to monitor hemoglobin Reevaluate patient in 6 4-6  weeks with hemoglobin and gradually take patient off steroid        No matching staging information was found for the patient.  Forest Gleason, MD   04/23/2015 9:05 AM

## 2015-05-11 ENCOUNTER — Ambulatory Visit (INDEPENDENT_AMBULATORY_CARE_PROVIDER_SITE_OTHER): Payer: Medicare Other | Admitting: Family Medicine

## 2015-05-11 ENCOUNTER — Encounter: Payer: Self-pay | Admitting: Family Medicine

## 2015-05-11 VITALS — BP 120/60 | HR 60 | Ht 65.0 in | Wt 131.0 lb

## 2015-05-11 DIAGNOSIS — G4700 Insomnia, unspecified: Secondary | ICD-10-CM | POA: Diagnosis not present

## 2015-05-11 DIAGNOSIS — E785 Hyperlipidemia, unspecified: Secondary | ICD-10-CM

## 2015-05-11 DIAGNOSIS — J4520 Mild intermittent asthma, uncomplicated: Secondary | ICD-10-CM

## 2015-05-11 DIAGNOSIS — M3320 Polymyositis, organ involvement unspecified: Secondary | ICD-10-CM

## 2015-05-11 DIAGNOSIS — M5137 Other intervertebral disc degeneration, lumbosacral region: Secondary | ICD-10-CM

## 2015-05-11 DIAGNOSIS — C9110 Chronic lymphocytic leukemia of B-cell type not having achieved remission: Secondary | ICD-10-CM

## 2015-05-11 DIAGNOSIS — Z0000 Encounter for general adult medical examination without abnormal findings: Secondary | ICD-10-CM | POA: Diagnosis not present

## 2015-05-11 MED ORDER — MONTELUKAST SODIUM 10 MG PO TABS: 10.0000 mg | ORAL_TABLET | Freq: Every day | ORAL | Status: DC

## 2015-05-11 MED ORDER — HYDROCODONE-ACETAMINOPHEN 5-325 MG PO TABS: 1.0000 | ORAL_TABLET | Freq: Three times a day (TID) | ORAL | Status: DC

## 2015-05-11 MED ORDER — TEMAZEPAM 30 MG PO CAPS: 30.0000 mg | ORAL_CAPSULE | Freq: Every day | ORAL | Status: DC

## 2015-05-11 NOTE — Progress Notes (Signed)
Patient: [name] [name], Male    DOB: 1930-01-13, 82 y.o.   MRN: [id number] Visit Date: 05/11/2015  Today's Provider: Otilio Miu, MD   Chief Complaint  Patient presents with  . Medicare Wellness    needs Singulair, Temazepam, Hydrocodone   Subjective:   Initial preventative physical exam [name] [name] is a 82 y.o. male who presents today for his Initial Preventative Physical Exam. He feels well. He reports exercising as tolerated. He reports he is sleeping well.  Back Pain This is a chronic problem. The current episode started more than 1 year ago. The problem occurs constantly. The problem has been gradually worsening since onset. The pain is present in the lumbar spine. The quality of the pain is described as burning and aching. The pain is at a severity of 7/10. The pain is moderate. The pain is worse during the day. Pertinent negatives include no abdominal pain, bladder incontinence, bowel incontinence, chest pain, dysuria, fever, headaches, leg pain, numbness, paresis, paresthesias, pelvic pain, perianal numbness, tingling, weakness or weight loss. He has tried analgesics for the symptoms. The treatment provided significant relief.  Insomnia Primary symptoms: fragmented sleep, difficulty falling asleep.  The current episode started more than one year. The problem occurs nightly. The problem has been gradually improving since onset. Past treatments include medication. PMH includes: no hypertension, no depression.  Asthma He complains of shortness of breath. There is no cough or wheezing. This is a chronic problem. The current episode started more than 1 year ago. The problem has been gradually improving. Associated symptoms include dyspnea on exertion. Pertinent negatives include no appetite change, chest pain, ear pain, fever, headaches, nasal congestion, sneezing, sore throat, trouble swallowing or weight loss. His past medical history is significant for asthma.    Review of Systems   Constitutional: Negative for fever, chills, weight loss, appetite change, fatigue and unexpected weight change.  HENT: Negative for ear pain, facial swelling, hearing loss, nosebleeds, sneezing, sore throat and trouble swallowing.   Eyes: Negative for photophobia, pain, discharge, redness, itching and visual disturbance.  Respiratory: Positive for shortness of breath. Negative for cough, choking, chest tightness and wheezing.   Cardiovascular: Positive for dyspnea on exertion. Negative for chest pain, palpitations and leg swelling.  Gastrointestinal: Negative for nausea, vomiting, abdominal pain, diarrhea, constipation, blood in stool, rectal pain and bowel incontinence.  Endocrine: Negative for cold intolerance, heat intolerance, polydipsia, polyphagia and polyuria.  Genitourinary: Negative for bladder incontinence, dysuria, urgency, frequency, hematuria, flank pain, decreased urine volume, discharge, penile swelling, scrotal swelling, difficulty urinating, penile pain, testicular pain and pelvic pain.  Musculoskeletal: Positive for back pain. Negative for joint swelling, neck pain and neck stiffness.  Skin: Negative for color change and rash.  Allergic/Immunologic: Negative for immunocompromised state.  Neurological: Negative for dizziness, tingling, tremors, seizures, syncope, speech difficulty, weakness, light-headedness, numbness, headaches and paresthesias.  Hematological: Does not bruise/bleed easily.  Psychiatric/Behavioral: Negative for depression, suicidal ideas, hallucinations, behavioral problems, confusion, self-injury, dysphoric mood and agitation. The patient has insomnia. The patient is not nervous/anxious.     Social History   Social History  . Marital Status: Widowed    Spouse Name: N/A  . Number of Children: 4  . Years of Education: college   Occupational History  . retired    Social History Main Topics  . Smoking status: Former Smoker -- 0.50 packs/day for 31 years     Types: Cigarettes    Quit date: 06/29/1968  . Smokeless tobacco: Former Systems developer    Types:  Chew  . Alcohol Use: No     Comment: rarely  . Drug Use: Not on file  . Sexual Activity: No   Other Topics Concern  . Not on file   Social History Narrative   Patient is widowed and lives alone.   Patient is retired.   Patient has four adult children.   Patient has a college degree.   Patient is right-handed.   Patient drinks one cup of coffee daily.    Patient Active Problem List   Diagnosis Date Noted  . Chronic diastolic CHF (congestive heart failure) ([place]) 03/17/2015  . Coronary artery disease 03/17/2015  . Aortic atherosclerosis ([place]) 03/17/2015  . Chronic obstructive pulmonary disease with acute exacerbation ([place]) 01/06/2015  . Right ventricular failure ([place]) 01/06/2015  . Chronic cellulitis 01/06/2015  . Cellulitis 11/19/2014  . Acute hemolytic anemia ([place]) 10/12/2014  . CLL (chronic lymphocytic leukemia) ([place])   . Hemolytic anemia associated with lymphoproliferative disorder ([place])   . Jaundice 10/10/2014  . Hypotension 10/10/2014  . DDD (degenerative disc disease), lumbosacral 08/27/2014  . H/O disease 08/27/2014  . Muscle ache 08/27/2014  . Raynaud's phenomenon 08/27/2014  . Special screening for malignant neoplasms, colon 08/27/2014  . Acute bronchitis 11/01/2013  . Polymyositis ([place]) 11/26/2012  . Febrile illness   . Bradycardia   . Ejection fraction   . Aortic insufficiency   . CLL (chronic lymphoblastic leukemia)   . Atrial septal aneurysm   . Vertebral compression fracture ([place])   . Herpes zoster   . Diastolic dysfunction   . Chest pain   . Edema   . Myositis   . Dyslipidemia   . Shortness of breath   . HYPOKALEMIA 01/19/2009  . Chronic lymphocytic leukemia ([place]) 08/01/2008  . Hyperlipidemia 07/22/2008  . ANEMIA 07/22/2008  . MYOSITIS 07/22/2008  . OSTEOPOROSIS 07/22/2008  . INGUINAL HERNIA, RIGHT 06/30/2008  . ABDOMINAL PAIN, EPIGASTRIC 05/29/2008   . FEEDING PROBLEM 12/24/2007  . ABNORMAL TRANSAMINASE-LFT'S 12/24/2007  . DYSPNEA 05/22/2007    Past Surgical History  Procedure Laterality Date  . Splenectomy    . Hernia repair      left side  . Peg placement  06/2007  . Peg tube removal  12/2007  . Kyphosis surgery      multilevel vertebral  . Odontoid fracture surgery      anterior screw fixation  . Nasal sinus surgery      x2  . Cataract extraction      bi lateral    His family history includes Breast cancer in his sister; Cancer in his brother, daughter, and sister; Colon cancer in his brother; Diabetes in his grandchild; Emphysema in his father; Hypertension in his mother; Prostate cancer in his brother; Stroke in his mother. There is no history of Heart attack.    Previous Medications   ALBUTEROL (PROVENTIL) (2.5 MG/3ML) 0.083% NEBULIZER SOLUTION    Take 3 mLs (2.5 mg total) by nebulization every 6 (six) hours as needed for wheezing or shortness of breath.   CALCIUM-VITAMIN D (CALCIUM 500+D) 500-200 MG-UNIT PER TABLET    Take 1 tablet by mouth 2 (two) times daily.     CHOLECALCIFEROL (VITAMIN D3) 1000 UNITS TABLET    Take 1,000 Units by mouth daily.     CYANOCOBALAMIN (,VITAMIN B-12,) 1000 MCG/ML INJECTION    Inject 1 mL (1,000 mcg total) into the muscle every 14 (fourteen) days. At the first of the month   FERROUS SULFATE 325 (65 FE) MG TABLET  Take 325 mg by mouth daily with breakfast.     FISH OIL-OMEGA-3 FATTY ACIDS 1000 MG CAPSULE    Take 1 g by mouth daily at 12 noon.    FOLIC ACID (FOLVITE) 1 MG TABLET    Take 4 tablets (4 mg total) by mouth daily.   FUROSEMIDE (LASIX) 80 MG TABLET    Take 1 tablet (80 mg total) by mouth daily.   MULTIPLE VITAMIN (MULTIVITAMIN) CAPSULE    Take 1 capsule by mouth daily.     MYCOPHENOLATE (CELLCEPT) 500 MG TABLET    Take 1 tablet (500 mg total) by mouth daily.   NYSTATIN CREAM (MYCOSTATIN)    APPLY TOPICALLY TWICE DAILY   PANTOPRAZOLE (PROTONIX) 40 MG TABLET    Take 1 tablet (40 mg  total) by mouth daily at 12 noon.   POTASSIUM CHLORIDE SA (K-DUR,KLOR-CON) 20 MEQ TABLET    Take 1 tablet (20 mEq total) by mouth 2 (two) times daily.   PREDNISONE (DELTASONE) 10 MG TABLET    Take as directed   PROAIR HFA 108 (90 BASE) MCG/ACT INHALER    Inhale 2 puffs into the lungs 4 (four) times daily.    RESOURCE BENEPROTEIN PACK    Take 3g three times a day    Patient Care Team: Juline Patch, MD as PCP - General (Family Medicine) Kathrynn Ducking, MD (Neurology) Forest Gleason, MD as Consulting Physician (Unknown Physician Specialty) Carlena Bjornstad, MD as Consulting Physician (Cardiology) Seeplaputhur Robinette Haines, MD (General Surgery)     Objective:   Vitals: BP 120/60 mmHg  Pulse 60  Ht 5\' 5"  (1.651 m)  Wt 131 lb (59.421 kg)  BMI 21.80 kg/m2  Physical Exam  Constitutional: He is oriented to person, place, and time. He appears well-developed and well-nourished.  HENT:  Head: Normocephalic.  Right Ear: External ear normal.  Left Ear: External ear normal.  Nose: Nose normal.  Mouth/Throat: Oropharynx is clear and moist.  Eyes: Conjunctivae and EOM are normal. Pupils are equal, round, and reactive to light.  Neck: Normal range of motion. Neck supple.  Cardiovascular: Normal rate, regular rhythm, normal heart sounds and intact distal pulses.   Pulmonary/Chest: Effort normal and breath sounds normal.  Abdominal: Soft. Bowel sounds are normal.  Genitourinary: Rectum normal, prostate normal and penis normal.  Musculoskeletal: Normal range of motion.  Neurological: He is alert and oriented to person, place, and time. He has normal reflexes.  Skin: Skin is warm and dry.  Psychiatric: He has a normal mood and affect. His behavior is normal. Judgment and thought content normal.  Nursing note and vitals reviewed.    No exam data present  Activities of Daily Living In your present state of health, do you have any difficulty performing the following activities: 05/11/2015 04/15/2015   Hearing? N N  Vision? N N  Difficulty concentrating or making decisions? N N  Walking or climbing stairs? N N  Dressing or bathing? N N  Doing errands, shopping? N N    Fall Risk Assessment Fall Risk  05/11/2015 04/15/2015 12/11/2014  Falls in the past year? No No Yes  Number falls in past yr: - - 1  Injury with Fall? - - No  Follow up - - Falls evaluation completed     Patient reports there are safety devices in place in shower at home.   Depression Screen PHQ 2/9 Scores 05/11/2015 04/15/2015 12/11/2014  PHQ - 2 Score 0 0 0    Cognitive Testing -  6-CIT   Correct? Score   What year is it? yes 0 Yes = 0    No = 4  What month is it? yes 0 Yes = 0    No = 3  Remember:     Pia [address], [place], [place]     What time is it? yes 0 Yes = 0    No = 3  Count backwards from 20 to 1 yes 0 Correct = 0    1 error = 2   More than 1 error = 4  Say the months of the year in reverse. yes 0 Correct = 0    1 error = 2   More than 1 error = 4  What address did I ask you to remember? yes 0 Correct = 0  1 error = 2    2 error = 4    3 error = 6    4 error = 8    All wrong = 10       TOTAL SCORE  0/28   Interpretation:  Normal  Normal (0-7) Abnormal (8-28)     Assessment & Plan:     Initial Preventative Physical Exam  Reviewed patient's Family Medical History Reviewed and updated list of patient's medical providers Assessment of cognitive impairment was done Assessed patient's functional ability Established a written schedule for health screening Combs Completed and Reviewed  Exercise Activities and Dietary recommendations Goals    None      Immunization History  Administered Date(s) Administered  . Influenza,inj,Quad PF,36+ Mos 01/06/2015    Health Maintenance  Topic Date Due  . TETANUS/TDAP  01/12/1951  . ZOSTAVAX  01/12/1992  . PNA vac Low Risk Adult (1 of 2 - PCV13) 01/11/1997  . INFLUENZA VACCINE  11/10/2015      Discussed health benefits  of physical activity, and encouraged him to engage in regular exercise appropriate for his [name] and condition.    ------------------------------------------------------------------------------------------------------------   Problem List Items Addressed This Visit      Musculoskeletal and Integument   DDD (degenerative disc disease), lumbosacral   Relevant Medications   HYDROcodone-acetaminophen (NORCO/VICODIN) 5-325 MG tablet   Polymyositis (HCC) (Chronic)   Relevant Medications   HYDROcodone-acetaminophen (NORCO/VICODIN) 5-325 MG tablet     Other   Chronic lymphocytic leukemia (HCC) (Chronic)   Relevant Medications   HYDROcodone-acetaminophen (NORCO/VICODIN) 5-325 MG tablet   montelukast (SINGULAIR) 10 MG tablet   Hyperlipidemia   Relevant Orders   Lipid Profile    Other Visit Diagnoses    Medicare annual wellness visit, subsequent    -  Primary    Insomnia        Relevant Medications    temazepam (RESTORIL) 30 MG capsule    Degeneration of intervertebral disc of lumbosacral region        Relevant Medications    HYDROcodone-acetaminophen (NORCO/VICODIN) 5-325 MG tablet    Asthma, mild intermittent, uncomplicated        Relevant Medications    montelukast (SINGULAIR) 10 MG tablet        Otilio Miu, MD [facility]  05/11/2015

## 2015-05-12 LAB — LIPID PANEL
CHOLESTEROL TOTAL: 195 mg/dL (ref 100–199)
Chol/HDL Ratio: 3.7 ratio units (ref 0.0–5.0)
HDL: 53 mg/dL (ref >39)
LDL Calculated: 111 mg/dL — ABNORMAL HIGH (ref 0–99)
Triglycerides: 154 mg/dL — ABNORMAL HIGH (ref 0–149)
VLDL CHOLESTEROL CAL: 31 mg/dL (ref 5–40)

## 2015-06-04 ENCOUNTER — Encounter: Payer: Self-pay | Admitting: Oncology

## 2015-06-04 ENCOUNTER — Inpatient Hospital Stay (HOSPITAL_BASED_OUTPATIENT_CLINIC_OR_DEPARTMENT_OTHER): Payer: Medicare Other | Admitting: Oncology

## 2015-06-04 ENCOUNTER — Inpatient Hospital Stay: Payer: Medicare Other | Attending: Oncology

## 2015-06-04 VITALS — BP 123/70 | HR 69 | Temp 96.9°F | Resp 18 | Wt 131.8 lb

## 2015-06-04 DIAGNOSIS — E538 Deficiency of other specified B group vitamins: Secondary | ICD-10-CM

## 2015-06-04 DIAGNOSIS — E785 Hyperlipidemia, unspecified: Secondary | ICD-10-CM | POA: Insufficient documentation

## 2015-06-04 DIAGNOSIS — D589 Hereditary hemolytic anemia, unspecified: Secondary | ICD-10-CM | POA: Diagnosis not present

## 2015-06-04 DIAGNOSIS — I5032 Chronic diastolic (congestive) heart failure: Secondary | ICD-10-CM

## 2015-06-04 DIAGNOSIS — Z87891 Personal history of nicotine dependence: Secondary | ICD-10-CM | POA: Diagnosis not present

## 2015-06-04 DIAGNOSIS — Z7952 Long term (current) use of systemic steroids: Secondary | ICD-10-CM

## 2015-06-04 DIAGNOSIS — I351 Nonrheumatic aortic (valve) insufficiency: Secondary | ICD-10-CM | POA: Insufficient documentation

## 2015-06-04 DIAGNOSIS — Z79899 Other long term (current) drug therapy: Secondary | ICD-10-CM

## 2015-06-04 DIAGNOSIS — D479 Neoplasm of uncertain behavior of lymphoid, hematopoietic and related tissue, unspecified: Secondary | ICD-10-CM

## 2015-06-04 DIAGNOSIS — Z87442 Personal history of urinary calculi: Secondary | ICD-10-CM

## 2015-06-04 DIAGNOSIS — I4510 Unspecified right bundle-branch block: Secondary | ICD-10-CM | POA: Insufficient documentation

## 2015-06-04 DIAGNOSIS — C9110 Chronic lymphocytic leukemia of B-cell type not having achieved remission: Secondary | ICD-10-CM

## 2015-06-04 DIAGNOSIS — J069 Acute upper respiratory infection, unspecified: Secondary | ICD-10-CM

## 2015-06-04 DIAGNOSIS — Z9081 Acquired absence of spleen: Secondary | ICD-10-CM

## 2015-06-04 DIAGNOSIS — D594 Other nonautoimmune hemolytic anemias: Secondary | ICD-10-CM

## 2015-06-04 LAB — CBC WITH DIFFERENTIAL/PLATELET
Basophils Absolute: 0.1 10*3/uL (ref 0–0.1)
Basophils Relative: 1 %
EOS PCT: 1 %
Eosinophils Absolute: 0.1 10*3/uL (ref 0–0.7)
HCT: 40.2 % (ref 40.0–52.0)
HEMOGLOBIN: 13.5 g/dL (ref 13.0–18.0)
LYMPHS ABS: 4.3 10*3/uL — AB (ref 1.0–3.6)
LYMPHS PCT: 32 %
MCH: 33.1 pg (ref 26.0–34.0)
MCHC: 33.6 g/dL (ref 32.0–36.0)
MCV: 98.5 fL (ref 80.0–100.0)
MONOS PCT: 10 %
Monocytes Absolute: 1.3 10*3/uL — ABNORMAL HIGH (ref 0.2–1.0)
NEUTROS PCT: 56 %
Neutro Abs: 7.5 10*3/uL — ABNORMAL HIGH (ref 1.4–6.5)
Platelets: 316 10*3/uL (ref 150–440)
RBC: 4.09 MIL/uL — AB (ref 4.40–5.90)
RDW: 14.2 % (ref 11.5–14.5)
WBC: 13.3 10*3/uL — AB (ref 3.8–10.6)

## 2015-06-04 MED ORDER — LEVOFLOXACIN 500 MG PO TABS: 500.0000 mg | ORAL_TABLET | Freq: Every day | ORAL | Status: DC

## 2015-06-04 MED ORDER — CYANOCOBALAMIN 1000 MCG/ML IJ SOLN: 1000.0000 ug | INTRAMUSCULAR | Status: DC

## 2015-06-04 NOTE — Progress Notes (Signed)
[facility]  [phone]  Fax:([phone]     [name] [name] DOB: 1929-07-23  MR#: [id number]  [id number]  Patient Care Team: Juline Patch, MD as PCP - General (Family Medicine) Kathrynn Ducking, MD (Neurology) Forest Gleason, MD as Consulting Physician (Unknown Physician Specialty) Carlena Bjornstad, MD as Consulting Physician (Cardiology) Seeplaputhur Robinette Haines, MD (General Surgery)  CHIEF COMPLAINT:  Chief Complaint  Patient presents with  . CLL    INTERVAL HISTORY: Patient is here for further evaluation regarding CLL and hemolytic anemia. Patient is currently taking 5 mg of prednisone twice per week for management of hemolytic anemia. He overall reports feeling very well except for cough that is persistent and has lasted the last 2 months and a round of antibiotics, Levaquin. He has also been using his albuterol nebulizer as well as Mucinex OTC. He is also having some pain tan sputum production. Otherwise patient reports feeling very well and denies any other acute complaints.  REVIEW OF SYSTEMS:   Review of Systems  Constitutional: Negative for fever, chills, weight loss, malaise/fatigue and diaphoresis.  HENT: Negative.   Eyes: Negative.   Respiratory: Positive for cough and sputum production. Negative for hemoptysis, shortness of breath and wheezing.        Tan colored sputum  Cardiovascular: Negative for chest pain, palpitations, orthopnea, claudication, leg swelling and PND.  Gastrointestinal: Negative for heartburn, nausea, vomiting, abdominal pain, diarrhea, constipation, blood in stool and melena.  Genitourinary: Negative.   Musculoskeletal: Negative.   Skin: Negative.   Neurological: Negative for dizziness, tingling, focal weakness, seizures and weakness.  Endo/Heme/Allergies: Does not bruise/bleed easily.  Psychiatric/Behavioral: Negative for depression. The patient is not nervous/anxious and does not have insomnia.     As per HPI.  Otherwise, a complete review of systems is negatve.  ONCOLOGY HISTORY: Oncology History   Chief Complaint/Problem List  1.  Autoimmune hemolytic anemia. Previously received Prednisone taper.   2.  Splenectomy in 5/02.  3.  Gastric ulcer diagnosed on endoscopy and multiple biopsies are pending, November 2005. 4.  Chronic lymphocytic leukemia. 5.vitamin B 12 deficiency 6.  Recent admission in the hospital in June of 2016 with hemolytic anemia and hemoglobin of 5.5 patient received blood transfusion was started on steroid 7.  Patient was started on rituximab (October 20, 2014     Chronic lymphocytic leukemia ([place])   08/01/2008 Initial Diagnosis Chronic lymphocytic leukemia    PAST MEDICAL HISTORY: Past Medical History  Diagnosis Date  . CLL (chronic lymphoblastic leukemia) 2006    Rituxan treatment 2006  . Atrial septal aneurysm 2009    Insignificant, no, January, 2009  . Infection of PEG site ([place])     cellulitis.Marland KitchenResolved  . Vertebral compression fracture (HCC)     multiple levels  . Herpes zoster     right chest  . Aortic insufficiency 2009    mild, Mild, echo, 2012  . Diastolic dysfunction     mild  . Chest pain     EF 55%, echo, October, 2009, possible inferior and posterior borderline hypokinesis, patient has never  had cardiac catheterization  . Edema     EF 55%, echo, October, 2009, possible borderline inferior and posterior hypokinesis... heart catheterization has never been done(July 10, 2009)  . Myositis     Caused pulmonary insufficiency with muscle weakness in the past / Dr.Willis-neurology, Imuran and prednisone  . Calculus of kidney   . Inguinal hernia without mention of obstruction or gangrene, unilateral or unspecified, (  not specified as recurrent)     right   . Unspecified gastritis and gastroduodenitis without mention of hemorrhage   . Abdominal pain, epigastric   . Nonspecific elevation of levels of transaminase or lactic acid dehydrogenase (LDH)   .  Feeding difficulties and mismanagement   . Edema of male genital organs     Resolved, occurred during illness related to myositis  . Swelling of arm     left.Marland KitchenMarland KitchenResolved.... no DVT  . LFT elevation     Related to Imuran in the past... dose was adjusted  . Dyslipidemia     statins not used due to LFT abnormalities  . Shortness of breath     Breathing abnormalities related to muscle weakness from myositis  . RBBB (right bundle branch block)     Old  . Bradycardia     Sinus bradycardia, old  . Ejection fraction     EF 60%, echo, October, 2012  . Mitral regurgitation     Mild / moderate, echo, 2012  . Right ventricular dysfunction     Mild, echo, 2012  . Febrile illness     May, 2013, question sinusitis  . Polymyositis ([place]) 11/26/2012  . Hemolytic anemia ([place])   . Cellulitis   . Chronic diastolic CHF (congestive heart failure) ([place]) 03/17/2015    PAST SURGICAL HISTORY: Past Surgical History  Procedure Laterality Date  . Splenectomy    . Hernia repair      left side  . Peg placement  06/2007  . Peg tube removal  12/2007  . Kyphosis surgery      multilevel vertebral  . Odontoid fracture surgery      anterior screw fixation  . Nasal sinus surgery      x2  . Cataract extraction      bi lateral    FAMILY HISTORY Family History  Problem Relation Age of Onset  . Breast cancer Sister   . Colon cancer Brother   . Prostate cancer Brother   . Stroke Mother   . Hypertension Mother   . Emphysema Father   . Diabetes Grandchild   . Heart attack Neg Hx   . Cancer Brother   . Cancer Sister   . Cancer Daughter     GYNECOLOGIC HISTORY:  No LMP for male patient.     ADVANCED DIRECTIVES:    HEALTH MAINTENANCE: Social History  Substance Use Topics  . Smoking status: Former Smoker -- 0.50 packs/day for 31 years    Types: Cigarettes    Quit date: 06/29/1968  . Smokeless tobacco: Former Systems developer    Types: Chew  . Alcohol Use: No     Comment: rarely     Allergies    Allergen Reactions  . Sulfonamide Derivatives     Current Outpatient Prescriptions  Medication Sig Dispense Refill  . albuterol (PROVENTIL) (2.5 MG/3ML) 0.083% nebulizer solution Take 3 mLs (2.5 mg total) by nebulization every 6 (six) hours as needed for wheezing or shortness of breath. 75 mL 12  . calcium-vitamin D (CALCIUM 500+D) 500-200 MG-UNIT per tablet Take 1 tablet by mouth 2 (two) times daily.      . Cholecalciferol (VITAMIN D3) 1000 UNITS tablet Take 1,000 Units by mouth daily.      . cyanocobalamin (,VITAMIN B-12,) 1000 MCG/ML injection Inject 1 mL (1,000 mcg total) into the muscle every 14 (fourteen) days. At the first of the month 10 mL 3  . ferrous sulfate 325 (65 FE) MG tablet Take 325 mg by  mouth daily with breakfast.      . fexofenadine (ALLEGRA) 180 MG tablet     . fish oil-omega-3 fatty acids 1000 MG capsule Take 1 g by mouth daily at 12 noon.     . folic acid (FOLVITE) 1 MG tablet Take 4 tablets (4 mg total) by mouth daily. 120 tablet 0  . furosemide (LASIX) 80 MG tablet Take 1 tablet (80 mg total) by mouth daily. 90 tablet 3  . HYDROcodone-acetaminophen (NORCO/VICODIN) 5-325 MG tablet Take 1 tablet by mouth 3 (three) times daily. 30 tablet 0  . montelukast (SINGULAIR) 10 MG tablet Take 1 tablet (10 mg total) by mouth at bedtime. 1 tablet 11  . Multiple Vitamin (MULTIVITAMIN) capsule Take 1 capsule by mouth daily.      . mycophenolate (CELLCEPT) 500 MG tablet Take 1 tablet (500 mg total) by mouth daily. 90 tablet 3  . nystatin cream (MYCOSTATIN) APPLY TOPICALLY TWICE DAILY 30 g 0  . pantoprazole (PROTONIX) 40 MG tablet Take 1 tablet (40 mg total) by mouth daily at 12 noon. 90 tablet 1  . potassium chloride SA (K-DUR,KLOR-CON) 20 MEQ tablet Take 1 tablet (20 mEq total) by mouth 2 (two) times daily. 180 tablet 3  . predniSONE (DELTASONE) 10 MG tablet Take as directed (Patient taking differently: 5 mg. Take as directed- Dr Oliva Bustard) 30 tablet 3  . PROAIR HFA 108 (90 BASE)  MCG/ACT inhaler Inhale 2 puffs into the lungs 4 (four) times daily.   11  . Resource Beneprotein PACK Take 3g three times a day    . temazepam (RESTORIL) 30 MG capsule Take 1 capsule (30 mg total) by mouth at bedtime. 30 capsule 5  . citalopram (CELEXA) 40 MG tablet Take 40 mg by mouth daily.  1  . levofloxacin (LEVAQUIN) 500 MG tablet Take 1 tablet (500 mg total) by mouth daily. 7 tablet 0   No current facility-administered medications for this visit.    OBJECTIVE: BP 123/70 mmHg  Pulse 69  Temp(Src) 96.9 F (36.1 C) (Tympanic)  Resp 18  Wt 131 lb 13.4 oz (59.8 kg)   Body mass index is 21.94 kg/(m^2).    ECOG FS:0 - Asymptomatic  General: Well-developed, well-nourished, no acute distress. Eyes: Pink conjunctiva, anicteric sclera. HEENT: Normocephalic, moist mucous membranes, clear oropharnyx. Lungs: Clear to auscultation bilaterally. Heart: Regular rate and rhythm. No rubs, murmurs, or gallops. Abdomen: Soft, nontender, nondistended. No organomegaly noted, normoactive bowel sounds. Musculoskeletal: No edema, cyanosis, or clubbing. Neuro: Alert, answering all questions appropriately. Cranial nerves grossly intact. Skin: No rashes or petechiae noted. Psych: Normal affect.   LAB RESULTS:  Appointment on 06/04/2015  Component Date Value Ref Range Status  . WBC 06/04/2015 13.3* 3.8 - 10.6 K/uL Final  . RBC 06/04/2015 4.09* 4.40 - 5.90 MIL/uL Final  . Hemoglobin 06/04/2015 13.5  13.0 - 18.0 g/dL Final  . HCT 06/04/2015 40.2  40.0 - 52.0 % Final  . MCV 06/04/2015 98.5  80.0 - 100.0 fL Final  . MCH 06/04/2015 33.1  26.0 - 34.0 pg Final  . MCHC 06/04/2015 33.6  32.0 - 36.0 g/dL Final  . RDW 06/04/2015 14.2  11.5 - 14.5 % Final  . Platelets 06/04/2015 316  150 - 440 K/uL Final  . Neutrophils Relative % 06/04/2015 56   Final  . Neutro Abs 06/04/2015 7.5* 1.4 - 6.5 K/uL Final  . Lymphocytes Relative 06/04/2015 32   Final  . Lymphs Abs 06/04/2015 4.3* 1.0 - 3.6 K/uL Final  .  Monocytes Relative 06/04/2015 10   Final  . Monocytes Absolute 06/04/2015 1.3* 0.2 - 1.0 K/uL Final  . Eosinophils Relative 06/04/2015 1   Final  . Eosinophils Absolute 06/04/2015 0.1  0 - 0.7 K/uL Final  . Basophils Relative 06/04/2015 1   Final  . Basophils Absolute 06/04/2015 0.1  0 - 0.1 K/uL Final    STUDIES: No results found.  ASSESSMENT:  CLL. Hemolytic anemia.  PLAN:   1. CLL. Patient was previously on rituximab, last administration was in November 2016. Patient is currently feeling very well with no reports or complaints of any acute issues. 2. Hemolytic anemia. Patient is currently on prednisone 5 mg twice per week and hemoglobin is 13.5. Advised patient to discontinue prednisone use and we will recheck his labs in approximately 4-6 weeks. 3. Upper respiratory infection. Patient has had persistent cough over the last 2 months and was treated with Levaquin with improvement. Just recently has he started to have tan sputum production and is using Mucinex over-the-counter and albuterol SVN. We will start 7 day course of Levaquin to ensure improvement. He has had no fever or chills.  We'll continue with routine follow-up in approximately 4-6 weeks.  Patient expressed understanding and was in agreement with this plan. He also understands that He can call clinic at any time with any questions, concerns, or complaints.   Dr. Oliva Bustard was available for consultation and review of plan of care for this patient.   Evlyn Kanner, NP   06/04/2015 10:40 AM

## 2015-06-11 ENCOUNTER — Other Ambulatory Visit: Payer: Self-pay

## 2015-06-12 ENCOUNTER — Other Ambulatory Visit: Payer: Self-pay | Admitting: Family Medicine

## 2015-06-18 ENCOUNTER — Ambulatory Visit (INDEPENDENT_AMBULATORY_CARE_PROVIDER_SITE_OTHER): Payer: Medicare Other | Admitting: Neurology

## 2015-06-18 ENCOUNTER — Encounter: Payer: Self-pay | Admitting: Neurology

## 2015-06-18 VITALS — BP 110/62 | HR 72 | Resp 20 | Ht 65.0 in | Wt 124.0 lb

## 2015-06-18 DIAGNOSIS — M3320 Polymyositis, organ involvement unspecified: Secondary | ICD-10-CM | POA: Diagnosis not present

## 2015-06-18 MED ORDER — MYCOPHENOLATE MOFETIL 500 MG PO TABS: 500.0000 mg | ORAL_TABLET | Freq: Every day | ORAL | Status: AC

## 2015-06-18 NOTE — Patient Instructions (Signed)
Potassium (K) Test WHY AM I HAVING THIS TEST? The potassium test is performed to determine how much potassium you have in your blood. Potassium is an important nutrient that helps your muscles and nerves to function normally. It also helps to maintain a stable acid-base balance in your bloodstream. Most of the body's potassium is inside of cells, and only a very small amount is in the blood. Because the amount of potassium in the blood is so small, minor changes can have big effects. Potassium blood levels are affected by many factors, including kidney function, the presence of certain hormones in your blood, and how much salt (sodium) leaves your body through urination. Your health care provider may want you to have this test if he or she suspects any problems with these factors. The test may also be done as a part of routine blood work, to help diagnose the cause of a serious illness, or to monitor treatment for conditions such as heart disease or heart failure. WHAT KIND OF SAMPLE IS TAKEN? A blood sample is required for this test. It is usually collected by inserting a needle into a vein or by sticking a finger with a small needle. HOW DO I PREPARE FOR THE TEST? There is no preparation required for this test. WHAT DO THE RESULTS MEAN? Your results will be compared with a reference range of values for this test. The reference ranges for the potassium test are as follows:  Adult or elderly: 3.5-5.0 mEq/L or 3.5-5.0 mmol/L (SI units).  Child: 3.4-4.7 mEq/L.  Infant: 4.1-5.3 mEq/L.  Newborn: 3.9-5.9 mEq/L. Test results that are higher than normal can indicate a number of health conditions. These may include:  Too much dietary intake of potassium-rich foods.  Too much IV intake of potassium-rich solutions.  Kidney failure.  Addison disease.  Decreased production of the aldosterone hormone by the kidneys (hypoaldosteronism).  Crush injury to tissues.  The breakdown of red blood cells in  the spleen (hemolysis).  Transfusion of broken-down (hemolyzed) blood cells.  Infection.  A condition in which your blood is too acidic (acidosis).  Dehydration. Test results that are lower than normal can also indicate a number of health conditions. These may include:  Not enough dietary intake of potassium-rich foods.  Not enough IV intake of potassium-rich solutions.  Complications from a burn.  Conditions that cause excessive or prolonged diarrhea or vomiting.  Use of diuretics. These are medicines that encourage extra fluid loss from your body through urination.  Too much production of aldosterone hormone by the kidneys (hyperaldosteronism).  Cushing syndrome.  Kidney disease.  Consuming too much licorice. When licorice is eaten in large amounts, it functions like the hormone aldosterone.  A condition in which your blood is not acidic enough (alkalosis).  Use of insulin.  Taking in too much sugar (glucose) as a treatment for a low blood glucose level.  Excess fluid in the abdomen (ascites).  Narrowing or partial blockage of blood vessels to the kidney (renal artery stenosis).  Cystic fibrosis.  Complications from trauma.  Surgery. Reference values may vary among different labs and hospitals. Talk to your health care provider to discuss whether your results mean something significant for you. It is your responsibility to obtain your test results. Ask the lab or department performing the test when and how you will get your results. Talk with your health care provider if you have any questions about your results.   This information is not intended to replace advice given to  you by your health care provider. Make sure you discuss any questions you have with your health care provider.   Document Released: 04/30/2004 Document Revised: 04/18/2014 Document Reviewed: 08/21/2013 Elsevier Interactive Patient Education 2016 [employer]. Polymyositis Polymyositis is a  disease that causes inflammation and weakness of your muscles. It can also affect your skin tissues. Polymyositis is also known as idiopathic inflammatory myopathy. It is often associated with diseases in which the body attacks its own cells (autoimmune diseases).  CAUSES The cause of polymyositis is not known. RISK FACTORS  Gender. Polymyositis is more common in women.  Age. The disease is more common in adults. SIGNS AND SYMPTOMS Symptoms may include:  Weakness in your:  Neck.  Shoulder.  Upper arm.  Hip.  Thigh.  Having difficulty:  Rising from a seated position.  Climbing stairs.  Lifting objects.  Reaching overhead.  Swallowing (dysphagia).  Sore muscles.  Fatigue.  Fever.  Hard bumps under your skin.  Unexplained weight loss. DIAGNOSIS  To diagnose polymyositis, your health care provider will take your medical history and do a physical exam. Various tests may also be done, including:   Blood tests.  MRI.  A test to check electrical activity of the muscle (electromyography).  An exam of a sample of muscle tissue (biopsy). TREATMENT There is no cure for polymyositis, but treatment can improve muscle strength and function. The earlier treatment that is started, the more effective it is. Treatment may include:  Corticosteroid medicines. These help with inflammation.  Immunoglobulin medicines. These contain healthy antibodies from blood donors.  Immunosuppressive medicines.  Physical therapy.  Speech and language therapy. HOME CARE INSTRUCTIONS  Take medicines only as directed by your health care provider.  Work closely with all of your therapists, if necessary.  Protect your skin from the sun. Wear sunscreen or protective clothes when you are outside.  Stay active. An exercise routine can help you to build and maintain muscle strength. Talk with your health care provider or your physical therapist before you start any exercise  program.  Rest when you are tired.  Keep all follow-up visits as directed by your health care provider. This is important. SEEK MEDICAL CARE IF: You develop new or worsening muscle weakness. SEEK IMMEDIATE MEDICAL CARE IF:  You have trouble swallowing or speaking.  You have shortness of breath.   This information is not intended to replace advice given to you by your health care provider. Make sure you discuss any questions you have with your health care provider.   Document Released: 03/18/2002 Document Revised: 04/18/2014 Document Reviewed: 09/10/2013 Elsevier Interactive Patient Education [employer].

## 2015-06-18 NOTE — Progress Notes (Signed)
Reason for visit: Polymyositis  [name] [name] is an 82 y.o. male  History of present illness:  [name] [name] is an 82 year old right-handed white male with a history of polymyositis that has been under excellent control. He has been able to come off of his prednisone, he is taking 1 tablet of CellCept daily. He does report some slight increase in fatigue as time has gone on. He does have some shortness of breath with exertion. He denies any true weakness of the extremities. He denies any falls. He denies any new medical issues that have come up since last seen. He is followed for CLL, he has had blood work done recently in February 2017. Blood work remained stable.  Past Medical History  Diagnosis Date  . CLL (chronic lymphoblastic leukemia) 2006    Rituxan treatment 2006  . Atrial septal aneurysm 2009    Insignificant, no, January, 2009  . Infection of PEG site ([facility])     cellulitis.Marland KitchenResolved  . Vertebral compression fracture ([facility])     multiple levels  . Herpes zoster     right chest  . Aortic insufficiency 2009    mild, Mild, echo, 2012  . Diastolic dysfunction     mild  . Chest pain     EF 55%, echo, October, 2009, possible inferior and posterior borderline hypokinesis, patient has never  had cardiac catheterization  . Edema     EF 55%, echo, October, 2009, possible borderline inferior and posterior hypokinesis... heart catheterization has never been done(July 10, 2009)  . Myositis     Caused pulmonary insufficiency with muscle weakness in the past / Dr.Arnaldo Heffron-neurology, Imuran and prednisone  . Calculus of kidney   . Inguinal hernia without mention of obstruction or gangrene, unilateral or unspecified, (not specified as recurrent)     right   . Unspecified gastritis and gastroduodenitis without mention of hemorrhage   . Abdominal pain, epigastric   . Nonspecific elevation of levels of transaminase or lactic acid dehydrogenase (LDH)   . Feeding difficulties and mismanagement     . Edema of male genital organs     Resolved, occurred during illness related to myositis  . Swelling of arm     left.Marland KitchenMarland KitchenResolved.... no DVT  . LFT elevation     Related to Imuran in the past... dose was adjusted  . Dyslipidemia     statins not used due to LFT abnormalities  . Shortness of breath     Breathing abnormalities related to muscle weakness from myositis  . RBBB (right bundle branch block)     Old  . Bradycardia     Sinus bradycardia, old  . Ejection fraction     EF 60%, echo, October, 2012  . Mitral regurgitation     Mild / moderate, echo, 2012  . Right ventricular dysfunction     Mild, echo, 2012  . Febrile illness     May, 2013, question sinusitis  . Polymyositis ([facility]) 11/26/2012  . Hemolytic anemia ([place])   . Cellulitis   . Chronic diastolic CHF (congestive heart failure) ([facility]) 03/17/2015    Past Surgical History  Procedure Laterality Date  . Splenectomy    . Hernia repair      left side  . Peg placement  06/2007  . Peg tube removal  12/2007  . Kyphosis surgery      multilevel vertebral  . Odontoid fracture surgery      anterior screw fixation  . Nasal sinus surgery  x2  . Cataract extraction      bi lateral    Family History  Problem Relation Age of Onset  . Breast cancer Sister   . Colon cancer Brother   . Prostate cancer Brother   . Stroke Mother   . Hypertension Mother   . Emphysema Father   . Diabetes Grandchild   . Heart attack Neg Hx   . Cancer Brother   . Cancer Sister   . Cancer Daughter     Social history:  reports that he quit smoking about 47 years ago. His smoking use included Cigarettes. He has a 15.5 pack-year smoking history. He has quit using smokeless tobacco. His smokeless tobacco use included Chew. He reports that he does not drink alcohol. His drug history is not on file.    Allergies  Allergen Reactions  . Sulfonamide Derivatives     Medications:  Prior to Admission medications   Medication Sig Start Date End  Date Taking? Authorizing Provider  albuterol (PROVENTIL) (2.5 MG/3ML) 0.083% nebulizer solution Take 3 mLs (2.5 mg total) by nebulization every 6 (six) hours as needed for wheezing or shortness of breath. 01/06/15   Juline Patch, MD  calcium-vitamin D (CALCIUM 500+D) 500-200 MG-UNIT per tablet Take 1 tablet by mouth 2 (two) times daily.      Historical Provider, MD  Cholecalciferol (VITAMIN D3) 1000 UNITS tablet Take 1,000 Units by mouth daily.      Historical Provider, MD  citalopram (CELEXA) 40 MG tablet Take 40 mg by mouth daily. 05/14/15   Historical Provider, MD  citalopram (CELEXA) 40 MG tablet TAKE 1 TABLET (40 MG TOTAL) BY MOUTH DAILY. 06/12/15   Juline Patch, MD  cyanocobalamin (,VITAMIN B-12,) 1000 MCG/ML injection Inject 1 mL (1,000 mcg total) into the muscle every 14 (fourteen) days. At the first of the month 06/04/15   Evlyn Kanner, NP  ferrous sulfate 325 (65 FE) MG tablet Take 325 mg by mouth daily with breakfast.      Historical Provider, MD  fexofenadine (ALLEGRA) 180 MG tablet  04/27/15   Historical Provider, MD  fish oil-omega-3 fatty acids 1000 MG capsule Take 1 g by mouth daily at 12 noon.     Historical Provider, MD  folic acid (FOLVITE) 1 MG tablet Take 4 tablets (4 mg total) by mouth daily. 10/14/14   Shanker Kristeen Mans, MD  furosemide (LASIX) 80 MG tablet Take 1 tablet (80 mg total) by mouth daily. 03/17/15   Minna Merritts, MD  HYDROcodone-acetaminophen (NORCO/VICODIN) 5-325 MG tablet Take 1 tablet by mouth 3 (three) times daily. 05/11/15   Juline Patch, MD  levofloxacin (LEVAQUIN) 500 MG tablet Take 1 tablet (500 mg total) by mouth daily. 06/04/15   Evlyn Kanner, NP  montelukast (SINGULAIR) 10 MG tablet Take 1 tablet (10 mg total) by mouth at bedtime. 05/11/15   Juline Patch, MD  Multiple Vitamin (MULTIVITAMIN) capsule Take 1 capsule by mouth daily.      Historical Provider, MD  mycophenolate (CELLCEPT) 500 MG tablet Take 1 tablet (500 mg total) by mouth daily. 09/19/14    Kathrynn Ducking, MD  nystatin cream (MYCOSTATIN) APPLY TOPICALLY TWICE DAILY 01/12/15   Juline Patch, MD  pantoprazole (PROTONIX) 40 MG tablet Take 1 tablet (40 mg total) by mouth daily at 12 noon. 12/25/14   Forest Gleason, MD  potassium chloride SA (K-DUR,KLOR-CON) 20 MEQ tablet Take 1 tablet (20 mEq total) by mouth 2 (two) times daily. 03/17/15  Minna Merritts, MD  PROAIR HFA 108 (90 BASE) MCG/ACT inhaler Inhale 2 puffs into the lungs 4 (four) times daily.  08/21/14   Historical Provider, MD  Resource Beneprotein PACK Take 3g three times a day    Historical Provider, MD  temazepam (RESTORIL) 30 MG capsule Take 1 capsule (30 mg total) by mouth at bedtime. 05/11/15   Juline Patch, MD    ROS:  Out of a complete 14 system review of symptoms, the patient complains only of the following symptoms, and all other reviewed systems are negative.  Fatigue Ringing in the ears Shortness of breath Joint pain  Blood pressure 110/62, pulse 72, resp. rate 20, height 5\' 5"  (1.651 m), weight 124 lb (56.246 kg).  Physical Exam  General: The patient is alert and cooperative at the time of the examination.  Skin: No significant peripheral edema is noted.   Neurologic Exam  Mental status: The patient is alert and oriented x 3 at the time of the examination. The patient has apparent normal recent and remote memory, with an apparently normal attention span and concentration ability.   Cranial nerves: Facial symmetry is present. Speech is normal, no aphasia or dysarthria is noted. Extraocular movements are full. Visual fields are full.  Motor: The patient has good strength in all 4 extremities, with exception of some slight weakness with hip flexion bilaterally. The patient is able to rise from a seated position with arms crossed.  Sensory examination: Soft touch sensation is symmetric on the face, arms, and legs.  Coordination: The patient has good finger-nose-finger and heel-to-shin  bilaterally.  Gait and station: The patient has a normal gait. Tandem gait is slightly unsteady. Romberg is negative. No drift is seen.  Reflexes: Deep tendon reflexes are symmetric, but are depressed.   Assessment/Plan:  1. Polymyositis  The patient doing well at this time. We will continue the CellCept at the current dose, he will follow-up in 6 months. He has had blood work done recently. A prescription for the CellCept was called in.  Jill Alexanders MD 06/18/2015 11:14 AM  [facility] [address] [place], [place] [address]  Phone [phone] Fax [phone]

## 2015-06-25 ENCOUNTER — Other Ambulatory Visit: Payer: Self-pay | Admitting: Oncology

## 2015-06-25 ENCOUNTER — Other Ambulatory Visit: Payer: Medicare Other

## 2015-06-25 ENCOUNTER — Ambulatory Visit: Payer: Medicare Other | Admitting: Oncology

## 2015-06-27 ENCOUNTER — Inpatient Hospital Stay (HOSPITAL_COMMUNITY)
Admission: EM | Admit: 2015-06-27 | Discharge: 2015-07-03 | DRG: 175 | Disposition: A | Discharge status: Home Health | Payer: Medicare Other | Attending: Family Medicine | Admitting: Family Medicine

## 2015-06-27 ENCOUNTER — Encounter (HOSPITAL_COMMUNITY): Payer: Self-pay

## 2015-06-27 ENCOUNTER — Emergency Department (HOSPITAL_COMMUNITY): Payer: Medicare Other

## 2015-06-27 DIAGNOSIS — I2699 Other pulmonary embolism without acute cor pulmonale: Principal | ICD-10-CM | POA: Diagnosis present

## 2015-06-27 DIAGNOSIS — R0600 Dyspnea, unspecified: Secondary | ICD-10-CM | POA: Insufficient documentation

## 2015-06-27 DIAGNOSIS — D539 Nutritional anemia, unspecified: Secondary | ICD-10-CM | POA: Diagnosis present

## 2015-06-27 DIAGNOSIS — I5032 Chronic diastolic (congestive) heart failure: Secondary | ICD-10-CM | POA: Diagnosis present

## 2015-06-27 DIAGNOSIS — J189 Pneumonia, unspecified organism: Secondary | ICD-10-CM

## 2015-06-27 DIAGNOSIS — E785 Hyperlipidemia, unspecified: Secondary | ICD-10-CM | POA: Diagnosis present

## 2015-06-27 DIAGNOSIS — R0602 Shortness of breath: Secondary | ICD-10-CM | POA: Diagnosis not present

## 2015-06-27 DIAGNOSIS — Z9841 Cataract extraction status, right eye: Secondary | ICD-10-CM

## 2015-06-27 DIAGNOSIS — J9601 Acute respiratory failure with hypoxia: Secondary | ICD-10-CM | POA: Diagnosis present

## 2015-06-27 DIAGNOSIS — M3320 Polymyositis, organ involvement unspecified: Secondary | ICD-10-CM | POA: Diagnosis present

## 2015-06-27 DIAGNOSIS — I4510 Unspecified right bundle-branch block: Secondary | ICD-10-CM | POA: Diagnosis present

## 2015-06-27 DIAGNOSIS — K219 Gastro-esophageal reflux disease without esophagitis: Secondary | ICD-10-CM | POA: Diagnosis present

## 2015-06-27 DIAGNOSIS — J449 Chronic obstructive pulmonary disease, unspecified: Secondary | ICD-10-CM | POA: Diagnosis present

## 2015-06-27 DIAGNOSIS — Z9842 Cataract extraction status, left eye: Secondary | ICD-10-CM

## 2015-06-27 DIAGNOSIS — D649 Anemia, unspecified: Secondary | ICD-10-CM | POA: Diagnosis present

## 2015-06-27 DIAGNOSIS — R7881 Bacteremia: Secondary | ICD-10-CM | POA: Diagnosis present

## 2015-06-27 DIAGNOSIS — G4700 Insomnia, unspecified: Secondary | ICD-10-CM | POA: Diagnosis present

## 2015-06-27 DIAGNOSIS — Z9081 Acquired absence of spleen: Secondary | ICD-10-CM

## 2015-06-27 DIAGNOSIS — I10 Essential (primary) hypertension: Secondary | ICD-10-CM | POA: Diagnosis present

## 2015-06-27 DIAGNOSIS — C9110 Chronic lymphocytic leukemia of B-cell type not having achieved remission: Secondary | ICD-10-CM | POA: Diagnosis present

## 2015-06-27 DIAGNOSIS — D591 Other autoimmune hemolytic anemias: Secondary | ICD-10-CM | POA: Diagnosis present

## 2015-06-27 DIAGNOSIS — I340 Nonrheumatic mitral (valve) insufficiency: Secondary | ICD-10-CM | POA: Diagnosis present

## 2015-06-27 DIAGNOSIS — I76 Septic arterial embolism: Secondary | ICD-10-CM | POA: Diagnosis present

## 2015-06-27 DIAGNOSIS — I351 Nonrheumatic aortic (valve) insufficiency: Secondary | ICD-10-CM | POA: Diagnosis present

## 2015-06-27 DIAGNOSIS — B9689 Other specified bacterial agents as the cause of diseases classified elsewhere: Secondary | ICD-10-CM | POA: Diagnosis present

## 2015-06-27 DIAGNOSIS — I2510 Atherosclerotic heart disease of native coronary artery without angina pectoris: Secondary | ICD-10-CM | POA: Diagnosis present

## 2015-06-27 DIAGNOSIS — F329 Major depressive disorder, single episode, unspecified: Secondary | ICD-10-CM | POA: Diagnosis present

## 2015-06-27 DIAGNOSIS — I82409 Acute embolism and thrombosis of unspecified deep veins of unspecified lower extremity: Secondary | ICD-10-CM

## 2015-06-27 DIAGNOSIS — I7300 Raynaud's syndrome without gangrene: Secondary | ICD-10-CM | POA: Diagnosis present

## 2015-06-27 DIAGNOSIS — R001 Bradycardia, unspecified: Secondary | ICD-10-CM | POA: Diagnosis present

## 2015-06-27 DIAGNOSIS — R0902 Hypoxemia: Secondary | ICD-10-CM | POA: Insufficient documentation

## 2015-06-27 DIAGNOSIS — Z882 Allergy status to sulfonamides status: Secondary | ICD-10-CM

## 2015-06-27 DIAGNOSIS — C9111 Chronic lymphocytic leukemia of B-cell type in remission: Secondary | ICD-10-CM | POA: Diagnosis present

## 2015-06-27 DIAGNOSIS — I5033 Acute on chronic diastolic (congestive) heart failure: Secondary | ICD-10-CM | POA: Diagnosis present

## 2015-06-27 DIAGNOSIS — Z87891 Personal history of nicotine dependence: Secondary | ICD-10-CM

## 2015-06-27 DIAGNOSIS — M810 Age-related osteoporosis without current pathological fracture: Secondary | ICD-10-CM | POA: Diagnosis present

## 2015-06-27 DIAGNOSIS — Z79899 Other long term (current) drug therapy: Secondary | ICD-10-CM

## 2015-06-27 LAB — CBC WITH DIFFERENTIAL/PLATELET

## 2015-06-27 LAB — DIFFERENTIAL
BASOS ABS: 0 10*3/uL (ref 0.0–0.1)
Basophils Relative: 0 %
EOS PCT: 1 %
Eosinophils Absolute: 0.2 10*3/uL (ref 0.0–0.7)
LYMPHS ABS: 6.7 10*3/uL — AB (ref 0.7–4.0)
Lymphocytes Relative: 36 %
MONO ABS: 3.3 10*3/uL — AB (ref 0.1–1.0)
MONOS PCT: 18 %
NEUTROS PCT: 45 %
Neutro Abs: 8.3 10*3/uL — ABNORMAL HIGH (ref 1.7–7.7)

## 2015-06-27 LAB — CBC
HCT: 30 % — ABNORMAL LOW (ref 39.0–52.0)
Hemoglobin: 9.5 g/dL — ABNORMAL LOW (ref 13.0–17.0)
MCH: 34.3 pg — ABNORMAL HIGH (ref 26.0–34.0)
MCHC: 31.7 g/dL (ref 30.0–36.0)
MCV: 108.3 fL — AB (ref 78.0–100.0)
PLATELETS: 315 10*3/uL (ref 150–400)
RBC: 2.77 MIL/uL — AB (ref 4.22–5.81)
RDW: 20.2 % — AB (ref 11.5–15.5)
WBC: 18.5 10*3/uL — AB (ref 4.0–10.5)

## 2015-06-27 LAB — BASIC METABOLIC PANEL
Anion gap: 12 (ref 5–15)
BUN: 24 mg/dL — AB (ref 6–20)
CALCIUM: 9.5 mg/dL (ref 8.9–10.3)
CHLORIDE: 102 mmol/L (ref 101–111)
CO2: 25 mmol/L (ref 22–32)
CREATININE: 0.97 mg/dL (ref 0.61–1.24)
Glucose, Bld: 108 mg/dL — ABNORMAL HIGH (ref 65–99)
Potassium: 4.5 mmol/L (ref 3.5–5.1)
SODIUM: 139 mmol/L (ref 135–145)

## 2015-06-27 LAB — I-STAT TROPONIN, ED: TROPONIN I, POC: 0.01 ng/mL (ref 0.00–0.08)

## 2015-06-27 NOTE — ED Notes (Signed)
Pt stood on the side of the bed to urinate, when doing this the pt was able to maintain his oxygen saturation level. Once the pt was finished urinating he sat on the side of the bed, the pts oxygen saturation level dropped to 89%, the pt started to breath a little more heavily than he had been. The pt placed his feet back up on the bed and stated he felt a little short of breath after standing. The pt then focused on his breathing and was able to increase his oxygen saturation level.

## 2015-06-27 NOTE — ED Provider Notes (Signed)
CSN: [id number]     Arrival date & time 06/27/15  2022 History   First MD Initiated Contact with Patient 06/27/15 2103     Chief Complaint  Patient presents with  . Shortness of Breath  . Fatigue     (Consider location/radiation/quality/duration/timing/severity/associated sxs/prior Treatment) Patient is a 82 y.o. male presenting with shortness of breath. The history is provided by the patient.  Shortness of Breath Associated symptoms: no abdominal pain, no chest pain, no cough, no fever, no headaches and no rash    patient with feeling shortness of breath for 4 days gotten worse the last 2 days. Yesterday had several bowel movements of diarrhea about 7-10 no blood in it. Feeling fatigued. But has exertional shortness of breath does not normally have a prominent home his regular doctor came out to visit the house and they got a pulse ox of 86%. Patient has some known heart valve problems as well as known diastolic dysfunction. Patient denies any chest pain. No known fevers.  Past Medical History  Diagnosis Date  . CLL (chronic lymphoblastic leukemia) 2006    Rituxan treatment 2006  . Atrial septal aneurysm 2009    Insignificant, no, January, 2009  . Infection of PEG site ([place])     cellulitis.Marland KitchenResolved  . Vertebral compression fracture (HCC)     multiple levels  . Herpes zoster     right chest  . Aortic insufficiency 2009    mild, Mild, echo, 2012  . Diastolic dysfunction     mild  . Chest pain     EF 55%, echo, October, 2009, possible inferior and posterior borderline hypokinesis, patient has never  had cardiac catheterization  . Edema     EF 55%, echo, October, 2009, possible borderline inferior and posterior hypokinesis... heart catheterization has never been done(July 10, 2009)  . Myositis     Caused pulmonary insufficiency with muscle weakness in the past / Dr.Willis-neurology, Imuran and prednisone  . Calculus of kidney   . Inguinal hernia without mention of obstruction or  gangrene, unilateral or unspecified, (not specified as recurrent)     right   . Unspecified gastritis and gastroduodenitis without mention of hemorrhage   . Abdominal pain, epigastric   . Nonspecific elevation of levels of transaminase or lactic acid dehydrogenase (LDH)   . Feeding difficulties and mismanagement   . Edema of male genital organs     Resolved, occurred during illness related to myositis  . Swelling of arm     left.Marland KitchenMarland KitchenResolved.... no DVT  . LFT elevation     Related to Imuran in the past... dose was adjusted  . Dyslipidemia     statins not used due to LFT abnormalities  . Shortness of breath     Breathing abnormalities related to muscle weakness from myositis  . RBBB (right bundle branch block)     Old  . Bradycardia     Sinus bradycardia, old  . Ejection fraction     EF 60%, echo, October, 2012  . Mitral regurgitation     Mild / moderate, echo, 2012  . Right ventricular dysfunction     Mild, echo, 2012  . Febrile illness     May, 2013, question sinusitis  . Polymyositis ([place]) 11/26/2012  . Hemolytic anemia ([place])   . Cellulitis   . Chronic diastolic CHF (congestive heart failure) ([place]) 03/17/2015   Past Surgical History  Procedure Laterality Date  . Splenectomy    . Hernia repair  left side  . Peg placement  06/2007  . Peg tube removal  12/2007  . Kyphosis surgery      multilevel vertebral  . Odontoid fracture surgery      anterior screw fixation  . Nasal sinus surgery      x2  . Cataract extraction      bi lateral   Family History  Problem Relation Age of Onset  . Breast cancer Sister   . Colon cancer Brother   . Prostate cancer Brother   . Stroke Mother   . Hypertension Mother   . Emphysema Father   . Diabetes Grandchild   . Heart attack Neg Hx   . Cancer Brother   . Cancer Sister   . Cancer Daughter    Social History  Substance Use Topics  . Smoking status: Former Smoker -- 0.50 packs/day for 31 years    Types: Cigarettes    Quit  date: 06/29/1968  . Smokeless tobacco: Former Systems developer    Types: Chew  . Alcohol Use: No     Comment: rarely    Review of Systems  Constitutional: Negative for fever.  HENT: Negative for congestion.   Eyes: Negative for redness.  Respiratory: Negative for cough and shortness of breath.   Cardiovascular: Negative for chest pain.  Gastrointestinal: Negative for abdominal pain.  Genitourinary: Negative for dysuria.  Musculoskeletal: Negative for back pain.  Skin: Negative for rash.  Neurological: Negative for headaches.  Hematological: Does not bruise/bleed easily.  Psychiatric/Behavioral: Negative for confusion.      Allergies  Sulfonamide derivatives  Home Medications   Prior to Admission medications   Medication Sig Start Date End Date Taking? Authorizing Provider  albuterol (PROVENTIL) (2.5 MG/3ML) 0.083% nebulizer solution Take 3 mLs (2.5 mg total) by nebulization every 6 (six) hours as needed for wheezing or shortness of breath. 01/06/15  Yes Juline Patch, MD  calcium-vitamin D (CALCIUM 500+D) 500-200 MG-UNIT per tablet Take 1 tablet by mouth 2 (two) times daily.     Yes Historical Provider, MD  Cholecalciferol (VITAMIN D3) 1000 UNITS tablet Take 1,000 Units by mouth daily.     Yes Historical Provider, MD  citalopram (CELEXA) 40 MG tablet TAKE 1 TABLET (40 MG TOTAL) BY MOUTH DAILY. 06/12/15  Yes Juline Patch, MD  cyanocobalamin (,VITAMIN B-12,) 1000 MCG/ML injection Inject 1 mL (1,000 mcg total) into the muscle every 14 (fourteen) days. At the first of the month Patient taking differently: Inject 1,000 mcg into the muscle every 14 (fourteen) days. Takes every other sunday 06/04/15  Yes Evlyn Kanner, NP  ferrous sulfate 325 (65 FE) MG tablet Take 325 mg by mouth daily with breakfast.     Yes Historical Provider, MD  fish oil-omega-3 fatty acids 1000 MG capsule Take 1 g by mouth daily at 12 noon.    Yes Historical Provider, MD  folic acid (FOLVITE) 1 MG tablet Take 4 tablets  (4 mg total) by mouth daily. 10/14/14  Yes Shanker Kristeen Mans, MD  furosemide (LASIX) 80 MG tablet Take 1 tablet (80 mg total) by mouth daily. 03/17/15  Yes Minna Merritts, MD  HYDROcodone-acetaminophen (NORCO/VICODIN) 5-325 MG tablet Take 1 tablet by mouth 3 (three) times daily. 05/11/15  Yes Juline Patch, MD  montelukast (SINGULAIR) 10 MG tablet Take 1 tablet (10 mg total) by mouth at bedtime. 05/11/15  Yes Juline Patch, MD  Multiple Vitamin (MULTIVITAMIN) capsule Take 1 capsule by mouth daily.     Yes Historical  Provider, MD  mycophenolate (CELLCEPT) 500 MG tablet Take 1 tablet (500 mg total) by mouth daily. 06/18/15  Yes Kathrynn Ducking, MD  nystatin cream (MYCOSTATIN) APPLY TOPICALLY TWICE DAILY 01/12/15  Yes Juline Patch, MD  pantoprazole (PROTONIX) 40 MG tablet TAKE 1 TABLET (40 MG TOTAL) BY MOUTH DAILY AT 12 NOON. 06/25/15  Yes Forest Gleason, MD  potassium chloride SA (K-DUR,KLOR-CON) 20 MEQ tablet Take 1 tablet (20 mEq total) by mouth 2 (two) times daily. 03/17/15  Yes Minna Merritts, MD  PROAIR HFA 108 (90 BASE) MCG/ACT inhaler Inhale 2 puffs into the lungs every 6 (six) hours as needed for shortness of breath.  08/21/14  Yes Historical Provider, MD  Resource Beneprotein PACK Take 3 each by mouth 3 (three) times daily. Take 3g three times a day   Yes Historical Provider, MD  temazepam (RESTORIL) 30 MG capsule Take 1 capsule (30 mg total) by mouth at bedtime. 05/11/15  Yes Juline Patch, MD   BP 119/55 mmHg  Pulse 72  Temp(Src) 100.1 F (37.8 C) (Oral)  Resp 22  Wt 60.328 kg  SpO2 95% Physical Exam  Constitutional: He is oriented to person, place, and time. He appears well-developed and well-nourished.  HENT:  Head: Normocephalic and atraumatic.  Mouth/Throat: Oropharynx is clear and moist.  Eyes: Conjunctivae and EOM are normal. Pupils are equal, round, and reactive to light.  Neck: Normal range of motion. Neck supple.  Cardiovascular: Normal rate, regular rhythm and normal heart  sounds.   No murmur heard. Pulmonary/Chest: Effort normal and breath sounds normal.  Abdominal: Soft. Bowel sounds are normal. There is no tenderness.  Musculoskeletal: Normal range of motion.  Neurological: He is alert and oriented to person, place, and time. No cranial nerve deficit. He exhibits normal muscle tone. Coordination normal.  Skin: Skin is warm.  Nursing note and vitals reviewed.   ED Course  Procedures (including critical care time) Labs Review Labs Reviewed  BASIC METABOLIC PANEL - Abnormal; Notable for the following:    Glucose, Bld 108 (*)    BUN 24 (*)    All other components within normal limits  CBC - Abnormal; Notable for the following:    WBC 18.5 (*)    RBC 2.77 (*)    Hemoglobin 9.5 (*)    HCT 30.0 (*)    MCV 108.3 (*)    MCH 34.3 (*)    RDW 20.2 (*)    All other components within normal limits  CBC WITH DIFFERENTIAL/PLATELET  DIFFERENTIAL  I-STAT TROPOININ, ED   Results for orders placed or performed during the hospital encounter of 123456  Basic metabolic panel  Result Value Ref Range   Sodium 139 135 - 145 mmol/L   Potassium 4.5 3.5 - 5.1 mmol/L   Chloride 102 101 - 111 mmol/L   CO2 25 22 - 32 mmol/L   Glucose, Bld 108 (H) 65 - 99 mg/dL   BUN 24 (H) 6 - 20 mg/dL   Creatinine, Ser 0.97 0.61 - 1.24 mg/dL   Calcium 9.5 8.9 - 10.3 mg/dL   GFR calc non Af Amer >60 >60 mL/min   GFR calc Af Amer >60 >60 mL/min   Anion gap 12 5 - 15  CBC  Result Value Ref Range   WBC 18.5 (H) 4.0 - 10.5 K/uL   RBC 2.77 (L) 4.22 - 5.81 MIL/uL   Hemoglobin 9.5 (L) 13.0 - 17.0 g/dL   HCT 30.0 (L) 39.0 - 52.0 %  MCV 108.3 (H) 78.0 - 100.0 fL   MCH 34.3 (H) 26.0 - 34.0 pg   MCHC 31.7 30.0 - 36.0 g/dL   RDW 20.2 (H) 11.5 - 15.5 %   Platelets 315 150 - 400 K/uL  CBC with Differential/Platelet  Result Value Ref Range   WBC DUPLICATE REQUEST K/uL   RBC DUPLICATE REQUEST MIL/uL   Hemoglobin DUPLICATE REQUEST g/dL   HCT DUPLICATE REQUEST %   MCV DUPLICATE  REQUEST fL   MCH DUPLICATE REQUEST pg   MCHC DUPLICATE REQUEST g/dL   RDW DUPLICATE REQUEST %   Platelets DUPLICATE REQUEST K/uL   Neutrophils Relative % PENDING %   Neutro Abs PENDING K/uL   Band Neutrophils PENDING %   Lymphocytes Relative PENDING %   Lymphs Abs PENDING K/uL   Monocytes Relative PENDING %   Monocytes Absolute PENDING K/uL   Eosinophils Relative PENDING %   Eosinophils Absolute PENDING K/uL   Basophils Relative PENDING %   Basophils Absolute PENDING K/uL   WBC Morphology PENDING    RBC Morphology PENDING    Smear Review PENDING    Other PENDING %   nRBC PENDING /100 WBC   Metamyelocytes Relative PENDING %   Myelocytes PENDING %   Promyelocytes Absolute PENDING %   Blasts PENDING %  Differential  Result Value Ref Range   Neutrophils Relative % PENDING %   Neutro Abs PENDING 1.7 - 7.7 K/uL   Band Neutrophils PENDING %   Lymphocytes Relative PENDING %   Lymphs Abs PENDING 0.7 - 4.0 K/uL   Monocytes Relative PENDING %   Monocytes Absolute PENDING 0.1 - 1.0 K/uL   Eosinophils Relative PENDING %   Eosinophils Absolute PENDING 0.0 - 0.7 K/uL   Basophils Relative PENDING %   Basophils Absolute PENDING 0.0 - 0.1 K/uL   WBC Morphology PENDING    RBC Morphology PENDING    Smear Review PENDING    nRBC PENDING 0 /100 WBC   Metamyelocytes Relative PENDING %   Myelocytes PENDING %   Promyelocytes Absolute PENDING %   Blasts PENDING %  I-stat troponin, ED (not at [facility], [facility])  Result Value Ref Range   Troponin i, poc 0.01 0.00 - 0.08 ng/mL   Comment 3            Imaging Review Dg Chest 2 View  06/27/2015  CLINICAL DATA:  Increasing weakness and dyspnea over the past few days. Productive cough of long-standing duration. EXAM: CHEST  2 VIEW COMPARISON:  01/13/2015 FINDINGS: Diffuse mild interstitial coarsening appears unchanged and is likely chronic. No confluent alveolar opacities. No effusions. Borderline cardiomegaly, unchanged. Pulmonary vasculature is normal.  Hilar and mediastinal contours are unremarkable unchanged. Kyphosis and multiple thoracolumbar vertebroplasties again noted, unchanged. IMPRESSION: No acute cardiopulmonary findings. There is mild interstitial coarsening which appears to be chronic, unchanged. Electronically Signed   By: Andreas Newport M.D.   On: 06/27/2015 21:58   I have personally reviewed and evaluated these images and lab results as part of my medical decision-making.   EKG Interpretation   Date/Time:  Saturday June 27 2015 20:41:44 EDT Ventricular Rate:  76 PR Interval:  190 QRS Duration: 144 QT Interval:  478 QTC Calculation: 537 R Axis:   75 Text Interpretation:  Normal sinus rhythm Right bundle branch block Mild  worsening T waves Abnormal ekg Confirmed by ZAVITZ  MD, JOSHUA ([id number]) on  06/27/2015 8:47:39 PM      MDM   Final diagnoses:  Hypoxia  SOB (  shortness of breath)    The patient with four-day history of shortness of breath. Predominantly exertional. Patient is very short of breath when exerting. Patient was visited by his doctor at home oxygen saturations were down to 86% at home. Here at rest on room air sats will drop down to 88%. Witnessed by me. Workup without any significant findings. Other than does have a of unofficial fever of 100.52F. Chest x-ray has no acute cardiopulmonary findings. No evidence of pneumonia. EKG just has a right bundle branch pattern but has a history of leukemia. Does have a jump in his white blood cell count 18,000 that a month ago it was in the 13,000 range. Differential still pending. Patient does not use oxygen at home. Troponin was negative. Patient will be admitted to [place] floor for evaluation for the exertional shortness of breath and continued on 2 L of nasal cannula oxygen.   Fredia Sorrow, MD 06/28/15 [phone]

## 2015-06-27 NOTE — ED Notes (Signed)
Pt reports onset 1 week shortness of breath at rest and on exertion, fatigue. Pt talking in complete sentences.  Dr. Ronnald Ramp at [facility] clinic seen pt at his home and noted pulse ox 86%, rales in RLL and S4 murmur, advised pt to come to Ed.

## 2015-06-28 ENCOUNTER — Encounter (HOSPITAL_COMMUNITY): Payer: Self-pay | Admitting: Family Medicine

## 2015-06-28 ENCOUNTER — Inpatient Hospital Stay (HOSPITAL_COMMUNITY): Payer: Medicare Other

## 2015-06-28 DIAGNOSIS — I2510 Atherosclerotic heart disease of native coronary artery without angina pectoris: Secondary | ICD-10-CM | POA: Diagnosis present

## 2015-06-28 DIAGNOSIS — R0602 Shortness of breath: Secondary | ICD-10-CM

## 2015-06-28 DIAGNOSIS — M3320 Polymyositis, organ involvement unspecified: Secondary | ICD-10-CM

## 2015-06-28 DIAGNOSIS — C9110 Chronic lymphocytic leukemia of B-cell type not having achieved remission: Secondary | ICD-10-CM | POA: Diagnosis not present

## 2015-06-28 DIAGNOSIS — R7881 Bacteremia: Secondary | ICD-10-CM | POA: Diagnosis present

## 2015-06-28 DIAGNOSIS — D539 Nutritional anemia, unspecified: Secondary | ICD-10-CM | POA: Diagnosis present

## 2015-06-28 DIAGNOSIS — R0600 Dyspnea, unspecified: Secondary | ICD-10-CM

## 2015-06-28 DIAGNOSIS — J9601 Acute respiratory failure with hypoxia: Secondary | ICD-10-CM

## 2015-06-28 DIAGNOSIS — B9689 Other specified bacterial agents as the cause of diseases classified elsewhere: Secondary | ICD-10-CM | POA: Diagnosis present

## 2015-06-28 DIAGNOSIS — Z882 Allergy status to sulfonamides status: Secondary | ICD-10-CM | POA: Diagnosis not present

## 2015-06-28 DIAGNOSIS — I5033 Acute on chronic diastolic (congestive) heart failure: Secondary | ICD-10-CM | POA: Diagnosis present

## 2015-06-28 DIAGNOSIS — Z87891 Personal history of nicotine dependence: Secondary | ICD-10-CM | POA: Diagnosis not present

## 2015-06-28 DIAGNOSIS — I5032 Chronic diastolic (congestive) heart failure: Secondary | ICD-10-CM | POA: Diagnosis present

## 2015-06-28 DIAGNOSIS — R0902 Hypoxemia: Secondary | ICD-10-CM | POA: Diagnosis not present

## 2015-06-28 DIAGNOSIS — G4700 Insomnia, unspecified: Secondary | ICD-10-CM | POA: Diagnosis present

## 2015-06-28 DIAGNOSIS — I2699 Other pulmonary embolism without acute cor pulmonale: Secondary | ICD-10-CM | POA: Diagnosis present

## 2015-06-28 DIAGNOSIS — I82409 Acute embolism and thrombosis of unspecified deep veins of unspecified lower extremity: Secondary | ICD-10-CM | POA: Diagnosis not present

## 2015-06-28 DIAGNOSIS — I76 Septic arterial embolism: Secondary | ICD-10-CM | POA: Diagnosis present

## 2015-06-28 DIAGNOSIS — J449 Chronic obstructive pulmonary disease, unspecified: Secondary | ICD-10-CM | POA: Diagnosis present

## 2015-06-28 DIAGNOSIS — I4510 Unspecified right bundle-branch block: Secondary | ICD-10-CM | POA: Diagnosis present

## 2015-06-28 DIAGNOSIS — R5383 Other fatigue: Secondary | ICD-10-CM | POA: Diagnosis not present

## 2015-06-28 DIAGNOSIS — D649 Anemia, unspecified: Secondary | ICD-10-CM

## 2015-06-28 DIAGNOSIS — Z9842 Cataract extraction status, left eye: Secondary | ICD-10-CM | POA: Diagnosis not present

## 2015-06-28 DIAGNOSIS — I10 Essential (primary) hypertension: Secondary | ICD-10-CM | POA: Diagnosis present

## 2015-06-28 DIAGNOSIS — F329 Major depressive disorder, single episode, unspecified: Secondary | ICD-10-CM | POA: Diagnosis present

## 2015-06-28 DIAGNOSIS — I7300 Raynaud's syndrome without gangrene: Secondary | ICD-10-CM | POA: Diagnosis present

## 2015-06-28 DIAGNOSIS — M810 Age-related osteoporosis without current pathological fracture: Secondary | ICD-10-CM | POA: Diagnosis present

## 2015-06-28 DIAGNOSIS — K219 Gastro-esophageal reflux disease without esophagitis: Secondary | ICD-10-CM | POA: Diagnosis present

## 2015-06-28 DIAGNOSIS — R001 Bradycardia, unspecified: Secondary | ICD-10-CM | POA: Diagnosis present

## 2015-06-28 DIAGNOSIS — Z9841 Cataract extraction status, right eye: Secondary | ICD-10-CM | POA: Diagnosis not present

## 2015-06-28 DIAGNOSIS — Z9081 Acquired absence of spleen: Secondary | ICD-10-CM | POA: Diagnosis not present

## 2015-06-28 DIAGNOSIS — Z79899 Other long term (current) drug therapy: Secondary | ICD-10-CM | POA: Diagnosis not present

## 2015-06-28 DIAGNOSIS — C9111 Chronic lymphocytic leukemia of B-cell type in remission: Secondary | ICD-10-CM | POA: Diagnosis present

## 2015-06-28 DIAGNOSIS — E785 Hyperlipidemia, unspecified: Secondary | ICD-10-CM | POA: Diagnosis present

## 2015-06-28 DIAGNOSIS — D591 Other autoimmune hemolytic anemias: Secondary | ICD-10-CM | POA: Diagnosis present

## 2015-06-28 DIAGNOSIS — I351 Nonrheumatic aortic (valve) insufficiency: Secondary | ICD-10-CM | POA: Diagnosis present

## 2015-06-28 DIAGNOSIS — R531 Weakness: Secondary | ICD-10-CM | POA: Diagnosis not present

## 2015-06-28 DIAGNOSIS — I340 Nonrheumatic mitral (valve) insufficiency: Secondary | ICD-10-CM | POA: Diagnosis present

## 2015-06-28 LAB — URINE MICROSCOPIC-ADD ON: RBC / HPF: NONE SEEN RBC/hpf (ref 0–5)

## 2015-06-28 LAB — URINALYSIS, ROUTINE W REFLEX MICROSCOPIC
Glucose, UA: NEGATIVE mg/dL
Hgb urine dipstick: NEGATIVE
KETONES UR: 15 mg/dL — AB
NITRITE: NEGATIVE
Protein, ur: NEGATIVE mg/dL
SPECIFIC GRAVITY, URINE: 1.03 (ref 1.005–1.030)
pH: 5.5 (ref 5.0–8.0)

## 2015-06-28 LAB — IRON AND TIBC
Iron: 82 ug/dL (ref 45–182)
SATURATION RATIOS: 28 % (ref 17.9–39.5)
TIBC: 294 ug/dL (ref 250–450)
UIBC: 212 ug/dL

## 2015-06-28 LAB — BRAIN NATRIURETIC PEPTIDE: B NATRIURETIC PEPTIDE 5: 121.4 pg/mL — AB (ref 0.0–100.0)

## 2015-06-28 LAB — LACTATE DEHYDROGENASE: LDH: 597 U/L — AB (ref 98–192)

## 2015-06-28 LAB — VITAMIN B12: VITAMIN B 12: 1499 pg/mL — AB (ref 180–914)

## 2015-06-28 LAB — FIBRINOGEN: Fibrinogen: 388 mg/dL (ref 204–475)

## 2015-06-28 LAB — LACTIC ACID, PLASMA
LACTIC ACID, VENOUS: 0.8 mmol/L (ref 0.5–2.0)
Lactic Acid, Venous: 0.8 mmol/L (ref 0.5–2.0)

## 2015-06-28 LAB — FERRITIN: Ferritin: 688 ng/mL — ABNORMAL HIGH (ref 24–336)

## 2015-06-28 LAB — D-DIMER, QUANTITATIVE: D-Dimer, Quant: 6.38 ug/mL-FEU — ABNORMAL HIGH (ref 0.00–0.50)

## 2015-06-28 LAB — PROCALCITONIN: Procalcitonin: 0.15 ng/mL

## 2015-06-28 LAB — SEDIMENTATION RATE: SED RATE: 7 mm/h (ref 0–16)

## 2015-06-28 LAB — TSH: TSH: 1.574 u[IU]/mL (ref 0.350–4.500)

## 2015-06-28 MED ORDER — MYCOPHENOLATE MOFETIL 250 MG PO CAPS
500.0000 mg | ORAL_CAPSULE | Freq: Every day | ORAL | Status: DC
  Administered 2015-06-28 – 2015-07-03 (×6): 500 mg via ORAL
  Filled 2015-06-28 (×11): qty 2

## 2015-06-28 MED ORDER — SODIUM CHLORIDE 0.9% FLUSH
3.0000 mL | Freq: Two times a day (BID) | INTRAVENOUS | Status: DC
  Administered 2015-06-28 – 2015-07-03 (×8): 3 mL via INTRAVENOUS

## 2015-06-28 MED ORDER — SODIUM CHLORIDE 0.9 % IV SOLN
INTRAVENOUS | Status: AC
  Administered 2015-06-28: 02:00:00 via INTRAVENOUS

## 2015-06-28 MED ORDER — PANTOPRAZOLE SODIUM 40 MG PO TBEC
40.0000 mg | DELAYED_RELEASE_TABLET | Freq: Every day | ORAL | Status: DC
  Administered 2015-06-28 – 2015-07-03 (×6): 40 mg via ORAL
  Filled 2015-06-28 (×6): qty 1

## 2015-06-28 MED ORDER — ONDANSETRON HCL 4 MG/2ML IJ SOLN: 4.0000 mg | Freq: Four times a day (QID) | INTRAMUSCULAR | Status: DC | PRN

## 2015-06-28 MED ORDER — IOHEXOL 350 MG/ML SOLN
80.0000 mL | Freq: Once | INTRAVENOUS | Status: AC | PRN
  Administered 2015-06-28: 100 mL via INTRAVENOUS

## 2015-06-28 MED ORDER — ENOXAPARIN SODIUM 60 MG/0.6ML ~~LOC~~ SOLN
60.0000 mg | Freq: Two times a day (BID) | SUBCUTANEOUS | Status: DC
  Administered 2015-06-28: 60 mg via SUBCUTANEOUS
  Filled 2015-06-28 (×2): qty 0.6

## 2015-06-28 MED ORDER — ENOXAPARIN SODIUM 40 MG/0.4ML ~~LOC~~ SOLN: 40.0000 mg | SUBCUTANEOUS | Status: DC

## 2015-06-28 MED ORDER — SODIUM CHLORIDE 0.9 % IV SOLN: 250.0000 mL | INTRAVENOUS | Status: DC | PRN

## 2015-06-28 MED ORDER — BENEPROTEIN PO POWD
1.0000 | Freq: Three times a day (TID) | ORAL | Status: DC
  Administered 2015-06-28 – 2015-07-03 (×13): 6 g via ORAL
  Filled 2015-06-28: qty 227

## 2015-06-28 MED ORDER — OMEGA-3-ACID ETHYL ESTERS 1 G PO CAPS
1.0000 g | ORAL_CAPSULE | Freq: Every day | ORAL | Status: DC
  Administered 2015-06-28 – 2015-07-03 (×6): 1 g via ORAL
  Filled 2015-06-28 (×6): qty 1

## 2015-06-28 MED ORDER — APIXABAN 5 MG PO TABS
10.0000 mg | ORAL_TABLET | Freq: Two times a day (BID) | ORAL | Status: DC
  Administered 2015-06-28 – 2015-07-03 (×10): 10 mg via ORAL
  Filled 2015-06-28 (×11): qty 2

## 2015-06-28 MED ORDER — MONTELUKAST SODIUM 10 MG PO TABS
10.0000 mg | ORAL_TABLET | Freq: Every day | ORAL | Status: DC
  Administered 2015-06-28 – 2015-07-02 (×6): 10 mg via ORAL
  Filled 2015-06-28 (×6): qty 1

## 2015-06-28 MED ORDER — SODIUM CHLORIDE 0.9% FLUSH
3.0000 mL | INTRAVENOUS | Status: DC | PRN
  Filled 2015-06-28: qty 3

## 2015-06-28 MED ORDER — APIXABAN 5 MG PO TABS: 5.0000 mg | ORAL_TABLET | Freq: Two times a day (BID) | ORAL | Status: DC

## 2015-06-28 MED ORDER — HYDROCODONE-ACETAMINOPHEN 5-325 MG PO TABS
1.0000 | ORAL_TABLET | Freq: Four times a day (QID) | ORAL | Status: DC | PRN
Start: 2015-06-28 — End: 2015-07-03
  Administered 2015-06-28 – 2015-07-02 (×10): 1 via ORAL
  Filled 2015-06-28 (×10): qty 1

## 2015-06-28 MED ORDER — IPRATROPIUM-ALBUTEROL 0.5-2.5 (3) MG/3ML IN SOLN: 3.0000 mL | RESPIRATORY_TRACT | Status: DC | PRN

## 2015-06-28 MED ORDER — CALCIUM CARBONATE-VITAMIN D 500-200 MG-UNIT PO TABS
1.0000 | ORAL_TABLET | Freq: Two times a day (BID) | ORAL | Status: DC
  Administered 2015-06-28 – 2015-07-03 (×11): 1 via ORAL
  Filled 2015-06-28 (×11): qty 1

## 2015-06-28 MED ORDER — FOLIC ACID 1 MG PO TABS
4.0000 mg | ORAL_TABLET | Freq: Every day | ORAL | Status: DC
  Administered 2015-06-28 – 2015-07-03 (×6): 4 mg via ORAL
  Filled 2015-06-28 (×6): qty 4

## 2015-06-28 MED ORDER — LISINOPRIL 2.5 MG PO TABS
2.5000 mg | ORAL_TABLET | Freq: Every day | ORAL | Status: DC
  Administered 2015-06-28 – 2015-06-29 (×2): 2.5 mg via ORAL
  Filled 2015-06-28 (×3): qty 1

## 2015-06-28 MED ORDER — VITAMIN D 1000 UNITS PO TABS
1000.0000 [IU] | ORAL_TABLET | Freq: Every day | ORAL | Status: DC
  Administered 2015-06-28 – 2015-07-03 (×6): 1000 [IU] via ORAL
  Filled 2015-06-28 (×6): qty 1

## 2015-06-28 MED ORDER — ENOXAPARIN (LOVENOX) PATIENT EDUCATION KIT
PACK | Freq: Once | Status: DC
  Filled 2015-06-28: qty 1

## 2015-06-28 MED ORDER — ACETAMINOPHEN 325 MG PO TABS
650.0000 mg | ORAL_TABLET | ORAL | Status: DC | PRN
  Administered 2015-06-29 – 2015-07-02 (×3): 650 mg via ORAL
  Filled 2015-06-28 (×3): qty 2

## 2015-06-28 MED ORDER — ADULT MULTIVITAMIN W/MINERALS CH
1.0000 | ORAL_TABLET | Freq: Every day | ORAL | Status: DC
  Administered 2015-06-28 – 2015-07-03 (×6): 1 via ORAL
  Filled 2015-06-28 (×6): qty 1

## 2015-06-28 MED ORDER — FERROUS SULFATE 325 (65 FE) MG PO TABS
325.0000 mg | ORAL_TABLET | Freq: Every day | ORAL | Status: DC
  Administered 2015-06-28 – 2015-07-03 (×6): 325 mg via ORAL
  Filled 2015-06-28 (×6): qty 1

## 2015-06-28 MED ORDER — TEMAZEPAM 15 MG PO CAPS
30.0000 mg | ORAL_CAPSULE | Freq: Every day | ORAL | Status: DC
  Administered 2015-06-28 – 2015-07-02 (×6): 30 mg via ORAL
  Filled 2015-06-28 (×6): qty 2

## 2015-06-28 MED ORDER — CITALOPRAM HYDROBROMIDE 20 MG PO TABS
40.0000 mg | ORAL_TABLET | Freq: Every day | ORAL | Status: DC
  Administered 2015-06-28 – 2015-07-03 (×6): 40 mg via ORAL
  Filled 2015-06-28 (×6): qty 2

## 2015-06-28 NOTE — Progress Notes (Signed)
ANTICOAGULATION CONSULT NOTE - Initial Consult  Pharmacy Consult for Lovenox Indication: pulmonary embolus  Allergies  Allergen Reactions  . Sulfonamide Derivatives Itching and Rash    Patient Measurements: Height: 5\' 5"  (165.1 cm) Weight: 138 lb 3.7 oz (62.7 kg) IBW/kg (Calculated) : 61.5  Vital Signs: Temp: 98.7 F (37.1 C) (03/19 0348) Temp Source: Oral (03/19 0348) BP: 145/52 mmHg (03/19 0348) Pulse Rate: 73 (03/19 0348)  Labs:  Recent Labs  06/27/15 2105 06/27/15 [id number]  HGB 9.5* DUPLICATE REQUEST  HCT [id number]* DUPLICATE REQUEST  PLT 123456 DUPLICATE REQUEST  CREATININE 0.97  --     Estimated Creatinine Clearance: 50.2 mL/min (by C-G formula based on Cr of 0.97).   Medical History: Past Medical History  Diagnosis Date  . CLL (chronic lymphoblastic leukemia) 2006    Rituxan treatment 2006  . Atrial septal aneurysm 2009    Insignificant, no, January, 2009  . Infection of PEG site ([place])     cellulitis.Marland KitchenResolved  . Vertebral compression fracture (HCC)     multiple levels  . Herpes zoster     right chest  . Aortic insufficiency 2009    mild, Mild, echo, 2012  . Diastolic dysfunction     mild  . Chest pain     EF 55%, echo, October, 2009, possible inferior and posterior borderline hypokinesis, patient has never  had cardiac catheterization  . Edema     EF 55%, echo, October, 2009, possible borderline inferior and posterior hypokinesis... heart catheterization has never been done(July 10, 2009)  . Myositis     Caused pulmonary insufficiency with muscle weakness in the past / Dr.Willis-neurology, Imuran and prednisone  . Calculus of kidney   . Inguinal hernia without mention of obstruction or gangrene, unilateral or unspecified, (not specified as recurrent)     right   . Unspecified gastritis and gastroduodenitis without mention of hemorrhage   . Abdominal pain, epigastric   . Nonspecific elevation of levels of transaminase or lactic acid dehydrogenase (LDH)   .  Feeding difficulties and mismanagement   . Edema of male genital organs     Resolved, occurred during illness related to myositis  . Swelling of arm     left.Marland KitchenMarland KitchenResolved.... no DVT  . LFT elevation     Related to Imuran in the past... dose was adjusted  . Dyslipidemia     statins not used due to LFT abnormalities  . Shortness of breath     Breathing abnormalities related to muscle weakness from myositis  . RBBB (right bundle branch block)     Old  . Bradycardia     Sinus bradycardia, old  . Ejection fraction     EF 60%, echo, October, 2012  . Mitral regurgitation     Mild / moderate, echo, 2012  . Right ventricular dysfunction     Mild, echo, 2012  . Febrile illness     May, 2013, question sinusitis  . Polymyositis ([place]) 11/26/2012  . Hemolytic anemia ([place])   . Cellulitis   . Chronic diastolic CHF (congestive heart failure) ([place]) 03/17/2015    Medications:  Prescriptions prior to admission  Medication Sig Dispense Refill Last Dose  . albuterol (PROVENTIL) (2.5 MG/3ML) 0.083% nebulizer solution Take 3 mLs (2.5 mg total) by nebulization every 6 (six) hours as needed for wheezing or shortness of breath. 75 mL 12 06/27/2015 at Unknown time  . calcium-vitamin D (CALCIUM 500+D) 500-200 MG-UNIT per tablet Take 1 tablet by mouth 2 (two) times daily.  06/27/2015 at am  . Cholecalciferol (VITAMIN D3) 1000 UNITS tablet Take 1,000 Units by mouth daily.     06/27/2015 at Unknown time  . citalopram (CELEXA) 40 MG tablet TAKE 1 TABLET (40 MG TOTAL) BY MOUTH DAILY. 90 tablet 0 06/27/2015 at Unknown time  . cyanocobalamin (,VITAMIN B-12,) 1000 MCG/ML injection Inject 1 mL (1,000 mcg total) into the muscle every 14 (fourteen) days. At the first of the month (Patient taking differently: Inject 1,000 mcg into the muscle every 14 (fourteen) days. Takes every other sunday) 10 mL 3 Past Week at Unknown time  . ferrous sulfate 325 (65 FE) MG tablet Take 325 mg by mouth daily with breakfast.     06/27/2015 at  Unknown time  . fish oil-omega-3 fatty acids 1000 MG capsule Take 1 g by mouth daily at 12 noon.    06/27/2015 at Unknown time  . folic acid (FOLVITE) 1 MG tablet Take 4 tablets (4 mg total) by mouth daily. 120 tablet 0 06/27/2015 at Unknown time  . furosemide (LASIX) 80 MG tablet Take 1 tablet (80 mg total) by mouth daily. 90 tablet 3 06/27/2015 at Unknown time  . HYDROcodone-acetaminophen (NORCO/VICODIN) 5-325 MG tablet Take 1 tablet by mouth 3 (three) times daily. 30 tablet 0 06/27/2015 at Unknown time  . montelukast (SINGULAIR) 10 MG tablet Take 1 tablet (10 mg total) by mouth at bedtime. 1 tablet 11 06/26/2015 at Unknown time  . Multiple Vitamin (MULTIVITAMIN) capsule Take 1 capsule by mouth daily.     06/27/2015 at Unknown time  . mycophenolate (CELLCEPT) 500 MG tablet Take 1 tablet (500 mg total) by mouth daily. 90 tablet 3 06/27/2015 at Unknown time  . nystatin cream (MYCOSTATIN) APPLY TOPICALLY TWICE DAILY 30 g 0 06/27/2015 at Unknown time  . pantoprazole (PROTONIX) 40 MG tablet TAKE 1 TABLET (40 MG TOTAL) BY MOUTH DAILY AT 12 NOON. 90 tablet 1 06/27/2015 at Unknown time  . potassium chloride SA (K-DUR,KLOR-CON) 20 MEQ tablet Take 1 tablet (20 mEq total) by mouth 2 (two) times daily. 180 tablet 3 06/27/2015 at am  . PROAIR HFA 108 (90 BASE) MCG/ACT inhaler Inhale 2 puffs into the lungs every 6 (six) hours as needed for shortness of breath.   11 Past Month at Unknown time  . Resource Beneprotein PACK Take 3 each by mouth 3 (three) times daily. Take 3g three times a day   06/27/2015 at Unknown time  . temazepam (RESTORIL) 30 MG capsule Take 1 capsule (30 mg total) by mouth at bedtime. 30 capsule 5 06/26/2015 at Unknown time    Assessment: 82 y.o. male with SOB, found to have PE, for Lovenox  Goal of Therapy:  Full anticoagulation with Lovenox Monitor platelets by anticoagulation protocol: Yes   Plan:  Lovenox 60 mg SQ q12h  Caryl Pina 06/28/2015,5:23 AM

## 2015-06-28 NOTE — ED Notes (Signed)
Attempted to give report 

## 2015-06-28 NOTE — Progress Notes (Signed)
PT Cancellation Note  Patient Details Name: [name] [name] MRN: [id number] DOB: 10-Jul-1929   Cancelled Treatment:    Reason Eval/Treat Not Completed: Medical issues which prohibited therapy.  Acute PE, Pharmacy consulted this am.  Will hold PT until anticoagulation therapy has reached 24 hours.  PT will continue to follow acutely.  Collie Siad PT, DPT  Pager: [phone] Phone: [phone] 06/28/2015, 1:11 PM

## 2015-06-28 NOTE — Progress Notes (Signed)
[name] [name] [id number] DOB: January 24, 1930 DOA: 06/27/2015 PCP: Otilio Miu, MD  Brief narrative: 89 ? CLL diag 2007 foll. Dr. Oliva Bustard [[place]]-Cellcept/Prednisone-->Rituxan till 02/2015--Has AIHA related to this Prior Thigh cellulitis H/o Polymyositis diag 2009 on Cellcept Raynaud's AoS insuff asymptomatoic bradycardia c RBBB Prior Odontoid + C1 # 2009  Admit [facility] c 3 week subacute h/o exertional dyspnea without Neuromuscular/Volume overload component on itial eval Sats 86% as per PCP who sent patient to ED  Eval revealed vol depletion, BUn/Creat 25:1 CXR neg  CT scan chest + for small PE [ILD related ? Cellcept use]   Past medical history-As per Problem list Chart reviewed as below-   Consultants:  none  Procedures:  none  Antibiotics:  none   Subjective    nad No Sob but sat ion oxygen 88-90% No chest pain No blurred nor double vision No fever no sputum  Has had a chronic cough ~ 3 mo    Objective    Interim History:   Telemetry:    Objective: Filed Vitals:   06/28/15 0230 06/28/15 0245 06/28/15 0348 06/28/15 0753  BP: 113/53 121/51 145/52   Pulse: 69 66 73   Temp:   98.7 F (37.1 C) 98.5 F (36.9 C)  TempSrc:   Oral   Resp: 27 19 16    Height:   5\' 5"  (1.651 m)   Weight:   62.7 kg (138 lb 3.7 oz)   SpO2: 94% 94% 92%     Intake/Output Summary (Last 24 hours) at 06/28/15 0757 Last data filed at 06/28/15 0350  Gross per 24 hour  Intake    120 ml  Output    185 ml  Net    -65 ml    Exam:  General: eomi ncat Cardiovascular: s1 s 2no m/r/g Respiratory: clear no added soudn Abdomen: soft nt nd no rebound no gaurding Skin no le edema Neuro intact moving x4 limbs well  Data Reviewed: Basic Metabolic Panel:  Recent Labs Lab 06/27/15 2105  NA 139  K 4.5  CL 102  CO2 25  GLUCOSE 108*  BUN 24*  CREATININE 0.97  CALCIUM 9.5   Liver Function Tests: No results for input(s): AST, ALT, ALKPHOS, BILITOT, PROT, ALBUMIN in  the last 168 hours. No results for input(s): LIPASE, AMYLASE in the last 168 hours. No results for input(s): AMMONIA in the last 168 hours. CBC:  Recent Labs Lab 06/27/15 2105 06/27/15 [id number]  WBC [id number]* DUPLICATE REQUEST  NEUTROABS 8.3* PENDING  HGB 9.5* DUPLICATE REQUEST  HCT Q000111Q* DUPLICATE REQUEST  MCV 123456* DUPLICATE REQUEST  PLT 123456 DUPLICATE REQUEST   Cardiac Enzymes: No results for input(s): CKTOTAL, CKMB, CKMBINDEX, TROPONINI in the last 168 hours. BNP: Invalid input(s): POCBNP CBG: No results for input(s): GLUCAP in the last 168 hours.  No results found for this or any previous visit (from the past 240 hour(s)).   Studies:              All Imaging reviewed and is as per above notation   Scheduled Meds: . calcium-vitamin D  1 tablet Oral BID  . cholecalciferol  1,000 Units Oral Daily  . citalopram  40 mg Oral Daily  . enoxaparin (LOVENOX) injection  60 mg Subcutaneous BID  . ferrous sulfate  325 mg Oral Q breakfast  . folic acid  4 mg Oral Daily  . lisinopril  2.5 mg Oral Daily  . montelukast  10 mg Oral QHS  . multivitamin with minerals  1 tablet Oral  Daily  . mycophenolate  500 mg Oral Daily  . omega-3 acid ethyl esters  1 g Oral Q1200  . pantoprazole  40 mg Oral Daily  . protein supplement  1 scoop Oral TID  . sodium chloride flush  3 mL Intravenous Q12H  . temazepam  30 mg Oral QHS   Continuous Infusions: . sodium chloride 100 mL/hr at 06/28/15 0220     Assessment/Plan:  1. Acute PE-Risk factor CLL-Lifelong AC with Lovenox/VKA-D/w Dr. Alvy Bimler who confirms that patient should be okay with ~ 6 mo Elliquis and the duration of this medication would need dependent on activity of his CLL. To be determined as an outpatient with Dr. Oliva Bustard.  Given small emboli and hemodynamic stability, have canceled echocardiogram 2. Acute hypoxic resp failure-2/2 to Pulm embolism.  Will need home oxygen.  Needs desat screen. 3. Polymyositis on Cellcept-Stable.  OP  consideration per Dr. Jannifer Franklin re: changing Cellcept-patient relates in the past year this was decreased for other intolerances 4. AoS insuff-stable currently and non contributory 5. Mild Vol depletion-Bun/CReat, 24/0.97-->rpt labs in am cont saline @ 50 cc/hr until 06/29/15 6. Asymptomatic bradycardia RBBB-continue telemetry recheck in a.m. 7. Anemia-continue ferrous sulfate 325 every morning 8. Depression continue citalopram 40 daily, temazepam 30 daily at bedtime 9. Hypertension continue lisinopril 2.5 daily 10. Reflux continue pantoprazole 40 daily 11. Osteoporosis continue calcium plus vitamin D as well as cholecalciferol    Long discussion with daughter who is a nurse >35 minutes Ambulate, pulse ox and he said screen in a.m.-likely will need therapy evaluation and then decision regarding home versus skilled facility in the next 24-48 hours  Verneita Griffes, MD  [facility] Pager [phone] 06/28/2015, 7:57 AM    LOS: 0 days

## 2015-06-28 NOTE — H&P (Signed)
[facility] History and Physical  [name] [name] [id number] DOB: September 25, 1929 DOA: 06/27/2015  Referring physician: ED physician PCP: Otilio Miu, MD  Specialists: Dr. Oliva Bustard (oncology), Dr. Rockey Situ (cardiology), Dr. Jannifer Franklin (neurology)   Chief Complaint:  Progressive dyspnea   HPI: [name] [name] is a 82 y.o. male with PMH of chronic diastolic CHF, CLL in remission, autoimmune hemolytic anemia previously on prednisone, and polymyositis on CellCept who presents to the ED with 1 week of progressive dyspnea on exertion. Patient lives alone and at his baseline walks for exercise daily, but since the development of exertional dyspnea approximately one week ago, he has been unable to tolerate this. Symptoms have continued to worsen to the point where he is been unable to attend to his household chores for the last 3 days due to dyspnea. He has history of chronic diastolic CHF, taking 80 mg Lasix daily, but denies development of any lower extremity edema during this illness. He denies chest pain, palpitations, or unilateral leg swelling or tenderness. There's been no rhinorrhea, sore throat, or sick contacts. He denies fevers, but endorses chills for the past few days. He endorses a cough, occasionally productive of thick white sputum, but reports that this is been present for several months despite two courses of Levaquin and a chest CT that was reportedly unremarkable. His primary care physician made a house call earlier in the day and reported the patient to be saturating 86% on room air. Patient does not typically use oxygen at home. He was advised to come into the emergency department for evaluation.  In ED, patient was found to have a temperature elevated to 37.8 C. He was requiring 2 L/m to saturate in the mid 90s, and saturations dropped to 89% at rest on room air. He is tachypneic with respiratory rate in the high 20s, with normal heart rate and marginal but stable blood pressure. EKG revealed  normal sinus rhythm with right bundle branch block that was unchanged from prior. Chest x-ray is negative for acute cardiopulmonary disease. CMP features BUN of 24 and serum creatinine is 0.97, up from an apparent baseline of 0.7. CBC is notable for leukocytosis to 18,500, up from 13,000 less than 1 month ago. There is also been a 4 g drop in hemoglobin over this interval, from 13.5 to 9.5 on admission tonight. Patient remained hemodynamically stable in the ED and will be admitted to the hospital for ongoing evaluation and management of acute hypoxic respiratory failure with etiology not yet determined.  Where does patient live?   At home     Can patient participate in ADLs?  Yes        Review of Systems:   General: no fevers, sweats, weight change, poor appetite, or fatigue. Chills. HEENT: no blurry vision, hearing changes or sore throat Pulm: no wheeze. Dyspnea, cough.  CV: no chest pain or palpitations Abd: no nausea, vomiting, abdominal pain, or constipation. Diarrhea.  GU: no dysuria, hematuria, increased urinary frequency, or urgency. Darkened urine.   Ext: no leg edema Neuro: no focal weakness, numbness, or tingling, no vision change or hearing loss Skin: no rash, no wounds MSK: No muscle spasm, no deformity, no red, hot, or swollen joint Heme: No easy bruising or bleeding Travel history: No recent long distant travel    Allergy:  Allergies  Allergen Reactions  . Sulfonamide Derivatives Itching and Rash    Past Medical History  Diagnosis Date  . CLL (chronic lymphoblastic leukemia) 2006    Rituxan treatment 2006  .  Atrial septal aneurysm 2009    Insignificant, no, January, 2009  . Infection of PEG site ([place])     cellulitis.Marland KitchenResolved  . Vertebral compression fracture ([facility])     multiple levels  . Herpes zoster     right chest  . Aortic insufficiency 2009    mild, Mild, echo, 2012  . Diastolic dysfunction     mild  . Chest pain     EF 55%, echo, October, 2009, possible  inferior and posterior borderline hypokinesis, patient has never  had cardiac catheterization  . Edema     EF 55%, echo, October, 2009, possible borderline inferior and posterior hypokinesis... heart catheterization has never been done(July 10, 2009)  . Myositis     Caused pulmonary insufficiency with muscle weakness in the past / Dr.Willis-neurology, Imuran and prednisone  . Calculus of kidney   . Inguinal hernia without mention of obstruction or gangrene, unilateral or unspecified, (not specified as recurrent)     right   . Unspecified gastritis and gastroduodenitis without mention of hemorrhage   . Abdominal pain, epigastric   . Nonspecific elevation of levels of transaminase or lactic acid dehydrogenase (LDH)   . Feeding difficulties and mismanagement   . Edema of male genital organs     Resolved, occurred during illness related to myositis  . Swelling of arm     left.Marland KitchenMarland KitchenResolved.... no DVT  . LFT elevation     Related to Imuran in the past... dose was adjusted  . Dyslipidemia     statins not used due to LFT abnormalities  . Shortness of breath     Breathing abnormalities related to muscle weakness from myositis  . RBBB (right bundle branch block)     Old  . Bradycardia     Sinus bradycardia, old  . Ejection fraction     EF 60%, echo, October, 2012  . Mitral regurgitation     Mild / moderate, echo, 2012  . Right ventricular dysfunction     Mild, echo, 2012  . Febrile illness     May, 2013, question sinusitis  . Polymyositis ([place]) 11/26/2012  . Hemolytic anemia ([place])   . Cellulitis   . Chronic diastolic CHF (congestive heart failure) ([place]) 03/17/2015    Past Surgical History  Procedure Laterality Date  . Splenectomy    . Hernia repair      left side  . Peg placement  06/2007  . Peg tube removal  12/2007  . Kyphosis surgery      multilevel vertebral  . Odontoid fracture surgery      anterior screw fixation  . Nasal sinus surgery      x2  . Cataract extraction       bi lateral    Social History:  reports that he quit smoking about 47 years ago. His smoking use included Cigarettes. He has a 15.5 pack-year smoking history. He has quit using smokeless tobacco. His smokeless tobacco use included Chew. He reports that he does not drink alcohol. His drug history is not on file.  Family History:  Family History  Problem Relation Age of Onset  . Breast cancer Sister   . Colon cancer Brother   . Prostate cancer Brother   . Stroke Mother   . Hypertension Mother   . Emphysema Father   . Diabetes Grandchild   . Heart attack Neg Hx   . Cancer Brother   . Cancer Sister   . Cancer Daughter      Prior to  Admission medications   Medication Sig Start Date End Date Taking? Authorizing Provider  albuterol (PROVENTIL) (2.5 MG/3ML) 0.083% nebulizer solution Take 3 mLs (2.5 mg total) by nebulization every 6 (six) hours as needed for wheezing or shortness of breath. 01/06/15  Yes Juline Patch, MD  calcium-vitamin D (CALCIUM 500+D) 500-200 MG-UNIT per tablet Take 1 tablet by mouth 2 (two) times daily.     Yes Historical Provider, MD  Cholecalciferol (VITAMIN D3) 1000 UNITS tablet Take 1,000 Units by mouth daily.     Yes Historical Provider, MD  citalopram (CELEXA) 40 MG tablet TAKE 1 TABLET (40 MG TOTAL) BY MOUTH DAILY. 06/12/15  Yes Juline Patch, MD  cyanocobalamin (,VITAMIN B-12,) 1000 MCG/ML injection Inject 1 mL (1,000 mcg total) into the muscle every 14 (fourteen) days. At the first of the month Patient taking differently: Inject 1,000 mcg into the muscle every 14 (fourteen) days. Takes every other sunday 06/04/15  Yes Evlyn Kanner, NP  ferrous sulfate 325 (65 FE) MG tablet Take 325 mg by mouth daily with breakfast.     Yes Historical Provider, MD  fish oil-omega-3 fatty acids 1000 MG capsule Take 1 g by mouth daily at 12 noon.    Yes Historical Provider, MD  folic acid (FOLVITE) 1 MG tablet Take 4 tablets (4 mg total) by mouth daily. 10/14/14  Yes Shanker Kristeen Mans, MD  furosemide (LASIX) 80 MG tablet Take 1 tablet (80 mg total) by mouth daily. 03/17/15  Yes Minna Merritts, MD  HYDROcodone-acetaminophen (NORCO/VICODIN) 5-325 MG tablet Take 1 tablet by mouth 3 (three) times daily. 05/11/15  Yes Juline Patch, MD  montelukast (SINGULAIR) 10 MG tablet Take 1 tablet (10 mg total) by mouth at bedtime. 05/11/15  Yes Juline Patch, MD  Multiple Vitamin (MULTIVITAMIN) capsule Take 1 capsule by mouth daily.     Yes Historical Provider, MD  mycophenolate (CELLCEPT) 500 MG tablet Take 1 tablet (500 mg total) by mouth daily. 06/18/15  Yes Kathrynn Ducking, MD  nystatin cream (MYCOSTATIN) APPLY TOPICALLY TWICE DAILY 01/12/15  Yes Juline Patch, MD  pantoprazole (PROTONIX) 40 MG tablet TAKE 1 TABLET (40 MG TOTAL) BY MOUTH DAILY AT 12 NOON. 06/25/15  Yes Forest Gleason, MD  potassium chloride SA (K-DUR,KLOR-CON) 20 MEQ tablet Take 1 tablet (20 mEq total) by mouth 2 (two) times daily. 03/17/15  Yes Minna Merritts, MD  PROAIR HFA 108 (90 BASE) MCG/ACT inhaler Inhale 2 puffs into the lungs every 6 (six) hours as needed for shortness of breath.  08/21/14  Yes Historical Provider, MD  Resource Beneprotein PACK Take 3 each by mouth 3 (three) times daily. Take 3g three times a day   Yes Historical Provider, MD  temazepam (RESTORIL) 30 MG capsule Take 1 capsule (30 mg total) by mouth at bedtime. 05/11/15  Yes Juline Patch, MD    Physical Exam: Filed Vitals:   06/27/15 2315 06/27/15 2330 06/28/15 0000 06/28/15 0015  BP: 119/55 126/53 118/52 115/52  Pulse: 72 79 63 62  Temp:      TempSrc:      Resp: 22 27 25 22   Weight:      SpO2: 95%  94% 95%   General: In mild respiratory distress, dyspnea with 3-4 words  HEENT:       Eyes: PERRL, EOMI, no scleral icterus or conjunctival pallor.       ENT: No discharge from the ears or nose, no pharyngeal ulcers, oral mucosa moist.  Neck: No JVD, no bruit, no appreciable mass Heme: No cervical adenopathy, no pallor Cardiac:  S1/S2, RRR, grade III crescendo-decrescendo murmur at LSB, No gallops or rubs. Pulm: Good air movement bilaterally. No rales, wheezing, rhonchi or rubs. Abd: Soft, nondistended, nontender, no rebound pain or gaurding, BS present. Ext: No LE edema bilaterally. 2+DP/PT pulse bilaterally. Musculoskeletal: No gross deformity, no red, hot, swollen joints   Skin: No rashes or wounds on exposed surfaces  Neuro: Alert, oriented X3, cranial nerves II-XII grossly intact. No focal findings Psych: Patient is not overtly psychotic, appropriate mood and affect.  Labs on Admission:  Basic Metabolic Panel:  Recent Labs Lab 06/27/15 2105  NA 139  K 4.5  CL 102  CO2 25  GLUCOSE 108*  BUN 24*  CREATININE 0.97  CALCIUM 9.5   Liver Function Tests: No results for input(s): AST, ALT, ALKPHOS, BILITOT, PROT, ALBUMIN in the last 168 hours. No results for input(s): LIPASE, AMYLASE in the last 168 hours. No results for input(s): AMMONIA in the last 168 hours. CBC:  Recent Labs Lab 06/27/15 2105 06/27/15 [id number]  WBC [id number]* DUPLICATE REQUEST  NEUTROABS 8.3* PENDING  HGB 9.5* DUPLICATE REQUEST  HCT Q000111Q* DUPLICATE REQUEST  MCV 123456* DUPLICATE REQUEST  PLT 123456 DUPLICATE REQUEST   Cardiac Enzymes: No results for input(s): CKTOTAL, CKMB, CKMBINDEX, TROPONINI in the last 168 hours.  BNP (last 3 results)  Recent Labs  10/10/14 1403  BNP 279.6*    ProBNP (last 3 results) No results for input(s): PROBNP in the last 8760 hours.  CBG: No results for input(s): GLUCAP in the last 168 hours.  Radiological Exams on Admission: Dg Chest 2 View  06/27/2015  CLINICAL DATA:  Increasing weakness and dyspnea over the past few days. Productive cough of long-standing duration. EXAM: CHEST  2 VIEW COMPARISON:  01/13/2015 FINDINGS: Diffuse mild interstitial coarsening appears unchanged and is likely chronic. No confluent alveolar opacities. No effusions. Borderline cardiomegaly, unchanged. Pulmonary vasculature  is normal. Hilar and mediastinal contours are unremarkable unchanged. Kyphosis and multiple thoracolumbar vertebroplasties again noted, unchanged. IMPRESSION: No acute cardiopulmonary findings. There is mild interstitial coarsening which appears to be chronic, unchanged. Electronically Signed   By: Andreas Newport M.D.   On: 06/27/2015 21:58    EKG: Independently reviewed.  Abnormal findings:  Normal sinus rhythm, RBBB  Assessment/Plan  1. Acute hypoxic respiratory failure  - Etiology not yet clear  - No infiltrate or edema on CXR, no URI sxs or viral prodrome to suggest flu - CXR and prior CT suggestive of possible ILD -> V/Q mismatch possible  - No convincing evidence of acute CHF  - Not related to neuromuscular dz or body habitus as pt A&O x4 with good effort, not obese - Low pretest probability of acute PE, d-dimer 6.4, will proceed with CTA PE study  - Continuous pulse oximetry with titration of FiO2 to maintain sat >92%   2. Chronic diastolic CHF  - TTE (99991111) EF 123456, grade 2 diastolic dysfunction, mild AR, trivial TR  - BNP on admission is 121; no edema or congestion on CXR; no peripheral edema or significant JVD; no gallop  - Pt appears dry clinically; BUN:SCr ratio 25 - Gently hydrating overnight, holding Lasix  - TTE ordered given to further evaluate respiratory failure  - Strict I/O, daily wts, fluid-restrict diet    3. CLL  - Previously on Rituxan until November '16 with Dr. Oliva Bustard  - Reportedly in remission at this time  - WBC 18,500 on  admission, up from 13,300 on 06/04/15; absolute lymphocyte count 6.7, highest since last round of rituximab   - Low-grade temp potentially related to an underlying neoplastic process  - Polychromasia and target cells noted on peripheral smear, but no mention of atypical lymphs or smudge cell    4. Macrocytic anemia  - Hgb 9.5 on admission with MCV 108.3; on 06/04/15, Hgb was 13.5 and MCV 98.5 - Iron studies, B12, folate pending  -  No sign of active blood loss, will check FOBT  - Recurrence in autoimmune hemolytic anemia is a concern; will check hemolysis labs  - Monitoring  - Continue iron, folate, B12 supplementation    5. Polymyositis  - Stable  - Maintained on mycophenolate 500 mg qD, will continue    6. CAD  - No angina or equivalent sxs  - Trop 0.01 on arrival, EKG without acute changes   7. Depression, insomnia  - Stable  - Continue with home-dose Celexa qD, Restoril qHS prn       DVT ppx:  SQ Lovenox      Code Status: Full code Family Communication:  Yes, patient's daughter at bed side Disposition Plan: Admit to inpatient   Date of Service 06/28/2015    Vianne Bulls, MD [facility] Pager [phone]  If 7PM-7AM, please contact night-coverage [email or web address] Password [email or web address] 06/28/2015, 1:52 AM

## 2015-06-28 NOTE — Progress Notes (Signed)
ANTICOAGULATION CONSULT NOTE - Initial Consult  Pharmacy Consult for Switch Lovenox to Apixaban Indication: pulmonary embolus  Allergies  Allergen Reactions  . Sulfonamide Derivatives Itching and Rash    Patient Measurements: Height: 5\' 5"  (165.1 cm) Weight: 138 lb 3.7 oz (62.7 kg) IBW/kg (Calculated) : 61.5  Vital Signs: Temp: 98.3 F (36.8 C) (03/19 0951) Temp Source: Oral (03/19 0951) BP: 107/46 mmHg (03/19 0951) Pulse Rate: 67 (03/19 0951)  Labs:  Recent Labs  06/27/15 2105 06/27/15 123XX123  HGB 9.5* DUPLICATE REQUEST  HCT Q000111Q* DUPLICATE REQUEST  PLT 123456 DUPLICATE REQUEST  CREATININE 0.97  --     Estimated Creatinine Clearance: 50.2 mL/min (by C-G formula based on Cr of 0.97).  Medications:  Prescriptions prior to admission  Medication Sig Dispense Refill Last Dose  . albuterol (PROVENTIL) (2.5 MG/3ML) 0.083% nebulizer solution Take 3 mLs (2.5 mg total) by nebulization every 6 (six) hours as needed for wheezing or shortness of breath. 75 mL 12 06/27/2015 at Unknown time  . calcium-vitamin D (CALCIUM 500+D) 500-200 MG-UNIT per tablet Take 1 tablet by mouth 2 (two) times daily.     06/27/2015 at am  . Cholecalciferol (VITAMIN D3) 1000 UNITS tablet Take 1,000 Units by mouth daily.     06/27/2015 at Unknown time  . citalopram (CELEXA) 40 MG tablet TAKE 1 TABLET (40 MG TOTAL) BY MOUTH DAILY. 90 tablet 0 06/27/2015 at Unknown time  . cyanocobalamin (,VITAMIN B-12,) 1000 MCG/ML injection Inject 1 mL (1,000 mcg total) into the muscle every 14 (fourteen) days. At the first of the month (Patient taking differently: Inject 1,000 mcg into the muscle every 14 (fourteen) days. Takes every other sunday) 10 mL 3 Past Week at Unknown time  . ferrous sulfate 325 (65 FE) MG tablet Take 325 mg by mouth daily with breakfast.     06/27/2015 at Unknown time  . fish oil-omega-3 fatty acids 1000 MG capsule Take 1 g by mouth daily at 12 noon.    06/27/2015 at Unknown time  . folic acid (FOLVITE) 1 MG  tablet Take 4 tablets (4 mg total) by mouth daily. 120 tablet 0 06/27/2015 at Unknown time  . furosemide (LASIX) 80 MG tablet Take 1 tablet (80 mg total) by mouth daily. 90 tablet 3 06/27/2015 at Unknown time  . HYDROcodone-acetaminophen (NORCO/VICODIN) 5-325 MG tablet Take 1 tablet by mouth 3 (three) times daily. 30 tablet 0 06/27/2015 at Unknown time  . montelukast (SINGULAIR) 10 MG tablet Take 1 tablet (10 mg total) by mouth at bedtime. 1 tablet 11 06/26/2015 at Unknown time  . Multiple Vitamin (MULTIVITAMIN) capsule Take 1 capsule by mouth daily.     06/27/2015 at Unknown time  . mycophenolate (CELLCEPT) 500 MG tablet Take 1 tablet (500 mg total) by mouth daily. 90 tablet 3 06/27/2015 at Unknown time  . nystatin cream (MYCOSTATIN) APPLY TOPICALLY TWICE DAILY 30 g 0 06/27/2015 at Unknown time  . pantoprazole (PROTONIX) 40 MG tablet TAKE 1 TABLET (40 MG TOTAL) BY MOUTH DAILY AT 12 NOON. 90 tablet 1 06/27/2015 at Unknown time  . potassium chloride SA (K-DUR,KLOR-CON) 20 MEQ tablet Take 1 tablet (20 mEq total) by mouth 2 (two) times daily. 180 tablet 3 06/27/2015 at am  . PROAIR HFA 108 (90 BASE) MCG/ACT inhaler Inhale 2 puffs into the lungs every 6 (six) hours as needed for shortness of breath.   11 Past Month at Unknown time  . Resource Beneprotein PACK Take 3 each by mouth 3 (three) times daily. Take  3g three times a day   06/27/2015 at Unknown time  . temazepam (RESTORIL) 30 MG capsule Take 1 capsule (30 mg total) by mouth at bedtime. 30 capsule 5 06/26/2015 at Unknown time    Assessment: 82 y.o. male with SOB, found to have PE on Lovenox now to switch to Apixaban.  Last dose of Lovenox was at 0536AM.   Goal of Therapy:  Monitor platelets by anticoagulation protocol: Yes   Plan:  D/C Lovenox Apixaban 10mg  po BID x 7 days then 5mg  BID - first dose at 1700 PM.  Monitor CBC and for signs and symptoms of bleeding.  Monitor renal function.   Sloan Leiter, PharmD, BCPS Clinical  Pharmacist [phone]  06/28/2015,2:52 PM

## 2015-06-28 NOTE — ED Notes (Signed)
Phlebotomy at bedside.

## 2015-06-28 NOTE — Care Management Note (Signed)
Case Management Note  Patient Details  Name: [name] [name] MRN: [id number] Date of Birth: [birth year]  Subjective/Objective:                  dyspnea  Action/Plan: CM spoke with patient and family at the bedside. Patient reports he does not weigh himself daily. He has a scale at home. His daughter (present during the conversation) is a Marine scientist. She states she will provide the patient with a easy to use scale at discharge. Patient is agreeable to daily weights and his daughter will follow-up with him. Patient lives at home alone. He is open to home health services if ordered. Daughter states he will not need rehab in a facility since there are four of them available to assist the patient. He has a PCP, Dr. Otilio Miu. Denies any difficulty with affording his mediations or taking his medications as prescribed. He does not have oxygen at home. Awaiting PT/OT evaluation for recommendations.  Expected Discharge Date:    06/30/15              Expected Discharge Plan:  Home/Self Care vs. Home Health   In-House Referral:     Discharge planning Services  CM Consult  Post Acute Care Choice:    Choice offered to:     DME Arranged:    DME Agency:     HH Arranged:    HH Agency:     Status of Service:  In process, will continue to follow  Medicare Important Message Given:    Date Medicare IM Given:    Medicare IM give by:    Date Additional Medicare IM Given:    Additional Medicare Important Message give by:     If discussed at [place] of Stay Meetings, dates discussed:    Additional Comments:  Apolonio Schneiders, RN 06/28/2015, 11:42 AM

## 2015-06-29 LAB — COMPREHENSIVE METABOLIC PANEL
ALBUMIN: 3 g/dL — AB (ref 3.5–5.0)
ALK PHOS: 61 U/L (ref 38–126)
ALT: 17 U/L (ref 17–63)
AST: 47 U/L — ABNORMAL HIGH (ref 15–41)
Anion gap: 13 (ref 5–15)
BUN: 23 mg/dL — ABNORMAL HIGH (ref 6–20)
CALCIUM: 8.9 mg/dL (ref 8.9–10.3)
CO2: 24 mmol/L (ref 22–32)
CREATININE: 0.93 mg/dL (ref 0.61–1.24)
Chloride: 105 mmol/L (ref 101–111)
GFR calc non Af Amer: >60 mL/min (ref >60)
GLUCOSE: 107 mg/dL — AB (ref 65–99)
Potassium: 4.1 mmol/L (ref 3.5–5.1)
SODIUM: 142 mmol/L (ref 135–145)
Total Bilirubin: 2.6 mg/dL — ABNORMAL HIGH (ref 0.3–1.2)
Total Protein: 5 g/dL — ABNORMAL LOW (ref 6.5–8.1)

## 2015-06-29 LAB — CBC
HEMATOCRIT: 24.2 % — AB (ref 39.0–52.0)
HEMOGLOBIN: 7.5 g/dL — AB (ref 13.0–17.0)
MCH: 34.9 pg — AB (ref 26.0–34.0)
MCHC: 31 g/dL (ref 30.0–36.0)
MCV: 112.6 fL — ABNORMAL HIGH (ref 78.0–100.0)
Platelets: 277 10*3/uL (ref 150–400)
RBC: 2.15 MIL/uL — AB (ref 4.22–5.81)
RDW: 20.9 % — ABNORMAL HIGH (ref 11.5–15.5)
WBC: 16.6 10*3/uL — ABNORMAL HIGH (ref 4.0–10.5)

## 2015-06-29 LAB — PROTIME-INR
INR: 1.45 (ref 0.00–1.49)
Prothrombin Time: 17.7 seconds — ABNORMAL HIGH (ref 11.6–15.2)

## 2015-06-29 LAB — SEDIMENTATION RATE: SED RATE: 19 mm/h — AB (ref 0–16)

## 2015-06-29 LAB — FOLATE RBC
Folate, Hemolysate: >620 ng/mL
Folate, RBC: >2844 ng/mL (ref >498)
HEMATOCRIT: 21.8 % — AB (ref 37.5–51.0)

## 2015-06-29 LAB — PATHOLOGIST SMEAR REVIEW

## 2015-06-29 LAB — OCCULT BLOOD X 1 CARD TO LAB, STOOL: Fecal Occult Bld: NEGATIVE

## 2015-06-29 LAB — HEMATOLOGY COMMENTS:

## 2015-06-29 MED ORDER — DEXTROSE 5 % IV SOLN
2.0000 g | Freq: Two times a day (BID) | INTRAVENOUS | Status: DC
  Administered 2015-06-29 – 2015-06-30 (×2): 2 g via INTRAVENOUS
  Filled 2015-06-29 (×3): qty 2

## 2015-06-29 MED ORDER — VANCOMYCIN HCL 500 MG IV SOLR
500.0000 mg | Freq: Two times a day (BID) | INTRAVENOUS | Status: DC
  Administered 2015-06-29 – 2015-06-30 (×5): 500 mg via INTRAVENOUS
  Filled 2015-06-29 (×8): qty 500

## 2015-06-29 NOTE — Discharge Instructions (Signed)
Information on my medicine - ELIQUIS (apixaban)  This medication education was reviewed with me or my healthcare representative as part of my discharge preparation.  The pharmacist that spoke with me during my hospital stay was:  Saundra Shelling, [facility]  Why was Eliquis prescribed for you? Eliquis was prescribed to treat blood clots that may have been found in the veins of your legs (deep vein thrombosis) or in your lungs (pulmonary embolism) and to reduce the risk of them occurring again.  What do You need to know about Eliquis ? The starting dose is 10 mg (two 5 mg tablets) taken TWICE daily for the FIRST SEVEN (7) DAYS, then on (enter date)  07/05/15  the dose is reduced to ONE 5 mg tablet taken TWICE daily.  Eliquis may be taken with or without food.   Try to take the dose about the same time in the morning and in the evening. If you have difficulty swallowing the tablet whole please discuss with your pharmacist how to take the medication safely.  Take Eliquis exactly as prescribed and DO NOT stop taking Eliquis without talking to the doctor who prescribed the medication.  Stopping may increase your risk of developing a new blood clot.  Refill your prescription before you run out.  After discharge, you should have regular check-up appointments with your healthcare provider that is prescribing your Eliquis.    What do you do if you miss a dose? If a dose of ELIQUIS is not taken at the scheduled time, take it as soon as possible on the same day and twice-daily administration should be resumed. The dose should not be doubled to make up for a missed dose.  Important Safety Information A possible side effect of Eliquis is bleeding. You should call your healthcare provider right away if you experience any of the following: ? Bleeding from an injury or your nose that does not stop. ? Unusual colored urine (red or dark brown) or unusual colored stools (red or black). ? Unusual bruising for  unknown reasons. ? A serious fall or if you hit your head (even if there is no bleeding).  Some medicines may interact with Eliquis and might increase your risk of bleeding or clotting while on Eliquis. To help avoid this, consult your healthcare provider or pharmacist prior to using any new prescription or non-prescription medications, including herbals, vitamins, non-steroidal anti-inflammatory drugs (NSAIDs) and supplements.  This website has more information on Eliquis (apixaban): http://www.eliquis.com/eliquis/home

## 2015-06-29 NOTE — Evaluation (Signed)
Physical Therapy Evaluation Patient Details Name: [name] [name] MRN: [id number] DOB: [birth year] Today's Date: 06/29/2015   History of Present Illness  HPI: [name] [name] is a 82 y.o. male with PMH of chronic diastolic CHF, CLL in remission, autoimmune hemolytic anemia previously on prednisone, and polymyositis on CellCept who presents to the ED with 1 week of progressive dyspnea on exertion. Patient lives alone and at his baseline walks for exercise daily, but since the development of exertional dyspnea approximately one week ago, he has been unable to tolerate this. Found to have acute PE, as of 3/20 has been anticoagulated for at least 24 hours  Clinical Impression   Pt admitted with above diagnosis. Pt currently with functional limitations due to the deficits listed below (see PT Problem List).  Pt will benefit from skilled PT to increase their independence and safety with mobility to allow discharge to the venue listed below.    Overall walked well, though noted he benefits from unilateral UE support; will consider cane next session;   SATURATION QUALIFICATIONS: (This note is used to comply with regulatory documentation for home oxygen)  Patient Saturations on Room Air at Rest = 92%  Patient Saturations on Room Air while Ambulating = 90%  Did not need supplemental O2 to keep O2 sats above 88%    Follow Up Recommendations No PT follow up;Other (comment) (Consider HHRN for chronic disease management)    Equipment Recommendations  None recommended by PT    Recommendations for Other Services OT consult (consider for energy conservation as pt lives alone)     Precautions / Restrictions Precautions Precaution Comments: watch O2 sats with amb Restrictions Weight Bearing Restrictions: No      Mobility  Bed Mobility                  Transfers Overall transfer level: Needs assistance Equipment used: None Transfers: Sit to/from Stand Sit to Stand: Min guard          General transfer comment: minguard for safety; noted heavy reliance on UEs to push  Ambulation/Gait Ambulation/Gait assistance: Min guard Ambulation Distance (Feet): 200 Feet Assistive device: None (and pushing Dinamap) Gait Pattern/deviations: Step-through pattern;Trunk flexed     General Gait Details: Cues to self-monitor for activity tolerance; noted occasional reaching out for UE support; will consider cane next session  Stairs            Wheelchair Mobility    Modified Rankin (Stroke Patients Only)       Balance                                             Pertinent Vitals/Pain Pain Assessment: No/denies pain    Home Living Family/patient expects to be discharged to:: Private residence Living Arrangements: Alone Available Help at Discharge: Family;Available PRN/intermittently Type of Home: House Home Access: Stairs to enter   Entrance Stairs-Number of Steps: 3 Home Layout: One level Home Equipment: Walker - 2 wheels;Cane - single point;Shower seat 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; walk in shower    Prior Function Level of Independence: Independent               Hand Dominance   Dominant Hand: Right    Extremity/Trunk Assessment   Upper Extremity Assessment: Overall WFL for tasks assessed           Lower Extremity Assessment:  Generalized weakness      Cervical / Trunk Assessment: Normal  Communication   Communication: No difficulties  Cognition Arousal/Alertness: Awake/alert Behavior During Therapy: WFL for tasks assessed/performed Overall Cognitive Status: Within Functional Limits for tasks assessed                      General Comments      Exercises        Assessment/Plan    PT Assessment Patient needs continued PT services  PT Diagnosis Generalized weakness   PT Problem List Decreased activity tolerance;Decreased balance;Decreased mobility;Cardiopulmonary status  limiting activity  PT Treatment Interventions DME instruction;Gait training;Stair training;Functional mobility training;Therapeutic activities;Therapeutic exercise;Patient/family education   PT Goals (Current goals can be found in the Care Plan section) Acute Rehab PT Goals Patient Stated Goal: Hopes to get planting his garden soon PT Goal Formulation: With patient Time For Goal Achievement: 07/13/15 Potential to Achieve Goals: Good    Frequency Min 3X/week   Barriers to discharge        Co-evaluation               End of Session Equipment Utilized During Treatment: Oxygen (but did not need to turn it on) Activity Tolerance: Patient tolerated treatment well Patient left: in chair;with call bell/phone within reach;with family/visitor present Nurse Communication: Mobility status         Time: 1351-1410 PT Time Calculation (min) (ACUTE ONLY): 19 min   Charges:   PT Evaluation $PT Eval Moderate Complexity: 1 Procedure     PT G CodesQuin [name] 06/29/2015, 2:18 PM  [name] [name], [name] Pager [phone] Office [phone]

## 2015-06-29 NOTE — Progress Notes (Addendum)
[name] [name] [id number] DOB: [birth year] DOA: 06/27/2015 PCP: Otilio Miu, MD  Brief narrative:  16 ? CLL diag 2007 foll. Dr. Oliva Bustard [[place]]-Cellcept/Prednisone-->Rituxan till 02/2015--Has AIHA related to this Prior Thigh cellulitis H/o Polymyositis diag 2009 on Cellcept Raynaud's AoS insuff asymptomatoic bradycardia c RBBB Prior Odontoid + C1 # 2009  Admit [facility] c 3 week subacute h/o exertional dyspnea without Neuromuscular/Volume overload component on itial eval Sats 86% as per PCP who sent patient to ED  Eval revealed vol depletion, BUn/Creat 25:1 CXR neg  CT scan chest + for small PE [ILD related ? Cellcept use] Blood culture x 2 showed gram-positive cocci   Past medical history-As per Problem list Chart reviewed as below-   Consultants:  none  Procedures:  none  Antibiotics:  Vancomycin 3/19   Subjective    Looks feels much better and does not require oxygen now sats are 95% Tolerating diet well Family +  Has had a chronic cough ~ 3 mo    Objective    Interim History:   Telemetry:    Objective: Filed Vitals:   06/28/15 1625 06/28/15 2146 06/29/15 0339 06/29/15 1100  BP: 104/47 102/46 102/47 107/43  Pulse: 64 63 57 57  Temp: 98.5 F (36.9 C) 98.9 F (37.2 C) 98.9 F (37.2 C) 97.5 F (36.4 C)  TempSrc: Oral Oral Oral Oral  Resp: _0 Height:      Weight:   64.5 kg (142 lb 3.2 oz)   SpO2: 93% 95% 95% 96%    Intake/Output Summary (Last 24 hours) at 06/29/15 1315 Last data filed at 06/29/15 0900  Gross per 24 hour  Intake    400 ml  Output      0 ml  Net    400 ml    Exam:  General: eomi ncat Cardiovascular: s1 s 2no m/r/g Respiratory: clear no added soudn Abdomen: soft nt nd no rebound no gaurding Skin no le edema Neuro intact moving x4 limbs well  Data Reviewed: Basic Metabolic Panel:  Recent Labs Lab 06/27/15 2105 06/29/15 0448  NA 139 142  K 4.5 4.1  CL 102 105  CO2 25 24  GLUCOSE 108* 107*  BUN  24* 23*  CREATININE 0.97 0.93  CALCIUM 9.5 8.9   Liver Function Tests:  Recent Labs Lab 06/29/15 0448  AST 47*  ALT 17  ALKPHOS 61  BILITOT 2.6*  PROT 5.0*  ALBUMIN 3.0*   No results for input(s): LIPASE, AMYLASE in the last 168 hours. No results for input(s): AMMONIA in the last 168 hours. CBC:  Recent Labs Lab 06/27/15 2105 06/27/15 2152 06/29/15 6153  WBC 79.4* DUPLICATE REQUEST 32.7*  NEUTROABS 8.3* PENDING  --   HGB 9.5* DUPLICATE REQUEST 7.5*  HCT 61.4* DUPLICATE REQUEST 70.9*  MCV 295.7* DUPLICATE REQUEST 473.4*  PLT 037 DUPLICATE REQUEST 096   Cardiac Enzymes: No results for input(s): CKTOTAL, CKMB, CKMBINDEX, TROPONINI in the last 168 hours. BNP: Invalid input(s): POCBNP CBG: No results for input(s): GLUCAP in the last 168 hours.  Recent Results (from the past 240 hour(s))  Culture, blood (routine x 2)     Status: None (Preliminary result)   Collection Time: 06/28/15  2:25 AM  Result Value Ref Range Status   Specimen Description BLOOD RIGHT HAND  Final   Special Requests BOTTLES DRAWN AEROBIC AND ANAEROBIC 10CC  Final   Culture  Setup Time   Final    AEROBIC BOTTLE ONLY GRAM POSITIVE COCCI IN CLUSTERS CRITICAL RESULT  CALLED TO, READ BACK BY AND VERIFIED WITH: J.CARSON,RN 0246 06/29/15 M.CAMPBELL    Culture PENDING  Incomplete   Report Status PENDING  Incomplete     Studies:              All Imaging reviewed and is as per above notation   Scheduled Meds: . apixaban  10 mg Oral BID   Followed by  . [START ON 07/05/2015] apixaban  5 mg Oral BID  . calcium-vitamin D  1 tablet Oral BID  . cholecalciferol  1,000 Units Oral Daily  . citalopram  40 mg Oral Daily  . ferrous sulfate  325 mg Oral Q breakfast  . folic acid  4 mg Oral Daily  . lisinopril  2.5 mg Oral Daily  . montelukast  10 mg Oral QHS  . multivitamin with minerals  1 tablet Oral Daily  . mycophenolate  500 mg Oral Daily  . omega-3 acid ethyl esters  1 g Oral Q1200  . pantoprazole   40 mg Oral Daily  . protein supplement  1 scoop Oral TID  . sodium chloride flush  3 mL Intravenous Q12H  . temazepam  30 mg Oral QHS  . vancomycin  500 mg Intravenous Q12H   Continuous Infusions:     Assessment/Plan:  1. Acute PE-Risk factor CLL-Lifelong AC with Lovenox/VKA-D/w Dr. Alvy Bimler who confirms that patient should be okay with ~ 6 mo Elliquis and the duration of this medication would need dependent on activity of his CLL. To be determined as an outpatient with Dr. Oliva Bustard.   3.20 ordered doppler lower extremities 2. Gram + cocci on Blood cult-started vancomycin 3/19.  Considering 2/2 d/w ID Dr. Truett Mainland ESR/CRP and re-ordered Echo 3. Acute hypoxic resp failure-2/2 to Pulm/septic embolism given h/o CLL.  Follow cultures 4. Polymyositis on Cellcept-Stable.  OP consideration per Dr. Jannifer Franklin re: changing Cellcept-patient relates in the past year this was decreased for other intolerances 5. AoS insuff-stable currently and non contributory 6. Mild Vol depletion-Bun/Creat, 24/0.97-->rpt labs in am cont saline @ 50 cc/hr until 06/29/15 7. Asymptomatic bradycardia RBBB-continue telemetry recheck in a.m. 8. Anemia-continue ferrous sulfate 325 every morning 9. Depression continue citalopram 40 daily, temazepam 30 daily at bedtime 10. Hypertension continue lisinopril 2.5 daily 11. Reflux continue pantoprazole 40 daily 12. Osteoporosis continue calcium plus vitamin D as well as cholecalciferol    25 min Unclear dispo.  Pending work-up for Bacteremia   Verneita Griffes, MD  [facility] Pager [phone] 06/29/2015, 1:15 PM    LOS: 1 day

## 2015-06-29 NOTE — Progress Notes (Signed)
CRITICAL VALUE ALERT  Critical value received:  Blood Cultures: Gram + Cocci in Clusters in Anaerobic Bottle  Date of notification:  06/29/2015  Time of notification:  1600  Critical value read back:Yes.    Nurse who received alert:  Kathrine Cords, RN  MD notified (1st page):  Dr. Verlon Au  Time of first page:  1603  Responding MD:  Dr. Verlon Au  Time MD responded:  [phone]

## 2015-06-29 NOTE — Evaluation (Signed)
Occupational Therapy Evaluation and Discharge Patient Details Name: [name] [name] MRN: [id number] DOB: 04/26/1929 Today's Date: 06/29/2015    History of Present Illness HPI: [name] [name] is a 82 y.o. male with PMH of chronic diastolic CHF, CLL in remission, autoimmune hemolytic anemia previously on prednisone, and polymyositis on CellCept who presents to the ED with 1 week of progressive dyspnea on exertion. Patient lives alone and at his baseline walks for exercise daily, but since the development of exertional dyspnea approximately one week ago, he has been unable to tolerate this. Found to have acute PE, as of 3/20 has been anticoagulated for at least 24 hours   Clinical Impression   This 82 yo male admitted with above presents to acute OT with all education completed and no further OT needs. We will sign off.    Follow Up Recommendations  No OT follow up    Equipment Recommendations  None recommended by OT       Precautions / Restrictions Precautions Precaution Comments: watch O2 sats with amb Restrictions Weight Bearing Restrictions: No              ADL                                         General ADL Comments: Spoke to PT before entering room and she reported that pt could benefit from energy conservation. Provided handout to pt and dtr who was in the room with him. I went over some to points that pertain to him the most (ie: stitting v. standing to do tasks if he can --Use shower seat that he has, use stool in kitichen to sit on to prepare food/wash dishes). Do not keep hands submerged in hot water to wash dishes--cooler water with rinsing wtih hot. Pay attention to his activity tolerance and rest when his body tells him to rest; dont' try to do too much at once/break tasks apart (ie; bath-rest-dress); edcuated on purse lipped breathing;as well as other ideas on handout               Pertinent Vitals/Pain Pain Assessment: No/denies pain      Hand Dominance Right   Extremity/Trunk Assessment Upper Extremity Assessment Upper Extremity Assessment: Overall WFL for tasks assessed   Lower Extremity Assessment Lower Extremity Assessment: Generalized weakness   Cervical / Trunk Assessment Cervical / Trunk Assessment: Normal   Communication Communication Communication: No difficulties   Cognition Arousal/Alertness: Awake/alert Behavior During Therapy: WFL for tasks assessed/performed Overall Cognitive Status: Within Functional Limits for tasks assessed                                Home Living Family/patient expects to be discharged to:: Private residence Living Arrangements: Alone Available Help at Discharge: Family;Available PRN/intermittently Type of Home: House Home Access: Stairs to enter [employer] of Steps: 3 Entrance Stairs-Rails: Left Home Layout: One level     Bathroom Shower/Tub: Walk-in shower;Door   [employer] Toilet: Standard     Home Equipment: Environmental consultant - 2 wheels;Cane - single point;Shower seat;Hand held shower head   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; walk in shower      Prior Functioning/Environment Level of Independence: Independent             OT Diagnosis: Generalized weakness  OT Goals(Current goals can be found in the care plan section) Acute Rehab OT Goals Patient Stated Goal: hopes to get home and back to preparing his garden soon  OT Frequency:                End of Session Nurse Communication:  (Ok'd to remove O2 and monitor on RA with continuous pulse ox; OK to get up to bathroom by himsef)  Activity Tolerance: Patient tolerated treatment well Patient left: in chair;with call bell/phone within reach;with family/visitor present   Time: [id number] OT Time Calculation (min): 20 min Charges:  OT Evaluation $OT Eval Low Complexity: 1 Procedure  Almon Register [id number] 06/29/2015, 5:07 PM

## 2015-06-29 NOTE — Progress Notes (Signed)
Pharmacy Antibiotic Note  [name] [name] is a 82 y.o. male admitted on 06/27/2015 with pulmonary embolus.  Pharmacy has been consulted for Vancomycin dosing for 1/2 positive blood cultures with gram + cocci in clusters. Of note, WBC was elevated on 3/18. Renal function age appropriate.   Plan: -Vancomycin 500 mg IV q12h -Trend WBC, temp, renal function  -Drug levels at steady state -F/U blood cultures   Height: 5\' 5"  (165.1 cm) Weight: 138 lb 3.7 oz (62.7 kg) IBW/kg (Calculated) : 61.5  Temp (24hrs), Avg:98.6 F (37 C), Min:98.3 F (36.8 C), Max:98.9 F (37.2 C)   Recent Labs Lab 06/27/15 2105 06/27/15 2152 06/28/15 0248 06/28/15 [id number]  WBC [id number]* DUPLICATE REQUEST  --   --   CREATININE 0.97  --   --   --   LATICACIDVEN  --   --  0.8 0.8    Estimated Creatinine Clearance: 50.2 mL/min (by C-G formula based on Cr of 0.97).    Allergies  Allergen Reactions  . Sulfonamide Derivatives Itching and Rash    [name] [name] 06/29/2015 3:25 AM

## 2015-06-29 NOTE — Progress Notes (Signed)
Nutrition Brief Note  RD consulted to assess nutrient needs/status per COPD protocol.  Wt Readings from Last 15 Encounters:  06/29/15 142 lb 3.2 oz (64.5 kg)  06/18/15 124 lb (56.246 kg)  06/04/15 131 lb 13.4 oz (59.8 kg)  05/11/15 131 lb (59.421 kg)  04/23/15 133 lb 13.1 oz (60.7 kg)  04/15/15 136 lb (61.689 kg)  03/26/15 131 lb 13.4 oz (59.8 kg)  03/17/15 132 lb 12 oz (60.215 kg)  02/26/15 130 lb 11.7 oz (59.3 kg)  01/29/15 128 lb 4.9 oz (58.2 kg)  01/13/15 126 lb 12.2 oz (57.5 kg)  01/06/15 131 lb (59.421 kg)  01/01/15 129 lb 8 oz (58.741 kg)  12/29/14 130 lb (58.968 kg)  12/25/14 130 lb 2 oz (59.024 kg)   [name] [name] is a 82 y.o. male with PMH of chronic diastolic CHF, CLL in remission, autoimmune hemolytic anemia previously on prednisone, and polymyositis on CellCept who presents to the ED with 1 week of progressive dyspnea on exertion. Patient lives alone and at his baseline walks for exercise daily, but since the development of exertional dyspnea approximately one week ago, he has been unable to tolerate this.   Pt admitted with acute on chronic respiratory failure.   Attempted to see pt x 2, however, working with therapy at time of visit.  Wt hx reviewed. Noted weight stability over the past year.  Body mass index is 23.66 kg/(m^2). Patient meets criteria for normal weight range based on current BMI.   Current diet order is Heart Healthy, patient is consuming approximately 50% of meals at this time. Labs and medications reviewed.   No nutrition interventions warranted at this time. If nutrition issues arise, please consult RD.   [name] [name] A. Jimmye Norman, RD, LDN, CDE Pager: [phone] After hours Pager: [phone]

## 2015-06-29 NOTE — Progress Notes (Signed)
Pharmacy Antibiotic Note  [name] [name] is a 82 y.o. male admitted on 06/27/2015 with pulmonary embolus.  Pharmacy has been consulted for ceftriaxone dosing for meningitis. Pharmacy is also dosing vancomycin   Plan: -Start ceftriaxone 2 grams IV q 12 hr -Continue vancomycin 500 mg q 12 hr -Monitor clinical progress, c/s, renal function, abx plan/LOT VT@SS  as indicated   Height: 5\' 5"  (165.1 cm) Weight: 142 lb 3.2 oz (64.5 kg) IBW/kg (Calculated) : 61.5  Temp (24hrs), Avg:98.6 F (37 C), Min:97.5 F (36.4 C), Max:100 F (37.8 C)   Recent Labs Lab 06/27/15 2105 06/27/15 2152 06/28/15 0248 06/28/15 0408 06/29/15 [id number]  WBC [id number]* DUPLICATE REQUEST  --   --  16.6*  CREATININE 0.97  --   --   --  0.93  LATICACIDVEN  --   --  0.8 0.8  --     Estimated Creatinine Clearance: 52.4 mL/min (by C-G formula based on Cr of 0.93).    Allergies  Allergen Reactions  . Sulfonamide Derivatives Itching and Rash    3/20 vancomycin>> 3/20 ceftriaxone>>  Microbiology: 3/19 Resp virus panel>> sent 3/19 BC x 2>> GPC in clusters   Thank you for allowing [place] to participate in this patients care. Jens Som, PharmD Pager: [phone] 06/29/2015 9:05 PM

## 2015-06-30 ENCOUNTER — Inpatient Hospital Stay (HOSPITAL_COMMUNITY): Payer: Medicare Other

## 2015-06-30 DIAGNOSIS — R7881 Bacteremia: Secondary | ICD-10-CM

## 2015-06-30 DIAGNOSIS — I82409 Acute embolism and thrombosis of unspecified deep veins of unspecified lower extremity: Secondary | ICD-10-CM

## 2015-06-30 LAB — BASIC METABOLIC PANEL
ANION GAP: 7 (ref 5–15)
BUN: 24 mg/dL — ABNORMAL HIGH (ref 6–20)
CALCIUM: 8.9 mg/dL (ref 8.9–10.3)
CO2: 23 mmol/L (ref 22–32)
Chloride: 108 mmol/L (ref 101–111)
Creatinine, Ser: 0.94 mg/dL (ref 0.61–1.24)
GFR calc non Af Amer: >60 mL/min (ref >60)
Glucose, Bld: 96 mg/dL (ref 65–99)
POTASSIUM: 4.1 mmol/L (ref 3.5–5.1)
SODIUM: 138 mmol/L (ref 135–145)

## 2015-06-30 LAB — CBC WITH DIFFERENTIAL/PLATELET
BASOS PCT: 1 %
Basophils Absolute: 0.2 10*3/uL — ABNORMAL HIGH (ref 0.0–0.1)
EOS PCT: 4 %
Eosinophils Absolute: 0.7 10*3/uL (ref 0.0–0.7)
HEMATOCRIT: 26 % — AB (ref 39.0–52.0)
HEMOGLOBIN: 8.5 g/dL — AB (ref 13.0–17.0)
LYMPHS PCT: 44 %
Lymphs Abs: 7.2 10*3/uL — ABNORMAL HIGH (ref 0.7–4.0)
MCH: 37.4 pg — AB (ref 26.0–34.0)
MCHC: 32.7 g/dL (ref 30.0–36.0)
MCV: 114.5 fL — ABNORMAL HIGH (ref 78.0–100.0)
MONO ABS: 3 10*3/uL — AB (ref 0.1–1.0)
MONOS PCT: 18 %
NEUTROS PCT: 33 %
Neutro Abs: 5.4 10*3/uL (ref 1.7–7.7)
Platelets: 328 10*3/uL (ref 150–400)
RBC: 2.27 MIL/uL — AB (ref 4.22–5.81)
RDW: 23.1 % — ABNORMAL HIGH (ref 11.5–15.5)
WBC: 16.5 10*3/uL — AB (ref 4.0–10.5)

## 2015-06-30 LAB — ECHOCARDIOGRAM COMPLETE
Height: 65 in
Weight: 2275.15 oz

## 2015-06-30 LAB — PROCALCITONIN: Procalcitonin: 0.13 ng/mL

## 2015-06-30 MED ORDER — FUROSEMIDE 40 MG PO TABS
40.0000 mg | ORAL_TABLET | Freq: Every day | ORAL | Status: DC
  Administered 2015-07-01 – 2015-07-03 (×3): 40 mg via ORAL
  Filled 2015-06-30 (×3): qty 1

## 2015-06-30 MED ORDER — FUROSEMIDE 20 MG PO TABS
20.0000 mg | ORAL_TABLET | Freq: Once | ORAL | Status: AC
Start: 2015-06-30 — End: 2015-06-30
  Administered 2015-06-30: 20 mg via ORAL
  Filled 2015-06-30: qty 1

## 2015-06-30 MED ORDER — DEXTROSE 5 % IV SOLN
2.0000 g | INTRAVENOUS | Status: DC
Start: 2015-07-01 — End: 2015-06-30

## 2015-06-30 MED ORDER — DEXTROSE 5 % IV SOLN: 2.0000 g | INTRAVENOUS | Status: DC

## 2015-06-30 NOTE — Progress Notes (Signed)
  Echocardiogram 2D Echocardiogram has been performed.  Darlina Sicilian M 06/30/2015, 1:24 PM

## 2015-06-30 NOTE — Consult Note (Signed)
Referring Physician: Dr. Jai-Gurmukh Samtani  [name] [name] is an 82 y.o. male.                       Chief Complaint: Bacteremia  HPI: 82 year old male with gram positive bacteremia has need for TEE to r/o vegetation.  Past Medical History  Diagnosis Date  . CLL (chronic lymphoblastic leukemia) 2006    Rituxan treatment 2006  . Atrial septal aneurysm 2009    Insignificant, no, January, 2009  . Infection of PEG site (HCC)     cellulitis..Resolved  . Vertebral compression fracture (HCC)     multiple levels  . Herpes zoster     right chest  . Aortic insufficiency 2009    mild, Mild, echo, 2012  . Diastolic dysfunction     mild  . Chest pain     EF 55%, echo, October, 2009, possible inferior and posterior borderline hypokinesis, patient has never  had cardiac catheterization  . Edema     EF 55%, echo, October, 2009, possible borderline inferior and posterior hypokinesis... heart catheterization has never been done(July 10, 2009)  . Myositis     Caused pulmonary insufficiency with muscle weakness in the past / Dr.Willis-neurology, Imuran and prednisone  . Calculus of kidney   . Inguinal hernia without mention of obstruction or gangrene, unilateral or unspecified, (not specified as recurrent)     right   . Unspecified gastritis and gastroduodenitis without mention of hemorrhage   . Abdominal pain, epigastric   . Nonspecific elevation of levels of transaminase or lactic acid dehydrogenase (LDH)   . Feeding difficulties and mismanagement   . Edema of male genital organs     Resolved, occurred during illness related to myositis  . Swelling of arm     left...Resolved.... no DVT  . LFT elevation     Related to Imuran in the past... dose was adjusted  . Dyslipidemia     statins not used due to LFT abnormalities  . Shortness of breath     Breathing abnormalities related to muscle weakness from myositis  . RBBB (right bundle branch block)     Old  . Bradycardia     Sinus  bradycardia, old  . Ejection fraction     EF 60%, echo, October, 2012  . Mitral regurgitation     Mild / moderate, echo, 2012  . Right ventricular dysfunction     Mild, echo, 2012  . Febrile illness     May, 2013, question sinusitis  . Polymyositis (HCC) 11/26/2012  . Hemolytic anemia (HCC)   . Cellulitis   . Chronic diastolic CHF (congestive heart failure) (HCC) 03/17/2015      Past Surgical History  Procedure Laterality Date  . Splenectomy    . Hernia repair      left side  . Peg placement  06/2007  . Peg tube removal  12/2007  . Kyphosis surgery      multilevel vertebral  . Odontoid fracture surgery      anterior screw fixation  . Nasal sinus surgery      x2  . Cataract extraction      bi lateral    Family History  Problem Relation Age of Onset  . Breast cancer Sister   . Colon cancer Brother   . Prostate cancer Brother   . Stroke Mother   . Hypertension Mother   . Emphysema Father   . Diabetes Grandchild   . Heart attack Neg   Hx   . Cancer Brother   . Cancer Sister   . Cancer Daughter    Social History:  reports that he quit smoking about 47 years ago. His smoking use included Cigarettes. He has a 15.5 pack-year smoking history. He has quit using smokeless tobacco. His smokeless tobacco use included Chew. He reports that he does not drink alcohol. His drug history is not on file.  Allergies:  Allergies  Allergen Reactions  . Sulfonamide Derivatives Itching and Rash    Medications Prior to Admission  Medication Sig Dispense Refill  . albuterol (PROVENTIL) (2.5 MG/3ML) 0.083% nebulizer solution Take 3 mLs (2.5 mg total) by nebulization every 6 (six) hours as needed for wheezing or shortness of breath. 75 mL 12  . calcium-vitamin D (CALCIUM 500+D) 500-200 MG-UNIT per tablet Take 1 tablet by mouth 2 (two) times daily.      . Cholecalciferol (VITAMIN D3) 1000 UNITS tablet Take 1,000 Units by mouth daily.      . citalopram (CELEXA) 40 MG tablet TAKE 1 TABLET (40  MG TOTAL) BY MOUTH DAILY. 90 tablet 0  . cyanocobalamin (,VITAMIN B-12,) 1000 MCG/ML injection Inject 1 mL (1,000 mcg total) into the muscle every 14 (fourteen) days. At the first of the month (Patient taking differently: Inject 1,000 mcg into the muscle every 14 (fourteen) days. Takes every other sunday) 10 mL 3  . ferrous sulfate 325 (65 FE) MG tablet Take 325 mg by mouth daily with breakfast.      . fish oil-omega-3 fatty acids 1000 MG capsule Take 1 g by mouth daily at 12 noon.     . folic acid (FOLVITE) 1 MG tablet Take 4 tablets (4 mg total) by mouth daily. 120 tablet 0  . furosemide (LASIX) 80 MG tablet Take 1 tablet (80 mg total) by mouth daily. 90 tablet 3  . HYDROcodone-acetaminophen (NORCO/VICODIN) 5-325 MG tablet Take 1 tablet by mouth 3 (three) times daily. 30 tablet 0  . montelukast (SINGULAIR) 10 MG tablet Take 1 tablet (10 mg total) by mouth at bedtime. 1 tablet 11  . Multiple Vitamin (MULTIVITAMIN) capsule Take 1 capsule by mouth daily.      . mycophenolate (CELLCEPT) 500 MG tablet Take 1 tablet (500 mg total) by mouth daily. 90 tablet 3  . nystatin cream (MYCOSTATIN) APPLY TOPICALLY TWICE DAILY 30 g 0  . pantoprazole (PROTONIX) 40 MG tablet TAKE 1 TABLET (40 MG TOTAL) BY MOUTH DAILY AT 12 NOON. 90 tablet 1  . potassium chloride SA (K-DUR,KLOR-CON) 20 MEQ tablet Take 1 tablet (20 mEq total) by mouth 2 (two) times daily. 180 tablet 3  . PROAIR HFA 108 (90 BASE) MCG/ACT inhaler Inhale 2 puffs into the lungs every 6 (six) hours as needed for shortness of breath.   11  . Resource Beneprotein PACK Take 3 each by mouth 3 (three) times daily. Take 3g three times a day    . temazepam (RESTORIL) 30 MG capsule Take 1 capsule (30 mg total) by mouth at bedtime. 30 capsule 5    Results for orders placed or performed during the hospital encounter of 06/27/15 (from the past 48 hour(s))  CBC     Status: Abnormal   Collection Time: 06/29/15  4:48 AM  Result Value Ref Range   WBC 16.6 (H) 4.0 -  10.5 K/uL   RBC 2.15 (L) 4.22 - 5.81 MIL/uL   Hemoglobin 7.5 (L) 13.0 - 17.0 g/dL    Comment: DELTA CHECK NOTED REPEATED TO VERIFY      HCT 24.2 (L) 39.0 - 52.0 %   MCV 112.6 (H) 78.0 - 100.0 fL   MCH 34.9 (H) 26.0 - 34.0 pg   MCHC 31.0 30.0 - 36.0 g/dL   RDW 20.9 (H) 11.5 - 15.5 %   Platelets 277 150 - 400 K/uL  Comprehensive metabolic panel     Status: Abnormal   Collection Time: 06/29/15  4:48 AM  Result Value Ref Range   Sodium 142 135 - 145 mmol/L   Potassium 4.1 3.5 - 5.1 mmol/L   Chloride 105 101 - 111 mmol/L   CO2 24 22 - 32 mmol/L   Glucose, Bld 107 (H) 65 - 99 mg/dL   BUN 23 (H) 6 - 20 mg/dL   Creatinine, Ser 0.93 0.61 - 1.24 mg/dL   Calcium 8.9 8.9 - 10.3 mg/dL   Total Protein 5.0 (L) 6.5 - 8.1 g/dL   Albumin 3.0 (L) 3.5 - 5.0 g/dL   AST 47 (H) 15 - 41 U/L   ALT 17 17 - 63 U/L   Alkaline Phosphatase 61 38 - 126 U/L   Total Bilirubin 2.6 (H) 0.3 - 1.2 mg/dL   GFR calc non Af Amer >60 >60 mL/min   GFR calc Af Amer >60 >60 mL/min    Comment: (NOTE) The eGFR has been calculated using the CKD EPI equation. This calculation has not been validated in all clinical situations. eGFR's persistently <60 mL/min signify possible Chronic Kidney Disease.    Anion gap 13 5 - 15  Protime-INR     Status: Abnormal   Collection Time: 06/29/15  4:48 AM  Result Value Ref Range   Prothrombin Time 17.7 (H) 11.6 - 15.2 seconds   INR 1.45 0.00 - 1.49  Occult blood card to lab, stool RN will collect     Status: None   Collection Time: 06/29/15 10:28 AM  Result Value Ref Range   Fecal Occult Bld NEGATIVE NEGATIVE  Sedimentation rate     Status: Abnormal   Collection Time: 06/29/15  5:07 PM  Result Value Ref Range   Sed Rate 19 (H) 0 - 16 mm/hr  Basic metabolic panel     Status: Abnormal   Collection Time: 06/30/15  4:26 AM  Result Value Ref Range   Sodium 138 135 - 145 mmol/L   Potassium 4.1 3.5 - 5.1 mmol/L   Chloride 108 101 - 111 mmol/L   CO2 23 22 - 32 mmol/L   Glucose, Bld  96 65 - 99 mg/dL   BUN 24 (H) 6 - 20 mg/dL   Creatinine, Ser 0.94 0.61 - 1.24 mg/dL   Calcium 8.9 8.9 - 10.3 mg/dL   GFR calc non Af Amer >60 >60 mL/min   GFR calc Af Amer >60 >60 mL/min    Comment: (NOTE) The eGFR has been calculated using the CKD EPI equation. This calculation has not been validated in all clinical situations. eGFR's persistently <60 mL/min signify possible Chronic Kidney Disease.    Anion gap 7 5 - 15  Procalcitonin     Status: None   Collection Time: 06/30/15  4:26 AM  Result Value Ref Range   Procalcitonin 0.13 ng/mL    Comment:        Interpretation: PCT (Procalcitonin) <= 0.5 ng/mL: Systemic infection (sepsis) is not likely. Local bacterial infection is possible. (NOTE)         ICU PCT Algorithm               Non ICU PCT Algorithm    ----------------------------     ------------------------------           PCT < 0.25 ng/mL                 PCT < 0.1 ng/mL     Stopping of antibiotics            Stopping of antibiotics       strongly encouraged.               strongly encouraged.    ----------------------------     ------------------------------       PCT level decrease by               PCT < 0.25 ng/mL       >= 80% from peak PCT       OR PCT 0.25 - 0.5 ng/mL          Stopping of antibiotics                                             encouraged.     Stopping of antibiotics           encouraged.    ----------------------------     ------------------------------       PCT level decrease by              PCT >= 0.25 ng/mL       < 80% from peak PCT        AND PCT >= 0.5 ng/mL            Continuin g antibiotics                                              encouraged.       Continuing antibiotics            encouraged.    ----------------------------     ------------------------------     PCT level increase compared          PCT > 0.5 ng/mL         with peak PCT AND          PCT >= 0.5 ng/mL             Escalation of antibiotics                                           strongly encouraged.      Escalation of antibiotics        strongly encouraged.   CBC with Differential/Platelet     Status: Abnormal   Collection Time: 06/30/15  6:20 PM  Result Value Ref Range   WBC 16.5 (H) 4.0 - 10.5 K/uL   RBC 2.27 (L) 4.22 - 5.81 MIL/uL   Hemoglobin 8.5 (L) 13.0 - 17.0 g/dL   HCT 26.0 (L) 39.0 - 52.0 %   MCV 114.5 (H) 78.0 - 100.0 fL   MCH 37.4 (H) 26.0 - 34.0 pg   MCHC 32.7 30.0 - 36.0 g/dL   RDW 23.1 (H) 11.5 - 15.5 %   Platelets 328 150 - 400 K/uL   Neutrophils Relative % 33 %   Lymphocytes Relative 44 %   Monocytes Relative 18 %   Eosinophils Relative   4 %   Basophils Relative 1 %   Neutro Abs 5.4 1.7 - 7.7 K/uL   Lymphs Abs 7.2 (H) 0.7 - 4.0 K/uL   Monocytes Absolute 3.0 (H) 0.1 - 1.0 K/uL   Eosinophils Absolute 0.7 0.0 - 0.7 K/uL   Basophils Absolute 0.2 (H) 0.0 - 0.1 K/uL   No results found.  Review Of Systems General: no fevers, sweats, weight change, poor appetite, or fatigue. + Chills. HEENT: no blurry vision, hearing changes or sore throat Pulm: no wheeze. + Dyspnea, cough.  CV: no chest pain or palpitations Abd: no nausea, vomiting, abdominal pain, or constipation. + Diarrhea.  GU: no dysuria, hematuria, increased urinary frequency, or urgency. + Darkened urine.  Ext: no leg edema Neuro: no focal weakness, numbness, or tingling, no vision change or hearing loss Skin: no rash, no wounds MSK: No muscle spasm, no deformity, no red, hot, or swollen joint Heme: No easy bruising or bleeding Travel history: No recent long distant travel   Blood pressure 113/40, pulse 64, temperature 98.4 F (36.9 C), temperature source Oral, resp. rate 20, height 5' 5" (1.651 m), weight 66.951 kg (147 lb 9.6 oz), SpO2 96 %. General: No respiratory distress. Averagely built and nourished  HEENT: Eyes: PERRL, EOMI, no scleral icterus or conjunctival pallor. ENT: No discharge from the ears or nose, no pharyngeal ulcers, oral mucosa moist. Neck: No  JVD, no bruit, no appreciable mass Heme: No cervical adenopathy, no pallor Cardiac: S1/S2, RRR, grade III crescendo-decrescendo murmur at LSB, No gallops or rubs. Pulm: Good air movement bilaterally. No rales, wheezing, rhonchi or rubs. Abd: Soft, nondistended, nontender, no rebound pain or gaurding, BS present. Ext: No LE edema bilaterally. 2+DP/PT pulse bilaterally. Musculoskeletal: No gross deformity, no red, hot, swollen joints  Skin: No rashes or wounds on exposed surfaces  Neuro: Alert, oriented X3, cranial nerves II-XII grossly intact. No focal findings Psych: Patient is not overtly psychotic, appropriate mood and affect.  Assessment/Plan Gram positive bacteremia R/O endocarditis Chronic diastolic heart failure H/O macrocytic anemia Polymyositis  TEE in AM. NPO post mid night.   Erin Obando S, MD  06/30/2015, 10:48 PM     

## 2015-06-30 NOTE — Progress Notes (Signed)
[name] [name] NLZ:[id number] DOB: [birth year] DOA: 06/27/2015 PCP: Otilio Miu, MD  Brief narrative:   27 ? CLL diag 2007 foll. Dr. Oliva Bustard [[place]]-Cellcept/Prednisone-->Rituxan till 02/2015--Has AIHA related to this Prior Thigh cellulitis H/o Polymyositis diag 2009 on Cellcept Raynaud's AoS insuff asymptomatoic bradycardia c RBBB Prior Odontoid + C1 # 2009  Admit [facility] c 3 week subacute h/o exertional dyspnea without Neuromuscular/Volume overload component on itial eval Sats 86% as per PCP who sent patient to ED  Eval revealed vol depletion, BUn/Creat 25:1 CXR neg  CT scan chest + for small PE [ILD related ? Cellcept use] Blood culture x 2 showed gram-positive cocci  TTE EchO neg   Needs TEE   Past medical history-As per Problem list Chart reviewed as below-   Consultants:  none  Procedures:  Doppler  ECHO  Antibiotics:  Vancomycin 3/19   Subjective    Feels poorly Somewhat short of breath  O2 sat 95% ankles feel swollen No cp Eating fair    Objective    Interim History:   Telemetry:    Objective: Filed Vitals:   06/29/15 2219 06/30/15 0640 06/30/15 1421 06/30/15 1853  BP:  101/44 113/40   Pulse:  63 64   Temp: 100 F (37.8 C) 98.6 F (37 C) 98.4 F (36.9 C)   TempSrc: Oral Oral Oral   Resp:  19 20   Height:      Weight:    66.951 kg (147 lb 9.6 oz)  SpO2:  96% 96% 96%    Intake/Output Summary (Last 24 hours) at 06/30/15 1854 Last data filed at 06/30/15 1700  Gross per 24 hour  Intake    993 ml  Output    300 ml  Net    693 ml    Exam:  General: eomi ncat Cardiovascular: s1 s 2no m/r/g Respiratory: clear no added soudn Abdomen: soft nt nd no rebound no gaurding Skin no grade 2 foot edema Neuro intact moving x4 limbs well  Data Reviewed: Basic Metabolic Panel:  Recent Labs Lab 06/27/15 2105 06/29/15 0448 06/30/15 0426  NA 139 142 138  K 4.5 4.1 4.1  CL 102 105 108  CO2 25 24 23   GLUCOSE 108* 107* 96  BUN  24* 23* 24*  CREATININE 0.97 0.93 0.94  CALCIUM 9.5 8.9 8.9   Liver Function Tests:  Recent Labs Lab 06/29/15 0448  AST 47*  ALT 17  ALKPHOS 61  BILITOT 2.6*  PROT 5.0*  ALBUMIN 3.0*   No results for input(s): LIPASE, AMYLASE in the last 168 hours. No results for input(s): AMMONIA in the last 168 hours. CBC:  Recent Labs Lab 06/27/15 2105 06/27/15 2152 06/28/15 0408 06/29/15 0448 06/30/15 9024  WBC 09.7* DUPLICATE REQUEST  --  35.3* 16.5*  NEUTROABS 8.3* PENDING  --   --  PENDING  HGB 9.5* DUPLICATE REQUEST  --  7.5* 8.5*  HCT 29.9* DUPLICATE REQUEST 24.2* 24.2* 26.0*  MCV 683.4* DUPLICATE REQUEST  --  196.2* 229.7*  PLT 989 DUPLICATE REQUEST  --  211 328   Cardiac Enzymes: No results for input(s): CKTOTAL, CKMB, CKMBINDEX, TROPONINI in the last 168 hours. BNP: Invalid input(s): POCBNP CBG: No results for input(s): GLUCAP in the last 168 hours.  Recent Results (from the past 240 hour(s))  Culture, blood (routine x 2)     Status: None (Preliminary result)   Collection Time: 06/28/15  2:25 AM  Result Value Ref Range Status   Specimen Description BLOOD RIGHT HAND  Final  Special Requests BOTTLES DRAWN AEROBIC AND ANAEROBIC 10CC  Final   Culture  Setup Time   Final    AEROBIC BOTTLE ONLY GRAM POSITIVE COCCI IN CLUSTERS CRITICAL RESULT CALLED TO, READ BACK BY AND VERIFIED WITH: J.CARSON,RN 0246 06/29/15 M.CAMPBELL    Culture STAPHYLOCOCCUS SPECIES (COAGULASE NEGATIVE)  Final   Report Status PENDING  Incomplete  Culture, blood (routine x 2)     Status: None (Preliminary result)   Collection Time: 06/28/15  2:30 AM  Result Value Ref Range Status   Specimen Description BLOOD LEFT ARM  Final   Special Requests BOTTLES DRAWN AEROBIC AND ANAEROBIC 10CC  Final   Culture  Setup Time   Final    GRAM POSITIVE COCCI IN CLUSTERS ANAEROBIC BOTTLE ONLY CRITICAL RESULT CALLED TO, READ BACK BY AND VERIFIED WITH: T COOK 1556 06/29/15 A BROWNING    Culture TOO YOUNG TO READ   Final   Report Status PENDING  Incomplete     Studies:              All Imaging reviewed and is as per above notation   Scheduled Meds: . apixaban  10 mg Oral BID   Followed by  . [START ON 07/05/2015] apixaban  5 mg Oral BID  . calcium-vitamin D  1 tablet Oral BID  . [START ON 07/01/2015] cefTRIAXone (ROCEPHIN)  IV  2 g Intravenous Q24H  . cholecalciferol  1,000 Units Oral Daily  . citalopram  40 mg Oral Daily  . ferrous sulfate  325 mg Oral Q breakfast  . folic acid  4 mg Oral Daily  . montelukast  10 mg Oral QHS  . multivitamin with minerals  1 tablet Oral Daily  . mycophenolate  500 mg Oral Daily  . omega-3 acid ethyl esters  1 g Oral Q1200  . pantoprazole  40 mg Oral Daily  . protein supplement  1 scoop Oral TID  . sodium chloride flush  3 mL Intravenous Q12H  . temazepam  30 mg Oral QHS  . vancomycin  500 mg Intravenous Q12H   Continuous Infusions:     Assessment/Plan:  1. Acute PE Versus septic emboli-Risk factor CLL-D/w Dr. Alvy Bimler 3/19 who confirms that patient should be okay with ~ 6 mo Elliquis and the duration of this medication would need dependent on activity of his CLL. To be determined as an outpatient with Dr. Oliva Bustard.   3.20 ordered doppler lower extremities are inconsistent with thick blood flow-please look for final report by vascular surgeon.  follow CXR 2 vw 3/21 to rule out PNA 2. Gram + cocci [ on Blood cult coag neg staph x 1 bottle]-started vancomycin 3/19.  Considering 2/2 bottles d/w ID Dr. Truett Mainland ESR/CRP and Echo. Echocardiogram showed no specific vegetation however given immunosuppression I discussed once again with infectious disease Dr. Megan Salon who thinks risk of right-sided endocarditis is high and recommends transition only to vancomycin.  repeat blood cultures 3/21 am to document clearing of bacteremia and then we can determine when to place a PICC line. I contacted Dr.Kadakia who will see him in consult and arrange for TEE 3. Acute hypoxic resp  failure-2/2 to Pulm/septic embolism given h/o CLL.  Follow cultures from 3.19 and from 3/21 am 4. Polymyositis on Cellcept-Stable.  OP consideration per Dr. Jannifer Franklin re: changing Cellcept-patient relates in the past year this was decreased for other intolerances. 5. CLL-LDH lower than baseline.  Hemoglobin stable 7-8 range 6. AoS insuff-stable currently and non contributory 7. Mild Vol depletion-Bun/Creat,  24/0.97-->rpt labs in am cont saline @ 50 cc/hr until 06/29/15-rpt labs am.   8. Mild decompensated diastolic heart failure-resume Lasix low-dose 20 mg by mouth once 3/21-titrate to 40 mg qd in a.m. He is not dry but ankles are swollen. 9. Asymptomatic bradycardia RBBB-stable 10. Anemia-continue ferrous sulfate 325 every morning 11. Depression continue citalopram 40 daily, temazepam 30 daily at bedtime 12. Hypertension continue lisinopril 2.5 daily 13. Reflux continue pantoprazole 40 daily 14. Osteoporosis continue calcium plus vitamin D as well as cholecalciferol  apixaban Full code  45 min Explained to patient and daughters at bedside  Pending work-up for Bacteremia-need PICC line and TEE   Verneita Griffes, MD  [facility] Pager [phone] 06/30/2015, 6:54 PM    LOS: 2 days

## 2015-06-30 NOTE — Progress Notes (Signed)
VASCULAR LAB PRELIMINARY  PRELIMINARY  PRELIMINARY  PRELIMINARY  Bilateral lower extremity venous duplex completed.    Preliminary report:  Bilateral:  No evidence of DVT, superficial thrombosis, or Baker's Cyst.  Note patient has roulex blood flow( thickening of the blood) in both common femoral vein and left popliteal vein. Exam difficult due to shadowing of the calcification from the artery system.  Janifer Adie, RVT, RDMS 06/30/2015, 5:17 PM

## 2015-06-30 NOTE — Care Management Note (Signed)
Case Management Note  Patient Details  Name: Mikelle Minear MRN: [id number] Date of Birth: 1929-07-13  Subjective/Objective:                    Action/Plan:  Benefits check for Eliquis , can give 30 day free card Expected Discharge Date:                  Expected Discharge Plan:  Home/Self Care  In-House Referral:     Discharge planning Services  CM Consult  Post Acute Care Choice:    Choice offered to:     DME Arranged:    DME Agency:     HH Arranged:    HH Agency:     Status of Service:  In process, will continue to follow  Medicare Important Message Given:    Date Medicare IM Given:    Medicare IM give by:    Date Additional Medicare IM Given:    Additional Medicare Important Message give by:     If discussed at [place] of Stay Meetings, dates discussed:    Additional Comments:  Marilu Favre, RN 06/30/2015, 3:49 PM

## 2015-07-01 ENCOUNTER — Inpatient Hospital Stay (HOSPITAL_COMMUNITY): Payer: Medicare Other

## 2015-07-01 ENCOUNTER — Encounter (HOSPITAL_COMMUNITY): Payer: Self-pay | Admitting: *Deleted

## 2015-07-01 ENCOUNTER — Encounter (HOSPITAL_COMMUNITY)
Admission: EM | Disposition: A | Discharge status: Home Health | Payer: Self-pay | Source: Home / Self Care | Attending: Family Medicine

## 2015-07-01 DIAGNOSIS — R7881 Bacteremia: Secondary | ICD-10-CM

## 2015-07-01 DIAGNOSIS — C9110 Chronic lymphocytic leukemia of B-cell type not having achieved remission: Secondary | ICD-10-CM

## 2015-07-01 HISTORY — PX: TEE WITHOUT CARDIOVERSION: SHX5443

## 2015-07-01 LAB — COMPREHENSIVE METABOLIC PANEL
ALBUMIN: 3.1 g/dL — AB (ref 3.5–5.0)
ALK PHOS: 67 U/L (ref 38–126)
ALT: 21 U/L (ref 17–63)
ANION GAP: 6 (ref 5–15)
AST: 57 U/L — ABNORMAL HIGH (ref 15–41)
BUN: 18 mg/dL (ref 6–20)
CHLORIDE: 105 mmol/L (ref 101–111)
CO2: 23 mmol/L (ref 22–32)
CREATININE: 0.91 mg/dL (ref 0.61–1.24)
Calcium: 8.8 mg/dL — ABNORMAL LOW (ref 8.9–10.3)
GFR calc non Af Amer: >60 mL/min (ref >60)
Glucose, Bld: 88 mg/dL (ref 65–99)
POTASSIUM: 4 mmol/L (ref 3.5–5.1)
SODIUM: 134 mmol/L — AB (ref 135–145)
Total Bilirubin: 2.1 mg/dL — ABNORMAL HIGH (ref 0.3–1.2)
Total Protein: 5.2 g/dL — ABNORMAL LOW (ref 6.5–8.1)

## 2015-07-01 LAB — CULTURE, BLOOD (ROUTINE X 2)

## 2015-07-01 LAB — CBC WITH DIFFERENTIAL/PLATELET
BASOS ABS: 0.2 10*3/uL — AB (ref 0.0–0.1)
BASOS PCT: 1 %
EOS ABS: 1 10*3/uL — AB (ref 0.0–0.7)
EOS PCT: 5 %
HCT: 21.9 % — ABNORMAL LOW (ref 39.0–52.0)
HEMOGLOBIN: 7.2 g/dL — AB (ref 13.0–17.0)
LYMPHS ABS: 8.1 10*3/uL — AB (ref 0.7–4.0)
Lymphocytes Relative: 41 %
MCH: 37.9 pg — AB (ref 26.0–34.0)
MCHC: 32.9 g/dL (ref 30.0–36.0)
MCV: 115.3 fL — ABNORMAL HIGH (ref 78.0–100.0)
MONO ABS: 3.7 10*3/uL — AB (ref 0.1–1.0)
Monocytes Relative: 19 %
Neutro Abs: 6.7 10*3/uL (ref 1.7–7.7)
Neutrophils Relative %: 34 %
Platelets: 338 10*3/uL (ref 150–400)
RBC: 1.9 MIL/uL — AB (ref 4.22–5.81)
RDW: 23.9 % — AB (ref 11.5–15.5)
WBC: 19.7 10*3/uL — AB (ref 4.0–10.5)

## 2015-07-01 LAB — VANCOMYCIN, TROUGH: VANCOMYCIN TR: 10 ug/mL (ref 10.0–20.0)

## 2015-07-01 LAB — HAPTOGLOBIN

## 2015-07-01 LAB — PATHOLOGIST SMEAR REVIEW

## 2015-07-01 SURGERY — ECHOCARDIOGRAM, TRANSESOPHAGEAL
Anesthesia: Moderate Sedation

## 2015-07-01 MED ORDER — VANCOMYCIN HCL IN DEXTROSE 750-5 MG/150ML-% IV SOLN
750.0000 mg | Freq: Two times a day (BID) | INTRAVENOUS | Status: DC
  Filled 2015-07-01 (×3): qty 150

## 2015-07-01 MED ORDER — FENTANYL CITRATE (PF) 100 MCG/2ML IJ SOLN
INTRAMUSCULAR | Status: AC
  Filled 2015-07-01: qty 2

## 2015-07-01 MED ORDER — SODIUM CHLORIDE 0.9 % IV SOLN
INTRAVENOUS | Status: DC
  Administered 2015-07-01: 500 mL via INTRAVENOUS

## 2015-07-01 MED ORDER — MIDAZOLAM HCL 5 MG/ML IJ SOLN
INTRAMUSCULAR | Status: AC
  Filled 2015-07-01: qty 2

## 2015-07-01 MED ORDER — VANCOMYCIN HCL IN DEXTROSE 750-5 MG/150ML-% IV SOLN
750.0000 mg | Freq: Two times a day (BID) | INTRAVENOUS | Status: DC
  Administered 2015-07-01 – 2015-07-03 (×4): 750 mg via INTRAVENOUS
  Filled 2015-07-01 (×5): qty 150

## 2015-07-01 MED ORDER — FENTANYL CITRATE (PF) 100 MCG/2ML IJ SOLN
INTRAMUSCULAR | Status: DC | PRN
  Administered 2015-07-01 (×2): 25 ug via INTRAVENOUS

## 2015-07-01 MED ORDER — MIDAZOLAM HCL 10 MG/2ML IJ SOLN
INTRAMUSCULAR | Status: DC | PRN
  Administered 2015-07-01 (×3): 1 mg via INTRAVENOUS

## 2015-07-01 NOTE — Progress Notes (Signed)
Physical Therapy Treatment Patient Details Name: [name] [name] MRN: [id number] DOB: [birth year] Today's Date: 07/01/2015    History of Present Illness HPI: [name] [name] is a 82 y.o. male with PMH of chronic diastolic CHF, CLL in remission, autoimmune hemolytic anemia previously on prednisone, and polymyositis on CellCept who presents to the ED with 1 week of progressive dyspnea on exertion. Patient lives alone and at his baseline walks for exercise daily, but since the development of exertional dyspnea approximately one week ago, he has been unable to tolerate this. Found to have acute PE, as of 3/20 has been anticoagulated for at least 24 hours    PT Comments    Patient progressing well with ambulation on RA. Patient able to attempt steps today. Patient stated he just felt tired and easily fatigued from not having as much mobility. Encouraged daily ambulation with staff.   Follow Up Recommendations  No PT follow up;Other (comment) (Consider HHRN for disease management)     Equipment Recommendations  None recommended by PT    Recommendations for Other Services       Precautions / Restrictions Precautions Precautions: None    Mobility  Bed Mobility                  Transfers Overall transfer level: Modified independent                  Ambulation/Gait Ambulation/Gait assistance: Supervision Ambulation Distance (Feet): 300 Feet Assistive device: None     Gait velocity interpretation: Below normal speed for age/gender General Gait Details: Supervision for safety. Increased sway noted for balance reactions. Occasionally reaching out for rail for support. Patient has a cane    Stairs Stairs: Yes Stairs assistance: Min assist Stair Management: One rail Left;One rail Right;Step to pattern;Alternating pattern Number of Stairs: 10 General stair comments: A for balance. Patient a little shakey initially. Less confident descending steps. Alternating pattern  ascending/step to pattern descending  Wheelchair Mobility    Modified Rankin (Stroke Patients Only)       Balance                                    Cognition Arousal/Alertness: Awake/alert Behavior During Therapy: WFL for tasks assessed/performed Overall Cognitive Status: Within Functional Limits for tasks assessed                      Exercises      General Comments        Pertinent Vitals/Pain Pain Assessment: No/denies pain    Home Living                      Prior Function            PT Goals (current goals can now be found in the care plan section) Progress towards PT goals: Progressing toward goals    Frequency  Min 3X/week    PT Plan Current plan remains appropriate    Co-evaluation             End of Session   Activity Tolerance: Patient tolerated treatment well Patient left: in chair;with call bell/phone within reach     Time: 1019-1030 PT Time Calculation (min) (ACUTE ONLY): 11 min  Charges:  $Gait Training: 8-22 mins                    G Codes:  Jacqualyn Posey 07/01/2015, 11:53 AM  07/01/2015 Heidy Mccubbin, Tonia Brooms PTA

## 2015-07-01 NOTE — Progress Notes (Signed)
  Echocardiogram Echocardiogram Transesophageal has been performed.  Darlina Sicilian M 07/01/2015, 4:51 PM

## 2015-07-01 NOTE — Care Management (Signed)
/  W MARY @ [employer] [facility] # [phone]   ELIQUIS 5 MG BID 30 DAY SUPPLY 60 TAB   COVER- YES  CO-PAY - PATIENT WILL PAY FULL AMOUNT  PATIENT HAS DEDUCTIBLE OF $400.00 FOR MEDICINE  AND NOT MET  TIER- 3 DRUG  PRIOR APPROVAL- NO  PHARMACY: [facility]   30 day free card given to patient .   Patient voiced understanding and is aware of $400 deductible , states he can get medicine .  Magdalen Spatz RN BSN [phone]

## 2015-07-01 NOTE — Consult Note (Signed)
[name] for [facility]ious [facility]       Reason for Consult: CoNS bacteremia    Referring Physician: Dr. Darrick Meigs  Principal Problem:   Acute respiratory failure with hypoxia [facility]) Active Problems:   Chronic lymphocytic leukemia ([place])   ANEMIA   Polymyositis ([place])   Chronic diastolic CHF (congestive heart failure) (HCC)   Coronary artery disease   SOB (shortness of breath)   Dyspnea   Hypoxia   . apixaban  10 mg Oral BID   Followed by  . [START ON 07/05/2015] apixaban  5 mg Oral BID  . calcium-vitamin D  1 tablet Oral BID  . cholecalciferol  1,000 Units Oral Daily  . citalopram  40 mg Oral Daily  . ferrous sulfate  325 mg Oral Q breakfast  . folic acid  4 mg Oral Daily  . furosemide  40 mg Oral Daily  . montelukast  10 mg Oral QHS  . multivitamin with minerals  1 tablet Oral Daily  . mycophenolate  500 mg Oral Daily  . omega-3 acid ethyl esters  1 g Oral Q1200  . pantoprazole  40 mg Oral Daily  . protein supplement  1 scoop Oral TID  . sodium chloride flush  3 mL Intravenous Q12H  . temazepam  30 mg Oral QHS  . vancomycin  750 mg Intravenous Q12H    Recommendations: Continue vancomycin  TEE today  Assessment: He has CoNS bacteremia in someone with CLL history.     Antibiotics: vancomycin  HPI: [name] [name] is a 82 y.o. male with CLL, CHF, autoimmune hemolytic anemia with progressively worsening sob.  Typically able to exercise daily.  Uses lasix at home.  Was hypoxic.  Came in to ED and noted WBC 18.5, was last 13,000.  Decreased Hgb.  Blood cultures sent and now positive for CoNS in 2/2.  He feels he has been febrile since he tells me his normal temperature is 97.   TEE today.   Review of Systems:  Constitutional: negative for chills Cardiovascular: negative for chest pain Gastrointestinal: negative for diarrhea All other systems reviewed and are negative   Past Medical History  Diagnosis Date  . CLL (chronic lymphoblastic leukemia) 2006    Rituxan  treatment 2006  . Atrial septal aneurysm 2009    Insignificant, no, January, 2009  . Infection of PEG site ([place])     cellulitis.Marland KitchenResolved  . Vertebral compression fracture (HCC)     multiple levels  . Herpes zoster     right chest  . Aortic insufficiency 2009    mild, Mild, echo, 2012  . Diastolic dysfunction     mild  . Chest pain     EF 55%, echo, October, 2009, possible inferior and posterior borderline hypokinesis, patient has never  had cardiac catheterization  . Edema     EF 55%, echo, October, 2009, possible borderline inferior and posterior hypokinesis... heart catheterization has never been done(July 10, 2009)  . Myositis     Caused pulmonary insufficiency with muscle weakness in the past / Dr.Willis-neurology, Imuran and prednisone  . Calculus of kidney   . Inguinal hernia without mention of obstruction or gangrene, unilateral or unspecified, (not specified as recurrent)     right   . Unspecified gastritis and gastroduodenitis without mention of hemorrhage   . Abdominal pain, epigastric   . Nonspecific elevation of levels of transaminase or lactic acid dehydrogenase (LDH)   . Feeding difficulties and mismanagement   . Edema of male genital  organs     Resolved, occurred during illness related to myositis  . Swelling of arm     left.Marland KitchenMarland KitchenResolved.... no DVT  . LFT elevation     Related to Imuran in the past... dose was adjusted  . Dyslipidemia     statins not used due to LFT abnormalities  . Shortness of breath     Breathing abnormalities related to muscle weakness from myositis  . RBBB (right bundle branch block)     Old  . Bradycardia     Sinus bradycardia, old  . Ejection fraction     EF 60%, echo, October, 2012  . Mitral regurgitation     Mild / moderate, echo, 2012  . Right ventricular dysfunction     Mild, echo, 2012  . Febrile illness     May, 2013, question sinusitis  . Polymyositis ([place]) 11/26/2012  . Hemolytic anemia ([place])   . Cellulitis   . Chronic  diastolic CHF (congestive heart failure) ([place]) 03/17/2015    Social History  Substance Use Topics  . Smoking status: Former Smoker -- 0.50 packs/day for 31 years    Types: Cigarettes    Quit date: 06/29/1968  . Smokeless tobacco: Former Systems developer    Types: Chew  . Alcohol Use: No     Comment: rarely    Family History  Problem Relation Age of Onset  . Breast cancer Sister   . Colon cancer Brother   . Prostate cancer Brother   . Stroke Mother   . Hypertension Mother   . Emphysema Father   . Diabetes Grandchild   . Heart attack Neg Hx   . Cancer Brother   . Cancer Sister   . Cancer Daughter     Allergies  Allergen Reactions  . Sulfonamide Derivatives Itching and Rash    Physical Exam: Constitutional: in no apparent distress and alert  Filed Vitals:   06/30/15 2359 07/01/15 0645  BP: 124/52 116/58  Pulse: 60 63  Temp: 98.6 F (37 C) 100.2 F (37.9 C)  Resp: 18 18   EYES: anicteric ENMT:no thrush Cardiovascular: Cor RRR and 2/6 Holosystolic murmur Respiratory: CTA B; normal respiratory effort GI: Bowel sounds are normal, liver is not enlarged, spleen is not enlarged Musculoskeletal: no pedal edema noted Skin: negatives: no rash Hematologic: no cervical lad  Lab Results  Component Value Date   WBC 19.7* 07/01/2015   HGB 7.2* 07/01/2015   HCT 21.9* 07/01/2015   MCV 115.3* 07/01/2015   PLT 338 07/01/2015    Lab Results  Component Value Date   CREATININE 0.91 07/01/2015   BUN 18 07/01/2015   NA 134* 07/01/2015   K 4.0 07/01/2015   CL 105 07/01/2015   CO2 23 07/01/2015    Lab Results  Component Value Date   ALT 21 07/01/2015   AST 57* 07/01/2015   ALKPHOS 67 07/01/2015     Microbiology: Recent Results (from the past 240 hour(s))  Culture, blood (routine x 2)     Status: None (Preliminary result)   Collection Time: 06/28/15  2:25 AM  Result Value Ref Range Status   Specimen Description BLOOD RIGHT HAND  Final   Special Requests BOTTLES DRAWN AEROBIC  AND ANAEROBIC 10CC  Final   Culture  Setup Time   Final    AEROBIC BOTTLE ONLY GRAM POSITIVE COCCI IN CLUSTERS CRITICAL RESULT CALLED TO, READ BACK BY AND VERIFIED WITH: J.CARSON,RN 0246 06/29/15 M.CAMPBELL    Culture STAPHYLOCOCCUS SPECIES (COAGULASE NEGATIVE)  Final  Report Status PENDING  Incomplete  Culture, blood (routine x 2)     Status: None (Preliminary result)   Collection Time: 06/28/15  2:30 AM  Result Value Ref Range Status   Specimen Description BLOOD LEFT ARM  Final   Special Requests BOTTLES DRAWN AEROBIC AND ANAEROBIC 10CC  Final   Culture  Setup Time   Final    GRAM POSITIVE COCCI IN CLUSTERS ANAEROBIC BOTTLE ONLY CRITICAL RESULT CALLED TO, READ BACK BY AND VERIFIED WITH: T COOK 1556 06/29/15 A BROWNING    Culture TOO YOUNG TO READ  Final   Report Status PENDING  Incomplete    Scharlene Gloss, [place] for Infectious Disease [facility] [email or web address] [phone] pager  [phone] cell 07/01/2015, 11:53 AM

## 2015-07-01 NOTE — Interval H&P Note (Signed)
History and Physical Interval Note:  07/01/2015 4:16 PM  [name] [name]  has presented today for surgery, with the diagnosis of r/o endocarditis  The various methods of treatment have been discussed with the patient and family. After consideration of risks, benefits and other options for treatment, the patient has consented to  Procedure(s): TRANSESOPHAGEAL ECHOCARDIOGRAM (TEE) (N/A) as a surgical intervention .  The patient's history has been reviewed, patient examined, no change in status, stable for surgery.  I have reviewed the patient's chart and labs.  Questions were answered to the patient's satisfaction.     Buffy Ehler S

## 2015-07-01 NOTE — Progress Notes (Signed)
[facility] PROGRESS NOTE  Brianna Esson ZYS:[id number] DOB: [birth year] DOA: 06/27/2015 PCP: Otilio Miu, MD  Assessment/Plan: 1. Acute PE Versus septic emboli-Risk factor CLL-D/w Dr. Alvy Bimler 3/19 who confirms that patient should be okay with ~ 6 mo Elliquis and the duration of this medication would need dependent on activity of his CLL. To be determined as an outpatient with Dr. Oliva Bustard. 3.20 ordered doppler lower extremities showed no evidence of DVT. 2. Gram + cocci [ on Blood cult coag neg staph x 1 bottle]-started vancomycin 3/19. Considering 2/2 bottles d/w ID Dr. Truett Mainland ESR/CRP and Echo. Echocardiogram showed no specific vegetation however given immunosuppression I discussed once again with infectious disease Dr. Megan Salon who thinks risk of right-sided endocarditis is high and recommends transition only to vancomycin. repeat blood cultures 3/21 am to document clearing of bacteremia and then we can determine when to place a PICC line. Patient to go for TEE today. 3. Acute hypoxic resp failure-2/2 to Pulm/septic embolism given h/o CLL. 4. Polymyositis on Cellcept-Stable.  5. CLL-LDH lower than baseline. Hemoglobin stable 7-8 range 6. Mild decompensated diastolic heart failure-resume Lasix low-dose 20 mg by mouth once 3/21-titrate to 40 mg qd in a.m. He is not dry but ankles are swollen. 7. Asymptomatic bradycardia RBBB-stable 8. Anemia-continue ferrous sulfate 325 every morning. Today hemoglobin is 7.2 , follow CBC in a.m. and transfuse if hemoglobin less than 7.0 9. Depression continue citalopram 40 daily, temazepam 30 daily at bedtime 10. Hypertension continue lisinopril 2.5 daily 11. Reflux continue pantoprazole 40 daily 12. Osteoporosis continue calcium plus vitamin D as well as cholecalciferol  Code Status: Full code Family Communication: Discussed with patient's daughter at bedside Disposition Plan: Pending outcome of TEE   Consultants:  Infectious  disease  Procedures:  None  Antibiotics:  Vancomycin  HPI/Subjective: 82 y.o. male with PMH of chronic diastolic CHF, CLL in remission, autoimmune hemolytic anemia previously on prednisone, and polymyositis on CellCept who presents to the ED with 1 week of progressive dyspnea on exertion. Patient lives alone and at his baseline walks for exercise daily, but since the development of exertional dyspnea approximately one week ago, he has been unable to tolerate this. Symptoms have continued to worsen to the point where he is been unable to attend to his household chores for the last 3 days due to dyspnea. He has history of chronic diastolic CHF, taking 80 mg Lasix daily, but denies development of any lower extremity edema during this illness. He denies chest pain, palpitations, or unilateral leg swelling or tenderness.  Patient waiting to go for TEE today.  Objective: Filed Vitals:   07/01/15 0645 07/01/15 1425  BP: 116/58 130/41  Pulse: 63   Temp: 100.2 F (37.9 C) 97.8 F (36.6 C)  Resp: 18 25    Intake/Output Summary (Last 24 hours) at 07/01/15 1519 Last data filed at 07/01/15 0740  Gross per 24 hour  Intake    550 ml  Output   1425 ml  Net   -875 ml   Filed Weights   06/29/15 0339 06/30/15 1853 07/01/15 0500  Weight: 64.5 kg (142 lb 3.2 oz) 66.951 kg (147 lb 9.6 oz) 64.9 kg (143 lb 1.3 oz)    Exam:   General:  Appears in no acute distress  Cardiovascular: S1-S2 normal, regular rhythm  Respiratory: Clear to auscultation bilaterally  Abdomen: Soft, nontender, no organomegaly  Musculoskeletal: No cyanosis/clubbing/edema of the lower extremities   Data Reviewed: Basic Metabolic Panel:  Recent Labs Lab 06/27/15 2105 06/29/15 0448  06/30/15 0426 07/01/15 0420  NA 139 142 138 134*  K 4.5 4.1 4.1 4.0  CL 102 105 108 105  CO2 25 24 23 23   GLUCOSE 108* 107* 96 88  BUN 24* 23* 24* 18  CREATININE 0.97 0.93 0.94 0.91  CALCIUM 9.5 8.9 8.9 8.8*   Liver Function  Tests:  Recent Labs Lab 06/29/15 0448 07/01/15 0420  AST 47* 57*  ALT 17 21  ALKPHOS 61 67  BILITOT 2.6* 2.1*  PROT 5.0* 5.2*  ALBUMIN 3.0* 3.1*   CBC:  Recent Labs Lab 06/27/15 2105 06/27/15 2152 06/28/15 0408 06/29/15 0448 06/30/15 1820 07/01/15 2633  WBC 35.4* DUPLICATE REQUEST  --  56.2* 16.5* 19.7*  NEUTROABS 8.3* PENDING  --   --  5.4 6.7  HGB 9.5* DUPLICATE REQUEST  --  7.5* 8.5* 7.2*  HCT 56.3* DUPLICATE REQUEST 89.3* 24.2* 26.0* 21.9*  MCV 734.2* DUPLICATE REQUEST  --  876.8* 114.5* 115.7*  PLT 262 DUPLICATE REQUEST  --  035 328 338   Cardiac Enzymes: No results for input(s): CKTOTAL, CKMB, CKMBINDEX, TROPONINI in the last 168 hours. BNP (last 3 results)  Recent Labs  10/10/14 1403 06/28/15 0247  BNP 279.6* 121.4*    ProBNP (last 3 results) No results for input(s): PROBNP in the last 8760 hours.  CBG: No results for input(s): GLUCAP in the last 168 hours.  Recent Results (from the past 240 hour(s))  Culture, blood (routine x 2)     Status: None   Collection Time: 06/28/15  2:25 AM  Result Value Ref Range Status   Specimen Description BLOOD RIGHT HAND  Final   Special Requests BOTTLES DRAWN AEROBIC AND ANAEROBIC 10CC  Final   Culture  Setup Time   Final    AEROBIC BOTTLE ONLY GRAM POSITIVE COCCI IN CLUSTERS CRITICAL RESULT CALLED TO, READ BACK BY AND VERIFIED WITH: J.CARSON,RN 0246 06/29/15 M.CAMPBELL    Culture STAPHYLOCOCCUS SPECIES (COAGULASE NEGATIVE)  Final   Report Status 07/01/2015 FINAL  Final   Organism ID, Bacteria STAPHYLOCOCCUS SPECIES (COAGULASE NEGATIVE)  Final      Susceptibility   Staphylococcus species (coagulase negative) - MIC*    CIPROFLOXACIN <=0.5 SENSITIVE Sensitive     ERYTHROMYCIN >=8 RESISTANT Resistant     GENTAMICIN <=0.5 SENSITIVE Sensitive     OXACILLIN <=0.25 SENSITIVE Sensitive     TETRACYCLINE <=1 SENSITIVE Sensitive     VANCOMYCIN 1 SENSITIVE Sensitive     TRIMETH/SULFA <=10 SENSITIVE Sensitive      CLINDAMYCIN <=0.25 RESISTANT Resistant     RIFAMPIN <=0.5 SENSITIVE Sensitive     Inducible Clindamycin POSITIVE Resistant     * STAPHYLOCOCCUS SPECIES (COAGULASE NEGATIVE)  Culture, blood (routine x 2)     Status: None   Collection Time: 06/28/15  2:30 AM  Result Value Ref Range Status   Specimen Description BLOOD LEFT ARM  Final   Special Requests BOTTLES DRAWN AEROBIC AND ANAEROBIC 10CC  Final   Culture  Setup Time   Final    GRAM POSITIVE COCCI IN CLUSTERS ANAEROBIC BOTTLE ONLY CRITICAL RESULT CALLED TO, READ BACK BY AND VERIFIED WITH: T COOK 1556 06/29/15 A BROWNING    Culture   Final    STAPHYLOCOCCUS SPECIES (COAGULASE NEGATIVE) SUSCEPTIBILITIES PERFORMED ON PREVIOUS CULTURE WITHIN THE LAST 5 DAYS.    Report Status 07/01/2015 FINAL  Final     Studies: Dg Chest 2 View  07/01/2015  CLINICAL DATA:  Pna,very weak for few days,sob today EXAM: CHEST - 2 VIEW COMPARISON:  CT 06/28/2015 and previous FINDINGS: Patchy infiltrate or atelectasis in basilar segments right lower lobe. Blunting of posterior costophrenic angles as before. Apical blebs noted bilaterally. Heart size upper limits normal. Atheromatous aorta. Changes of vertebral augmentation at 10 contiguous thoracolumbar levels. IMPRESSION: 1. Persistent right lower lobe airspace infiltrate. Electronically Signed   By: Lucrezia Europe M.D.   On: 07/01/2015 09:57    Scheduled Meds: . [MAR Hold] apixaban  10 mg Oral BID   Followed by  . [MAR Hold] apixaban  5 mg Oral BID  . [MAR Hold] calcium-vitamin D  1 tablet Oral BID  . [MAR Hold] cholecalciferol  1,000 Units Oral Daily  . [MAR Hold] citalopram  40 mg Oral Daily  . [MAR Hold] ferrous sulfate  325 mg Oral Q breakfast  . [MAR Hold] folic acid  4 mg Oral Daily  . [MAR Hold] furosemide  40 mg Oral Daily  . [MAR Hold] montelukast  10 mg Oral QHS  . [MAR Hold] multivitamin with minerals  1 tablet Oral Daily  . [MAR Hold] mycophenolate  500 mg Oral Daily  . [MAR Hold] omega-3 acid  ethyl esters  1 g Oral Q1200  . [MAR Hold] pantoprazole  40 mg Oral Daily  . [MAR Hold] protein supplement  1 scoop Oral TID  . [MAR Hold] sodium chloride flush  3 mL Intravenous Q12H  . [MAR Hold] temazepam  30 mg Oral QHS  . [MAR Hold] vancomycin  750 mg Intravenous Q12H   Continuous Infusions: . sodium chloride 500 mL (07/01/15 1435)    Principal Problem:   Acute respiratory failure with hypoxia (HCC) Active Problems:   Chronic lymphocytic leukemia (HCC)   ANEMIA   Polymyositis (HCC)   Chronic diastolic CHF (congestive heart failure) (HCC)   Coronary artery disease   SOB (shortness of breath)   Dyspnea   Hypoxia    Time spent: 25 min    [facility]  [facility] Pager [phone]*. If 7PM-7AM, please contact night-coverage at [email or web address], password [facility] 07/01/2015, 3:19 PM  LOS: 3 days

## 2015-07-01 NOTE — H&P (View-Only) (Signed)
Referring Physician: Dr. Nita Sells  [name] [name] is an 82 y.o. male.                       Chief Complaint: Bacteremia  HPI: 82 year old male with gram positive bacteremia has need for TEE to r/o vegetation.  Past Medical History  Diagnosis Date  . CLL (chronic lymphoblastic leukemia) 2006    Rituxan treatment 2006  . Atrial septal aneurysm 2009    Insignificant, no, January, 2009  . Infection of PEG site ([place])     cellulitis.Marland KitchenResolved  . Vertebral compression fracture (H[facility])     multiple levels  . Herpes zoster     right chest  . Aortic insufficiency 2009    mild, Mild, echo, 2012  . Diastolic dysfunction     mild  . Chest pain     EF 55%, echo, October, 2009, possible inferior and posterior borderline hypokinesis, patient has never  had cardiac catheterization  . Edema     EF 55%, echo, October, 2009, possible borderline inferior and posterior hypokinesis... heart catheterization has never been done(July 10, 2009)  . Myositis     Caused pulmonary insufficiency with muscle weakness in the past / Dr.Willis-neurology, Imuran and prednisone  . Calculus of kidney   . Inguinal hernia without mention of obstruction or gangrene, unilateral or unspecified, (not specified as recurrent)     right   . Unspecified gastritis and gastroduodenitis without mention of hemorrhage   . Abdominal pain, epigastric   . Nonspecific elevation of levels of transaminase or lactic acid dehydrogenase (LDH)   . Feeding difficulties and mismanagement   . Edema of male genital organs     Resolved, occurred during illness related to myositis  . Swelling of arm     left.Marland KitchenMarland KitchenResolved.... no DVT  . LFT elevation     Related to Imuran in the past... dose was adjusted  . Dyslipidemia     statins not used due to LFT abnormalities  . Shortness of breath     Breathing abnormalities related to muscle weakness from myositis  . RBBB (right bundle branch block)     Old  . Bradycardia     Sinus  bradycardia, old  . Ejection fraction     EF 60%, echo, October, 2012  . Mitral regurgitation     Mild / moderate, echo, 2012  . Right ventricular dysfunction     Mild, echo, 2012  . Febrile illness     May, 2013, question sinusitis  . Polymyositis ([place]) 11/26/2012  . Hemolytic anemia ([place])   . Cellulitis   . Chronic diastolic CHF (congestive heart failure) ([place]) 03/17/2015      Past Surgical History  Procedure Laterality Date  . Splenectomy    . Hernia repair      left side  . Peg placement  06/2007  . Peg tube removal  12/2007  . Kyphosis surgery      multilevel vertebral  . Odontoid fracture surgery      anterior screw fixation  . Nasal sinus surgery      x2  . Cataract extraction      bi lateral    Family History  Problem Relation Age of Onset  . Breast cancer Sister   . Colon cancer Brother   . Prostate cancer Brother   . Stroke Mother   . Hypertension Mother   . Emphysema Father   . Diabetes Grandchild   . Heart attack Neg  Hx   . Cancer Brother   . Cancer Sister   . Cancer Daughter    Social History:  reports that he quit smoking about 47 years ago. His smoking use included Cigarettes. He has a 15.5 pack-year smoking history. He has quit using smokeless tobacco. His smokeless tobacco use included Chew. He reports that he does not drink alcohol. His drug history is not on file.  Allergies:  Allergies  Allergen Reactions  . Sulfonamide Derivatives Itching and Rash    Medications Prior to Admission  Medication Sig Dispense Refill  . albuterol (PROVENTIL) (2.5 MG/3ML) 0.083% nebulizer solution Take 3 mLs (2.5 mg total) by nebulization every 6 (six) hours as needed for wheezing or shortness of breath. 75 mL 12  . calcium-vitamin D (CALCIUM 500+D) 500-200 MG-UNIT per tablet Take 1 tablet by mouth 2 (two) times daily.      . Cholecalciferol (VITAMIN D3) 1000 UNITS tablet Take 1,000 Units by mouth daily.      . citalopram (CELEXA) 40 MG tablet TAKE 1 TABLET (40  MG TOTAL) BY MOUTH DAILY. 90 tablet 0  . cyanocobalamin (,VITAMIN B-12,) 1000 MCG/ML injection Inject 1 mL (1,000 mcg total) into the muscle every 14 (fourteen) days. At the first of the month (Patient taking differently: Inject 1,000 mcg into the muscle every 14 (fourteen) days. Takes every other sunday) 10 mL 3  . ferrous sulfate 325 (65 FE) MG tablet Take 325 mg by mouth daily with breakfast.      . fish oil-omega-3 fatty acids 1000 MG capsule Take 1 g by mouth daily at 12 noon.     . folic acid (FOLVITE) 1 MG tablet Take 4 tablets (4 mg total) by mouth daily. 120 tablet 0  . furosemide (LASIX) 80 MG tablet Take 1 tablet (80 mg total) by mouth daily. 90 tablet 3  . HYDROcodone-acetaminophen (NORCO/VICODIN) 5-325 MG tablet Take 1 tablet by mouth 3 (three) times daily. 30 tablet 0  . montelukast (SINGULAIR) 10 MG tablet Take 1 tablet (10 mg total) by mouth at bedtime. 1 tablet 11  . Multiple Vitamin (MULTIVITAMIN) capsule Take 1 capsule by mouth daily.      . mycophenolate (CELLCEPT) 500 MG tablet Take 1 tablet (500 mg total) by mouth daily. 90 tablet 3  . nystatin cream (MYCOSTATIN) APPLY TOPICALLY TWICE DAILY 30 g 0  . pantoprazole (PROTONIX) 40 MG tablet TAKE 1 TABLET (40 MG TOTAL) BY MOUTH DAILY AT 12 NOON. 90 tablet 1  . potassium chloride SA (K-DUR,KLOR-CON) 20 MEQ tablet Take 1 tablet (20 mEq total) by mouth 2 (two) times daily. 180 tablet 3  . PROAIR HFA 108 (90 BASE) MCG/ACT inhaler Inhale 2 puffs into the lungs every 6 (six) hours as needed for shortness of breath.   11  . Resource Beneprotein PACK Take 3 each by mouth 3 (three) times daily. Take 3g three times a day    . temazepam (RESTORIL) 30 MG capsule Take 1 capsule (30 mg total) by mouth at bedtime. 30 capsule 5    Results for orders placed or performed during the hospital encounter of 06/27/15 (from the past 48 hour(s))  CBC     Status: Abnormal   Collection Time: 06/29/15  4:48 AM  Result Value Ref Range   WBC 16.6 (H) 4.0 -  10.5 K/uL   RBC 2.15 (L) 4.22 - 5.81 MIL/uL   Hemoglobin 7.5 (L) 13.0 - 17.0 g/dL    Comment: DELTA CHECK NOTED REPEATED TO VERIFY  HCT 24.2 (L) 39.0 - 52.0 %   MCV 112.6 (H) 78.0 - 100.0 fL   MCH 34.9 (H) 26.0 - 34.0 pg   MCHC 31.0 30.0 - 36.0 g/dL   RDW 20.9 (H) 11.5 - 15.5 %   Platelets 277 150 - 400 K/uL  Comprehensive metabolic panel     Status: Abnormal   Collection Time: 06/29/15  4:48 AM  Result Value Ref Range   Sodium 142 135 - 145 mmol/L   Potassium 4.1 3.5 - 5.1 mmol/L   Chloride 105 101 - 111 mmol/L   CO2 24 22 - 32 mmol/L   Glucose, Bld 107 (H) 65 - 99 mg/dL   BUN 23 (H) 6 - 20 mg/dL   Creatinine, Ser 0.93 0.61 - 1.24 mg/dL   Calcium 8.9 8.9 - 10.3 mg/dL   Total Protein 5.0 (L) 6.5 - 8.1 g/dL   Albumin 3.0 (L) 3.5 - 5.0 g/dL   AST 47 (H) 15 - 41 U/L   ALT 17 17 - 63 U/L   Alkaline Phosphatase 61 38 - 126 U/L   Total Bilirubin 2.6 (H) 0.3 - 1.2 mg/dL   GFR calc non Af Amer >60 >60 mL/min   GFR calc Af Amer >60 >60 mL/min    Comment: (NOTE) The eGFR has been calculated using the CKD EPI equation. This calculation has not been validated in all clinical situations. eGFR's persistently <60 mL/min signify possible Chronic Kidney Disease.    Anion gap 13 5 - 15  Protime-INR     Status: Abnormal   Collection Time: 06/29/15  4:48 AM  Result Value Ref Range   Prothrombin Time 17.7 (H) 11.6 - 15.2 seconds   INR 1.45 0.00 - 1.49  Occult blood card to lab, stool RN will collect     Status: None   Collection Time: 06/29/15 10:28 AM  Result Value Ref Range   Fecal Occult Bld NEGATIVE NEGATIVE  Sedimentation rate     Status: Abnormal   Collection Time: 06/29/15  5:07 PM  Result Value Ref Range   Sed Rate 19 (H) 0 - 16 mm/hr  Basic metabolic panel     Status: Abnormal   Collection Time: 06/30/15  4:26 AM  Result Value Ref Range   Sodium 138 135 - 145 mmol/L   Potassium 4.1 3.5 - 5.1 mmol/L   Chloride 108 101 - 111 mmol/L   CO2 23 22 - 32 mmol/L   Glucose, Bld  96 65 - 99 mg/dL   BUN 24 (H) 6 - 20 mg/dL   Creatinine, Ser 0.94 0.61 - 1.24 mg/dL   Calcium 8.9 8.9 - 10.3 mg/dL   GFR calc non Af Amer >60 >60 mL/min   GFR calc Af Amer >60 >60 mL/min    Comment: (NOTE) The eGFR has been calculated using the CKD EPI equation. This calculation has not been validated in all clinical situations. eGFR's persistently <60 mL/min signify possible Chronic Kidney Disease.    Anion gap 7 5 - 15  Procalcitonin     Status: None   Collection Time: 06/30/15  4:26 AM  Result Value Ref Range   Procalcitonin 0.13 ng/mL    Comment:        Interpretation: PCT (Procalcitonin) <= 0.5 ng/mL: Systemic infection (sepsis) is not likely. Local bacterial infection is possible. (NOTE)         ICU PCT Algorithm               Non ICU PCT Algorithm    ----------------------------     ------------------------------  PCT < 0.25 ng/mL                 PCT < 0.1 ng/mL     Stopping of antibiotics            Stopping of antibiotics       strongly encouraged.               strongly encouraged.    ----------------------------     ------------------------------       PCT level decrease by               PCT < 0.25 ng/mL       >= 80% from peak PCT       OR PCT 0.25 - 0.5 ng/mL          Stopping of antibiotics                                             encouraged.     Stopping of antibiotics           encouraged.    ----------------------------     ------------------------------       PCT level decrease by              PCT >= 0.25 ng/mL       < 80% from peak PCT        AND PCT >= 0.5 ng/mL            Continuin g antibiotics                                              encouraged.       Continuing antibiotics            encouraged.    ----------------------------     ------------------------------     PCT level increase compared          PCT > 0.5 ng/mL         with peak PCT AND          PCT >= 0.5 ng/mL             Escalation of antibiotics                                           strongly encouraged.      Escalation of antibiotics        strongly encouraged.   CBC with Differential/Platelet     Status: Abnormal   Collection Time: 06/30/15  6:20 PM  Result Value Ref Range   WBC 16.5 (H) 4.0 - 10.5 K/uL   RBC 2.27 (L) 4.22 - 5.81 MIL/uL   Hemoglobin 8.5 (L) 13.0 - 17.0 g/dL   HCT 26.0 (L) 39.0 - 52.0 %   MCV 114.5 (H) 78.0 - 100.0 fL   MCH 37.4 (H) 26.0 - 34.0 pg   MCHC 32.7 30.0 - 36.0 g/dL   RDW 23.1 (H) 11.5 - 15.5 %   Platelets 328 150 - 400 K/uL   Neutrophils Relative % 33 %   Lymphocytes Relative 44 %   Monocytes Relative 18 %   Eosinophils Relative  4 %   Basophils Relative 1 %   Neutro Abs 5.4 1.7 - 7.7 K/uL   Lymphs Abs 7.2 (H) 0.7 - 4.0 K/uL   Monocytes Absolute 3.0 (H) 0.1 - 1.0 K/uL   Eosinophils Absolute 0.7 0.0 - 0.7 K/uL   Basophils Absolute 0.2 (H) 0.0 - 0.1 K/uL   No results found.  Review Of Systems General: no fevers, sweats, weight change, poor appetite, or fatigue. + Chills. HEENT: no blurry vision, hearing changes or sore throat Pulm: no wheeze. + Dyspnea, cough.  CV: no chest pain or palpitations Abd: no nausea, vomiting, abdominal pain, or constipation. + Diarrhea.  GU: no dysuria, hematuria, increased urinary frequency, or urgency. + Darkened urine.  Ext: no leg edema Neuro: no focal weakness, numbness, or tingling, no vision change or hearing loss Skin: no rash, no wounds MSK: No muscle spasm, no deformity, no red, hot, or swollen joint Heme: No easy bruising or bleeding Travel history: No recent long distant travel   Blood pressure 113/40, pulse 64, temperature 98.4 F (36.9 C), temperature source Oral, resp. rate 20, height _0  (1.651 m), weight 66.951 kg (147 lb 9.6 oz), SpO2 96 %. General: No respiratory distress. Averagely built and nourished  HEENT: Eyes: PERRL, EOMI, no scleral icterus or conjunctival pallor. ENT: No discharge from the ears or nose, no pharyngeal ulcers, oral mucosa moist. Neck: No  JVD, no bruit, no appreciable mass Heme: No cervical adenopathy, no pallor Cardiac: S1/S2, RRR, grade III crescendo-decrescendo murmur at LSB, No gallops or rubs. Pulm: Good air movement bilaterally. No rales, wheezing, rhonchi or rubs. Abd: Soft, nondistended, nontender, no rebound pain or gaurding, BS present. Ext: No LE edema bilaterally. 2+DP/PT pulse bilaterally. Musculoskeletal: No gross deformity, no red, hot, swollen joints  Skin: No rashes or wounds on exposed surfaces  Neuro: Alert, oriented X3, cranial nerves II-XII grossly intact. No focal findings Psych: Patient is not overtly psychotic, appropriate mood and affect.  Assessment/Plan Gram positive bacteremia R/O endocarditis Chronic diastolic heart failure H/O macrocytic anemia Polymyositis  TEE in AM. NPO post mid night.   Birdie Riddle, MD  06/30/2015, 10:48 PM

## 2015-07-01 NOTE — Progress Notes (Signed)
Pharmacy Antibiotic Note  42 YOM continues on vancomycin for GPC bacteremia.  Patient's renal function is stable and his vancomycin trough is sub-therapeutic.     Plan: - Change vanc to 750mg  IV Q12H - Monitor renal fxn, blood cultures, vanc trough PRN - F/U TEE   Height: 5\' 5"  (165.1 cm) Weight: 143 lb 1.3 oz (64.9 kg) IBW/kg (Calculated) : 61.5  Temp (24hrs), Avg:99.1 F (37.3 C), Min:98.4 F (36.9 C), Max:100.2 F (37.9 C)   Recent Labs Lab 06/27/15 2105 06/27/15 2152 06/28/15 0248 06/28/15 0408 06/29/15 0448 06/30/15 0426 06/30/15 1820 07/01/15 0420 07/01/15 0000000  WBC [id number]* DUPLICATE REQUEST  --   --  16.6*  --  16.5* 19.7*  --   CREATININE 0.97  --   --   --  0.93 0.94  --  0.91  --   LATICACIDVEN  --   --  0.8 0.8  --   --   --   --   --   VANCOTROUGH  --   --   --   --   --   --   --   --  10    Estimated Creatinine Clearance: 53.5 mL/min (by C-G formula based on Cr of 0.91).    Allergies  Allergen Reactions  . Sulfonamide Derivatives Itching and Rash    Antimicrobials this admission: Vanc 3/20 >> CTX 3/20 >> 3/21  Dose adjustments this admission: 3/22 VT = 10 mcg/mL on 500mg  q12 >> 750mg  q12 (allow for accumulation)  Microbiology results: 3/19 Blood Cx - GPC (1 is CoNS, other is pending) 3/19 RVP -  3/21 BCx x2 -  3/22 BCx x2 -    Remigio Eisenmenger D. Mina Marble, PharmD, BCPS Pager:  [phone] 07/01/2015, 10:45 AM

## 2015-07-01 NOTE — Care Management Important Message (Signed)
Important Message  Patient Details  Name: [name] [name] MRN: [id number] Date of Birth: February 02, 1930   Medicare Important Message Given:  Yes    [name] Leary 07/01/2015, 12:34 PM

## 2015-07-02 ENCOUNTER — Encounter (HOSPITAL_COMMUNITY): Payer: Self-pay | Admitting: Cardiovascular Disease

## 2015-07-02 ENCOUNTER — Inpatient Hospital Stay: Payer: Medicare Other

## 2015-07-02 ENCOUNTER — Inpatient Hospital Stay: Payer: Medicare Other | Admitting: Oncology

## 2015-07-02 LAB — CBC
HCT: 21.9 % — ABNORMAL LOW (ref 39.0–52.0)
Hemoglobin: 7.1 g/dL — ABNORMAL LOW (ref 13.0–17.0)
MCH: 38.2 pg — AB (ref 26.0–34.0)
MCHC: 32.4 g/dL (ref 30.0–36.0)
MCV: 117.7 fL — AB (ref 78.0–100.0)
PLATELETS: 383 10*3/uL (ref 150–400)
RBC: 1.86 MIL/uL — ABNORMAL LOW (ref 4.22–5.81)
RDW: 25.5 % — AB (ref 11.5–15.5)
WBC: 18.8 10*3/uL — ABNORMAL HIGH (ref 4.0–10.5)

## 2015-07-02 LAB — BASIC METABOLIC PANEL
Anion gap: 9 (ref 5–15)
BUN: 18 mg/dL (ref 6–20)
CO2: 24 mmol/L (ref 22–32)
CREATININE: 0.99 mg/dL (ref 0.61–1.24)
Calcium: 8.9 mg/dL (ref 8.9–10.3)
Chloride: 106 mmol/L (ref 101–111)
GFR calc Af Amer: >60 mL/min (ref >60)
GLUCOSE: 90 mg/dL (ref 65–99)
Potassium: 3.8 mmol/L (ref 3.5–5.1)
SODIUM: 139 mmol/L (ref 135–145)

## 2015-07-02 LAB — PROCALCITONIN: Procalcitonin: 0.25 ng/mL

## 2015-07-02 MED ORDER — PREDNISONE 20 MG PO TABS
20.0000 mg | ORAL_TABLET | Freq: Three times a day (TID) | ORAL | Status: DC
  Administered 2015-07-02 – 2015-07-03 (×4): 20 mg via ORAL
  Filled 2015-07-02 (×4): qty 1

## 2015-07-02 NOTE — Progress Notes (Signed)
Physical Therapy Treatment Patient Details Name: [name] [name] MRN: [id number] DOB: 11/06/1929 Today's Date: 07-24-2015    History of Present Illness HPI: [name] [name] is a 82 y.o. male with PMH of chronic diastolic CHF, CLL in remission, autoimmune hemolytic anemia previously on prednisone, and polymyositis on CellCept who presents to the ED with 1 week of progressive dyspnea on exertion. Patient lives alone and at his baseline walks for exercise daily, but since the development of exertional dyspnea approximately one week ago, he has been unable to tolerate this. Found to have acute PE, as of 3/20 has been anticoagulated for at least 24 hours    PT Comments    Patient continues to make progress with mobility, gait, and activity tolerance.  Follow Up Recommendations  No PT follow up;Other (comment) (Consider HHRN for disease management)     Equipment Recommendations  None recommended by PT    Recommendations for Other Services       Precautions / Restrictions Precautions Precautions: None Restrictions Weight Bearing Restrictions: No    Mobility  Bed Mobility Overal bed mobility: Modified Independent             General bed mobility comments: Increased time  Transfers Overall transfer level: Modified independent Equipment used: None             General transfer comment: No assist required.  Good static balance.  Ambulation/Gait Ambulation/Gait assistance: Supervision Ambulation Distance (Feet): 400 Feet Assistive device: None Gait Pattern/deviations: Step-through pattern;Decreased stride length;Trunk flexed     General Gait Details: Supervision for safety.  Noted decreased balance with distractions, while talking and walking/turning head.   Stairs            Wheelchair Mobility    Modified Rankin (Stroke Patients Only)       Balance                                    Cognition Arousal/Alertness: Awake/alert Behavior During  Therapy: WFL for tasks assessed/performed Overall Cognitive Status: Within Functional Limits for tasks assessed                      Exercises      General Comments        Pertinent Vitals/Pain Pain Assessment: No/denies pain    Home Living                      Prior Function            PT Goals (current goals can now be found in the care plan section) Progress towards PT goals: Progressing toward goals    Frequency  Min 3X/week    PT Plan Current plan remains appropriate    Co-evaluation             End of Session Equipment Utilized During Treatment: Gait belt Activity Tolerance: Patient tolerated treatment well Patient left: in bed;with call bell/phone within reach     Time: 1052-1105 PT Time Calculation (min) (ACUTE ONLY): 13 min  Charges:  $Gait Training: 8-22 mins                    G Codes:      Despina Pole 07/24/15, 11:31 AM Carita Pian. Sanjuana Kava, [place] Pager [phone]

## 2015-07-02 NOTE — Clinical Documentation Improvement (Signed)
Hospitalist  Can the diagnosis of Bacteremia be further specified?   Sepsis - specify causative organism if known  Determine if there is Severe Sepsis (Sepsis with organ dysfunction - specify), Septic Shock if present  Identify etiology of Sepsis - Device, Implant, Graft, Infusion, Abortion  Specify organ dysfunction - Respiratory Failure, Encephalopathy, Acute Kidney Failure, Pneumonia, UTI, Other (specify), Unable to Clinically Determine}  Other  Clinically Undetermined  Document any associated diagnoses/conditions. Please update your documentation within the medical record to reflect your response to this query. Thank you.  Supporting Information:  Pt has Gram positive bacteremia, CLL, PE vs Septic Emboli, a/c CHF, Acute Respiratory Failure and possible Endocarditis as per notes. Vitals upon admission:BP 119/55 mmHg  Pulse 72  Temp(Src) 100.1 F (37.8 C) (Oral)  Resp 22  Wt 60.328 kg  SpO2 95%   CXR 07-01-15 IMPRESSION: 1. Persistent right lower lobe airspace infiltrate.  Labs: Component     Latest Ref Rng 06/27/2015 06/27/2015 06/28/2015 06/29/2015         9:05 PM  9:52 PM    WBC     4.0 - 10.5 K/uL [id number] (H) DUPLICATE REQUEST  99991111 (H)  RBC     4.22 - 5.81 MIL/uL [id number] (L) DUPLICATE REQUEST  0000000 (L)  Hemoglobin     13.0 - 17.0 g/dL 9.5 (L) DUPLICATE REQUEST  7.5 (L)  HCT     39.0 - [id number] % [id number] (L) DUPLICATE REQUEST [id number] (L) 24.2 (L)   Component     Latest Ref Rng 06/30/2015 07/01/2015 07/02/2015            WBC     4.0 - 10.5 K/uL 16.5 (H) 19.7 (H) 18.8 (H)  RBC     4.22 - 5.81 MIL/uL 2.27 (L) 1.90 (L) 1.86 (L)  Hemoglobin     13.0 - 17.0 g/dL 8.5 (L) 7.2 (L) 7.1 (L)  HCT     39.0 - 52.0 % 26.0 (L) 21.9 (L) 21.9 (L)   Please exercise your independent, professional judgment when responding. A specific answer is not anticipated or expected.  Thank You, Alessandra Grout, RN, BSN, CCDS,Clinical Documentation Specialist:  [phone]  [phone]=[facility]- Health Information Management

## 2015-07-02 NOTE — Progress Notes (Signed)
[facility] PROGRESS NOTE  Aleister Lady TSV:[id number] DOB: 07/23/1929 DOA: 06/27/2015 PCP: Otilio Miu, MD  Assessment/Plan: 1. Acute PE Versus septic emboli-Risk factor CLL-D/w Dr. Alvy Bimler 3/19 who confirms that patient should be okay with ~ 6 mo Elliquis and the duration of this medication would need dependent on activity of his CLL. To be determined as an outpatient with Dr. Oliva Bustard. 3.20 ordered doppler lower extremities showed no evidence of DVT. 2. Gram + cocci [ on Blood cult coag neg staph x 1 bottle]-started vancomycin 3/19. Considering 2/2 bottles d/w ID Dr. Truett Mainland ESR/CRP and Echo. Echocardiogram showed no specific vegetation however given immunosuppression I discussed once again with infectious disease Dr. Megan Salon who thinks risk of right-sided endocarditis is high and recommends transition only to vancomycin. repeat blood cultures 3/21 am to document clearing of bacteremia and then we can determine when to place a PICC line. TEE negative for infective colitis. 3. Acute hypoxic resp failure-2/2 to Pulm/septic embolism given h/o CLL. 4. Polymyositis on Cellcept-Stable.  5. CLL-LDH lower than baseline. Hemoglobin stable 7-8 range 6. Mild decompensated diastolic heart failure-resume Lasix low-dose 20 mg by mouth once 3/21-titrate to 40 mg qd in a.m. He is not dry but ankles are swollen. 7. Asymptomatic bradycardia RBBB-stable 8. Anemia-likely from hemolysis, I called and discussed with patient's hematologist Dr. Jeb Levering at [facility]. He recommended to start the patient on high-dose prednisone 20 mg by mouth 3 times a day, and then follow-up CBC in a.m. Once hemoglobin improves patient and discharged on 40 mg prednisone with tapering 2.5 mg every week. He will follow patient in his clinic. Will start patient on prednisone 20 g by mouth 3 times a day. 9. Depression continue citalopram 40 daily, temazepam 30 daily at bedtime 10. Hypertension continue  lisinopril 2.5 daily 11. Reflux continue pantoprazole 40 daily 12. Osteoporosis continue calcium plus vitamin D as well as cholecalciferol  Code Status: Full code Family Communication: Discussed with patient's daughter at bedside Disposition Plan: Pending outcome of TEE   Consultants:  Infectious disease  Procedures:  None  Antibiotics:  Vancomycin  HPI/Subjective: 82 y.o. male with PMH of chronic diastolic CHF, CLL in remission, autoimmune hemolytic anemia previously on prednisone, and polymyositis on CellCept who presents to the ED with 1 week of progressive dyspnea on exertion. Patient lives alone and at his baseline walks for exercise daily, but since the development of exertional dyspnea approximately one week ago, he has been unable to tolerate this. Symptoms have continued to worsen to the point where he is been unable to attend to his household chores for the last 3 days due to dyspnea. He has history of chronic diastolic CHF, taking 80 mg Lasix daily, but denies development of any lower extremity edema during this illness. He denies chest pain, palpitations, or unilateral leg swelling or tenderness.  Patient underwent TEE yesterday which was negative for infective endocarditis. This morning hemoglobin dropped to 7.1.  Objective: Filed Vitals:   07/02/15 1000 07/02/15 1416  BP: 114/51 118/44  Pulse: 62 63  Temp: 98.4 F (36.9 C) 97.9 F (36.6 C)  Resp: 20 20    Intake/Output Summary (Last 24 hours) at 07/02/15 1455 Last data filed at 07/02/15 1400  Gross per 24 hour  Intake    560 ml  Output    925 ml  Net   -365 ml   Filed Weights   06/30/15 1853 07/01/15 0500 07/02/15 0500  Weight: 66.951 kg (147 lb 9.6 oz) 64.9 kg (143  lb 1.3 oz) 64.2 kg (141 lb 8.6 oz)    Exam:   General:  Appears in no acute distress  Cardiovascular: S1-S2 normal, regular rhythm  Respiratory: Clear to auscultation bilaterally  Abdomen: Soft, nontender, no  organomegaly  Musculoskeletal: No cyanosis/clubbing/edema of the lower extremities   Data Reviewed: Basic Metabolic Panel:  Recent Labs Lab 06/27/15 2105 06/29/15 0448 06/30/15 0426 07/01/15 0420 07/02/15 0433  NA 139 142 138 134* 139  K 4.5 4.1 4.1 4.0 3.8  CL 102 105 108 105 106  CO2 25 24 23 23 24   GLUCOSE 108* 107* 96 88 90  BUN 24* 23* 24* 18 18  CREATININE 0.97 0.93 0.94 0.91 0.99  CALCIUM 9.5 8.9 8.9 8.8* 8.9   Liver Function Tests:  Recent Labs Lab 06/29/15 0448 07/01/15 0420  AST 47* 57*  ALT 17 21  ALKPHOS 61 67  BILITOT 2.6* 2.1*  PROT 5.0* 5.2*  ALBUMIN 3.0* 3.1*   CBC:  Recent Labs Lab 06/27/15 2105 06/27/15 2152 06/28/15 0408 06/29/15 0448 06/30/15 1820 07/01/15 0420 07/02/15 0272  WBC 53.6* DUPLICATE REQUEST  --  64.4* 16.5* 19.7* 18.8*  NEUTROABS 8.3* PENDING  --   --  5.4 6.7  --   HGB 9.5* DUPLICATE REQUEST  --  7.5* 8.5* 7.2* 7.1*  HCT 03.4* DUPLICATE REQUEST 74.2* 24.2* 26.0* 21.9* 21.9*  MCV 595.6* DUPLICATE REQUEST  --  387.5* 114.5* 115.3* 643.3*  PLT 295 DUPLICATE REQUEST  --  188 328 338 383   Cardiac Enzymes: No results for input(s): CKTOTAL, CKMB, CKMBINDEX, TROPONINI in the last 168 hours. BNP (last 3 results)  Recent Labs  10/10/14 1403 06/28/15 0247  BNP 279.6* 121.4*    ProBNP (last 3 results) No results for input(s): PROBNP in the last 8760 hours.  CBG: No results for input(s): GLUCAP in the last 168 hours.  Recent Results (from the past 240 hour(s))  Culture, blood (routine x 2)     Status: None   Collection Time: 06/28/15  2:25 AM  Result Value Ref Range Status   Specimen Description BLOOD RIGHT HAND  Final   Special Requests BOTTLES DRAWN AEROBIC AND ANAEROBIC 10CC  Final   Culture  Setup Time   Final    AEROBIC BOTTLE ONLY GRAM POSITIVE COCCI IN CLUSTERS CRITICAL RESULT CALLED TO, READ BACK BY AND VERIFIED WITH: J.CARSON,RN 0246 06/29/15 M.CAMPBELL    Culture STAPHYLOCOCCUS SPECIES (COAGULASE  NEGATIVE)  Final   Report Status 07/01/2015 FINAL  Final   Organism ID, Bacteria STAPHYLOCOCCUS SPECIES (COAGULASE NEGATIVE)  Final      Susceptibility   Staphylococcus species (coagulase negative) - MIC*    CIPROFLOXACIN <=0.5 SENSITIVE Sensitive     ERYTHROMYCIN >=8 RESISTANT Resistant     GENTAMICIN <=0.5 SENSITIVE Sensitive     OXACILLIN <=0.25 SENSITIVE Sensitive     TETRACYCLINE <=1 SENSITIVE Sensitive     VANCOMYCIN 1 SENSITIVE Sensitive     TRIMETH/SULFA <=10 SENSITIVE Sensitive     CLINDAMYCIN <=0.25 RESISTANT Resistant     RIFAMPIN <=0.5 SENSITIVE Sensitive     Inducible Clindamycin POSITIVE Resistant     * STAPHYLOCOCCUS SPECIES (COAGULASE NEGATIVE)  Culture, blood (routine x 2)     Status: None   Collection Time: 06/28/15  2:30 AM  Result Value Ref Range Status   Specimen Description BLOOD LEFT ARM  Final   Special Requests BOTTLES DRAWN AEROBIC AND ANAEROBIC 10CC  Final   Culture  Setup Time   Final    GRAM  POSITIVE COCCI IN CLUSTERS ANAEROBIC BOTTLE ONLY CRITICAL RESULT CALLED TO, READ BACK BY AND VERIFIED WITH: T COOK 1556 06/29/15 A BROWNING    Culture   Final    STAPHYLOCOCCUS SPECIES (COAGULASE NEGATIVE) SUSCEPTIBILITIES PERFORMED ON PREVIOUS CULTURE WITHIN THE LAST 5 DAYS.    Report Status 07/01/2015 FINAL  Final  Culture, blood (Routine X 2) w Reflex to ID Panel     Status: None (Preliminary result)   Collection Time: 06/30/15 11:41 PM  Result Value Ref Range Status   Specimen Description BLOOD RIGHT ARM  Final   Special Requests BOTTLES DRAWN AEROBIC AND ANAEROBIC 5CC  Final   Culture NO GROWTH 1 DAY  Final   Report Status PENDING  Incomplete  Culture, blood (Routine X 2) w Reflex to ID Panel     Status: None (Preliminary result)   Collection Time: 06/30/15 11:48 PM  Result Value Ref Range Status   Specimen Description BLOOD LEFT HAND  Final   Special Requests BOTTLES DRAWN AEROBIC AND ANAEROBIC 4CC  Final   Culture NO GROWTH 1 DAY  Final   Report  Status PENDING  Incomplete     Studies: Dg Chest 2 View  07/01/2015  CLINICAL DATA:  Pna,very weak for few days,sob today EXAM: CHEST - 2 VIEW COMPARISON:  CT 06/28/2015 and previous FINDINGS: Patchy infiltrate or atelectasis in basilar segments right lower lobe. Blunting of posterior costophrenic angles as before. Apical blebs noted bilaterally. Heart size upper limits normal. Atheromatous aorta. Changes of vertebral augmentation at 10 contiguous thoracolumbar levels. IMPRESSION: 1. Persistent right lower lobe airspace infiltrate. Electronically Signed   By: Lucrezia Europe M.D.   On: 07/01/2015 09:57    Scheduled Meds: . apixaban  10 mg Oral BID   Followed by  . [START ON 07/05/2015] apixaban  5 mg Oral BID  . calcium-vitamin D  1 tablet Oral BID  . cholecalciferol  1,000 Units Oral Daily  . citalopram  40 mg Oral Daily  . ferrous sulfate  325 mg Oral Q breakfast  . folic acid  4 mg Oral Daily  . furosemide  40 mg Oral Daily  . montelukast  10 mg Oral QHS  . multivitamin with minerals  1 tablet Oral Daily  . mycophenolate  500 mg Oral Daily  . omega-3 acid ethyl esters  1 g Oral Q1200  . pantoprazole  40 mg Oral Daily  . predniSONE  20 mg Oral TID WC  . protein supplement  1 scoop Oral TID  . sodium chloride flush  3 mL Intravenous Q12H  . temazepam  30 mg Oral QHS  . vancomycin  750 mg Intravenous Q12H   Continuous Infusions:    Principal Problem:   Acute respiratory failure with hypoxia (HCC) Active Problems:   Chronic lymphocytic leukemia (HCC)   ANEMIA   Polymyositis (HCC)   Chronic diastolic CHF (congestive heart failure) (HCC)   Coronary artery disease   SOB (shortness of breath)   Dyspnea   Hypoxia    Time spent: 25 min    [facility]  [facility] Pager [phone]*. If 7PM-7AM, please contact night-coverage at [email or web address], password [facility] 352 07/02/2015, 2:55 PM  LOS: 4 days

## 2015-07-03 ENCOUNTER — Other Ambulatory Visit: Payer: Self-pay | Admitting: *Deleted

## 2015-07-03 DIAGNOSIS — D479 Neoplasm of uncertain behavior of lymphoid, hematopoietic and related tissue, unspecified: Secondary | ICD-10-CM

## 2015-07-03 DIAGNOSIS — C9110 Chronic lymphocytic leukemia of B-cell type not having achieved remission: Secondary | ICD-10-CM

## 2015-07-03 DIAGNOSIS — R5383 Other fatigue: Secondary | ICD-10-CM

## 2015-07-03 DIAGNOSIS — D594 Other nonautoimmune hemolytic anemias: Secondary | ICD-10-CM

## 2015-07-03 DIAGNOSIS — R531 Weakness: Secondary | ICD-10-CM

## 2015-07-03 LAB — CBC
HEMATOCRIT: 26.3 % — AB (ref 39.0–52.0)
HEMOGLOBIN: 8.6 g/dL — AB (ref 13.0–17.0)
MCH: 38.6 pg — AB (ref 26.0–34.0)
MCHC: 32.7 g/dL (ref 30.0–36.0)
MCV: 117.9 fL — AB (ref 78.0–100.0)
PLATELETS: 417 10*3/uL — AB (ref 150–400)
RBC: 2.23 MIL/uL — AB (ref 4.22–5.81)
RDW: 26.5 % — ABNORMAL HIGH (ref 11.5–15.5)
WBC: 18.4 10*3/uL — ABNORMAL HIGH (ref 4.0–10.5)

## 2015-07-03 LAB — BASIC METABOLIC PANEL
Anion gap: 11 (ref 5–15)
BUN: 20 mg/dL (ref 6–20)
CHLORIDE: 103 mmol/L (ref 101–111)
CO2: 23 mmol/L (ref 22–32)
CREATININE: 1 mg/dL (ref 0.61–1.24)
Calcium: 9.5 mg/dL (ref 8.9–10.3)
Glucose, Bld: 142 mg/dL — ABNORMAL HIGH (ref 65–99)
POTASSIUM: 3.9 mmol/L (ref 3.5–5.1)
SODIUM: 137 mmol/L (ref 135–145)

## 2015-07-03 LAB — OCCULT BLOOD X 1 CARD TO LAB, STOOL: FECAL OCCULT BLD: NEGATIVE

## 2015-07-03 MED ORDER — VANCOMYCIN HCL IN DEXTROSE 750-5 MG/150ML-% IV SOLN: 750.0000 mg | Freq: Two times a day (BID) | INTRAVENOUS | Status: DC

## 2015-07-03 MED ORDER — HEPARIN SOD (PORK) LOCK FLUSH 100 UNIT/ML IV SOLN
250.0000 [IU] | INTRAVENOUS | Status: AC | PRN
Start: 2015-07-03 — End: 2015-07-03
  Administered 2015-07-03: 250 [IU]

## 2015-07-03 MED ORDER — SODIUM CHLORIDE 0.9% FLUSH: 10.0000 mL | Freq: Two times a day (BID) | INTRAVENOUS | Status: DC

## 2015-07-03 MED ORDER — APIXABAN 5 MG PO TABS: 5.0000 mg | ORAL_TABLET | Freq: Two times a day (BID) | ORAL | Status: DC

## 2015-07-03 MED ORDER — APIXABAN 5 MG PO TABS: 10.0000 mg | ORAL_TABLET | Freq: Two times a day (BID) | ORAL | Status: DC

## 2015-07-03 MED ORDER — SODIUM CHLORIDE 0.9% FLUSH: 10.0000 mL | INTRAVENOUS | Status: DC | PRN

## 2015-07-03 MED ORDER — PREDNISONE 50 MG PO TABS: 50.0000 mg | ORAL_TABLET | Freq: Every day | ORAL | Status: DC

## 2015-07-03 NOTE — Progress Notes (Signed)
Peripherally Inserted Central Catheter/Midline Placement  The IV Nurse has discussed with the patient and/or persons authorized to consent for the patient, the purpose of this procedure and the potential benefits and risks involved with this procedure.  The benefits include less needle sticks, lab draws from the catheter and patient may be discharged home with the catheter.  Risks include, but not limited to, infection, bleeding, blood clot (thrombus formation), and puncture of an artery; nerve damage and irregular heat beat.  Alternatives to this procedure were also discussed.  PICC/Midline Placement Documentation  PICC Single Lumen [id number] PICC Right Basilic 41 cm 0 cm (Active)  Indication for Insertion or Continuance of Line Home intravenous therapies (PICC only) 07/03/2015 12:10 PM  Exposed Catheter (cm) 0 cm 07/03/2015 12:10 PM  Site Assessment Clean;Dry;Intact 07/03/2015 12:10 PM  Line Status Flushed;Saline locked;Blood return noted 07/03/2015 12:10 PM  Dressing Type Transparent 07/03/2015 12:10 PM  Dressing Status Clean;Dry;Intact 07/03/2015 12:10 PM  Dressing Change Due 07/10/15 07/03/2015 12:10 PM       [facility] Payment 07/03/2015, 12:11 PM

## 2015-07-03 NOTE — Progress Notes (Signed)
[place] for Infectious Disease   Reason for visit: Follow up on CoNS bacteremia  Interval History: TEEnegative for vegetation; afebrile.   Physical Exam: Constitutional:  Filed Vitals:   07/02/15 2148 07/03/15 0457  BP: 121/53 102/47  Pulse: 53 54  Temp: 98.7 F (37.1 C) 98.1 F (36.7 C)  Resp: 19 18   patient appears in NAD Respiratory: Normal respiratory effort; CTA B Cardiovascular: RRR  Review of Systems: Gastrointestinal: negative for diarrhea  Lab Results  Component Value Date   WBC 18.4* 07/03/2015   HGB 8.6* 07/03/2015   HCT 26.3* 07/03/2015   MCV 117.9* 07/03/2015   PLT 417* 07/03/2015    Lab Results  Component Value Date   CREATININE 1.00 07/03/2015   BUN 20 07/03/2015   NA 137 07/03/2015   K 3.9 07/03/2015   CL 103 07/03/2015   CO2 23 07/03/2015    Lab Results  Component Value Date   ALT 21 07/01/2015   AST 57* 07/01/2015   ALKPHOS 67 07/01/2015     Microbiology: Recent Results (from the past 240 hour(s))  Culture, blood (routine x 2)     Status: None   Collection Time: 06/28/15  2:25 AM  Result Value Ref Range Status   Specimen Description BLOOD RIGHT HAND  Final   Special Requests BOTTLES DRAWN AEROBIC AND ANAEROBIC 10CC  Final   Culture  Setup Time   Final    AEROBIC BOTTLE ONLY GRAM POSITIVE COCCI IN CLUSTERS CRITICAL RESULT CALLED TO, READ BACK BY AND VERIFIED WITH: J.CARSON,RN 0246 06/29/15 M.CAMPBELL    Culture STAPHYLOCOCCUS SPECIES (COAGULASE NEGATIVE)  Final   Report Status 07/01/2015 FINAL  Final   Organism ID, Bacteria STAPHYLOCOCCUS SPECIES (COAGULASE NEGATIVE)  Final      Susceptibility   Staphylococcus species (coagulase negative) - MIC*    CIPROFLOXACIN <=0.5 SENSITIVE Sensitive     ERYTHROMYCIN >=8 RESISTANT Resistant     GENTAMICIN <=0.5 SENSITIVE Sensitive     OXACILLIN <=0.25 SENSITIVE Sensitive     TETRACYCLINE <=1 SENSITIVE Sensitive     VANCOMYCIN 1 SENSITIVE Sensitive     TRIMETH/SULFA <=10 SENSITIVE  Sensitive     CLINDAMYCIN <=0.25 RESISTANT Resistant     RIFAMPIN <=0.5 SENSITIVE Sensitive     Inducible Clindamycin POSITIVE Resistant     * STAPHYLOCOCCUS SPECIES (COAGULASE NEGATIVE)  Culture, blood (routine x 2)     Status: None   Collection Time: 06/28/15  2:30 AM  Result Value Ref Range Status   Specimen Description BLOOD LEFT ARM  Final   Special Requests BOTTLES DRAWN AEROBIC AND ANAEROBIC 10CC  Final   Culture  Setup Time   Final    GRAM POSITIVE COCCI IN CLUSTERS ANAEROBIC BOTTLE ONLY CRITICAL RESULT CALLED TO, READ BACK BY AND VERIFIED WITH: T COOK 1556 06/29/15 A BROWNING    Culture   Final    STAPHYLOCOCCUS SPECIES (COAGULASE NEGATIVE) SUSCEPTIBILITIES PERFORMED ON PREVIOUS CULTURE WITHIN THE LAST 5 DAYS.    Report Status 07/01/2015 FINAL  Final  Culture, blood (Routine X 2) w Reflex to ID Panel     Status: None (Preliminary result)   Collection Time: 06/30/15 11:41 PM  Result Value Ref Range Status   Specimen Description BLOOD RIGHT ARM  Final   Special Requests BOTTLES DRAWN AEROBIC AND ANAEROBIC 5CC  Final   Culture NO GROWTH 1 DAY  Final   Report Status PENDING  Incomplete  Culture, blood (Routine X 2) w Reflex to ID Panel  Status: None (Preliminary result)   Collection Time: 06/30/15 11:48 PM  Result Value Ref Range Status   Specimen Description BLOOD LEFT HAND  Final   Special Requests BOTTLES DRAWN AEROBIC AND ANAEROBIC 4CC  Final   Culture NO GROWTH 1 DAY  Final   Report Status PENDING  Incomplete    Impression/Plan:  1. CoNS bacteremia - would treat with 2 weeks of IV vancomycin through April 4th from negative blood culture 3/21 per home health protocol, twice weekly bmp, vancomycin trough. Ok to pull picc line at the end of treatment  2. Weakness, fatigue, sob - likely related to diastolic dysfunction.  No related to #1.    We are available for follow up if needed.

## 2015-07-03 NOTE — Care Management Note (Signed)
Case Management Note  Patient Details  Name: [name] [name] MRN: [id number] Date of Birth: [birth year]  Subjective/Objective:                    Action/Plan:   Expected Discharge Date:                  Expected Discharge Plan:  [place]  In-House Referral:     Discharge planning Services  CM Consult  Post Acute Care Choice:    Choice offered to:  Patient  DME Arranged:    DME Agency:     HH Arranged:  RN, IV Antibiotics HH Agency:  [facility]  Status of Service:  Completed, signed off  Medicare Important Message Given:  Yes Date Medicare IM Given:    Medicare IM give by:    Date Additional Medicare IM Given:    Additional Medicare Important Message give by:     If discussed at [place] of Stay Meetings, dates discussed:    Additional Comments:  Marilu Favre, RN 07/03/2015, 11:01 AM

## 2015-07-03 NOTE — Progress Notes (Signed)
[name] [name] to be D/C'd home with home health per MD order. Discussed with the patient and all questions fully answered.  VSS, Skin clean, dry and intact without evidence of skin break down, no evidence of skin tears noted.  PICC line in place for home antibiotic therapy.  Teaching completed by home health RN.  Line Heparin locked by IV team prior to discharge.  An After Visit Summary was printed and given to the patient. Patient received prescriptions.  D/c education completed with patient/family including follow up instructions, medication list, d/c activities limitations if indicated, with other d/c instructions as indicated by MD - patient able to verbalize understanding, all questions fully answered.   Patient instructed to return to ED, call 911, or call MD for any changes in condition.   Patient to be escorted via [place], and D/C home via private auto.

## 2015-07-03 NOTE — Progress Notes (Signed)
Physical Therapy Treatment Patient Details Name: [name] [name] MRN: [id number] DOB: 10-11-1929 Today's Date: 07/03/2015    History of Present Illness HPI: [name] [name] is a 82 y.o. male with PMH of chronic diastolic CHF, CLL in remission, autoimmune hemolytic anemia previously on prednisone, and polymyositis on CellCept who presents to the ED with 1 week of progressive dyspnea on exertion. Patient lives alone and at his baseline walks for exercise daily, but since the development of exertional dyspnea approximately one week ago, he has been unable to tolerate this. Found to have acute PE, as of 3/20 has been anticoagulated for at least 24 hours    PT Comments    Patient progressing well. Planning to DC later today. Patient safe to D/C from a mobility standpoint based on progression towards goals set on PT eval.    Follow Up Recommendations  No PT follow up;Other (comment)     Equipment Recommendations  None recommended by PT    Recommendations for Other Services       Precautions / Restrictions Precautions Precautions: None    Mobility  Bed Mobility Overal bed mobility: Modified Independent                Transfers Overall transfer level: Modified independent                  Ambulation/Gait Ambulation/Gait assistance: Modified independent (Device/Increase time) Ambulation Distance (Feet): 600 Feet         General Gait Details: use IV pole. Has cane at home   Stairs            Wheelchair Mobility    Modified Rankin (Stroke Patients Only)       Balance                                    Cognition Arousal/Alertness: Awake/alert Behavior During Therapy: WFL for tasks assessed/performed Overall Cognitive Status: Within Functional Limits for tasks assessed                      Exercises      General Comments        Pertinent Vitals/Pain Pain Assessment: No/denies pain    Home Living                     Prior Function            PT Goals (current goals can now be found in the care plan section) Progress towards PT goals: Progressing toward goals    Frequency  Min 3X/week    PT Plan Current plan remains appropriate    Co-evaluation             End of Session   Activity Tolerance: Patient tolerated treatment well Patient left: in chair;with call bell/phone within reach     Time: 1045-1054 PT Time Calculation (min) (ACUTE ONLY): 9 min  Charges:  $Gait Training: 8-22 mins                    G Codes:      Jacqualyn Posey 07/03/2015, 11:00 AM 07/03/2015 Braydyn Schultes, Tonia Brooms PTA

## 2015-07-03 NOTE — Progress Notes (Signed)
[facility]  Patient Status:   New pt for [facility] this admission.  [facility] is providing the following services: HHRN and [facility] for home IV ABX.  If patient discharges after hours, please call [phone].   [name] [name] 07/03/2015, 10:42 AM

## 2015-07-03 NOTE — Discharge Summary (Signed)
Physician Discharge Summary  [name] [name] GNF:[id number] DOB: Aug 21, 1929 DOA: 06/27/2015  PCP: Otilio Miu, MD  Admit date: 06/27/2015 Discharge date: 07/03/2015  Time spent: 25* minutes  Recommendations for Outpatient Follow-up:  1. Follow up Hematologist in one week   Discharge Diagnoses:  Principal Problem:   Acute respiratory failure with hypoxia (HCC) Active Problems:   Chronic lymphocytic leukemia (HCC)   ANEMIA   Polymyositis (HCC)   Chronic diastolic CHF (congestive heart failure) (HCC)   Coronary artery disease   SOB (shortness of breath)   Dyspnea   Hypoxia   Discharge Condition: Stable  Diet recommendation: Regular diet  Filed Weights   07/01/15 0500 07/02/15 0500 07/03/15 0457  Weight: 64.9 kg (143 lb 1.3 oz) 64.2 kg (141 lb 8.6 oz) 62.389 kg (137 lb 8.7 oz)    History of present illness:  82 y.o. male with PMH of chronic diastolic CHF, CLL in remission, autoimmune hemolytic anemia previously on prednisone, and polymyositis on CellCept who presents to the ED with 1 week of progressive dyspnea on exertion. Patient lives alone and at his baseline walks for exercise daily, but since the development of exertional dyspnea approximately one week ago, he has been unable to tolerate this. Symptoms have continued to worsen to the point where he is been unable to attend to his household chores for the last 3 days due to dyspnea. He has history of chronic diastolic CHF, taking 80 mg Lasix daily, but denies development of any lower extremity edema during this illness. He denies chest pain, palpitations, or unilateral leg swelling or tenderness. There's been no rhinorrhea, sore throat, or sick contacts. He denies fevers, but endorses chills for the past few days. He endorses a cough, occasionally productive of thick white sputum, but reports that this is been present for several months despite two courses of Levaquin and a chest CT that was reportedly unremarkable. His primary care  physician made a house call earlier in the day and reported the patient to be saturating 86% on room air. Patient does not typically use oxygen at home. He was advised to come into the emergency department for evaluation.   Hospital Course:   1. Acute PE Versus septic emboli-Risk factor CLL-D/w Dr. Alvy Bimler 3/19 who confirms that patient should be okay with ~ 6 mo Elliquis and the duration of this medication would need dependent on activity of his CLL. To be determined as an outpatient with Dr. Oliva Bustard. 3.20 ordered doppler lower extremities showed no evidence of DVT. 2. Gram + cocci [ on Blood cult coag neg staph x 1 bottle]-started vancomycin 3/19. Considering 2/2 bottles d/w ID Dr. Truett Mainland ESR/CRP and Echo. Echocardiogram showed no specific vegetation however given immunosuppression I discussed once again with infectious disease Dr. Megan Salon who thinks risk of right-sided endocarditis is high and recommends transition only to vancomycin. repeat blood cultures 3/21 am to document clearing of bacteremia and then we can determine when to place a PICC line. TEE negative for infective endocarditis. We will place a PICC line. Patient will be discharged on vancomycin for total 2 weeks. Stop date 07/14/2015 3. Acute hypoxic resp failure-2/2 to Pulm/septic embolism given h/o CLL. 4. Polymyositis on Cellcept-Stable.  5. CLL-LDH lower than baseline. Hemoglobin stable 7-8 range 6. Mild decompensated diastolic heart failure-resume Lasix low-dose 20 mg by mouth once 3/21-titrate to 40 mg qd in a.m. He is not dry but ankles are swollen. 7. Asymptomatic bradycardia RBBB-stable 8. Anemia-improved, today hemoglobin is 8.6, likely from hemolysis, I called and  discussed with patient's hematologist Dr. Jeb Levering at [facility]. He recommended to start the patient on high-dose prednisone 20 mg by mouth 3 times a day, and then follow-up CBC in a.m. Once hemoglobin improves patient and discharged on  40 to 50  mg prednisone with tapering dose every week. He will follow patient in his clinic. Patient will be discharged on prednisone 50 mg daily on tapering dose. He will follow up with hematology in one week. 9. Depression continue citalopram 40 daily, temazepam 30 daily at bedtime 10. Hypertension continue lisinopril 2.5 daily 11. Reflux continue pantoprazole 40 daily 12. Osteoporosis continue calcium plus vitamin D as well as cholecalciferol   Procedures:  None  Consultations:  Infectious disease  Discharge Exam: Filed Vitals:   07/02/15 2148 07/03/15 0457  BP: 121/53 102/47  Pulse: 53 54  Temp: 98.7 F (37.1 C) 98.1 F (36.7 C)  Resp: 19 18    General: Appears in no acute distress Cardiovascular: S1-S2 regular Respiratory: Clear to auscultation bilaterally  Discharge Instructions   Discharge Instructions    Diet - low sodium heart healthy    Complete by:  As directed      Increase activity slowly    Complete by:  As directed           Current Discharge Medication List    START taking these medications   Details  !! apixaban (ELIQUIS) 5 MG TABS tablet Take 2 tablets (10 mg total) by mouth 2 (two) times daily at 10 AM and 5 PM. Qty: 3 tablet, Refills: 0    !! apixaban (ELIQUIS) 5 MG TABS tablet Take 1 tablet (5 mg total) by mouth 2 (two) times daily. Qty: 60 tablet, Refills: 2    predniSONE (DELTASONE) 50 MG tablet Take 1 tablet (50 mg total) by mouth daily with breakfast. Prednisone 50 mg po daily x 7 days then Prednisone 45 mg po daily x 7 days then Prednisone 40 mg po daily x 7 day then Prednisone 35 mg po daily x 7 day then Prednisone 30 mg po daily x 7 day then Prednisone 25 mg daily Qty: 60 tablet, Refills: 2    Vancomycin (VANCOCIN) 750 MG/150ML SOLN Inject 150 mLs (750 mg total) into the vein every 12 (twelve) hours. Qty: 4000 mL, Refills: 0     !! - Potential duplicate medications found. Please discuss with provider.    CONTINUE these  medications which have NOT CHANGED   Details  albuterol (PROVENTIL) (2.5 MG/3ML) 0.083% nebulizer solution Take 3 mLs (2.5 mg total) by nebulization every 6 (six) hours as needed for wheezing or shortness of breath. Qty: 75 mL, Refills: 12   Associated Diagnoses: Acute bronchitis, unspecified organism; Chronic obstructive pulmonary disease with acute exacerbation (HCC)    calcium-vitamin D (CALCIUM 500+D) 500-200 MG-UNIT per tablet Take 1 tablet by mouth 2 (two) times daily.      Cholecalciferol (VITAMIN D3) 1000 UNITS tablet Take 1,000 Units by mouth daily.      citalopram (CELEXA) 40 MG tablet TAKE 1 TABLET (40 MG TOTAL) BY MOUTH DAILY. Qty: 90 tablet, Refills: 0    cyanocobalamin (,VITAMIN B-12,) 1000 MCG/ML injection Inject 1 mL (1,000 mcg total) into the muscle every 14 (fourteen) days. At the first of the month Qty: 10 mL, Refills: 3   Associated Diagnoses: CLL (chronic lymphocytic leukemia) ([place]); Hemolytic anemia associated with lymphoproliferative disorder ([place]); Chronic lymphocytic leukemia (HCC)    ferrous sulfate 325 (65 FE) MG tablet Take 325 mg  by mouth daily with breakfast.      fish oil-omega-3 fatty acids 1000 MG capsule Take 1 g by mouth daily at 12 noon.     folic acid (FOLVITE) 1 MG tablet Take 4 tablets (4 mg total) by mouth daily. Qty: 120 tablet, Refills: 0   Associated Diagnoses: CLL (chronic lymphocytic leukemia) ([place]); Hemolytic anemia associated with lymphoproliferative disorder (HCC)    furosemide (LASIX) 80 MG tablet Take 1 tablet (80 mg total) by mouth daily. Qty: 90 tablet, Refills: 3    HYDROcodone-acetaminophen (NORCO/VICODIN) 5-325 MG tablet Take 1 tablet by mouth 3 (three) times daily. Qty: 30 tablet, Refills: 0   Associated Diagnoses: DDD (degenerative disc disease), lumbosacral; Polymyositis (HCC)    montelukast (SINGULAIR) 10 MG tablet Take 1 tablet (10 mg total) by mouth at bedtime. Qty: 1 tablet, Refills: 11   Associated Diagnoses: Chronic  lymphocytic leukemia (HCC)    Multiple Vitamin (MULTIVITAMIN) capsule Take 1 capsule by mouth daily.      mycophenolate (CELLCEPT) 500 MG tablet Take 1 tablet (500 mg total) by mouth daily. Qty: 90 tablet, Refills: 3    nystatin cream (MYCOSTATIN) APPLY TOPICALLY TWICE DAILY Qty: 30 g, Refills: 0   Associated Diagnoses: Contact dermatitis    pantoprazole (PROTONIX) 40 MG tablet TAKE 1 TABLET (40 MG TOTAL) BY MOUTH DAILY AT 12 NOON. Qty: 90 tablet, Refills: 1    potassium chloride SA (K-DUR,KLOR-CON) 20 MEQ tablet Take 1 tablet (20 mEq total) by mouth 2 (two) times daily. Qty: 180 tablet, Refills: 3    PROAIR HFA 108 (90 BASE) MCG/ACT inhaler Inhale 2 puffs into the lungs every 6 (six) hours as needed for shortness of breath.  Refills: 11   Associated Diagnoses: Chronic lymphocytic leukemia (HCC)    Resource Beneprotein PACK Take 3 each by mouth 3 (three) times daily. Take 3g three times a day    temazepam (RESTORIL) 30 MG capsule Take 1 capsule (30 mg total) by mouth at bedtime. Qty: 30 capsule, Refills: 5   Associated Diagnoses: Insomnia       Allergies  Allergen Reactions  . Sulfonamide Derivatives Itching and Rash      The results of significant diagnostics from this hospitalization (including imaging, microbiology, ancillary and laboratory) are listed below for reference.    Significant Diagnostic Studies: Dg Chest 2 View  07/01/2015  CLINICAL DATA:  Pna,very weak for few days,sob today EXAM: CHEST - 2 VIEW COMPARISON:  CT 06/28/2015 and previous FINDINGS: Patchy infiltrate or atelectasis in basilar segments right lower lobe. Blunting of posterior costophrenic angles as before. Apical blebs noted bilaterally. Heart size upper limits normal. Atheromatous aorta. Changes of vertebral augmentation at 10 contiguous thoracolumbar levels. IMPRESSION: 1. Persistent right lower lobe airspace infiltrate. Electronically Signed   By: Lucrezia Europe M.D.   On: 07/01/2015 09:57   Dg Chest  2 View  06/27/2015  CLINICAL DATA:  Increasing weakness and dyspnea over the past few days. Productive cough of long-standing duration. EXAM: CHEST  2 VIEW COMPARISON:  01/13/2015 FINDINGS: Diffuse mild interstitial coarsening appears unchanged and is likely chronic. No confluent alveolar opacities. No effusions. Borderline cardiomegaly, unchanged. Pulmonary vasculature is normal. Hilar and mediastinal contours are unremarkable unchanged. Kyphosis and multiple thoracolumbar vertebroplasties again noted, unchanged. IMPRESSION: No acute cardiopulmonary findings. There is mild interstitial coarsening which appears to be chronic, unchanged. Electronically Signed   By: Andreas Newport M.D.   On: 06/27/2015 21:58   Ct Angio Chest Pe W/cm &/or Wo Cm  06/28/2015  CLINICAL DATA:  Dyspnea for 4 days, worse over the past 2 days. EXAM: CT ANGIOGRAPHY CHEST WITH CONTRAST TECHNIQUE: Multidetector CT imaging of the chest was performed using the standard protocol during bolus administration of intravenous contrast. Multiplanar CT image reconstructions and MIPs were obtained to evaluate the vascular anatomy. CONTRAST:  1110m OMNIPAQUE IOHEXOL 350 MG/ML SOLN COMPARISON:  02/26/2015 FINDINGS: Cardiovascular: There is good opacification of the pulmonary arteries. There are multiple small pulmonary emboli, predominantly involving fourth or fifth order branches of the upper lobes and lower lobes. No central emboli. No CT evidence of right heart strain. The thoracic aorta is normal in caliber and intact. Lungs: There is confluent consolidation of at least 2 right lower lobe basal segments, worsened from 02/26/2015. This is in an area of the lower lobe which is involved with cylindrical bronchiectasis, and there appears to be mucus plugging in the airways. Remainder of the lungs are well-aerated. Paraseptal emphysematous changes are again evident in the upper lobes. Central airways: Central airways are patent. There is mucous  plugging in bronchiectatic airways of the right lower lobe. Effusions: None Lymphadenopathy: None Esophagus: Unremarkable Upper abdomen: Small hiatal hernia Musculoskeletal: No significant skeletal lesion. There are multiple compressions and vertebroplasties. Moderate kyphosis. Review of the MIP images confirms the above findings. IMPRESSION: 1. Positive for pulmonary emboli. There are several small emboli in fourth or fifth order branches to the upper lobes and lower lobes. Critical Value/emergent results were called by telephone at the time of interpretation on 06/28/2015 at 5:14 am to Nurse JReginold Agent who verbally acknowledged these results. 2. Confluent consolidation of right lower lobe basal segments. There appear to be mucus plugs within bronchiectatic airways for the segments. 3. Small hiatal hernia. Electronically Signed   By: DAndreas NewportM.D.   On: 06/28/2015 05:17    Microbiology: Recent Results (from the past 240 hour(s))  Culture, blood (routine x 2)     Status: None   Collection Time: 06/28/15  2:25 AM  Result Value Ref Range Status   Specimen Description BLOOD RIGHT HAND  Final   Special Requests BOTTLES DRAWN AEROBIC AND ANAEROBIC 10CC  Final   Culture  Setup Time   Final    AEROBIC BOTTLE ONLY GRAM POSITIVE COCCI IN CLUSTERS CRITICAL RESULT CALLED TO, READ BACK BY AND VERIFIED WITH: J.CARSON,RN 0246 06/29/15 M.CAMPBELL    Culture STAPHYLOCOCCUS SPECIES (COAGULASE NEGATIVE)  Final   Report Status 07/01/2015 FINAL  Final   Organism ID, Bacteria STAPHYLOCOCCUS SPECIES (COAGULASE NEGATIVE)  Final      Susceptibility   Staphylococcus species (coagulase negative) - MIC*    CIPROFLOXACIN <=0.5 SENSITIVE Sensitive     ERYTHROMYCIN >=8 RESISTANT Resistant     GENTAMICIN <=0.5 SENSITIVE Sensitive     OXACILLIN <=0.25 SENSITIVE Sensitive     TETRACYCLINE <=1 SENSITIVE Sensitive     VANCOMYCIN 1 SENSITIVE Sensitive     TRIMETH/SULFA <=10 SENSITIVE Sensitive     CLINDAMYCIN <=0.25  RESISTANT Resistant     RIFAMPIN <=0.5 SENSITIVE Sensitive     Inducible Clindamycin POSITIVE Resistant     * STAPHYLOCOCCUS SPECIES (COAGULASE NEGATIVE)  Culture, blood (routine x 2)     Status: None   Collection Time: 06/28/15  2:30 AM  Result Value Ref Range Status   Specimen Description BLOOD LEFT ARM  Final   Special Requests BOTTLES DRAWN AEROBIC AND ANAEROBIC 10CC  Final   Culture  Setup Time   Final    GRAM POSITIVE COCCI IN CLUSTERS ANAEROBIC BOTTLE ONLY  CRITICAL RESULT CALLED TO, READ BACK BY AND VERIFIED WITH: T COOK [id number] 06/29/15 A BROWNING    Culture   Final    STAPHYLOCOCCUS SPECIES (COAGULASE NEGATIVE) SUSCEPTIBILITIES PERFORMED ON PREVIOUS CULTURE WITHIN THE LAST 5 DAYS.    Report Status 07/01/2015 FINAL  Final  Culture, blood (Routine X 2) w Reflex to ID Panel     Status: None (Preliminary result)   Collection Time: 06/30/15 11:41 PM  Result Value Ref Range Status   Specimen Description BLOOD RIGHT ARM  Final   Special Requests BOTTLES DRAWN AEROBIC AND ANAEROBIC 5CC  Final   Culture NO GROWTH 2 DAYS  Final   Report Status PENDING  Incomplete  Culture, blood (Routine X 2) w Reflex to ID Panel     Status: None (Preliminary result)   Collection Time: 06/30/15 11:48 PM  Result Value Ref Range Status   Specimen Description BLOOD LEFT HAND  Final   Special Requests BOTTLES DRAWN AEROBIC AND ANAEROBIC 4CC  Final   Culture NO GROWTH 2 DAYS  Final   Report Status PENDING  Incomplete     Labs: Basic Metabolic Panel:  Recent Labs Lab 06/29/15 0448 06/30/15 0426 07/01/15 0420 07/02/15 0433 07/03/15 0725  NA 142 138 134* 139 137  K 4.1 4.1 4.0 3.8 3.9  CL 105 108 105 106 103  CO2 24 23 23 24 23   GLUCOSE 107* 96 88 90 142*  BUN 23* 24* 18 18 20   CREATININE 0.93 0.94 0.91 0.99 1.00  CALCIUM 8.9 8.9 8.8* 8.9 9.5   Liver Function Tests:  Recent Labs Lab 06/29/15 0448 07/01/15 0420  AST 47* 57*  ALT 17 21  ALKPHOS 61 67  BILITOT 2.6* 2.1*  PROT 5.0* 5.2*   ALBUMIN 3.0* 3.1*   No results for input(s): LIPASE, AMYLASE in the last 168 hours. No results for input(s): AMMONIA in the last 168 hours. CBC:  Recent Labs Lab 06/27/15 2105 06/27/15 2152  06/29/15 0448 06/30/15 1820 07/01/15 0420 07/02/15 0433 07/03/15 9924  WBC 26.8* DUPLICATE REQUEST  --  34.1* 16.5* 19.7* 18.8* 18.4*  NEUTROABS 8.3* PENDING  --   --  5.4 6.7  --   --   HGB 9.5* DUPLICATE REQUEST  --  7.5* 8.5* 7.2* 7.1* 8.6*  HCT 96.2* DUPLICATE REQUEST  < > 22.9* 26.0* 21.9* 21.9* 26.3*  MCV 798.9* DUPLICATE REQUEST  --  211.9* 114.5* 115.3* 117.7* 417.4*  PLT 081 DUPLICATE REQUEST  --  448 328 338 383 417*  < > = values in this interval not displayed. Cardiac Enzymes: No results for input(s): CKTOTAL, CKMB, CKMBINDEX, TROPONINI in the last 168 hours. BNP: BNP (last 3 results)  Recent Labs  10/10/14 1403 06/28/15 0247  BNP 279.6* 121.4*    ProBNP (last 3 results) No results for input(s): PROBNP in the last 8760 hours.  CBG: No results for input(s): GLUCAP in the last 168 hours.     SignedOswald Hillock MD.  [facility] 07/03/2015, 12:38 PM

## 2015-07-06 LAB — CULTURE, BLOOD (ROUTINE X 2)
CULTURE: NO GROWTH
Culture: NO GROWTH

## 2015-07-07 ENCOUNTER — Inpatient Hospital Stay: Payer: Medicare Other | Attending: Oncology

## 2015-07-07 ENCOUNTER — Telehealth: Payer: Self-pay | Admitting: *Deleted

## 2015-07-07 ENCOUNTER — Inpatient Hospital Stay (HOSPITAL_BASED_OUTPATIENT_CLINIC_OR_DEPARTMENT_OTHER): Payer: Medicare Other | Admitting: Internal Medicine

## 2015-07-07 ENCOUNTER — Other Ambulatory Visit: Payer: Self-pay | Admitting: *Deleted

## 2015-07-07 VITALS — BP 131/63 | HR 85 | Temp 99.1°F | Resp 18 | Wt 136.2 lb

## 2015-07-07 DIAGNOSIS — Z87891 Personal history of nicotine dependence: Secondary | ICD-10-CM

## 2015-07-07 DIAGNOSIS — C9110 Chronic lymphocytic leukemia of B-cell type not having achieved remission: Secondary | ICD-10-CM | POA: Insufficient documentation

## 2015-07-07 DIAGNOSIS — Z8781 Personal history of (healed) traumatic fracture: Secondary | ICD-10-CM | POA: Diagnosis not present

## 2015-07-07 DIAGNOSIS — Z8669 Personal history of other diseases of the nervous system and sense organs: Secondary | ICD-10-CM | POA: Insufficient documentation

## 2015-07-07 DIAGNOSIS — R17 Unspecified jaundice: Secondary | ICD-10-CM | POA: Insufficient documentation

## 2015-07-07 DIAGNOSIS — I5032 Chronic diastolic (congestive) heart failure: Secondary | ICD-10-CM | POA: Diagnosis not present

## 2015-07-07 DIAGNOSIS — D649 Anemia, unspecified: Secondary | ICD-10-CM | POA: Insufficient documentation

## 2015-07-07 DIAGNOSIS — Z79899 Other long term (current) drug therapy: Secondary | ICD-10-CM | POA: Insufficient documentation

## 2015-07-07 DIAGNOSIS — Z7901 Long term (current) use of anticoagulants: Secondary | ICD-10-CM | POA: Insufficient documentation

## 2015-07-07 DIAGNOSIS — I351 Nonrheumatic aortic (valve) insufficiency: Secondary | ICD-10-CM

## 2015-07-07 DIAGNOSIS — Z87442 Personal history of urinary calculi: Secondary | ICD-10-CM | POA: Insufficient documentation

## 2015-07-07 DIAGNOSIS — E785 Hyperlipidemia, unspecified: Secondary | ICD-10-CM

## 2015-07-07 DIAGNOSIS — I4510 Unspecified right bundle-branch block: Secondary | ICD-10-CM | POA: Diagnosis not present

## 2015-07-07 DIAGNOSIS — D479 Neoplasm of uncertain behavior of lymphoid, hematopoietic and related tissue, unspecified: Secondary | ICD-10-CM

## 2015-07-07 DIAGNOSIS — I340 Nonrheumatic mitral (valve) insufficiency: Secondary | ICD-10-CM

## 2015-07-07 DIAGNOSIS — Z7982 Long term (current) use of aspirin: Secondary | ICD-10-CM

## 2015-07-07 DIAGNOSIS — D594 Other nonautoimmune hemolytic anemias: Secondary | ICD-10-CM

## 2015-07-07 LAB — COMPREHENSIVE METABOLIC PANEL
ALK PHOS: 78 U/L (ref 38–126)
ALT: 23 U/L (ref 17–63)
AST: 59 U/L — ABNORMAL HIGH (ref 15–41)
Albumin: 4 g/dL (ref 3.5–5.0)
Anion gap: 3 — ABNORMAL LOW (ref 5–15)
BILIRUBIN TOTAL: 3.8 mg/dL — AB (ref 0.3–1.2)
BUN: 19 mg/dL (ref 6–20)
CO2: 26 mmol/L (ref 22–32)
CREATININE: 0.84 mg/dL (ref 0.61–1.24)
Calcium: 8.5 mg/dL — ABNORMAL LOW (ref 8.9–10.3)
Chloride: 104 mmol/L (ref 101–111)
GFR calc Af Amer: >60 mL/min (ref >60)
GLUCOSE: 183 mg/dL — AB (ref 65–99)
POTASSIUM: 4 mmol/L (ref 3.5–5.1)
SODIUM: 133 mmol/L — AB (ref 135–145)
TOTAL PROTEIN: 6.3 g/dL — AB (ref 6.5–8.1)

## 2015-07-07 LAB — SAMPLE TO BLOOD BANK

## 2015-07-07 LAB — LACTATE DEHYDROGENASE: LDH: 911 U/L — AB (ref 98–192)

## 2015-07-07 LAB — CBC WITH DIFFERENTIAL/PLATELET
BASOS ABS: 0 10*3/uL (ref 0–0.1)
BASOS PCT: 0 %
Band Neutrophils: 2 %
EOS ABS: 0 10*3/uL (ref 0–0.7)
EOS PCT: 0 %
HCT: 29.2 % — ABNORMAL LOW (ref 40.0–52.0)
HEMOGLOBIN: 9.5 g/dL — AB (ref 13.0–18.0)
LYMPHS ABS: 2.5 10*3/uL (ref 1.0–3.6)
Lymphocytes Relative: 14 %
MCH: 41.5 pg — ABNORMAL HIGH (ref 26.0–34.0)
MCHC: 32.4 g/dL (ref 32.0–36.0)
MCV: 128.1 fL — ABNORMAL HIGH (ref 80.0–100.0)
METAMYELOCYTES PCT: 2 %
MONOS PCT: 3 %
Monocytes Absolute: 0.5 10*3/uL (ref 0.2–1.0)
Myelocytes: 1 %
Neutro Abs: 14.8 10*3/uL — ABNORMAL HIGH (ref 1.4–6.5)
Neutrophils Relative %: 78 %
Platelets: 387 10*3/uL (ref 150–440)
RBC: 2.28 MIL/uL — AB (ref 4.40–5.90)
RDW: 27.8 % — AB (ref 11.5–14.5)
WBC: 17.8 10*3/uL — AB (ref 3.8–10.6)
nRBC: 34 /100 WBC — ABNORMAL HIGH

## 2015-07-07 LAB — BILIRUBIN, FRACTIONATED(TOT/DIR/INDIR)
BILIRUBIN INDIRECT: 3.2 mg/dL — AB (ref 0.3–0.9)
Bilirubin, Direct: 0.4 mg/dL (ref 0.1–0.5)
Total Bilirubin: 3.6 mg/dL — ABNORMAL HIGH (ref 0.3–1.2)

## 2015-07-07 NOTE — Telephone Encounter (Signed)
Message from [facility] regarding vancomycin changes.  No hospital follow up appointment set up yet. Landis Gandy, RN

## 2015-07-07 NOTE — Progress Notes (Signed)
[place] OFFICE PROGRESS NOTE  Patient Care Team: Juline Patch, MD as PCP - General (Family Medicine) Kathrynn Ducking, MD (Neurology) Forest Gleason, MD as Consulting Physician (Unknown Physician Specialty) Carlena Bjornstad, MD as Consulting Physician (Cardiology) Seeplaputhur Robinette Haines, MD (General Surgery)   SUMMARY OF ONCOLOGIC HISTORY:  # Hemolytic Anemia sec to CLL s/p Splenectomy 2002; July 2016- severe anemia Hb 5.6 s/p Rituxan [July 2016];Rituxa x2 [sep & Nov 2016]; tapered on pred feb 2017.  March 2017- ~7 [facility]] Re-StartPredisone.   # CLL [wbc- 18; platelets-300s ]     INTERVAL HISTORY:  This is my first interaction with the patient as the patient had been following up with Dr. Oliva Bustard. I reviewed the patient's prior charts/pertinent labs/imaging in detail; findings are summarized above.   82 year old male patient with above history of CLL/history of hemolytic anemia is recently discharged from the hospital at [facility] when he was admitted for acute respiratory failure- pneumonia/septicemia; bilateral PE on Eliquis- and found to have worsening hemolytic anemia~ hemoglobin around 7. Patient is currently discharged home- on vancomycin IV/ until April 4. He is also on prednisone 50 mg for his hemolytic anemia.  Previous to restarting prednisone in the hospital in March 2017; patient was discontinued on his prednisone in February when his hemoglobin was steady at 13.5.  Patient complains of fatigue. Denies any difficulty swallowing. He does admit to shortness of breath on exertion. No fevers or chills. He is a PICC line for his IV antibiotics. He has noted yellowing of his eyes in the last few days.    REVIEW OF SYSTEMS:  A complete 10 point review of system is done which is negative except mentioned above/history of present illness.   PAST MEDICAL HISTORY :  Past Medical History  Diagnosis Date  . CLL (chronic lymphoblastic leukemia) 2006    Rituxan  treatment 2006  . Atrial septal aneurysm 2009    Insignificant, no, January, 2009  . Infection of PEG site ([place])     cellulitis.Marland KitchenResolved  . Vertebral compression fracture (HCC)     multiple levels  . Herpes zoster     right chest  . Aortic insufficiency 2009    mild, Mild, echo, 2012  . Diastolic dysfunction     mild  . Chest pain     EF 55%, echo, October, 2009, possible inferior and posterior borderline hypokinesis, patient has never  had cardiac catheterization  . Edema     EF 55%, echo, October, 2009, possible borderline inferior and posterior hypokinesis... heart catheterization has never been done(July 10, 2009)  . Myositis     Caused pulmonary insufficiency with muscle weakness in the past / Dr.Willis-neurology, Imuran and prednisone  . Calculus of kidney   . Inguinal hernia without mention of obstruction or gangrene, unilateral or unspecified, (not specified as recurrent)     right   . Unspecified gastritis and gastroduodenitis without mention of hemorrhage   . Abdominal pain, epigastric   . Nonspecific elevation of levels of transaminase or lactic acid dehydrogenase (LDH)   . Feeding difficulties and mismanagement   . Edema of male genital organs     Resolved, occurred during illness related to myositis  . Swelling of arm     left.Marland KitchenMarland KitchenResolved.... no DVT  . LFT elevation     Related to Imuran in the past... dose was adjusted  . Dyslipidemia     statins not used due to LFT abnormalities  . Shortness of breath  Breathing abnormalities related to muscle weakness from myositis  . RBBB (right bundle branch block)     Old  . Bradycardia     Sinus bradycardia, old  . Ejection fraction     EF 60%, echo, October, 2012  . Mitral regurgitation     Mild / moderate, echo, 2012  . Right ventricular dysfunction     Mild, echo, 2012  . Febrile illness     May, 2013, question sinusitis  . Polymyositis ([place]) 11/26/2012  . Hemolytic anemia ([place])   . Cellulitis   . Chronic  diastolic CHF (congestive heart failure) ([place]) 03/17/2015    PAST SURGICAL HISTORY :   Past Surgical History  Procedure Laterality Date  . Splenectomy    . Hernia repair      left side  . Peg placement  06/2007  . Peg tube removal  12/2007  . Kyphosis surgery      multilevel vertebral  . Odontoid fracture surgery      anterior screw fixation  . Nasal sinus surgery      x2  . Cataract extraction      bi lateral  . Tee without cardioversion N/A 07/01/2015    Procedure: TRANSESOPHAGEAL ECHOCARDIOGRAM (TEE);  Surgeon: Dixie Dials, MD;  Location: [facility];  Service: Cardiovascular;  Laterality: N/A;    FAMILY HISTORY :   Family History  Problem Relation Age of Onset  . Breast cancer Sister   . Colon cancer Brother   . Prostate cancer Brother   . Stroke Mother   . Hypertension Mother   . Emphysema Father   . Diabetes Grandchild   . Heart attack Neg Hx   . Cancer Brother   . Cancer Sister   . Cancer Daughter     SOCIAL HISTORY:   Social History  Substance Use Topics  . Smoking status: Former Smoker -- 0.50 packs/day for 31 years    Types: Cigarettes    Quit date: 06/29/1968  . Smokeless tobacco: Former Systems developer    Types: Chew  . Alcohol Use: No     Comment: rarely    ALLERGIES:  is allergic to sulfonamide derivatives.  MEDICATIONS:  Current Outpatient Prescriptions  Medication Sig Dispense Refill  . albuterol (PROVENTIL) (2.5 MG/3ML) 0.083% nebulizer solution Take 3 mLs (2.5 mg total) by nebulization every 6 (six) hours as needed for wheezing or shortness of breath. 75 mL 12  . apixaban (ELIQUIS) 5 MG TABS tablet Take 1 tablet (5 mg total) by mouth 2 (two) times daily. 60 tablet 2  . calcium-vitamin D (CALCIUM 500+D) 500-200 MG-UNIT per tablet Take 1 tablet by mouth 2 (two) times daily.      . Cholecalciferol (VITAMIN D3) 1000 UNITS tablet Take 1,000 Units by mouth daily.      . citalopram (CELEXA) 40 MG tablet TAKE 1 TABLET (40 MG TOTAL) BY MOUTH DAILY. 90 tablet 0   . cyanocobalamin (,VITAMIN B-12,) 1000 MCG/ML injection Inject 1 mL (1,000 mcg total) into the muscle every 14 (fourteen) days. At the first of the month (Patient taking differently: Inject 1,000 mcg into the muscle every 14 (fourteen) days. Takes every other sunday) 10 mL 3  . ferrous sulfate 325 (65 FE) MG tablet Take 325 mg by mouth daily with breakfast.      . fish oil-omega-3 fatty acids 1000 MG capsule Take 1 g by mouth daily at 12 noon.     . folic acid (FOLVITE) 1 MG tablet Take 4 tablets (4 mg total)  by mouth daily. 120 tablet 0  . furosemide (LASIX) 80 MG tablet Take 1 tablet (80 mg total) by mouth daily. 90 tablet 3  . HYDROcodone-acetaminophen (NORCO/VICODIN) 5-325 MG tablet Take 1 tablet by mouth 3 (three) times daily. 30 tablet 0  . montelukast (SINGULAIR) 10 MG tablet Take 1 tablet (10 mg total) by mouth at bedtime. 1 tablet 11  . Multiple Vitamin (MULTIVITAMIN) capsule Take 1 capsule by mouth daily.      . mycophenolate (CELLCEPT) 500 MG tablet Take 1 tablet (500 mg total) by mouth daily. 90 tablet 3  . nystatin cream (MYCOSTATIN) APPLY TOPICALLY TWICE DAILY 30 g 0  . pantoprazole (PROTONIX) 40 MG tablet TAKE 1 TABLET (40 MG TOTAL) BY MOUTH DAILY AT 12 NOON. 90 tablet 1  . potassium chloride SA (K-DUR,KLOR-CON) 20 MEQ tablet Take 1 tablet (20 mEq total) by mouth 2 (two) times daily. 180 tablet 3  . predniSONE (DELTASONE) 50 MG tablet Take 1 tablet (50 mg total) by mouth daily with breakfast. Prednisone 50 mg po daily x 7 days then Prednisone 45 mg po daily x 7 days then Prednisone 40 mg po daily x 7 day then Prednisone 35 mg po daily x 7 day then Prednisone 30 mg po daily x 7 day then Prednisone 25 mg daily 60 tablet 2  . PROAIR HFA 108 (90 BASE) MCG/ACT inhaler Inhale 2 puffs into the lungs every 6 (six) hours as needed for shortness of breath.   11  . Resource Beneprotein PACK Take 3 each by mouth 3 (three) times daily. Take 3g three times a day    . temazepam (RESTORIL) 30 MG  capsule Take 1 capsule (30 mg total) by mouth at bedtime. 30 capsule 5  . Vancomycin (VANCOCIN) 750 MG/150ML SOLN Inject 150 mLs (750 mg total) into the vein every 12 (twelve) hours. 4000 mL 0  . apixaban (ELIQUIS) 5 MG TABS tablet Take 2 tablets (10 mg total) by mouth 2 (two) times daily at 10 AM and 5 PM. 3 tablet 0   No current facility-administered medications for this visit.    PHYSICAL EXAMINATION: ECOG PERFORMANCE STATUS: 1 - Symptomatic but completely ambulatory  BP 131/63 mmHg  Pulse 85  Temp(Src) 99.1 F (37.3 C) (Tympanic)  Resp 18  Wt 136 lb 3.9 oz (61.8 kg)  Filed Weights   07/07/15 1405  Weight: 136 lb 3.9 oz (61.8 kg)    GENERAL: Frail-appearing Caucasian male patient accompanied by his daughter. He is up and about by himself. Alert, no distress and comfortable.   EYES: no pallor; positive for jaundice OROPHARYNX: no thrush or ulceration; good dentition  NECK: supple, no masses felt LYMPH:  no palpable lymphadenopathy in the cervical, axillary or inguinal regions LUNGS: Bilateral coarse breath sounds. No wheeze or crackles HEART/CVS: regular rate & rhythm and no murmurs; No lower extremity edema ABDOMEN:abdomen soft, non-tender and normal bowel sounds Musculoskeletal:no cyanosis of digits and no clubbing  PSYCH: alert & oriented x 3 with fluent speech NEURO: no focal motor/sensory deficits SKIN:  no rashes or significant lesions  LABORATORY DATA:  I have reviewed the data as listed    Component Value Date/Time   NA 133* 07/07/2015 1348   NA 140 06/17/2014 0933   NA 139 03/21/2014 1040   K 4.0 07/07/2015 1348   K 4.6 03/21/2014 1040   CL 104 07/07/2015 1348   CL 102 03/21/2014 1040   CO2 26 07/07/2015 1348   CO2 32 03/21/2014 1040  GLUCOSE 183* 07/07/2015 1348   GLUCOSE 75 06/17/2014 0933   GLUCOSE 93 03/21/2014 1040   BUN 19 07/07/2015 1348   BUN 18 06/17/2014 0933   BUN 20* 03/21/2014 1040   CREATININE 0.84 07/07/2015 1348   CREATININE 1.06  03/21/2014 1040   CALCIUM 8.5* 07/07/2015 1348   CALCIUM 9.2 03/21/2014 1040   PROT 6.3* 07/07/2015 1348   PROT 6.3 06/17/2014 0933   PROT 7.0 03/21/2014 1040   ALBUMIN 4.0 07/07/2015 1348   ALBUMIN 4.1 06/17/2014 0933   ALBUMIN 3.7 03/21/2014 1040   AST 59* 07/07/2015 1348   AST 36 03/21/2014 1040   ALT 23 07/07/2015 1348   ALT 22 03/21/2014 1040   ALKPHOS 78 07/07/2015 1348   ALKPHOS 92 03/21/2014 1040   BILITOT 3.8* 07/07/2015 1348   BILITOT 0.8 06/17/2014 0933   BILITOT 1.0 03/21/2014 1040   GFRNONAA >60 07/07/2015 1348   GFRNONAA >60 03/21/2014 1040   GFRNONAA >60 09/20/2013 0953   GFRAA >60 07/07/2015 1348   GFRAA >60 03/21/2014 1040   GFRAA >60 09/20/2013 0953    No results found for: SPEP, UPEP  Lab Results  Component Value Date   WBC 18.4* 07/03/2015   NEUTROABS 6.7 07/01/2015   HGB 8.6* 07/03/2015   HCT 26.3* 07/03/2015   MCV 117.9* 07/03/2015   PLT 417* 07/03/2015      Chemistry      Component Value Date/Time   NA 133* 07/07/2015 1348   NA 140 06/17/2014 0933   NA 139 03/21/2014 1040   K 4.0 07/07/2015 1348   K 4.6 03/21/2014 1040   CL 104 07/07/2015 1348   CL 102 03/21/2014 1040   CO2 26 07/07/2015 1348   CO2 32 03/21/2014 1040   BUN 19 07/07/2015 1348   BUN 18 06/17/2014 0933   BUN 20* 03/21/2014 1040   CREATININE 0.84 07/07/2015 1348   CREATININE 1.06 03/21/2014 1040      Component Value Date/Time   CALCIUM 8.5* 07/07/2015 1348   CALCIUM 9.2 03/21/2014 1040   ALKPHOS 78 07/07/2015 1348   ALKPHOS 92 03/21/2014 1040   AST 59* 07/07/2015 1348   AST 36 03/21/2014 1040   ALT 23 07/07/2015 1348   ALT 22 03/21/2014 1040   BILITOT 3.8* 07/07/2015 1348   BILITOT 0.8 06/17/2014 0933   BILITOT 1.0 03/21/2014 1040       ASSESSMENT & PLAN:   # Hemolytic anemia- secondary to CLL- currently on prednisone 50 mg a day. LDH elevated at 911 which is up from 597 from 9 days ago. Hemoglobin today is 9.7.; Continue prednisone at 50 mg once a day at  this time; we will recheck labs in one week/home [facility]    # Discussed with the patient that if this hemoglobin does not continue to go up/ Rituxan could be reinitiated.   # Jaundice/elevated bilirubin- likely indirect from hemolysis. Total bilirubin 3.8. We will check for indirect bilirubin.   # CLL- check CLL flow cytometry- At subsequent visits.  # Patient will follow-up with me in 3 weeks with CBC CMP and LDH and reticulocyte count/in [place].  # 25 minutes face-to-face with the patient discussing the above plan of care; more than 50% of time spent on prognosis/ natural history; counseling and coordination.    Cammie Sickle, MD 07/07/2015 2:55 PM

## 2015-07-07 NOTE — Progress Notes (Signed)
Patient states his urine is dark and very small amounts.  Just got out of the hospital Friday for bacterial blood infection of unknown source,  Currently receiving 750 mg IV Vancomycin daily.  Also patient found to have left a couple of small clots in left lung.  Patient placed on Eliquis.

## 2015-07-07 NOTE — Telephone Encounter (Signed)
-----   Message from Claire Shown sent at 07/07/2015 11:32 AM EDT ----- Regarding: Vancomycin Contact: [phone] Vancomycin trough = 8.6, WBC=19.3, Scr= 0.7. Will increase Vancomycin to 2 gm IV q24h to start tomorrow.

## 2015-07-13 ENCOUNTER — Other Ambulatory Visit: Payer: Self-pay

## 2015-07-14 ENCOUNTER — Telehealth: Payer: Self-pay | Admitting: *Deleted

## 2015-07-14 NOTE — Telephone Encounter (Signed)
Received lab results on patient with an alert RBC of 2.30. Called and spoke to Coretta at [facility] and she will notify Lyn and have him to page Dr. Linus Salmons, as the patient has not established at [facility] and does not currently have a hospital follow up. Myrtis Hopping

## 2015-07-15 ENCOUNTER — Telehealth: Payer: Self-pay | Admitting: Internal Medicine

## 2015-07-15 ENCOUNTER — Telehealth: Payer: Self-pay | Admitting: *Deleted

## 2015-07-15 NOTE — Telephone Encounter (Signed)
Please call him with lab results. He wants to know what is going on. [phone]

## 2015-07-15 NOTE — Telephone Encounter (Signed)
Patient aware that last dose of Vanc coming up in 2 days.  Wanting to know if Vanc needs to be continued.  Per Dr. Henreitta Leber hospital note the patient needed 2 weeks of Vanc after discharge from the hospital and the PICC was to be pulled at the completion of therapy.  RN shared this information with the patient.  He verbalized understanding.  RN spoke with [facility] Pharmacist, Jeani Hawking.  Shared Dr. Henreitta Leber hospital note and the Pull PICC order.  Jeani Hawking verbalized back the pull PICC order.

## 2015-07-15 NOTE — Telephone Encounter (Signed)
He did not really need follow up. Pretty straight forward and can call if anything changes. thanks

## 2015-07-16 ENCOUNTER — Telehealth: Payer: Self-pay | Admitting: Internal Medicine

## 2015-07-16 NOTE — Telephone Encounter (Signed)
Spoke to patient recommend tapering the steroids to 40 mg once a day; repeat CBC/LDH/LFTs/on 4/13-next Tuesday & weekly- further recommendations to follow.   Pt states he will have his last Vanc tomorrow/? Home health re: lab draw. Heather- please talk to him/ and i can sign the lab orders. Thx

## 2015-07-17 ENCOUNTER — Other Ambulatory Visit: Payer: Self-pay | Admitting: *Deleted

## 2015-07-17 ENCOUNTER — Telehealth: Payer: Self-pay | Admitting: *Deleted

## 2015-07-17 DIAGNOSIS — C9110 Chronic lymphocytic leukemia of B-cell type not having achieved remission: Secondary | ICD-10-CM

## 2015-07-17 NOTE — Telephone Encounter (Signed)
Called inquiring about appt after discussion with Dr B the other day. He reports that [facility] is no longer coming to see him as he has completed IV Abx and they pulled his PICC. After discussing with Dr B appt for lab only given for 4/11. Patient agrees to this appt

## 2015-07-20 NOTE — Telephone Encounter (Signed)
Called and spoke with patient. Adv. Home care has d/c his picc line and d/c home health services. He wants to come to cancer center for lab draws tomorrow.

## 2015-07-21 ENCOUNTER — Other Ambulatory Visit: Payer: Self-pay | Admitting: Internal Medicine

## 2015-07-21 ENCOUNTER — Inpatient Hospital Stay: Payer: Medicare Other | Attending: Oncology

## 2015-07-21 ENCOUNTER — Telehealth: Payer: Self-pay | Admitting: Internal Medicine

## 2015-07-21 DIAGNOSIS — R609 Edema, unspecified: Secondary | ICD-10-CM | POA: Diagnosis not present

## 2015-07-21 DIAGNOSIS — H5703 Miosis: Secondary | ICD-10-CM | POA: Diagnosis not present

## 2015-07-21 DIAGNOSIS — I340 Nonrheumatic mitral (valve) insufficiency: Secondary | ICD-10-CM | POA: Insufficient documentation

## 2015-07-21 DIAGNOSIS — R001 Bradycardia, unspecified: Secondary | ICD-10-CM | POA: Insufficient documentation

## 2015-07-21 DIAGNOSIS — I429 Cardiomyopathy, unspecified: Secondary | ICD-10-CM | POA: Diagnosis not present

## 2015-07-21 DIAGNOSIS — M3320 Polymyositis, organ involvement unspecified: Secondary | ICD-10-CM | POA: Diagnosis present

## 2015-07-21 DIAGNOSIS — D591 Autoimmune hemolytic anemia, unspecified: Secondary | ICD-10-CM | POA: Insufficient documentation

## 2015-07-21 DIAGNOSIS — R740 Nonspecific elevation of levels of transaminase and lactic acid dehydrogenase [LDH]: Secondary | ICD-10-CM | POA: Diagnosis not present

## 2015-07-21 DIAGNOSIS — Z87442 Personal history of urinary calculi: Secondary | ICD-10-CM | POA: Diagnosis not present

## 2015-07-21 DIAGNOSIS — I351 Nonrheumatic aortic (valve) insufficiency: Secondary | ICD-10-CM | POA: Diagnosis not present

## 2015-07-21 DIAGNOSIS — E7800 Pure hypercholesterolemia, unspecified: Secondary | ICD-10-CM | POA: Diagnosis not present

## 2015-07-21 DIAGNOSIS — C9110 Chronic lymphocytic leukemia of B-cell type not having achieved remission: Secondary | ICD-10-CM | POA: Diagnosis not present

## 2015-07-21 DIAGNOSIS — Z8673 Personal history of transient ischemic attack (TIA), and cerebral infarction without residual deficits: Secondary | ICD-10-CM | POA: Diagnosis not present

## 2015-07-21 DIAGNOSIS — R5383 Other fatigue: Secondary | ICD-10-CM | POA: Diagnosis not present

## 2015-07-21 DIAGNOSIS — D479 Neoplasm of uncertain behavior of lymphoid, hematopoietic and related tissue, unspecified: Secondary | ICD-10-CM

## 2015-07-21 DIAGNOSIS — Z8781 Personal history of (healed) traumatic fracture: Secondary | ICD-10-CM | POA: Diagnosis not present

## 2015-07-21 DIAGNOSIS — D594 Other nonautoimmune hemolytic anemias: Secondary | ICD-10-CM

## 2015-07-21 DIAGNOSIS — K4090 Unilateral inguinal hernia, without obstruction or gangrene, not specified as recurrent: Secondary | ICD-10-CM | POA: Insufficient documentation

## 2015-07-21 DIAGNOSIS — R1013 Epigastric pain: Secondary | ICD-10-CM | POA: Diagnosis not present

## 2015-07-21 DIAGNOSIS — D589 Hereditary hemolytic anemia, unspecified: Secondary | ICD-10-CM | POA: Insufficient documentation

## 2015-07-21 DIAGNOSIS — R0602 Shortness of breath: Secondary | ICD-10-CM | POA: Insufficient documentation

## 2015-07-21 DIAGNOSIS — I5032 Chronic diastolic (congestive) heart failure: Secondary | ICD-10-CM | POA: Diagnosis present

## 2015-07-21 DIAGNOSIS — Z9081 Acquired absence of spleen: Secondary | ICD-10-CM | POA: Insufficient documentation

## 2015-07-21 DIAGNOSIS — I4510 Unspecified right bundle-branch block: Secondary | ICD-10-CM | POA: Insufficient documentation

## 2015-07-21 LAB — CBC WITH DIFFERENTIAL/PLATELET
Basophils Absolute: 0.2 10*3/uL — ABNORMAL HIGH (ref 0–0.1)
Basophils Relative: 1 %
Eosinophils Absolute: 0 10*3/uL (ref 0–0.7)
HCT: 30 % — ABNORMAL LOW (ref 40.0–52.0)
Hemoglobin: 10.1 g/dL — ABNORMAL LOW (ref 13.0–18.0)
LYMPHS ABS: 6.1 10*3/uL — AB (ref 1.0–3.6)
MCH: 42.3 pg — AB (ref 26.0–34.0)
MCHC: 33.5 g/dL (ref 32.0–36.0)
MCV: 126.3 fL — AB (ref 80.0–100.0)
MONO ABS: 0.6 10*3/uL (ref 0.2–1.0)
Neutro Abs: 16.1 10*3/uL — ABNORMAL HIGH (ref 1.4–6.5)
Neutrophils Relative %: 70 %
PLATELETS: 318 10*3/uL (ref 150–440)
RBC: 2.38 MIL/uL — AB (ref 4.40–5.90)
RDW: 14.9 % — AB (ref 11.5–14.5)
WBC: 23.1 10*3/uL — ABNORMAL HIGH (ref 3.8–10.6)

## 2015-07-21 LAB — HEPATIC FUNCTION PANEL
ALT: 21 U/L (ref 17–63)
AST: 58 U/L — AB (ref 15–41)
Albumin: 4.4 g/dL (ref 3.5–5.0)
Alkaline Phosphatase: 66 U/L (ref 38–126)
BILIRUBIN DIRECT: 0.4 mg/dL (ref 0.1–0.5)
BILIRUBIN TOTAL: 5.2 mg/dL — AB (ref 0.3–1.2)
Indirect Bilirubin: 4.8 mg/dL — ABNORMAL HIGH (ref 0.3–0.9)
Total Protein: 6.6 g/dL (ref 6.5–8.1)

## 2015-07-21 LAB — LACTATE DEHYDROGENASE: LDH: 921 U/L — ABNORMAL HIGH (ref 98–192)

## 2015-07-21 NOTE — Telephone Encounter (Signed)
Spoke to the patient regarding rituxan/ cytoxan/ dex.

## 2015-07-21 NOTE — Telephone Encounter (Signed)
Spoke to pt re: elevated bili/LDH- likely from on going hemolysis; hb stable to 10.3. Continue prednisone at 40mg /d. Will plan start ritux-cytoxan-dex in appx 1 week/ April 18th. Pt agreeable.

## 2015-07-22 ENCOUNTER — Telehealth: Payer: Self-pay | Admitting: *Deleted

## 2015-07-22 ENCOUNTER — Other Ambulatory Visit: Payer: Self-pay | Admitting: Internal Medicine

## 2015-07-22 NOTE — Telephone Encounter (Signed)
RN spoke with patient per v/o MD. Md will change tx plan from Rituxan/Cytoxan to [facility]. Pt instructed to keep his apt on 4/18 with md. MD will explain tx plan at that time. Pt informed that Hilda Blades is given very slowly like Rituxan, which he has had in the past. The first treatment will given over several days. I explained that patient generally comes on Day 1, day 2, day 8 and day 15 for this NEW start gayzva tx. We will plan to start him on this new tx next week on Thursday/Friday (day 1/day2). I explained that subsequent tx would be given weekly x 2 (4/27 and May 4) days following the tx next week.  These tx will need to be done in [place] per pharmacy. He gave verbal understanding and thanked me for updating him on th eplan.

## 2015-07-24 ENCOUNTER — Telehealth: Payer: Self-pay | Admitting: *Deleted

## 2015-07-24 NOTE — Telephone Encounter (Signed)
Continue Eliquis per Dr B.  Pt informed

## 2015-07-24 NOTE — Telephone Encounter (Signed)
Asking if he needs to stop Eliquis before he starts his new chemo next week

## 2015-07-28 ENCOUNTER — Inpatient Hospital Stay (HOSPITAL_BASED_OUTPATIENT_CLINIC_OR_DEPARTMENT_OTHER): Payer: Medicare Other | Admitting: Internal Medicine

## 2015-07-28 ENCOUNTER — Encounter: Payer: Self-pay | Admitting: Internal Medicine

## 2015-07-28 ENCOUNTER — Telehealth: Payer: Self-pay | Admitting: Internal Medicine

## 2015-07-28 ENCOUNTER — Ambulatory Visit: Payer: Medicare Other

## 2015-07-28 ENCOUNTER — Inpatient Hospital Stay: Payer: Medicare Other

## 2015-07-28 ENCOUNTER — Ambulatory Visit: Payer: Medicare Other | Admitting: Internal Medicine

## 2015-07-28 ENCOUNTER — Other Ambulatory Visit: Payer: Self-pay | Admitting: Internal Medicine

## 2015-07-28 VITALS — BP 157/71 | HR 62 | Temp 96.8°F | Resp 18 | Ht 65.0 in | Wt 126.3 lb

## 2015-07-28 DIAGNOSIS — C9110 Chronic lymphocytic leukemia of B-cell type not having achieved remission: Secondary | ICD-10-CM

## 2015-07-28 DIAGNOSIS — D591 Autoimmune hemolytic anemia, unspecified: Secondary | ICD-10-CM

## 2015-07-28 DIAGNOSIS — D594 Other nonautoimmune hemolytic anemias: Secondary | ICD-10-CM

## 2015-07-28 DIAGNOSIS — R1013 Epigastric pain: Secondary | ICD-10-CM

## 2015-07-28 DIAGNOSIS — Z87442 Personal history of urinary calculi: Secondary | ICD-10-CM

## 2015-07-28 DIAGNOSIS — D589 Hereditary hemolytic anemia, unspecified: Secondary | ICD-10-CM

## 2015-07-28 DIAGNOSIS — R609 Edema, unspecified: Secondary | ICD-10-CM

## 2015-07-28 DIAGNOSIS — F419 Anxiety disorder, unspecified: Secondary | ICD-10-CM

## 2015-07-28 DIAGNOSIS — D479 Neoplasm of uncertain behavior of lymphoid, hematopoietic and related tissue, unspecified: Secondary | ICD-10-CM

## 2015-07-28 DIAGNOSIS — R5383 Other fatigue: Secondary | ICD-10-CM

## 2015-07-28 DIAGNOSIS — Z8781 Personal history of (healed) traumatic fracture: Secondary | ICD-10-CM

## 2015-07-28 DIAGNOSIS — Z9081 Acquired absence of spleen: Secondary | ICD-10-CM

## 2015-07-28 DIAGNOSIS — H5703 Miosis: Secondary | ICD-10-CM

## 2015-07-28 DIAGNOSIS — I351 Nonrheumatic aortic (valve) insufficiency: Secondary | ICD-10-CM

## 2015-07-28 DIAGNOSIS — R001 Bradycardia, unspecified: Secondary | ICD-10-CM

## 2015-07-28 DIAGNOSIS — E538 Deficiency of other specified B group vitamins: Secondary | ICD-10-CM

## 2015-07-28 DIAGNOSIS — I429 Cardiomyopathy, unspecified: Secondary | ICD-10-CM

## 2015-07-28 DIAGNOSIS — E7800 Pure hypercholesterolemia, unspecified: Secondary | ICD-10-CM

## 2015-07-28 DIAGNOSIS — R740 Nonspecific elevation of levels of transaminase and lactic acid dehydrogenase [LDH]: Secondary | ICD-10-CM

## 2015-07-28 DIAGNOSIS — I4510 Unspecified right bundle-branch block: Secondary | ICD-10-CM

## 2015-07-28 DIAGNOSIS — I340 Nonrheumatic mitral (valve) insufficiency: Secondary | ICD-10-CM

## 2015-07-28 DIAGNOSIS — Z8673 Personal history of transient ischemic attack (TIA), and cerebral infarction without residual deficits: Secondary | ICD-10-CM

## 2015-07-28 DIAGNOSIS — K4090 Unilateral inguinal hernia, without obstruction or gangrene, not specified as recurrent: Secondary | ICD-10-CM

## 2015-07-28 DIAGNOSIS — R0602 Shortness of breath: Secondary | ICD-10-CM

## 2015-07-28 LAB — CBC WITH DIFFERENTIAL/PLATELET
BASOS ABS: 0.3 10*3/uL — AB (ref 0–0.1)
Basophils Relative: 1 %
Eosinophils Absolute: 0 10*3/uL (ref 0–0.7)
Eosinophils Relative: 0 %
HEMATOCRIT: 31.1 % — AB (ref 40.0–52.0)
HEMOGLOBIN: 10.4 g/dL — AB (ref 13.0–18.0)
Lymphs Abs: 6.2 10*3/uL — ABNORMAL HIGH (ref 1.0–3.6)
MCH: 41.3 pg — ABNORMAL HIGH (ref 26.0–34.0)
MCHC: 33.5 g/dL (ref 32.0–36.0)
MCV: 123.1 fL — AB (ref 80.0–100.0)
Monocytes Absolute: 0.5 10*3/uL (ref 0.2–1.0)
Monocytes Relative: 3 %
NEUTROS ABS: 13.6 10*3/uL — AB (ref 1.4–6.5)
Neutrophils Relative %: 66 %
Platelets: 312 10*3/uL (ref 150–440)
RBC: 2.53 MIL/uL — AB (ref 4.40–5.90)
RDW: 13.7 % (ref 11.5–14.5)
WBC: 20.6 10*3/uL — ABNORMAL HIGH (ref 3.8–10.6)

## 2015-07-28 LAB — LACTATE DEHYDROGENASE: LDH: 713 U/L — AB (ref 98–192)

## 2015-07-28 LAB — COMPREHENSIVE METABOLIC PANEL
ALK PHOS: 65 U/L (ref 38–126)
ALT: 19 U/L (ref 17–63)
AST: 44 U/L — AB (ref 15–41)
Albumin: 4.3 g/dL (ref 3.5–5.0)
Anion gap: 3 — ABNORMAL LOW (ref 5–15)
BILIRUBIN TOTAL: 3.3 mg/dL — AB (ref 0.3–1.2)
BUN: 29 mg/dL — AB (ref 6–20)
CHLORIDE: 105 mmol/L (ref 101–111)
CO2: 29 mmol/L (ref 22–32)
CREATININE: 0.98 mg/dL (ref 0.61–1.24)
Calcium: 9.4 mg/dL (ref 8.9–10.3)
GFR calc Af Amer: >60 mL/min (ref >60)
Glucose, Bld: 102 mg/dL — ABNORMAL HIGH (ref 65–99)
Potassium: 4.9 mmol/L (ref 3.5–5.1)
Sodium: 137 mmol/L (ref 135–145)
TOTAL PROTEIN: 6.3 g/dL — AB (ref 6.5–8.1)

## 2015-07-28 MED ORDER — CYANOCOBALAMIN 1000 MCG/ML IJ SOLN
1000.0000 ug | INTRAMUSCULAR | Status: DC
Start: 2015-07-28 — End: 2015-08-17

## 2015-07-28 MED ORDER — ACYCLOVIR 200 MG PO CAPS: 200.0000 mg | ORAL_CAPSULE | Freq: Two times a day (BID) | ORAL | Status: AC

## 2015-07-28 MED ORDER — ALPRAZOLAM 0.25 MG PO TABS: 0.2500 mg | ORAL_TABLET | Freq: Every evening | ORAL | Status: DC | PRN

## 2015-07-28 NOTE — Telephone Encounter (Signed)
erroneous

## 2015-07-28 NOTE — Progress Notes (Signed)
[place] OFFICE PROGRESS NOTE  Patient Care Team: Juline Patch, MD as PCP - General (Family Medicine) Kathrynn Ducking, MD (Neurology) Forest Gleason, MD as Consulting Physician (Unknown Physician Specialty) Carlena Bjornstad, MD as Consulting Physician (Cardiology) Seeplaputhur Robinette Haines, MD (General Surgery)   SUMMARY OF ONCOLOGIC HISTORY:  # Hemolytic Anemia sec to CLL s/p Splenectomy 2002; July 2016- severe anemia Hb 5.6 s/p Rituxan [July 2016];Rituxa x2 [sep & Nov 2016]; tapered on pred feb 2017.  March 2017- ~7 [facility]] Re-StartPredisone. April 21st 2017- Re-Start Rituxan.   # CLL [wbc- 18; platelets-300s ] ; CD 20 positive; 11q/17p- NOT tested/ reported.    INTERVAL HISTORY:  82 year old male patient with above history of CLL/history of hemolytic anemia-  The most recent relapse in March 2017 is currently on prednisone 40 mg a day.  Patient's hemoglobin is fairly stable around 10/  However his LDH is going up ~900/  Also bilirubin is up around 3/ mostly indirect.   Patient complains of fatigue.  Shortness of breath with exertion. Denies any difficulty swallowing. No fevers or chills. He has noted yellowing of his eyes in the last few days- however this is improving.    REVIEW OF SYSTEMS:  A complete 10 point review of system is done which is negative except mentioned above/history of present illness.   PAST MEDICAL HISTORY :  Past Medical History  Diagnosis Date  . CLL (chronic lymphoblastic leukemia) 2006    Rituxan treatment 2006  . Atrial septal aneurysm 2009    Insignificant, no, January, 2009  . Infection of PEG site ([place])     cellulitis.Marland KitchenResolved  . Vertebral compression fracture (HCC)     multiple levels  . Herpes zoster     right chest  . Aortic insufficiency 2009    mild, Mild, echo, 2012  . Diastolic dysfunction     mild  . Chest pain     EF 55%, echo, October, 2009, possible inferior and posterior borderline hypokinesis, patient has  never  had cardiac catheterization  . Edema     EF 55%, echo, October, 2009, possible borderline inferior and posterior hypokinesis... heart catheterization has never been done(July 10, 2009)  . Myositis     Caused pulmonary insufficiency with muscle weakness in the past / Dr.Willis-neurology, Imuran and prednisone  . Calculus of kidney   . Inguinal hernia without mention of obstruction or gangrene, unilateral or unspecified, (not specified as recurrent)     right   . Unspecified gastritis and gastroduodenitis without mention of hemorrhage   . Abdominal pain, epigastric   . Nonspecific elevation of levels of transaminase or lactic acid dehydrogenase (LDH)   . Feeding difficulties and mismanagement   . Edema of male genital organs     Resolved, occurred during illness related to myositis  . Swelling of arm     left.Marland KitchenMarland KitchenResolved.... no DVT  . LFT elevation     Related to Imuran in the past... dose was adjusted  . Dyslipidemia     statins not used due to LFT abnormalities  . Shortness of breath     Breathing abnormalities related to muscle weakness from myositis  . RBBB (right bundle branch block)     Old  . Bradycardia     Sinus bradycardia, old  . Ejection fraction     EF 60%, echo, October, 2012  . Mitral regurgitation     Mild / moderate, echo, 2012  . Right ventricular dysfunction  Mild, echo, 2012  . Febrile illness     May, 2013, question sinusitis  . Polymyositis ([place]) 11/26/2012  . Hemolytic anemia ([place])   . Cellulitis   . Chronic diastolic CHF (congestive heart failure) ([place]) 03/17/2015    PAST SURGICAL HISTORY :   Past Surgical History  Procedure Laterality Date  . Splenectomy    . Hernia repair      left side  . Peg placement  06/2007  . Peg tube removal  12/2007  . Kyphosis surgery      multilevel vertebral  . Odontoid fracture surgery      anterior screw fixation  . Nasal sinus surgery      x2  . Cataract extraction      bi lateral  . Tee without  cardioversion N/A 07/01/2015    Procedure: TRANSESOPHAGEAL ECHOCARDIOGRAM (TEE);  Surgeon: Dixie Dials, MD;  [facility] ENDOSCOPY;  Service: Cardiovascular;  Laterality: N/A;    FAMILY HISTORY :   Family History  Problem Relation Age of Onset  . Breast cancer Sister   . Colon cancer Brother   . Prostate cancer Brother   . Stroke Mother   . Hypertension Mother   . Emphysema Father   . Diabetes Grandchild   . Heart attack Neg Hx   . Cancer Brother   . Cancer Sister   . Cancer Daughter     SOCIAL HISTORY:   Social History  Substance Use Topics  . Smoking status: Former Smoker -- 0.50 packs/day for 31 years    Types: Cigarettes    Quit date: 06/29/1968  . Smokeless tobacco: Former Systems developer    Types: Chew  . Alcohol Use: No     Comment: rarely    ALLERGIES:  is allergic to sulfonamide derivatives.  MEDICATIONS:  Current Outpatient Prescriptions  Medication Sig Dispense Refill  . albuterol (PROVENTIL) (2.5 MG/3ML) 0.083% nebulizer solution Take 3 mLs (2.5 mg total) by nebulization every 6 (six) hours as needed for wheezing or shortness of breath. 75 mL 12  . apixaban (ELIQUIS) 5 MG TABS tablet Take 1 tablet (5 mg total) by mouth 2 (two) times daily. 60 tablet 2  . calcium-vitamin D (CALCIUM 500+D) 500-200 MG-UNIT per tablet Take 1 tablet by mouth 2 (two) times daily.      . Cholecalciferol (VITAMIN D3) 1000 UNITS tablet Take 1,000 Units by mouth daily.      . citalopram (CELEXA) 40 MG tablet TAKE 1 TABLET (40 MG TOTAL) BY MOUTH DAILY. 90 tablet 0  . ferrous sulfate 325 (65 FE) MG tablet Take 325 mg by mouth daily with breakfast.      . fish oil-omega-3 fatty acids 1000 MG capsule Take 1 g by mouth daily at 12 noon.     . folic acid (FOLVITE) 1 MG tablet Take 4 tablets (4 mg total) by mouth daily. 120 tablet 0  . furosemide (LASIX) 80 MG tablet Take 1 tablet (80 mg total) by mouth daily. (Patient taking differently: Take 40 mg by mouth daily. ) 90 tablet 3  .  HYDROcodone-acetaminophen (NORCO/VICODIN) 5-325 MG tablet Take 1 tablet by mouth 3 (three) times daily. 30 tablet 0  . montelukast (SINGULAIR) 10 MG tablet Take 1 tablet (10 mg total) by mouth at bedtime. 1 tablet 11  . Multiple Vitamin (MULTIVITAMIN) capsule Take 1 capsule by mouth daily.      . mycophenolate (CELLCEPT) 500 MG tablet Take 1 tablet (500 mg total) by mouth daily. 90 tablet 3  . nystatin  cream (MYCOSTATIN) APPLY TOPICALLY TWICE DAILY 30 g 0  . pantoprazole (PROTONIX) 40 MG tablet TAKE 1 TABLET (40 MG TOTAL) BY MOUTH DAILY AT 12 NOON. 90 tablet 1  . potassium chloride SA (K-DUR,KLOR-CON) 20 MEQ tablet Take 1 tablet (20 mEq total) by mouth 2 (two) times daily. 180 tablet 3  . predniSONE (DELTASONE) 50 MG tablet Take 1 tablet (50 mg total) by mouth daily with breakfast. Prednisone 50 mg po daily x 7 days then Prednisone 45 mg po daily x 7 days then Prednisone 40 mg po daily x 7 day then Prednisone 35 mg po daily x 7 day then Prednisone 30 mg po daily x 7 day then Prednisone 25 mg daily 60 tablet 2  . PROAIR HFA 108 (90 BASE) MCG/ACT inhaler Inhale 2 puffs into the lungs every 6 (six) hours as needed for shortness of breath.   11  . Resource Beneprotein PACK Take 3 each by mouth 3 (three) times daily. Take 3g three times a day    . temazepam (RESTORIL) 30 MG capsule Take 1 capsule (30 mg total) by mouth at bedtime. 30 capsule 5  . apixaban (ELIQUIS) 5 MG TABS tablet Take 2 tablets (10 mg total) by mouth 2 (two) times daily at 10 AM and 5 PM. 3 tablet 0  . cyanocobalamin (,VITAMIN B-12,) 1000 MCG/ML injection Inject 1 mL (1,000 mcg total) into the muscle every 14 (fourteen) days. At the first of the month (Patient not taking: Reported on 07/28/2015) 10 mL 3  . Vancomycin (VANCOCIN) 750 MG/150ML SOLN Inject 150 mLs (750 mg total) into the vein every 12 (twelve) hours. (Patient not taking: Reported on 07/28/2015) 4000 mL 0   No current facility-administered medications for this visit.     PHYSICAL EXAMINATION: ECOG PERFORMANCE STATUS: 1 - Symptomatic but completely ambulatory  BP 157/71 mmHg  Pulse 62  Temp(Src) 96.8 F (36 C) (Tympanic)  Resp 18  Ht 5\' 5"  (1.651 m)  Wt 126 lb 5.2 oz (57.3 kg)  BMI 21.02 kg/m2  Filed Weights   07/28/15 0941  Weight: 126 lb 5.2 oz (57.3 kg)    GENERAL: Frail-appearing Caucasian male patient accompanied by his daughter. He is up and about by himself. Alert, no distress and comfortable.   EYES: no pallor; positive for jaundice OROPHARYNX: no thrush or ulceration; good dentition  NECK: supple, no masses felt LYMPH:  no palpable lymphadenopathy in the cervical, axillary or inguinal regions LUNGS: Bilateral coarse breath sounds. No wheeze or crackles HEART/CVS: regular rate & rhythm and no murmurs; No lower extremity edema ABDOMEN:abdomen soft, non-tender and normal bowel sounds Musculoskeletal:no cyanosis of digits and no clubbing  PSYCH: alert & oriented x 3 with fluent speech NEURO: no focal motor/sensory deficits SKIN:  no rashes or significant lesions  LABORATORY DATA:  I have reviewed the data as listed    Component Value Date/Time   NA 137 07/28/2015 0918   NA 140 06/17/2014 0933   NA 139 03/21/2014 1040   K 4.9 07/28/2015 0918   K 4.6 03/21/2014 1040   CL 105 07/28/2015 0918   CL 102 03/21/2014 1040   CO2 29 07/28/2015 0918   CO2 32 03/21/2014 1040   GLUCOSE 102* 07/28/2015 0918   GLUCOSE 75 06/17/2014 0933   GLUCOSE 93 03/21/2014 1040   BUN 29* 07/28/2015 0918   BUN 18 06/17/2014 0933   BUN 20* 03/21/2014 1040   CREATININE 0.98 07/28/2015 0918   CREATININE 1.06 03/21/2014 1040   CALCIUM 9.4  07/28/2015 0918   CALCIUM 9.2 03/21/2014 1040   PROT 6.3* 07/28/2015 0918   PROT 6.3 06/17/2014 0933   PROT 7.0 03/21/2014 1040   ALBUMIN 4.3 07/28/2015 0918   ALBUMIN 4.1 06/17/2014 0933   ALBUMIN 3.7 03/21/2014 1040   AST 44* 07/28/2015 0918   AST 36 03/21/2014 1040   ALT 19 07/28/2015 0918   ALT 22  03/21/2014 1040   ALKPHOS 65 07/28/2015 0918   ALKPHOS 92 03/21/2014 1040   BILITOT 3.3* 07/28/2015 0918   BILITOT 0.8 06/17/2014 0933   BILITOT 1.0 03/21/2014 1040   GFRNONAA >60 07/28/2015 0918   GFRNONAA >60 03/21/2014 1040   GFRNONAA >60 09/20/2013 0953   GFRAA >60 07/28/2015 0918   GFRAA >60 03/21/2014 1040   GFRAA >60 09/20/2013 0953    No results found for: SPEP, UPEP  Lab Results  Component Value Date   WBC 20.6* 07/28/2015   NEUTROABS 13.6* 07/28/2015   HGB 10.4* 07/28/2015   HCT 31.1* 07/28/2015   MCV 123.1* 07/28/2015   PLT 312 07/28/2015      Chemistry      Component Value Date/Time   NA 137 07/28/2015 0918   NA 140 06/17/2014 0933   NA 139 03/21/2014 1040   K 4.9 07/28/2015 0918   K 4.6 03/21/2014 1040   CL 105 07/28/2015 0918   CL 102 03/21/2014 1040   CO2 29 07/28/2015 0918   CO2 32 03/21/2014 1040   BUN 29* 07/28/2015 0918   BUN 18 06/17/2014 0933   BUN 20* 03/21/2014 1040   CREATININE 0.98 07/28/2015 0918   CREATININE 1.06 03/21/2014 1040      Component Value Date/Time   CALCIUM 9.4 07/28/2015 0918   CALCIUM 9.2 03/21/2014 1040   ALKPHOS 65 07/28/2015 0918   ALKPHOS 92 03/21/2014 1040   AST 44* 07/28/2015 0918   AST 36 03/21/2014 1040   ALT 19 07/28/2015 0918   ALT 22 03/21/2014 1040   BILITOT 3.3* 07/28/2015 0918   BILITOT 0.8 06/17/2014 0933   BILITOT 1.0 03/21/2014 1040       ASSESSMENT & PLAN:   # Hemolytic anemia- secondary to CLL- currently on prednisone 40mg  a day. The hemoglobin is around 10;  But the LDH is elevated at 900/ bilirubin elevated at 3 mostly indirect.  Clinically,  I'm concerned  That  Patient's  Hemolytic anemia  Is not responding well to the current dose of prednisone.  Given his age/  Recent infections/  And also  Recent prior use of  rituximab [ November 2016]-  I would recommend Treatment of his underlying CLL to  Treatment his hemolytic anemia.  I recommended Gazyva;  However patient is concerned about   Potential risk of infections on Gazyva/  Infusion reactions. We'll get CLL- Profile/  Also I GVH mutation status/ fish profile with the next lab work.  #  After lengthy discussion given the pros and cons of Gazyva versus Rituxan- Patient elected to proceed with Rituxan. This will be second line therapy as patient has not responded well to steroids.  Check hepatitis B surface antigen.   Start Rituxan weekly IV 4.  Again discussed the concerns of infections/ infusion reactions.  Also recommend starting the patient on  Acyclovir 200 mg twice a day.  #  Insomnia anxiety-  Recommend Xanax 0.25 mg at night as needed.  I did discuss my concerns of  Falls/ drowsiness  With the benzodiazepines especially in elderly.   #  Patient hopefully will  get started with the Rituxan infusion this week/  I'll see the patient back this third cycle of  Rituxan in approximately 3 weeks.  We'll get weekly CBC CMP and LDH.  Response/ CBC  Will clinically dictated the steroid dose.  # 40 minutes face-to-face with the patient discussing the above plan of care; more than 50% of time spent on prognosis/ natural history; counseling and coordination.    Cammie Sickle, MD 07/28/2015 9:46 AM

## 2015-07-29 ENCOUNTER — Other Ambulatory Visit: Payer: Self-pay | Admitting: *Deleted

## 2015-07-29 ENCOUNTER — Telehealth: Payer: Self-pay | Admitting: *Deleted

## 2015-07-29 ENCOUNTER — Ambulatory Visit: Payer: Medicare Other

## 2015-07-29 DIAGNOSIS — C9110 Chronic lymphocytic leukemia of B-cell type not having achieved remission: Secondary | ICD-10-CM

## 2015-07-29 LAB — HEPATITIS B SURFACE ANTIGEN: HEP B S AG: NEGATIVE

## 2015-07-29 MED ORDER — PREDNISONE 10 MG PO TABS: 40.0000 mg | ORAL_TABLET | Freq: Every day | ORAL | Status: DC

## 2015-07-29 NOTE — Telephone Encounter (Signed)
Spoke with patient. Pt called he does not have enough prednisone tablets left at home to last through the weekend. RN spoke with Dr. Rogue Bussing. V/o given to prescribe prednisone 40 mg daily (10 mg tablets). Pt will take 40 mg daily x 7 days. We will check his labs on Friday, April 28 and will determine the new dosing of steroids.  He also stated that his pharmacy only carries 92ml vials of b12 injections. We will ask his pharmacy to order either the 2ml vials or transfer this prescription to another pharmacy. He will contact our office back if he needs any assitance with a new rx to sent to a different pharmacy. He was only dispensed 2 vials (41ml/each).  I also discussed with the patient that he will have a treatment on Friday for the rituxan. He insurance approved this drug.  His 2nd treatment will be given on May 1 (Monday) as the [facility] will be closing at 12 noon on 08/07/15.  However, he will need to gave a lab apt in [place] on 08/07/15 to determine his cbc values.  He thanked me for calling.  Teach  Back process performed with patient.

## 2015-07-29 NOTE — Telephone Encounter (Signed)
[name] [name] @ [employer] contacted cancer center and left vm on clinical hotline. alprazolam 0.25 not approved by pt's insurance/denied coverage for this drug.  I contacted pt to let him know that drug was denied by insurance.

## 2015-07-30 ENCOUNTER — Inpatient Hospital Stay: Payer: Medicare Other

## 2015-07-31 ENCOUNTER — Inpatient Hospital Stay: Payer: Medicare Other

## 2015-07-31 ENCOUNTER — Encounter: Payer: Self-pay | Admitting: Internal Medicine

## 2015-07-31 VITALS — BP 142/62 | HR 62 | Temp 97.0°F | Resp 18

## 2015-07-31 DIAGNOSIS — C9110 Chronic lymphocytic leukemia of B-cell type not having achieved remission: Secondary | ICD-10-CM | POA: Diagnosis not present

## 2015-07-31 DIAGNOSIS — D591 Autoimmune hemolytic anemia, unspecified: Secondary | ICD-10-CM

## 2015-07-31 MED ORDER — DIPHENHYDRAMINE HCL 25 MG PO CAPS
50.0000 mg | ORAL_CAPSULE | Freq: Once | ORAL | Status: AC
  Administered 2015-07-31: 50 mg via ORAL
  Filled 2015-07-31: qty 2

## 2015-07-31 MED ORDER — RITUXIMAB CHEMO INJECTION 500 MG/50ML
375.0000 mg/m2 | Freq: Once | INTRAVENOUS | Status: DC
Start: 2015-07-31 — End: 2015-07-31

## 2015-07-31 MED ORDER — ACETAMINOPHEN 325 MG PO TABS
650.0000 mg | ORAL_TABLET | Freq: Once | ORAL | Status: AC
  Administered 2015-07-31: 650 mg via ORAL
  Filled 2015-07-31: qty 2

## 2015-07-31 MED ORDER — SODIUM CHLORIDE 0.9 % IV SOLN
375.0000 mg/m2 | Freq: Once | INTRAVENOUS | Status: AC
  Administered 2015-07-31: 600 mg via INTRAVENOUS
  Filled 2015-07-31: qty 60

## 2015-07-31 MED ORDER — SODIUM CHLORIDE 0.9 % IV SOLN
Freq: Once | INTRAVENOUS | Status: AC
  Administered 2015-07-31: 09:00:00 via INTRAVENOUS
  Filled 2015-07-31: qty 1000

## 2015-08-06 ENCOUNTER — Ambulatory Visit: Payer: Medicare Other

## 2015-08-06 LAB — COMP PANEL: LEUKEMIA/LYMPHOMA

## 2015-08-06 LAB — FISH HES LEUKEMIA, 4Q12 REA

## 2015-08-07 ENCOUNTER — Inpatient Hospital Stay: Payer: Medicare Other

## 2015-08-07 DIAGNOSIS — C9110 Chronic lymphocytic leukemia of B-cell type not having achieved remission: Secondary | ICD-10-CM

## 2015-08-07 DIAGNOSIS — F419 Anxiety disorder, unspecified: Secondary | ICD-10-CM

## 2015-08-07 DIAGNOSIS — E538 Deficiency of other specified B group vitamins: Secondary | ICD-10-CM

## 2015-08-07 DIAGNOSIS — D594 Other nonautoimmune hemolytic anemias: Secondary | ICD-10-CM

## 2015-08-07 DIAGNOSIS — D479 Neoplasm of uncertain behavior of lymphoid, hematopoietic and related tissue, unspecified: Secondary | ICD-10-CM

## 2015-08-07 LAB — CBC WITH DIFFERENTIAL/PLATELET
Basophils Absolute: 0.1 10*3/uL (ref 0–0.1)
Basophils Relative: 1 %
EOS ABS: 0 10*3/uL (ref 0–0.7)
Eosinophils Relative: 0 %
HCT: 33 % — ABNORMAL LOW (ref 40.0–52.0)
HEMOGLOBIN: 11.3 g/dL — AB (ref 13.0–18.0)
LYMPHS ABS: 1.4 10*3/uL (ref 1.0–3.6)
MCH: 40.1 pg — AB (ref 26.0–34.0)
MCHC: 34.2 g/dL (ref 32.0–36.0)
MCV: 117.2 fL — AB (ref 80.0–100.0)
Monocytes Absolute: 0.3 10*3/uL (ref 0.2–1.0)
Neutro Abs: 8.2 10*3/uL — ABNORMAL HIGH (ref 1.4–6.5)
Platelets: 268 10*3/uL (ref 150–440)
RBC: 2.82 MIL/uL — AB (ref 4.40–5.90)
RDW: 13 % (ref 11.5–14.5)
WBC: 10 10*3/uL (ref 3.8–10.6)

## 2015-08-07 LAB — COMPREHENSIVE METABOLIC PANEL
ALK PHOS: 54 U/L (ref 38–126)
ALT: 20 U/L (ref 17–63)
ANION GAP: 3 — AB (ref 5–15)
AST: 37 U/L (ref 15–41)
Albumin: 4.1 g/dL (ref 3.5–5.0)
BILIRUBIN TOTAL: 2.7 mg/dL — AB (ref 0.3–1.2)
BUN: 25 mg/dL — ABNORMAL HIGH (ref 6–20)
CALCIUM: 9.3 mg/dL (ref 8.9–10.3)
CO2: 30 mmol/L (ref 22–32)
Chloride: 103 mmol/L (ref 101–111)
Creatinine, Ser: 0.87 mg/dL (ref 0.61–1.24)
GFR calc non Af Amer: >60 mL/min (ref >60)
Glucose, Bld: 112 mg/dL — ABNORMAL HIGH (ref 65–99)
Potassium: 4.5 mmol/L (ref 3.5–5.1)
SODIUM: 136 mmol/L (ref 135–145)
TOTAL PROTEIN: 6.3 g/dL — AB (ref 6.5–8.1)

## 2015-08-07 LAB — LACTATE DEHYDROGENASE: LDH: 485 U/L — ABNORMAL HIGH (ref 98–192)

## 2015-08-10 ENCOUNTER — Other Ambulatory Visit: Payer: Self-pay | Admitting: Internal Medicine

## 2015-08-10 ENCOUNTER — Other Ambulatory Visit: Payer: Self-pay | Admitting: *Deleted

## 2015-08-10 ENCOUNTER — Inpatient Hospital Stay: Payer: Medicare Other | Attending: Internal Medicine

## 2015-08-10 VITALS — BP 122/57 | HR 54 | Resp 20

## 2015-08-10 DIAGNOSIS — Z8719 Personal history of other diseases of the digestive system: Secondary | ICD-10-CM | POA: Diagnosis not present

## 2015-08-10 DIAGNOSIS — E785 Hyperlipidemia, unspecified: Secondary | ICD-10-CM | POA: Insufficient documentation

## 2015-08-10 DIAGNOSIS — I340 Nonrheumatic mitral (valve) insufficiency: Secondary | ICD-10-CM | POA: Diagnosis not present

## 2015-08-10 DIAGNOSIS — Z8701 Personal history of pneumonia (recurrent): Secondary | ICD-10-CM | POA: Diagnosis not present

## 2015-08-10 DIAGNOSIS — Z9081 Acquired absence of spleen: Secondary | ICD-10-CM | POA: Insufficient documentation

## 2015-08-10 DIAGNOSIS — R079 Chest pain, unspecified: Secondary | ICD-10-CM | POA: Insufficient documentation

## 2015-08-10 DIAGNOSIS — R000 Tachycardia, unspecified: Secondary | ICD-10-CM | POA: Insufficient documentation

## 2015-08-10 DIAGNOSIS — I5032 Chronic diastolic (congestive) heart failure: Secondary | ICD-10-CM | POA: Diagnosis not present

## 2015-08-10 DIAGNOSIS — I351 Nonrheumatic aortic (valve) insufficiency: Secondary | ICD-10-CM | POA: Insufficient documentation

## 2015-08-10 DIAGNOSIS — Z809 Family history of malignant neoplasm, unspecified: Secondary | ICD-10-CM | POA: Diagnosis not present

## 2015-08-10 DIAGNOSIS — M810 Age-related osteoporosis without current pathological fracture: Secondary | ICD-10-CM | POA: Insufficient documentation

## 2015-08-10 DIAGNOSIS — D591 Autoimmune hemolytic anemia, unspecified: Secondary | ICD-10-CM

## 2015-08-10 DIAGNOSIS — K4090 Unilateral inguinal hernia, without obstruction or gangrene, not specified as recurrent: Secondary | ICD-10-CM | POA: Diagnosis not present

## 2015-08-10 DIAGNOSIS — Z87442 Personal history of urinary calculi: Secondary | ICD-10-CM | POA: Diagnosis not present

## 2015-08-10 DIAGNOSIS — Z800 Family history of malignant neoplasm of digestive organs: Secondary | ICD-10-CM | POA: Insufficient documentation

## 2015-08-10 DIAGNOSIS — R740 Nonspecific elevation of levels of transaminase and lactic acid dehydrogenase [LDH]: Secondary | ICD-10-CM | POA: Diagnosis not present

## 2015-08-10 DIAGNOSIS — I4510 Unspecified right bundle-branch block: Secondary | ICD-10-CM | POA: Insufficient documentation

## 2015-08-10 DIAGNOSIS — D589 Hereditary hemolytic anemia, unspecified: Secondary | ICD-10-CM | POA: Diagnosis not present

## 2015-08-10 DIAGNOSIS — Z79899 Other long term (current) drug therapy: Secondary | ICD-10-CM | POA: Diagnosis not present

## 2015-08-10 DIAGNOSIS — R944 Abnormal results of kidney function studies: Secondary | ICD-10-CM | POA: Insufficient documentation

## 2015-08-10 DIAGNOSIS — L03113 Cellulitis of right upper limb: Secondary | ICD-10-CM | POA: Diagnosis not present

## 2015-08-10 DIAGNOSIS — Z7952 Long term (current) use of systemic steroids: Secondary | ICD-10-CM | POA: Insufficient documentation

## 2015-08-10 DIAGNOSIS — Z87891 Personal history of nicotine dependence: Secondary | ICD-10-CM | POA: Diagnosis not present

## 2015-08-10 DIAGNOSIS — R609 Edema, unspecified: Secondary | ICD-10-CM | POA: Diagnosis not present

## 2015-08-10 DIAGNOSIS — Z7901 Long term (current) use of anticoagulants: Secondary | ICD-10-CM | POA: Insufficient documentation

## 2015-08-10 DIAGNOSIS — Z8673 Personal history of transient ischemic attack (TIA), and cerebral infarction without residual deficits: Secondary | ICD-10-CM | POA: Insufficient documentation

## 2015-08-10 DIAGNOSIS — D630 Anemia in neoplastic disease: Secondary | ICD-10-CM | POA: Diagnosis not present

## 2015-08-10 DIAGNOSIS — I519 Heart disease, unspecified: Secondary | ICD-10-CM | POA: Diagnosis not present

## 2015-08-10 DIAGNOSIS — C9110 Chronic lymphocytic leukemia of B-cell type not having achieved remission: Secondary | ICD-10-CM | POA: Diagnosis not present

## 2015-08-10 DIAGNOSIS — Z803 Family history of malignant neoplasm of breast: Secondary | ICD-10-CM | POA: Insufficient documentation

## 2015-08-10 MED ORDER — SODIUM CHLORIDE 0.9 % IV SOLN
375.0000 mg/m2 | Freq: Once | INTRAVENOUS | Status: AC
  Administered 2015-08-10: 600 mg via INTRAVENOUS
  Filled 2015-08-10: qty 50

## 2015-08-10 MED ORDER — DIPHENHYDRAMINE HCL 25 MG PO CAPS
50.0000 mg | ORAL_CAPSULE | Freq: Once | ORAL | Status: AC
  Administered 2015-08-10: 50 mg via ORAL
  Filled 2015-08-10: qty 2

## 2015-08-10 MED ORDER — SODIUM CHLORIDE 0.9 % IV SOLN
Freq: Once | INTRAVENOUS | Status: AC
  Administered 2015-08-10: 10:00:00 via INTRAVENOUS
  Filled 2015-08-10: qty 1000

## 2015-08-10 MED ORDER — SODIUM CHLORIDE 0.9 % IV SOLN: 375.0000 mg/m2 | Freq: Once | INTRAVENOUS | Status: DC

## 2015-08-10 MED ORDER — ACETAMINOPHEN 325 MG PO TABS
650.0000 mg | ORAL_TABLET | Freq: Once | ORAL | Status: AC
  Administered 2015-08-10: 650 mg via ORAL
  Filled 2015-08-10: qty 2

## 2015-08-11 ENCOUNTER — Other Ambulatory Visit: Payer: Self-pay

## 2015-08-12 LAB — COMP PANEL: LEUKEMIA/LYMPHOMA: Immunophenotypic Profile: 7

## 2015-08-13 ENCOUNTER — Ambulatory Visit: Payer: Medicare Other

## 2015-08-14 ENCOUNTER — Inpatient Hospital Stay: Payer: Medicare Other

## 2015-08-14 DIAGNOSIS — C9110 Chronic lymphocytic leukemia of B-cell type not having achieved remission: Secondary | ICD-10-CM | POA: Diagnosis not present

## 2015-08-14 LAB — CBC WITH DIFFERENTIAL/PLATELET
BASOS PCT: 1 %
Basophils Absolute: 0.1 10*3/uL (ref 0–0.1)
EOS ABS: 0 10*3/uL (ref 0–0.7)
Eosinophils Relative: 0 %
HCT: 32.9 % — ABNORMAL LOW (ref 40.0–52.0)
Hemoglobin: 11.3 g/dL — ABNORMAL LOW (ref 13.0–18.0)
LYMPHS ABS: 1.5 10*3/uL (ref 1.0–3.6)
Lymphocytes Relative: 13 %
MCH: 39.8 pg — ABNORMAL HIGH (ref 26.0–34.0)
MCHC: 34.4 g/dL (ref 32.0–36.0)
MCV: 115.7 fL — ABNORMAL HIGH (ref 80.0–100.0)
MONO ABS: 0.8 10*3/uL (ref 0.2–1.0)
Monocytes Relative: 7 %
NEUTROS ABS: 8.8 10*3/uL — AB (ref 1.4–6.5)
Neutrophils Relative %: 79 %
PLATELETS: 271 10*3/uL (ref 150–440)
RBC: 2.85 MIL/uL — ABNORMAL LOW (ref 4.40–5.90)
RDW: 13.2 % (ref 11.5–14.5)
WBC: 11.2 10*3/uL — ABNORMAL HIGH (ref 3.8–10.6)

## 2015-08-14 LAB — HEPATIC FUNCTION PANEL
ALK PHOS: 47 U/L (ref 38–126)
ALT: 20 U/L (ref 17–63)
AST: 41 U/L (ref 15–41)
Albumin: 3.9 g/dL (ref 3.5–5.0)
BILIRUBIN INDIRECT: 2.1 mg/dL — AB (ref 0.3–0.9)
Bilirubin, Direct: 0.4 mg/dL (ref 0.1–0.5)
TOTAL PROTEIN: 5.9 g/dL — AB (ref 6.5–8.1)
Total Bilirubin: 2.5 mg/dL — ABNORMAL HIGH (ref 0.3–1.2)

## 2015-08-14 LAB — LACTATE DEHYDROGENASE: LDH: 491 U/L — AB (ref 98–192)

## 2015-08-17 ENCOUNTER — Inpatient Hospital Stay (HOSPITAL_BASED_OUTPATIENT_CLINIC_OR_DEPARTMENT_OTHER): Payer: Medicare Other | Admitting: Internal Medicine

## 2015-08-17 ENCOUNTER — Inpatient Hospital Stay: Payer: Medicare Other

## 2015-08-17 VITALS — BP 149/66 | HR 73 | Temp 96.2°F | Resp 18 | Wt 127.9 lb

## 2015-08-17 VITALS — BP 149/71 | HR 61 | Temp 97.2°F | Resp 18

## 2015-08-17 DIAGNOSIS — Z87442 Personal history of urinary calculi: Secondary | ICD-10-CM

## 2015-08-17 DIAGNOSIS — Z809 Family history of malignant neoplasm, unspecified: Secondary | ICD-10-CM

## 2015-08-17 DIAGNOSIS — I519 Heart disease, unspecified: Secondary | ICD-10-CM

## 2015-08-17 DIAGNOSIS — E538 Deficiency of other specified B group vitamins: Secondary | ICD-10-CM

## 2015-08-17 DIAGNOSIS — Z7901 Long term (current) use of anticoagulants: Secondary | ICD-10-CM

## 2015-08-17 DIAGNOSIS — I351 Nonrheumatic aortic (valve) insufficiency: Secondary | ICD-10-CM

## 2015-08-17 DIAGNOSIS — F419 Anxiety disorder, unspecified: Secondary | ICD-10-CM

## 2015-08-17 DIAGNOSIS — D594 Other nonautoimmune hemolytic anemias: Secondary | ICD-10-CM

## 2015-08-17 DIAGNOSIS — Z79899 Other long term (current) drug therapy: Secondary | ICD-10-CM

## 2015-08-17 DIAGNOSIS — M810 Age-related osteoporosis without current pathological fracture: Secondary | ICD-10-CM

## 2015-08-17 DIAGNOSIS — D630 Anemia in neoplastic disease: Secondary | ICD-10-CM

## 2015-08-17 DIAGNOSIS — R609 Edema, unspecified: Secondary | ICD-10-CM

## 2015-08-17 DIAGNOSIS — D479 Neoplasm of uncertain behavior of lymphoid, hematopoietic and related tissue, unspecified: Secondary | ICD-10-CM

## 2015-08-17 DIAGNOSIS — I4510 Unspecified right bundle-branch block: Secondary | ICD-10-CM

## 2015-08-17 DIAGNOSIS — D591 Autoimmune hemolytic anemia, unspecified: Secondary | ICD-10-CM

## 2015-08-17 DIAGNOSIS — C9110 Chronic lymphocytic leukemia of B-cell type not having achieved remission: Secondary | ICD-10-CM

## 2015-08-17 DIAGNOSIS — R000 Tachycardia, unspecified: Secondary | ICD-10-CM

## 2015-08-17 DIAGNOSIS — Z8701 Personal history of pneumonia (recurrent): Secondary | ICD-10-CM

## 2015-08-17 DIAGNOSIS — I340 Nonrheumatic mitral (valve) insufficiency: Secondary | ICD-10-CM

## 2015-08-17 DIAGNOSIS — D589 Hereditary hemolytic anemia, unspecified: Secondary | ICD-10-CM

## 2015-08-17 DIAGNOSIS — R944 Abnormal results of kidney function studies: Secondary | ICD-10-CM

## 2015-08-17 DIAGNOSIS — K4090 Unilateral inguinal hernia, without obstruction or gangrene, not specified as recurrent: Secondary | ICD-10-CM

## 2015-08-17 DIAGNOSIS — Z803 Family history of malignant neoplasm of breast: Secondary | ICD-10-CM

## 2015-08-17 DIAGNOSIS — R740 Nonspecific elevation of levels of transaminase and lactic acid dehydrogenase [LDH]: Secondary | ICD-10-CM

## 2015-08-17 DIAGNOSIS — I5032 Chronic diastolic (congestive) heart failure: Secondary | ICD-10-CM

## 2015-08-17 DIAGNOSIS — Z7952 Long term (current) use of systemic steroids: Secondary | ICD-10-CM

## 2015-08-17 DIAGNOSIS — R079 Chest pain, unspecified: Secondary | ICD-10-CM

## 2015-08-17 DIAGNOSIS — Z87891 Personal history of nicotine dependence: Secondary | ICD-10-CM

## 2015-08-17 DIAGNOSIS — Z8719 Personal history of other diseases of the digestive system: Secondary | ICD-10-CM

## 2015-08-17 DIAGNOSIS — Z9081 Acquired absence of spleen: Secondary | ICD-10-CM

## 2015-08-17 DIAGNOSIS — Z800 Family history of malignant neoplasm of digestive organs: Secondary | ICD-10-CM

## 2015-08-17 DIAGNOSIS — E785 Hyperlipidemia, unspecified: Secondary | ICD-10-CM

## 2015-08-17 DIAGNOSIS — Z8673 Personal history of transient ischemic attack (TIA), and cerebral infarction without residual deficits: Secondary | ICD-10-CM

## 2015-08-17 LAB — CBC WITH DIFFERENTIAL/PLATELET
BASOS ABS: 0.1 10*3/uL (ref 0–0.1)
EOS ABS: 0.1 10*3/uL (ref 0–0.7)
Eosinophils Relative: 0 %
HCT: 34.1 % — ABNORMAL LOW (ref 40.0–52.0)
HEMOGLOBIN: 11.9 g/dL — AB (ref 13.0–18.0)
Lymphocytes Relative: 13 %
Lymphs Abs: 1.8 10*3/uL (ref 1.0–3.6)
MCH: 39.9 pg — ABNORMAL HIGH (ref 26.0–34.0)
MCHC: 34.9 g/dL (ref 32.0–36.0)
MCV: 114.4 fL — ABNORMAL HIGH (ref 80.0–100.0)
MONO ABS: 1.4 10*3/uL — AB (ref 0.2–1.0)
Monocytes Relative: 10 %
NEUTROS ABS: 10.9 10*3/uL — AB (ref 1.4–6.5)
PLATELETS: 304 10*3/uL (ref 150–440)
RBC: 2.98 MIL/uL — ABNORMAL LOW (ref 4.40–5.90)
RDW: 13.2 % (ref 11.5–14.5)
WBC: 14.3 10*3/uL — ABNORMAL HIGH (ref 3.8–10.6)

## 2015-08-17 LAB — COMPREHENSIVE METABOLIC PANEL
ALBUMIN: 3.7 g/dL (ref 3.5–5.0)
ALK PHOS: 52 U/L (ref 38–126)
ALT: 20 U/L (ref 17–63)
ANION GAP: 6 (ref 5–15)
AST: 39 U/L (ref 15–41)
BUN: 19 mg/dL (ref 6–20)
CALCIUM: 9.1 mg/dL (ref 8.9–10.3)
CHLORIDE: 101 mmol/L (ref 101–111)
CO2: 30 mmol/L (ref 22–32)
CREATININE: 0.91 mg/dL (ref 0.61–1.24)
GFR calc Af Amer: >60 mL/min (ref >60)
GFR calc non Af Amer: >60 mL/min (ref >60)
GLUCOSE: 149 mg/dL — AB (ref 65–99)
POTASSIUM: 3.9 mmol/L (ref 3.5–5.1)
SODIUM: 137 mmol/L (ref 135–145)
TOTAL PROTEIN: 5.8 g/dL — AB (ref 6.5–8.1)
Total Bilirubin: 2.7 mg/dL — ABNORMAL HIGH (ref 0.3–1.2)

## 2015-08-17 LAB — LACTATE DEHYDROGENASE: LDH: 471 U/L — ABNORMAL HIGH (ref 98–192)

## 2015-08-17 MED ORDER — PREDNISONE 10 MG PO TABS: 10.0000 mg | ORAL_TABLET | Freq: Every day | ORAL | Status: DC

## 2015-08-17 MED ORDER — ACETAMINOPHEN 325 MG PO TABS
650.0000 mg | ORAL_TABLET | Freq: Once | ORAL | Status: AC
  Administered 2015-08-17: 650 mg via ORAL
  Filled 2015-08-17: qty 2

## 2015-08-17 MED ORDER — CYANOCOBALAMIN 1000 MCG/ML IJ SOLN: 1000.0000 ug | INTRAMUSCULAR | Status: AC

## 2015-08-17 MED ORDER — DIPHENHYDRAMINE HCL 25 MG PO CAPS
50.0000 mg | ORAL_CAPSULE | Freq: Once | ORAL | Status: AC
  Administered 2015-08-17: 50 mg via ORAL
  Filled 2015-08-17: qty 2

## 2015-08-17 MED ORDER — SODIUM CHLORIDE 0.9 % IV SOLN: 375.0000 mg/m2 | Freq: Once | INTRAVENOUS | Status: DC

## 2015-08-17 MED ORDER — SODIUM CHLORIDE 0.9 % IV SOLN
Freq: Once | INTRAVENOUS | Status: AC
  Administered 2015-08-17: 10:00:00 via INTRAVENOUS
  Filled 2015-08-17: qty 1000

## 2015-08-17 MED ORDER — SODIUM CHLORIDE 0.9 % IV SOLN
375.0000 mg/m2 | Freq: Once | INTRAVENOUS | Status: AC
  Administered 2015-08-17: 600 mg via INTRAVENOUS
  Filled 2015-08-17: qty 50

## 2015-08-17 NOTE — Progress Notes (Signed)
[name] OFF[facility] PROGRESS NOTE  Patient Care Team: Juline Patch, MD as PCP - General (Family Medicine) Kathrynn Ducking, MD (Neurology) Forest Gleason, MD as Consulting Physician (Unknown Physician Specialty) Carlena Bjornstad, MD as Consulting Physician (Cardiology) Seeplaputhur Robinette Haines, MD (General Surgery)   SUMMARY OF ONCOLOGIC HISTORY:  # Hemolytic Anemia sec to CLL s/p Splenectomy 2002; July 2016- severe anemia Hb 5.6 s/p Rituxan [July 2016];Rituxa x2 [sep & Nov 2016]; tapered on pred feb 2017.  March 2017- ~7 [facility]] Re-StartPredisone. April 21st 2017-  Rituxan weekly x4;  # CLL [wbc- 18; platelets-300s ] ; CD 20 positive; May 2017- flow cytometry- Atypical CD5-, CD10- clonal B-Cell population, with non specific phenotype, representing < 5,000/ul [FISH neg; 11:14/ 11q/12;13q, 17p del]   INTERVAL HISTORY:  82 year old male patient with above history of CLL/history of hemolytic anemia-  The most recent relapse in March 2017 is currently on prednisone 20 mg/dayx 10 days.   Patient denies any worsening shortness of breath. Denies abdominal pain. Denies any skin yellowing or rash. Denies any difficulty swallowing. No fevers or chills.    REVIEW OF SYSTEMS:  A complete 10 point review of system is done which is negative except mentioned above/history of present illness.   PAST MEDICAL HISTORY :  Past Medical History  Diagnosis Date  . CLL (chronic lymphoblastic leukemia) 2006    Rituxan treatment 2006  . Atrial septal aneurysm 2009    Insignificant, no, January, 2009  . Infection of PEG site ([place])     cellulitis.Marland KitchenResolved  . Vertebral compression fracture (HCC)     multiple levels  . Herpes zoster     right chest  . Aortic insufficiency 2009    mild, Mild, echo, 2012  . Diastolic dysfunction     mild  . Chest pain     EF 55%, echo, October, 2009, possible inferior and posterior borderline hypokinesis, patient has never  had cardiac catheterization  .  Edema     EF 55%, echo, October, 2009, possible borderline inferior and posterior hypokinesis... heart catheterization has never been done(July 10, 2009)  . Myositis     Caused pulmonary insufficiency with muscle weakness in the past / Dr.Willis-neurology, Imuran and prednisone  . Calculus of kidney   . Inguinal hernia without mention of obstruction or gangrene, unilateral or unspecified, (not specified as recurrent)     right   . Unspecified gastritis and gastroduodenitis without mention of hemorrhage   . Abdominal pain, epigastric   . Nonspecific elevation of levels of transaminase or lactic acid dehydrogenase (LDH)   . Feeding difficulties and mismanagement   . Edema of male genital organs     Resolved, occurred during illness related to myositis  . Swelling of arm     left.Marland KitchenMarland KitchenResolved.... no DVT  . LFT elevation     Related to Imuran in the past... dose was adjusted  . Dyslipidemia     statins not used due to LFT abnormalities  . Shortness of breath     Breathing abnormalities related to muscle weakness from myositis  . RBBB (right bundle branch block)     Old  . Bradycardia     Sinus bradycardia, old  . Ejection fraction     EF 60%, echo, October, 2012  . Mitral regurgitation     Mild / moderate, echo, 2012  . Right ventricular dysfunction     Mild, echo, 2012  . Febrile illness     May, 2013, question  sinusitis  . Polymyositis ([place]) 11/26/2012  . Hemolytic anemia ([place])   . Cellulitis   . Chronic diastolic CHF (congestive heart failure) ([place]) 03/17/2015    PAST SURGICAL HISTORY :   Past Surgical History  Procedure Laterality Date  . Splenectomy    . Hernia repair      left side  . Peg placement  06/2007  . Peg tube removal  12/2007  . Kyphosis surgery      multilevel vertebral  . Odontoid fracture surgery      anterior screw fixation  . Nasal sinus surgery      x2  . Cataract extraction      bi lateral  . Tee without cardioversion N/A 07/01/2015    Procedure:  TRANSESOPHAGEAL ECHOCARDIOGRAM (TEE);  Surgeon: Dixie Dials, MD;  Location: [facility] ENDOSCOPY;  Service: Cardiovascular;  Laterality: N/A;    FAMILY HISTORY :   Family History  Problem Relation Age of Onset  . Breast cancer Sister   . Colon cancer Brother   . Prostate cancer Brother   . Stroke Mother   . Hypertension Mother   . Emphysema Father   . Diabetes Grandchild   . Heart attack Neg Hx   . Cancer Brother   . Cancer Sister   . Cancer Daughter     SOCIAL HISTORY:   Social History  Substance Use Topics  . Smoking status: Former Smoker -- 0.50 packs/day for 31 years    Types: Cigarettes    Quit date: 06/29/1968  . Smokeless tobacco: Former Systems developer    Types: Chew  . Alcohol Use: No     Comment: rarely    ALLERGIES:  is allergic to sulfonamide derivatives.  MEDICATIONS:  Current Outpatient Prescriptions  Medication Sig Dispense Refill  . acyclovir (ZOVIRAX) 200 MG capsule Take 1 capsule (200 mg total) by mouth 2 (two) times daily. 60 capsule 12  . albuterol (PROVENTIL) (2.5 MG/3ML) 0.083% nebulizer solution Take 3 mLs (2.5 mg total) by nebulization every 6 (six) hours as needed for wheezing or shortness of breath. 75 mL 12  . ALPRAZolam (XANAX) 0.25 MG tablet Take 1 tablet (0.25 mg total) by mouth at bedtime as needed for anxiety. 30 tablet 0  . apixaban (ELIQUIS) 5 MG TABS tablet Take 1 tablet (5 mg total) by mouth 2 (two) times daily. 60 tablet 2  . calcium-vitamin D (CALCIUM 500+D) 500-200 MG-UNIT per tablet Take 1 tablet by mouth 2 (two) times daily.      . Cholecalciferol (VITAMIN D3) 1000 UNITS tablet Take 1,000 Units by mouth daily.      . citalopram (CELEXA) 40 MG tablet TAKE 1 TABLET (40 MG TOTAL) BY MOUTH DAILY. 90 tablet 0  . cyanocobalamin (,VITAMIN B-12,) 1000 MCG/ML injection Inject 1 mL (1,000 mcg total) into the muscle every 14 (fourteen) days. At the first of the month 10 mL 6  . ferrous sulfate 325 (65 FE) MG tablet Take 325 mg by mouth daily with breakfast.       . fish oil-omega-3 fatty acids 1000 MG capsule Take 1 g by mouth daily at 12 noon.     . furosemide (LASIX) 80 MG tablet Take 1 tablet (80 mg total) by mouth daily. (Patient taking differently: Take 40 mg by mouth daily. ) 90 tablet 3  . HYDROcodone-acetaminophen (NORCO/VICODIN) 5-325 MG tablet Take 1 tablet by mouth 3 (three) times daily. 30 tablet 0  . montelukast (SINGULAIR) 10 MG tablet Take 1 tablet (10 mg total) by mouth at  bedtime. 1 tablet 11  . Multiple Vitamin (MULTIVITAMIN) capsule Take 1 capsule by mouth daily.      . mycophenolate (CELLCEPT) 500 MG tablet Take 1 tablet (500 mg total) by mouth daily. 90 tablet 3  . nystatin cream (MYCOSTATIN) APPLY TOPICALLY TWICE DAILY 30 g 0  . pantoprazole (PROTONIX) 40 MG tablet TAKE 1 TABLET (40 MG TOTAL) BY MOUTH DAILY AT 12 NOON. 90 tablet 1  . PROAIR HFA 108 (90 BASE) MCG/ACT inhaler Inhale 2 puffs into the lungs every 6 (six) hours as needed for shortness of breath.   11  . Resource Beneprotein PACK Take 3 each by mouth 3 (three) times daily. Take 3g three times a day    . temazepam (RESTORIL) 30 MG capsule Take 1 capsule (30 mg total) by mouth at bedtime. 30 capsule 5  . Vancomycin (VANCOCIN) 750 MG/150ML SOLN Inject 150 mLs (750 mg total) into the vein every 12 (twelve) hours. 4000 mL 0  . apixaban (ELIQUIS) 5 MG TABS tablet Take 2 tablets (10 mg total) by mouth 2 (two) times daily at 10 AM and 5 PM. 3 tablet 0  . folic acid (FOLVITE) 1 MG tablet Take 4 tablets (4 mg total) by mouth daily. 120 tablet 0  . potassium chloride SA (K-DUR,KLOR-CON) 20 MEQ tablet Take 1 tablet (20 mEq total) by mouth 2 (two) times daily. 180 tablet 3  . predniSONE (DELTASONE) 10 MG tablet Take 2 tablets by mouth daily.  2   No current facility-administered medications for this visit.    PHYSICAL EXAMINATION: ECOG PERFORMANCE STATUS: 1 - Symptomatic but completely ambulatory  BP 149/66 mmHg  Pulse 73  Temp(Src) 96.2 F (35.7 C) (Tympanic)  Resp 18  Wt  127 lb 13.9 oz (58 kg)  Filed Weights   08/17/15 0839  Weight: 127 lb 13.9 oz (58 kg)    GENERAL: Frail-appearing Caucasian male patient. He is up and about by himself. Alert, no distress and comfortable.   EYES: no pallor; no jaundice. OROPHARYNX: no thrush or ulceration; good dentition  NECK: supple, no masses felt LYMPH:  no palpable lymphadenopathy in the cervical, axillary or inguinal regions LUNGS: Bilateral coarse breath sounds. No wheeze or crackles HEART/CVS: regular rate & rhythm and no murmurs; No lower extremity edema ABDOMEN:abdomen soft, non-tender and normal bowel sounds Musculoskeletal:no cyanosis of digits and no clubbing  PSYCH: alert & oriented x 3 with fluent speech NEURO: no focal motor/sensory deficits SKIN:  no rashes or significant lesions  LABORATORY DATA:  I have reviewed the data as listed    Component Value Date/Time   NA 137 08/17/2015 0822   NA 140 06/17/2014 0933   NA 139 03/21/2014 1040   K 3.9 08/17/2015 0822   K 4.6 03/21/2014 1040   CL 101 08/17/2015 0822   CL 102 03/21/2014 1040   CO2 30 08/17/2015 0822   CO2 32 03/21/2014 1040   GLUCOSE 149* 08/17/2015 0822   GLUCOSE 75 06/17/2014 0933   GLUCOSE 93 03/21/2014 1040   BUN 19 08/17/2015 0822   BUN 18 06/17/2014 0933   BUN 20* 03/21/2014 1040   CREATININE 0.91 08/17/2015 0822   CREATININE 1.06 03/21/2014 1040   CALCIUM 9.1 08/17/2015 0822   CALCIUM 9.2 03/21/2014 1040   PROT 5.8* 08/17/2015 0822   PROT 6.3 06/17/2014 0933   PROT 7.0 03/21/2014 1040   ALBUMIN 3.7 08/17/2015 0822   ALBUMIN 4.1 06/17/2014 0933   ALBUMIN 3.7 03/21/2014 1040   AST 39 08/17/2015 [id number]  AST 36 03/21/2014 1040   ALT 20 08/17/2015 0822   ALT 22 03/21/2014 1040   ALKPHOS 52 08/17/2015 0822   ALKPHOS 92 03/21/2014 1040   BILITOT 2.7* 08/17/2015 0822   BILITOT 0.8 06/17/2014 0933   BILITOT 1.0 03/21/2014 1040   GFRNONAA >60 08/17/2015 0822   GFRNONAA >60 03/21/2014 1040   GFRNONAA >60 09/20/2013 0953    GFRAA >60 08/17/2015 0822   GFRAA >60 03/21/2014 1040   GFRAA >60 09/20/2013 0953    No results found for: SPEP, UPEP  Lab Results  Component Value Date   WBC 14.3* 08/17/2015   NEUTROABS 10.9* 08/17/2015   HGB 11.9* 08/17/2015   HCT 34.1* 08/17/2015   MCV 114.4* 08/17/2015   PLT 304 08/17/2015      Chemistry      Component Value Date/Time   NA 137 08/17/2015 0822   NA 140 06/17/2014 0933   NA 139 03/21/2014 1040   K 3.9 08/17/2015 0822   K 4.6 03/21/2014 1040   CL 101 08/17/2015 0822   CL 102 03/21/2014 1040   CO2 30 08/17/2015 0822   CO2 32 03/21/2014 1040   BUN 19 08/17/2015 0822   BUN 18 06/17/2014 0933   BUN 20* 03/21/2014 1040   CREATININE 0.91 08/17/2015 0822   CREATININE 1.06 03/21/2014 1040      Component Value Date/Time   CALCIUM 9.1 08/17/2015 0822   CALCIUM 9.2 03/21/2014 1040   ALKPHOS 52 08/17/2015 0822   ALKPHOS 92 03/21/2014 1040   AST 39 08/17/2015 0822   AST 36 03/21/2014 1040   ALT 20 08/17/2015 0822   ALT 22 03/21/2014 1040   BILITOT 2.7* 08/17/2015 0822   BILITOT 0.8 06/17/2014 0933   BILITOT 1.0 03/21/2014 1040       ASSESSMENT & PLAN:   # Hemolytic anemia- secondary to CLL-Currently on rituximab status post 2 treatments. Since we'll tolerating well. Hemoglobin study around 11.9 LDH 471 trending down. Patient will proceed with rituximab today and then #4 in 1 week. currently on prednisone 20mg  a day x 10 days; cut 10mg /d- we will slowly taper off prednisone based on patient's response/hemoglobin.   # CLL- Atypical phenotype noted on the peripheral blood flow cytometry; fish negative.   # Osteoporosis/ history of prolonged prednisone use- discussed regarding use of Zometa/bisphosphonates.   # Follow-up CBC and LFT/LDH weekly; follow-up with me in 4 weeks     Cammie Sickle, MD 08/17/2015 9:14 AM

## 2015-08-17 NOTE — Progress Notes (Signed)
Patient states he feels better today than he has in several weeks. Still having some difficulty sleeping.

## 2015-08-18 LAB — MISC LABCORP TEST (SEND OUT): Labcorp test code: 113753

## 2015-08-21 ENCOUNTER — Inpatient Hospital Stay: Payer: Medicare Other

## 2015-08-21 DIAGNOSIS — C9110 Chronic lymphocytic leukemia of B-cell type not having achieved remission: Secondary | ICD-10-CM

## 2015-08-21 LAB — CBC WITH DIFFERENTIAL/PLATELET
Basophils Absolute: 0 10*3/uL (ref 0–0.1)
EOS ABS: 0 10*3/uL (ref 0–0.7)
HCT: 34.6 % — ABNORMAL LOW (ref 40.0–52.0)
HEMOGLOBIN: 11.8 g/dL — AB (ref 13.0–18.0)
LYMPHS ABS: 3.4 10*3/uL (ref 1.0–3.6)
Lymphocytes Relative: 29 %
MCH: 39.1 pg — AB (ref 26.0–34.0)
MCHC: 34.1 g/dL (ref 32.0–36.0)
MCV: 114.6 fL — ABNORMAL HIGH (ref 80.0–100.0)
MONO ABS: 0.8 10*3/uL (ref 0.2–1.0)
Neutro Abs: 7.6 10*3/uL — ABNORMAL HIGH (ref 1.4–6.5)
PLATELETS: 305 10*3/uL (ref 150–440)
RBC: 3.02 MIL/uL — ABNORMAL LOW (ref 4.40–5.90)
RDW: 13 % (ref 11.5–14.5)
WBC: 11.9 10*3/uL — ABNORMAL HIGH (ref 3.8–10.6)

## 2015-08-21 LAB — LACTATE DEHYDROGENASE: LDH: 570 U/L — AB (ref 98–192)

## 2015-08-24 ENCOUNTER — Inpatient Hospital Stay: Payer: Medicare Other

## 2015-08-24 ENCOUNTER — Other Ambulatory Visit: Payer: Self-pay | Admitting: Internal Medicine

## 2015-08-24 VITALS — BP 148/61 | HR 69 | Temp 97.4°F

## 2015-08-24 DIAGNOSIS — D591 Autoimmune hemolytic anemia, unspecified: Secondary | ICD-10-CM

## 2015-08-24 DIAGNOSIS — C9110 Chronic lymphocytic leukemia of B-cell type not having achieved remission: Secondary | ICD-10-CM

## 2015-08-24 DIAGNOSIS — D479 Neoplasm of uncertain behavior of lymphoid, hematopoietic and related tissue, unspecified: Secondary | ICD-10-CM

## 2015-08-24 DIAGNOSIS — F419 Anxiety disorder, unspecified: Secondary | ICD-10-CM

## 2015-08-24 DIAGNOSIS — E538 Deficiency of other specified B group vitamins: Secondary | ICD-10-CM

## 2015-08-24 DIAGNOSIS — D594 Other nonautoimmune hemolytic anemias: Secondary | ICD-10-CM

## 2015-08-24 DIAGNOSIS — L03011 Cellulitis of right finger: Secondary | ICD-10-CM

## 2015-08-24 MED ORDER — SODIUM CHLORIDE 0.9 % IV SOLN: 375.0000 mg/m2 | Freq: Once | INTRAVENOUS | Status: DC

## 2015-08-24 MED ORDER — CEPHALEXIN 500 MG PO CAPS: 500.0000 mg | ORAL_CAPSULE | Freq: Three times a day (TID) | ORAL | Status: DC

## 2015-08-24 MED ORDER — ACETAMINOPHEN 325 MG PO TABS
650.0000 mg | ORAL_TABLET | Freq: Once | ORAL | Status: AC
  Administered 2015-08-24: 650 mg via ORAL
  Filled 2015-08-24: qty 2

## 2015-08-24 MED ORDER — SODIUM CHLORIDE 0.9 % IV SOLN
375.0000 mg/m2 | Freq: Once | INTRAVENOUS | Status: AC
  Administered 2015-08-24: 600 mg via INTRAVENOUS
  Filled 2015-08-24: qty 50

## 2015-08-24 MED ORDER — SODIUM CHLORIDE 0.9 % IV SOLN
Freq: Once | INTRAVENOUS | Status: AC
  Administered 2015-08-24: 09:00:00 via INTRAVENOUS
  Filled 2015-08-24: qty 1000

## 2015-08-24 MED ORDER — DIPHENHYDRAMINE HCL 25 MG PO CAPS
50.0000 mg | ORAL_CAPSULE | Freq: Once | ORAL | Status: AC
  Administered 2015-08-24: 50 mg via ORAL
  Filled 2015-08-24: qty 2

## 2015-08-24 NOTE — Progress Notes (Signed)
Patient notified, verbalized understanding of plan.

## 2015-08-26 ENCOUNTER — Encounter: Payer: Self-pay | Admitting: Internal Medicine

## 2015-08-28 ENCOUNTER — Inpatient Hospital Stay: Payer: Medicare Other

## 2015-08-28 DIAGNOSIS — C9110 Chronic lymphocytic leukemia of B-cell type not having achieved remission: Secondary | ICD-10-CM | POA: Diagnosis not present

## 2015-08-28 LAB — CBC WITH DIFFERENTIAL/PLATELET
Basophils Absolute: 0 10*3/uL (ref 0–0.1)
Basophils Relative: 0 %
EOS ABS: 0 10*3/uL (ref 0–0.7)
Eosinophils Relative: 0 %
HCT: 34.2 % — ABNORMAL LOW (ref 40.0–52.0)
HEMOGLOBIN: 11.5 g/dL — AB (ref 13.0–18.0)
LYMPHS ABS: 3.6 10*3/uL (ref 1.0–3.6)
MCH: 38.2 pg — AB (ref 26.0–34.0)
MCHC: 33.6 g/dL (ref 32.0–36.0)
MCV: 113.7 fL — ABNORMAL HIGH (ref 80.0–100.0)
Monocytes Absolute: 1.1 10*3/uL — ABNORMAL HIGH (ref 0.2–1.0)
Monocytes Relative: 8 %
Neutro Abs: 9.2 10*3/uL — ABNORMAL HIGH (ref 1.4–6.5)
Platelets: 407 10*3/uL (ref 150–440)
RBC: 3.01 MIL/uL — AB (ref 4.40–5.90)
RDW: 13.3 % (ref 11.5–14.5)
WBC: 14 10*3/uL — AB (ref 3.8–10.6)

## 2015-08-28 LAB — LACTATE DEHYDROGENASE: LDH: 631 U/L — AB (ref 98–192)

## 2015-08-31 ENCOUNTER — Telehealth: Payer: Self-pay | Admitting: *Deleted

## 2015-08-31 ENCOUNTER — Other Ambulatory Visit: Payer: Self-pay | Admitting: Internal Medicine

## 2015-08-31 NOTE — Telephone Encounter (Signed)
Called patient and left message that he should continue taking current dose of prednisone 10 mg/daily.  We will continue to monitor labs as scheduled.

## 2015-08-31 NOTE — Telephone Encounter (Signed)
-----   Message from Cammie Sickle, MD sent at 08/31/2015  8:01 AM EDT ----- Please inform pt to continue current dose of prednisone at 10mg /day. Continue checking labs- as scheduled. Thx

## 2015-09-01 ENCOUNTER — Inpatient Hospital Stay (HOSPITAL_BASED_OUTPATIENT_CLINIC_OR_DEPARTMENT_OTHER): Payer: Medicare Other | Admitting: Internal Medicine

## 2015-09-01 VITALS — BP 152/71 | HR 70 | Temp 97.7°F | Resp 18 | Wt 127.2 lb

## 2015-09-01 DIAGNOSIS — Z9081 Acquired absence of spleen: Secondary | ICD-10-CM

## 2015-09-01 DIAGNOSIS — Z809 Family history of malignant neoplasm, unspecified: Secondary | ICD-10-CM

## 2015-09-01 DIAGNOSIS — C9110 Chronic lymphocytic leukemia of B-cell type not having achieved remission: Secondary | ICD-10-CM

## 2015-09-01 DIAGNOSIS — I4510 Unspecified right bundle-branch block: Secondary | ICD-10-CM

## 2015-09-01 DIAGNOSIS — R740 Nonspecific elevation of levels of transaminase and lactic acid dehydrogenase [LDH]: Secondary | ICD-10-CM

## 2015-09-01 DIAGNOSIS — I519 Heart disease, unspecified: Secondary | ICD-10-CM

## 2015-09-01 DIAGNOSIS — Z8673 Personal history of transient ischemic attack (TIA), and cerebral infarction without residual deficits: Secondary | ICD-10-CM

## 2015-09-01 DIAGNOSIS — I5032 Chronic diastolic (congestive) heart failure: Secondary | ICD-10-CM

## 2015-09-01 DIAGNOSIS — R000 Tachycardia, unspecified: Secondary | ICD-10-CM

## 2015-09-01 DIAGNOSIS — M810 Age-related osteoporosis without current pathological fracture: Secondary | ICD-10-CM

## 2015-09-01 DIAGNOSIS — D589 Hereditary hemolytic anemia, unspecified: Secondary | ICD-10-CM

## 2015-09-01 DIAGNOSIS — Z803 Family history of malignant neoplasm of breast: Secondary | ICD-10-CM

## 2015-09-01 DIAGNOSIS — Z7901 Long term (current) use of anticoagulants: Secondary | ICD-10-CM

## 2015-09-01 DIAGNOSIS — D630 Anemia in neoplastic disease: Secondary | ICD-10-CM

## 2015-09-01 DIAGNOSIS — E785 Hyperlipidemia, unspecified: Secondary | ICD-10-CM

## 2015-09-01 DIAGNOSIS — L03113 Cellulitis of right upper limb: Secondary | ICD-10-CM

## 2015-09-01 DIAGNOSIS — R944 Abnormal results of kidney function studies: Secondary | ICD-10-CM

## 2015-09-01 DIAGNOSIS — R079 Chest pain, unspecified: Secondary | ICD-10-CM

## 2015-09-01 DIAGNOSIS — Z87442 Personal history of urinary calculi: Secondary | ICD-10-CM

## 2015-09-01 DIAGNOSIS — Z87891 Personal history of nicotine dependence: Secondary | ICD-10-CM

## 2015-09-01 DIAGNOSIS — Z800 Family history of malignant neoplasm of digestive organs: Secondary | ICD-10-CM

## 2015-09-01 DIAGNOSIS — Z8701 Personal history of pneumonia (recurrent): Secondary | ICD-10-CM

## 2015-09-01 DIAGNOSIS — R609 Edema, unspecified: Secondary | ICD-10-CM

## 2015-09-01 DIAGNOSIS — Z7952 Long term (current) use of systemic steroids: Secondary | ICD-10-CM

## 2015-09-01 DIAGNOSIS — Z8719 Personal history of other diseases of the digestive system: Secondary | ICD-10-CM

## 2015-09-01 DIAGNOSIS — I340 Nonrheumatic mitral (valve) insufficiency: Secondary | ICD-10-CM

## 2015-09-01 DIAGNOSIS — K4090 Unilateral inguinal hernia, without obstruction or gangrene, not specified as recurrent: Secondary | ICD-10-CM

## 2015-09-01 DIAGNOSIS — Z79899 Other long term (current) drug therapy: Secondary | ICD-10-CM

## 2015-09-01 DIAGNOSIS — I351 Nonrheumatic aortic (valve) insufficiency: Secondary | ICD-10-CM

## 2015-09-01 MED ORDER — AMOXICILLIN-POT CLAVULANATE 875-125 MG PO TABS: 1.0000 | ORAL_TABLET | Freq: Two times a day (BID) | ORAL | Status: DC

## 2015-09-01 NOTE — Progress Notes (Signed)
Patient acute add on for redness and warmth in right arm up to shoulder.  Patient states his hand was swollen two weeks ago with some redness.  Over the past week the redness has moved up the arm.

## 2015-09-01 NOTE — Progress Notes (Signed)
[facility] OFFICE PROGRESS NOTE  Patient Care Team: Juline Patch, MD as PCP - General (Family Medicine) Kathrynn Ducking, MD (Neurology) Forest Gleason, MD as Consulting Physician (Unknown Physician Specialty) Carlena Bjornstad, MD as Consulting Physician (Cardiology) Seeplaputhur Robinette Haines, MD (General Surgery)   SUMMARY OF ONCOLOGIC HISTORY:  # Hemolytic Anemia sec to CLL s/p Splenectomy 2002; July 2016- severe anemia Hb 5.6 s/p Rituxan [July 2016];Rituxa x2 [sep & Nov 2016]; tapered on pred feb 2017.  March 2017- ~7 [facility]] Re-StartPredisone. April 21st 2017-  Rituxan weekly x 4 [last May 5th 2017]  # CLL [wbc- 18; platelets-300s ] ; CD 20 positive; May 2017- flow cytometry- Atypical CD5-, CD10- clonal B-Cell population, with non specific phenotype, representing < 5,000/ul [FISH neg; 11:14/ 11q/12;13q, 17p del]   INTERVAL HISTORY:  82 year old male patient with above history of CLL/history of hemolytic anemia-  The most recent relapse in March 2017 is currently on prednisone 10 mg/day for last 10 days. He is status post 4 weekly treatments of Rituxan finished approximately 2 weeks ago.  Patient was started on Keflex for right upper extremity cellulitis approximately week ago. The redness improved on the hand/dorsal surface; however noted to have increasing redness and swelling of the flexor surface of the Elbow/right arm.   Patient denies any worsening shortness of breath. Denies abdominal pain. Denies any skin yellowing or rash. Denies any difficulty swallowing. No fevers or chills.    REVIEW OF SYSTEMS:  A complete 10 point review of system is done which is negative except mentioned above/history of present illness.   PAST MEDICAL HISTORY :  Past Medical History  Diagnosis Date  . CLL (chronic lymphoblastic leukemia) 2006    Rituxan treatment 2006  . Atrial septal aneurysm 2009    Insignificant, no, January, 2009  . Infection of PEG site ([place])    cellulitis.Marland KitchenResolved  . Vertebral compression fracture (HCC)     multiple levels  . Herpes zoster     right chest  . Aortic insufficiency 2009    mild, Mild, echo, 2012  . Diastolic dysfunction     mild  . Chest pain     EF 55%, echo, October, 2009, possible inferior and posterior borderline hypokinesis, patient has never  had cardiac catheterization  . Edema     EF 55%, echo, October, 2009, possible borderline inferior and posterior hypokinesis... heart catheterization has never been done(July 10, 2009)  . Myositis     Caused pulmonary insufficiency with muscle weakness in the past / Dr.Willis-neurology, Imuran and prednisone  . Calculus of kidney   . Inguinal hernia without mention of obstruction or gangrene, unilateral or unspecified, (not specified as recurrent)     right   . Unspecified gastritis and gastroduodenitis without mention of hemorrhage   . Abdominal pain, epigastric   . Nonspecific elevation of levels of transaminase or lactic acid dehydrogenase (LDH)   . Feeding difficulties and mismanagement   . Edema of male genital organs     Resolved, occurred during illness related to myositis  . Swelling of arm     left.Marland KitchenMarland KitchenResolved.... no DVT  . LFT elevation     Related to Imuran in the past... dose was adjusted  . Dyslipidemia     statins not used due to LFT abnormalities  . Shortness of breath     Breathing abnormalities related to muscle weakness from myositis  . RBBB (right bundle branch block)     Old  . Bradycardia  Sinus bradycardia, old  . Ejection fraction     EF 60%, echo, October, 2012  . Mitral regurgitation     Mild / moderate, echo, 2012  . Right ventricular dysfunction     Mild, echo, 2012  . Febrile illness     May, 2013, question sinusitis  . Polymyositis ([place]) 11/26/2012  . Hemolytic anemia ([place])   . Cellulitis   . Chronic diastolic CHF (congestive heart failure) ([place]) 03/17/2015    PAST SURGICAL HISTORY :   Past Surgical History  Procedure  Laterality Date  . Splenectomy    . Hernia repair      left side  . Peg placement  06/2007  . Peg tube removal  12/2007  . Kyphosis surgery      multilevel vertebral  . Odontoid fracture surgery      anterior screw fixation  . Nasal sinus surgery      x2  . Cataract extraction      bi lateral  . Tee without cardioversion N/A 07/01/2015    Procedure: TRANSESOPHAGEAL ECHOCARDIOGRAM (TEE);  Surgeon: Dixie Dials, MD;  Location: [facility] ENDOSCOPY;  Service: Cardiovascular;  Laterality: N/A;    FAMILY HISTORY :   Family History  Problem Relation Age of Onset  . Breast cancer Sister   . Colon cancer Brother   . Prostate cancer Brother   . Stroke Mother   . Hypertension Mother   . Emphysema Father   . Diabetes Grandchild   . Heart attack Neg Hx   . Cancer Brother   . Cancer Sister   . Cancer Daughter     SOCIAL HISTORY:   Social History  Substance Use Topics  . Smoking status: Former Smoker -- 0.50 packs/day for 31 years    Types: Cigarettes    Quit date: 06/29/1968  . Smokeless tobacco: Former Systems developer    Types: Chew  . Alcohol Use: No     Comment: rarely    ALLERGIES:  is allergic to sulfonamide derivatives.  MEDICATIONS:  Current Outpatient Prescriptions  Medication Sig Dispense Refill  . acyclovir (ZOVIRAX) 200 MG capsule Take 1 capsule (200 mg total) by mouth 2 (two) times daily. 60 capsule 12  . albuterol (PROVENTIL) (2.5 MG/3ML) 0.083% nebulizer solution Take 3 mLs (2.5 mg total) by nebulization every 6 (six) hours as needed for wheezing or shortness of breath. 75 mL 12  . ALPRAZolam (XANAX) 0.25 MG tablet Take 1 tablet (0.25 mg total) by mouth at bedtime as needed for anxiety. 30 tablet 0  . apixaban (ELIQUIS) 5 MG TABS tablet Take 1 tablet (5 mg total) by mouth 2 (two) times daily. 60 tablet 2  . calcium-vitamin D (CALCIUM 500+D) 500-200 MG-UNIT per tablet Take 1 tablet by mouth 2 (two) times daily.      . cephALEXin (KEFLEX) 500 MG capsule Take 1 capsule (500 mg total)  by mouth 3 (three) times daily. 30 capsule 0  . Cholecalciferol (VITAMIN D3) 1000 UNITS tablet Take 1,000 Units by mouth daily.      . citalopram (CELEXA) 40 MG tablet TAKE 1 TABLET (40 MG TOTAL) BY MOUTH DAILY. 90 tablet 0  . cyanocobalamin (,VITAMIN B-12,) 1000 MCG/ML injection Inject 1 mL (1,000 mcg total) into the muscle every 14 (fourteen) days. Please dispense 10 ml vial to patient. 10 mL 6  . ferrous sulfate 325 (65 FE) MG tablet Take 325 mg by mouth daily with breakfast.      . fish oil-omega-3 fatty acids 1000 MG capsule Take  1 g by mouth daily at 12 noon.     . folic acid (FOLVITE) 1 MG tablet Take 4 tablets (4 mg total) by mouth daily. 120 tablet 0  . furosemide (LASIX) 80 MG tablet Take 1 tablet (80 mg total) by mouth daily. (Patient taking differently: Take 40 mg by mouth daily. ) 90 tablet 3  . HYDROcodone-acetaminophen (NORCO/VICODIN) 5-325 MG tablet Take 1 tablet by mouth 3 (three) times daily. 30 tablet 0  . montelukast (SINGULAIR) 10 MG tablet Take 1 tablet (10 mg total) by mouth at bedtime. 1 tablet 11  . Multiple Vitamin (MULTIVITAMIN) capsule Take 1 capsule by mouth daily.      . mycophenolate (CELLCEPT) 500 MG tablet Take 1 tablet (500 mg total) by mouth daily. 90 tablet 3  . nystatin cream (MYCOSTATIN) APPLY TOPICALLY TWICE DAILY 30 g 0  . pantoprazole (PROTONIX) 40 MG tablet TAKE 1 TABLET (40 MG TOTAL) BY MOUTH DAILY AT 12 NOON. 90 tablet 1  . potassium chloride SA (K-DUR,KLOR-CON) 20 MEQ tablet Take 1 tablet (20 mEq total) by mouth 2 (two) times daily. 180 tablet 3  . predniSONE (DELTASONE) 10 MG tablet Take 1 tablet (10 mg total) by mouth daily. Do not stop until directed 30 tablet 1  . PROAIR HFA 108 (90 BASE) MCG/ACT inhaler Inhale 2 puffs into the lungs every 6 (six) hours as needed for shortness of breath.   11  . Resource Beneprotein PACK Take 3 each by mouth 3 (three) times daily. Take 3g three times a day    . temazepam (RESTORIL) 30 MG capsule Take 1 capsule (30 mg  total) by mouth at bedtime. 30 capsule 5  . Vancomycin (VANCOCIN) 750 MG/150ML SOLN Inject 150 mLs (750 mg total) into the vein every 12 (twelve) hours. 4000 mL 0  . apixaban (ELIQUIS) 5 MG TABS tablet Take 2 tablets (10 mg total) by mouth 2 (two) times daily at 10 AM and 5 PM. 3 tablet 0   No current facility-administered medications for this visit.    PHYSICAL EXAMINATION: ECOG PERFORMANCE STATUS: 1 - Symptomatic but completely ambulatory  BP 152/71 mmHg  Pulse 70  Temp(Src) 97.7 F (36.5 C) (Tympanic)  Resp 18  Wt 127 lb 3.3 oz (57.7 kg)  Filed Weights   09/01/15 1015  Weight: 127 lb 3.3 oz (57.7 kg)    GENERAL: Frail-appearing Caucasian male patient. He is up and about by himself. Alert, no distress and comfortable.   EYES: no pallor; no jaundice. OROPHARYNX: no thrush or ulceration; good dentition  NECK: supple, no masses felt LYMPH:  no palpable lymphadenopathy in the cervical, axillary or inguinal regions LUNGS: Bilateral coarse breath sounds. No wheeze or crackles HEART/CVS: regular rate & rhythm and no murmurs; No lower extremity edema ABDOMEN:abdomen soft, non-tender and normal bowel sounds Musculoskeletal:no cyanosis of digits and no clubbing  PSYCH: alert & oriented x 3 with fluent speech NEURO: no focal motor/sensory deficits SKIN: Erythema/mild swelling noted of the flexor surface of the right elbow and the right arm [this was marked with a pen]  LABORATORY DATA:  I have reviewed the data as listed    Component Value Date/Time   NA 137 08/17/2015 0822   NA 140 06/17/2014 0933   NA 139 03/21/2014 1040   K 3.9 08/17/2015 0822   K 4.6 03/21/2014 1040   CL 101 08/17/2015 0822   CL 102 03/21/2014 1040   CO2 30 08/17/2015 0822   CO2 32 03/21/2014 1040   GLUCOSE  149* 08/17/2015 0822   GLUCOSE 75 06/17/2014 0933   GLUCOSE 93 03/21/2014 1040   BUN 19 08/17/2015 0822   BUN 18 06/17/2014 0933   BUN 20* 03/21/2014 1040   CREATININE 0.91 08/17/2015 0822    CREATININE 1.06 03/21/2014 1040   CALCIUM 9.1 08/17/2015 0822   CALCIUM 9.2 03/21/2014 1040   PROT 5.8* 08/17/2015 0822   PROT 6.3 06/17/2014 0933   PROT 7.0 03/21/2014 1040   ALBUMIN 3.7 08/17/2015 0822   ALBUMIN 4.1 06/17/2014 0933   ALBUMIN 3.7 03/21/2014 1040   AST 39 08/17/2015 0822   AST 36 03/21/2014 1040   ALT 20 08/17/2015 0822   ALT 22 03/21/2014 1040   ALKPHOS 52 08/17/2015 0822   ALKPHOS 92 03/21/2014 1040   BILITOT 2.7* 08/17/2015 0822   BILITOT 0.8 06/17/2014 0933   BILITOT 1.0 03/21/2014 1040   GFRNONAA >60 08/17/2015 0822   GFRNONAA >60 03/21/2014 1040   GFRNONAA >60 09/20/2013 0953   GFRAA >60 08/17/2015 0822   GFRAA >60 03/21/2014 1040   GFRAA >60 09/20/2013 0953    No results found for: SPEP, UPEP  Lab Results  Component Value Date   WBC 14.0* 08/28/2015   NEUTROABS 9.2* 08/28/2015   HGB 11.5* 08/28/2015   HCT 34.2* 08/28/2015   MCV 113.7* 08/28/2015   PLT 407 08/28/2015      Chemistry      Component Value Date/Time   NA 137 08/17/2015 0822   NA 140 06/17/2014 0933   NA 139 03/21/2014 1040   K 3.9 08/17/2015 0822   K 4.6 03/21/2014 1040   CL 101 08/17/2015 0822   CL 102 03/21/2014 1040   CO2 30 08/17/2015 0822   CO2 32 03/21/2014 1040   BUN 19 08/17/2015 0822   BUN 18 06/17/2014 0933   BUN 20* 03/21/2014 1040   CREATININE 0.91 08/17/2015 0822   CREATININE 1.06 03/21/2014 1040      Component Value Date/Time   CALCIUM 9.1 08/17/2015 0822   CALCIUM 9.2 03/21/2014 1040   ALKPHOS 52 08/17/2015 0822   ALKPHOS 92 03/21/2014 1040   AST 39 08/17/2015 0822   AST 36 03/21/2014 1040   ALT 20 08/17/2015 0822   ALT 22 03/21/2014 1040   BILITOT 2.7* 08/17/2015 0822   BILITOT 0.8 06/17/2014 0933   BILITOT 1.0 03/21/2014 1040       ASSESSMENT & PLAN:   # Hemolytic anemia- secondary to CLL-Currently  status post 4 treatments of rituximabApproximately 2 weeks ago; also on prednisone 10 mg for the last 10 days. Patient's hemoglobin is holding  at 11; however patient's LDH is slowly going up- most recent 4 days ago was 671. Discussed with the patient that Rituxan might take some time to work. However if his LDH continues to go up/ hemoglobin drops- I would recommend subsequent line of treatment treatment like Gazyva.   # Right upper extremity cellulitis- some improvement noted after Keflex however new area/worse- changing antibiotic to Augmentin 875 twice a day. New prescription given.  # CLL- Atypical phenotype noted on the peripheral blood flow cytometry; fish negative.   # Osteoporosis/ history of prolonged prednisone use- discussed regarding use of Zometa/bisphosphonates.   # Follow-up CBC and LFT/LDH weekly; follow-up with me i as scheduled on June 2.  # 25 minutes face-to-face with the patient discussing the above plan of care; more than 50% of time spent on prognosis/ natural history; counseling and coordination.    Cammie Sickle, MD 09/01/2015 10:20 AM

## 2015-09-04 ENCOUNTER — Inpatient Hospital Stay: Payer: Medicare Other

## 2015-09-04 ENCOUNTER — Telehealth: Payer: Self-pay | Admitting: *Deleted

## 2015-09-04 DIAGNOSIS — C9110 Chronic lymphocytic leukemia of B-cell type not having achieved remission: Secondary | ICD-10-CM

## 2015-09-04 LAB — CBC WITH DIFFERENTIAL/PLATELET
BASOS PCT: 1 %
Basophils Absolute: 0.1 10*3/uL (ref 0–0.1)
EOS ABS: 0 10*3/uL (ref 0–0.7)
Eosinophils Relative: 0 %
HCT: 35.9 % — ABNORMAL LOW (ref 40.0–52.0)
HEMOGLOBIN: 12 g/dL — AB (ref 13.0–18.0)
LYMPHS ABS: 2.1 10*3/uL (ref 1.0–3.6)
Lymphocytes Relative: 16 %
MCH: 37.1 pg — AB (ref 26.0–34.0)
MCHC: 33.4 g/dL (ref 32.0–36.0)
MCV: 111.1 fL — ABNORMAL HIGH (ref 80.0–100.0)
MONO ABS: 0.8 10*3/uL (ref 0.2–1.0)
Monocytes Relative: 6 %
NEUTROS PCT: 77 %
Neutro Abs: 10.2 10*3/uL — ABNORMAL HIGH (ref 1.4–6.5)
PLATELETS: 448 10*3/uL — AB (ref 150–440)
RBC: 3.23 MIL/uL — AB (ref 4.40–5.90)
RDW: 13.5 % (ref 11.5–14.5)
WBC: 13.2 10*3/uL — AB (ref 3.8–10.6)

## 2015-09-04 LAB — LACTATE DEHYDROGENASE: LDH: 605 U/L — ABNORMAL HIGH (ref 98–192)

## 2015-09-04 NOTE — Telephone Encounter (Signed)
-----   Message from Cammie Sickle, MD sent at 09/04/2015  1:45 PM EDT ----- Please inform patient blood counts stable; continue current dose of prednisone/labs as planned.

## 2015-09-04 NOTE — Telephone Encounter (Signed)
Called patient to let him know his blood counts are normal.  He should continue on current Prednisone dosage and follow up with labs as planned.

## 2015-09-10 ENCOUNTER — Other Ambulatory Visit: Payer: Self-pay

## 2015-09-11 ENCOUNTER — Inpatient Hospital Stay: Payer: Medicare Other | Attending: Internal Medicine

## 2015-09-11 ENCOUNTER — Inpatient Hospital Stay (HOSPITAL_BASED_OUTPATIENT_CLINIC_OR_DEPARTMENT_OTHER): Payer: Medicare Other | Admitting: Internal Medicine

## 2015-09-11 VITALS — BP 137/72 | HR 70 | Temp 97.6°F | Resp 18 | Wt 132.9 lb

## 2015-09-11 DIAGNOSIS — M609 Myositis, unspecified: Secondary | ICD-10-CM

## 2015-09-11 DIAGNOSIS — I340 Nonrheumatic mitral (valve) insufficiency: Secondary | ICD-10-CM | POA: Insufficient documentation

## 2015-09-11 DIAGNOSIS — E785 Hyperlipidemia, unspecified: Secondary | ICD-10-CM | POA: Insufficient documentation

## 2015-09-11 DIAGNOSIS — R109 Unspecified abdominal pain: Secondary | ICD-10-CM | POA: Insufficient documentation

## 2015-09-11 DIAGNOSIS — L03113 Cellulitis of right upper limb: Secondary | ICD-10-CM | POA: Insufficient documentation

## 2015-09-11 DIAGNOSIS — M818 Other osteoporosis without current pathological fracture: Secondary | ICD-10-CM

## 2015-09-11 DIAGNOSIS — I4510 Unspecified right bundle-branch block: Secondary | ICD-10-CM

## 2015-09-11 DIAGNOSIS — R001 Bradycardia, unspecified: Secondary | ICD-10-CM | POA: Insufficient documentation

## 2015-09-11 DIAGNOSIS — Z801 Family history of malignant neoplasm of trachea, bronchus and lung: Secondary | ICD-10-CM

## 2015-09-11 DIAGNOSIS — Z803 Family history of malignant neoplasm of breast: Secondary | ICD-10-CM

## 2015-09-11 DIAGNOSIS — R609 Edema, unspecified: Secondary | ICD-10-CM | POA: Diagnosis not present

## 2015-09-11 DIAGNOSIS — C9110 Chronic lymphocytic leukemia of B-cell type not having achieved remission: Secondary | ICD-10-CM

## 2015-09-11 DIAGNOSIS — Z8781 Personal history of (healed) traumatic fracture: Secondary | ICD-10-CM | POA: Diagnosis not present

## 2015-09-11 DIAGNOSIS — D589 Hereditary hemolytic anemia, unspecified: Secondary | ICD-10-CM | POA: Insufficient documentation

## 2015-09-11 DIAGNOSIS — Z87442 Personal history of urinary calculi: Secondary | ICD-10-CM

## 2015-09-11 DIAGNOSIS — I5032 Chronic diastolic (congestive) heart failure: Secondary | ICD-10-CM | POA: Insufficient documentation

## 2015-09-11 DIAGNOSIS — Z8719 Personal history of other diseases of the digestive system: Secondary | ICD-10-CM

## 2015-09-11 DIAGNOSIS — Z79899 Other long term (current) drug therapy: Secondary | ICD-10-CM

## 2015-09-11 DIAGNOSIS — I771 Stricture of artery: Secondary | ICD-10-CM

## 2015-09-11 DIAGNOSIS — Z87891 Personal history of nicotine dependence: Secondary | ICD-10-CM | POA: Insufficient documentation

## 2015-09-11 DIAGNOSIS — K449 Diaphragmatic hernia without obstruction or gangrene: Secondary | ICD-10-CM | POA: Insufficient documentation

## 2015-09-11 DIAGNOSIS — R740 Nonspecific elevation of levels of transaminase and lactic acid dehydrogenase [LDH]: Secondary | ICD-10-CM | POA: Diagnosis not present

## 2015-09-11 DIAGNOSIS — Z8673 Personal history of transient ischemic attack (TIA), and cerebral infarction without residual deficits: Secondary | ICD-10-CM | POA: Diagnosis not present

## 2015-09-11 DIAGNOSIS — Z8042 Family history of malignant neoplasm of prostate: Secondary | ICD-10-CM | POA: Insufficient documentation

## 2015-09-11 DIAGNOSIS — Z809 Family history of malignant neoplasm, unspecified: Secondary | ICD-10-CM | POA: Insufficient documentation

## 2015-09-11 LAB — CBC WITH DIFFERENTIAL/PLATELET
Basophils Absolute: 0.2 10*3/uL — ABNORMAL HIGH (ref 0–0.1)
Basophils Relative: 1 %
EOS ABS: 0 10*3/uL (ref 0–0.7)
Eosinophils Relative: 0 %
HCT: 34.9 % — ABNORMAL LOW (ref 40.0–52.0)
HEMOGLOBIN: 11.9 g/dL — AB (ref 13.0–18.0)
LYMPHS ABS: 1.9 10*3/uL (ref 1.0–3.6)
Lymphocytes Relative: 14 %
MCH: 37.1 pg — AB (ref 26.0–34.0)
MCHC: 34.1 g/dL (ref 32.0–36.0)
MCV: 108.9 fL — ABNORMAL HIGH (ref 80.0–100.0)
Monocytes Absolute: 0.9 10*3/uL (ref 0.2–1.0)
Neutro Abs: 11.1 10*3/uL — ABNORMAL HIGH (ref 1.4–6.5)
Platelets: 346 10*3/uL (ref 150–440)
RBC: 3.2 MIL/uL — ABNORMAL LOW (ref 4.40–5.90)
RDW: 13.5 % (ref 11.5–14.5)
WBC: 14.1 10*3/uL — ABNORMAL HIGH (ref 3.8–10.6)

## 2015-09-11 LAB — BASIC METABOLIC PANEL
Anion gap: 4 — ABNORMAL LOW (ref 5–15)
BUN: 21 mg/dL — AB (ref 6–20)
CHLORIDE: 101 mmol/L (ref 101–111)
CO2: 29 mmol/L (ref 22–32)
CREATININE: 0.8 mg/dL (ref 0.61–1.24)
Calcium: 9 mg/dL (ref 8.9–10.3)
GFR calc Af Amer: >60 mL/min (ref >60)
GFR calc non Af Amer: >60 mL/min (ref >60)
Glucose, Bld: 114 mg/dL — ABNORMAL HIGH (ref 65–99)
Potassium: 5 mmol/L (ref 3.5–5.1)
SODIUM: 134 mmol/L — AB (ref 135–145)

## 2015-09-11 LAB — LACTATE DEHYDROGENASE: LDH: 571 U/L — AB (ref 98–192)

## 2015-09-11 NOTE — Progress Notes (Signed)
Patient states he fell in his garage this past week and hurt his back.  States he is okay when sitting but has pain when he is moving.

## 2015-09-11 NOTE — Progress Notes (Signed)
[place] OFFICE PROGRESS NOTE  Patient Care Team: Juline Patch, MD as PCP - General (Family Medicine) Kathrynn Ducking, MD (Neurology) Forest Gleason, MD as Consulting Physician (Unknown Physician Specialty) Carlena Bjornstad, MD as Consulting Physician (Cardiology) Seeplaputhur Robinette Haines, MD (General Surgery) Minna Merritts, MD as Consulting Physician (Cardiology)   SUMMARY OF ONCOLOGIC HISTORY:  # Hemolytic Anemia sec to CLL s/p Splenectomy 2002; July 2016- severe anemia Hb 5.6 s/p Rituxan [July 2016];Rituxa x2 [sep & Nov 2016]; tapered on pred feb 2017.  March 2017- ~7 [facility]] Re-StartPredisone. April 21st 2017-  Rituxan weekly x 4 [last May 8th 2017]  # CLL [wbc- 18; platelets-300s ] ; CD 20 positive; May 2017- flow cytometry- Atypical CD5-, CD10- clonal B-Cell population, with non specific phenotype, representing < 5,000/ul [FISH neg; 11:14/ 11q/12;13q, 17p del]   INTERVAL HISTORY:  82 year old male patient with above history of CLL/history of hemolytic anemia-  The most recent relapse in March 2017 is currently on prednisone 10 mg/day for last 10 days. He is status post 4 weekly treatments of Rituxan finished last May 8th.   Patient noted significant improvement of his right upper extremity cellulitis since starting on Augmentin. No fevers.    Patient denies any worsening shortness of breath. Denies abdominal pain. Denies any skin yellowing or rash. Denies any difficulty swallowing. No fevers or chills.   He had a recent mechanical fall hit his left side of the back; mild pain not any worse. No difficulty breathing.   REVIEW OF SYSTEMS:  A complete 10 point review of system is done which is negative except mentioned above/history of present illness.   PAST MEDICAL HISTORY :  Past Medical History  Diagnosis Date  . CLL (chronic lymphoblastic leukemia) 2006    Rituxan treatment 2006  . Atrial septal aneurysm 2009    Insignificant, no, January, 2009  .  Infection of PEG site ([place])     cellulitis.Marland KitchenResolved  . Vertebral compression fracture (HCC)     multiple levels  . Herpes zoster     right chest  . Aortic insufficiency 2009    mild, Mild, echo, 2012  . Diastolic dysfunction     mild  . Chest pain     EF 55%, echo, October, 2009, possible inferior and posterior borderline hypokinesis, patient has never  had cardiac catheterization  . Edema     EF 55%, echo, October, 2009, possible borderline inferior and posterior hypokinesis... heart catheterization has never been done(July 10, 2009)  . Myositis     Caused pulmonary insufficiency with muscle weakness in the past / Dr.Willis-neurology, Imuran and prednisone  . Calculus of kidney   . Inguinal hernia without mention of obstruction or gangrene, unilateral or unspecified, (not specified as recurrent)     right   . Unspecified gastritis and gastroduodenitis without mention of hemorrhage   . Abdominal pain, epigastric   . Nonspecific elevation of levels of transaminase or lactic acid dehydrogenase (LDH)   . Feeding difficulties and mismanagement   . Edema of male genital organs     Resolved, occurred during illness related to myositis  . Swelling of arm     left.Marland KitchenMarland KitchenResolved.... no DVT  . LFT elevation     Related to Imuran in the past... dose was adjusted  . Dyslipidemia     statins not used due to LFT abnormalities  . Shortness of breath     Breathing abnormalities related to muscle weakness from myositis  .  RBBB (right bundle branch block)     Old  . Bradycardia     Sinus bradycardia, old  . Ejection fraction     EF 60%, echo, October, 2012  . Mitral regurgitation     Mild / moderate, echo, 2012  . Right ventricular dysfunction     Mild, echo, 2012  . Febrile illness     May, 2013, question sinusitis  . Polymyositis ([place]) 11/26/2012  . Hemolytic anemia ([place])   . Cellulitis   . Chronic diastolic CHF (congestive heart failure) ([place]) 03/17/2015    PAST SURGICAL HISTORY :    Past Surgical History  Procedure Laterality Date  . Splenectomy    . Hernia repair      left side  . Peg placement  06/2007  . Peg tube removal  12/2007  . Kyphosis surgery      multilevel vertebral  . Odontoid fracture surgery      anterior screw fixation  . Nasal sinus surgery      x2  . Cataract extraction      bi lateral  . Tee without cardioversion N/A 07/01/2015    Procedure: TRANSESOPHAGEAL ECHOCARDIOGRAM (TEE);  Surgeon: Dixie Dials, MD;  Location: [facility] ENDOSCOPY;  Service: Cardiovascular;  Laterality: N/A;    FAMILY HISTORY :   Family History  Problem Relation Age of Onset  . Breast cancer Sister   . Colon cancer Brother   . Prostate cancer Brother   . Stroke Mother   . Hypertension Mother   . Emphysema Father   . Diabetes Grandchild   . Heart attack Neg Hx   . Cancer Brother   . Cancer Sister   . Cancer Daughter     SOCIAL HISTORY:   Social History  Substance Use Topics  . Smoking status: Former Smoker -- 0.50 packs/day for 31 years    Types: Cigarettes    Quit date: 06/29/1968  . Smokeless tobacco: Former Systems developer    Types: Chew  . Alcohol Use: No     Comment: rarely    ALLERGIES:  is allergic to sulfonamide derivatives.  MEDICATIONS:  Current Outpatient Prescriptions  Medication Sig Dispense Refill  . acyclovir (ZOVIRAX) 200 MG capsule Take 1 capsule (200 mg total) by mouth 2 (two) times daily. 60 capsule 12  . albuterol (PROVENTIL) (2.5 MG/3ML) 0.083% nebulizer solution Take 3 mLs (2.5 mg total) by nebulization every 6 (six) hours as needed for wheezing or shortness of breath. 75 mL 12  . ALPRAZolam (XANAX) 0.25 MG tablet Take 1 tablet (0.25 mg total) by mouth at bedtime as needed for anxiety. 30 tablet 0  . amoxicillin-clavulanate (AUGMENTIN) 875-125 MG tablet Take 1 tablet by mouth 2 (two) times daily. 20 tablet 0  . apixaban (ELIQUIS) 5 MG TABS tablet Take 1 tablet (5 mg total) by mouth 2 (two) times daily. 60 tablet 2  . calcium-vitamin D (CALCIUM  500+D) 500-200 MG-UNIT per tablet Take 1 tablet by mouth 2 (two) times daily.      . cephALEXin (KEFLEX) 500 MG capsule Take 1 capsule (500 mg total) by mouth 3 (three) times daily. 30 capsule 0  . Cholecalciferol (VITAMIN D3) 1000 UNITS tablet Take 1,000 Units by mouth daily.      . citalopram (CELEXA) 40 MG tablet TAKE 1 TABLET (40 MG TOTAL) BY MOUTH DAILY. 90 tablet 0  . cyanocobalamin (,VITAMIN B-12,) 1000 MCG/ML injection Inject 1 mL (1,000 mcg total) into the muscle every 14 (fourteen) days. Please dispense 10 ml vial  to patient. 10 mL 6  . ferrous sulfate 325 (65 FE) MG tablet Take 325 mg by mouth daily with breakfast.      . fish oil-omega-3 fatty acids 1000 MG capsule Take 1 g by mouth daily at 12 noon.     . folic acid (FOLVITE) 1 MG tablet Take 4 tablets (4 mg total) by mouth daily. 120 tablet 0  . furosemide (LASIX) 80 MG tablet Take 1 tablet (80 mg total) by mouth daily. (Patient taking differently: Take 40 mg by mouth daily. ) 90 tablet 3  . HYDROcodone-acetaminophen (NORCO/VICODIN) 5-325 MG tablet Take 1 tablet by mouth 3 (three) times daily. 30 tablet 0  . montelukast (SINGULAIR) 10 MG tablet Take 1 tablet (10 mg total) by mouth at bedtime. 1 tablet 11  . Multiple Vitamin (MULTIVITAMIN) capsule Take 1 capsule by mouth daily.      . mycophenolate (CELLCEPT) 500 MG tablet Take 1 tablet (500 mg total) by mouth daily. 90 tablet 3  . nystatin cream (MYCOSTATIN) APPLY TOPICALLY TWICE DAILY 30 g 0  . pantoprazole (PROTONIX) 40 MG tablet TAKE 1 TABLET (40 MG TOTAL) BY MOUTH DAILY AT 12 NOON. 90 tablet 1  . potassium chloride SA (K-DUR,KLOR-CON) 20 MEQ tablet Take 1 tablet (20 mEq total) by mouth 2 (two) times daily. 180 tablet 3  . predniSONE (DELTASONE) 10 MG tablet Take 1 tablet (10 mg total) by mouth daily. Do not stop until directed 30 tablet 1  . PROAIR HFA 108 (90 BASE) MCG/ACT inhaler Inhale 2 puffs into the lungs every 6 (six) hours as needed for shortness of breath.   11  .  Resource Beneprotein PACK Take 3 each by mouth 3 (three) times daily. Take 3g three times a day    . temazepam (RESTORIL) 30 MG capsule Take 1 capsule (30 mg total) by mouth at bedtime. 30 capsule 5  . apixaban (ELIQUIS) 5 MG TABS tablet Take 2 tablets (10 mg total) by mouth 2 (two) times daily at 10 AM and 5 PM. 3 tablet 0   No current facility-administered medications for this visit.    PHYSICAL EXAMINATION: ECOG PERFORMANCE STATUS: 1 - Symptomatic but completely ambulatory  BP 137/72 mmHg  Pulse 70  Temp(Src) 97.6 F (36.4 C) (Tympanic)  Resp 18  Wt 132 lb 15 oz (60.3 kg)  Filed Weights   09/11/15 1017  Weight: 132 lb 15 oz (60.3 kg)    GENERAL: Frail-appearing Caucasian male patient. He is up and about by himself. Alert, no distress and comfortable.   EYES: no pallor; no jaundice. OROPHARYNX: no thrush or ulceration; good dentition  NECK: supple, no masses felt LYMPH:  no palpable lymphadenopathy in the cervical, axillary or inguinal regions LUNGS: Bilateral coarse breath sounds. No wheeze or crackles HEART/CVS: regular rate & rhythm and no murmurs; No lower extremity edema ABDOMEN:abdomen soft, non-tender and normal bowel sounds Musculoskeletal:no cyanosis of digits and no clubbing  PSYCH: alert & oriented x 3 with fluent speech NEURO: no focal motor/sensory deficits SKIN: Improvement of the erythema of the skin of the right upper extremity.  LABORATORY DATA:  I have reviewed the data as listed    Component Value Date/Time   NA 134* 09/11/2015 1000   NA 140 06/17/2014 0933   NA 139 03/21/2014 1040   K 5.0 09/11/2015 1000   K 4.6 03/21/2014 1040   CL 101 09/11/2015 1000   CL 102 03/21/2014 1040   CO2 29 09/11/2015 1000   CO2 32 03/21/2014  1040   GLUCOSE 114* 09/11/2015 1000   GLUCOSE 75 06/17/2014 0933   GLUCOSE 93 03/21/2014 1040   BUN 21* 09/11/2015 1000   BUN 18 06/17/2014 0933   BUN 20* 03/21/2014 1040   CREATININE 0.80 09/11/2015 1000   CREATININE 1.06  03/21/2014 1040   CALCIUM 9.0 09/11/2015 1000   CALCIUM 9.2 03/21/2014 1040   PROT 5.8* 08/17/2015 0822   PROT 6.3 06/17/2014 0933   PROT 7.0 03/21/2014 1040   ALBUMIN 3.7 08/17/2015 0822   ALBUMIN 4.1 06/17/2014 0933   ALBUMIN 3.7 03/21/2014 1040   AST 39 08/17/2015 0822   AST 36 03/21/2014 1040   ALT 20 08/17/2015 0822   ALT 22 03/21/2014 1040   ALKPHOS 52 08/17/2015 0822   ALKPHOS 92 03/21/2014 1040   BILITOT 2.7* 08/17/2015 0822   BILITOT 0.8 06/17/2014 0933   BILITOT 1.0 03/21/2014 1040   GFRNONAA >60 09/11/2015 1000   GFRNONAA >60 03/21/2014 1040   GFRNONAA >60 09/20/2013 0953   GFRAA >60 09/11/2015 1000   GFRAA >60 03/21/2014 1040   GFRAA >60 09/20/2013 0953    No results found for: SPEP, UPEP  Lab Results  Component Value Date   WBC 14.1* 09/11/2015   NEUTROABS 11.1* 09/11/2015   HGB 11.9* 09/11/2015   HCT 34.9* 09/11/2015   MCV 108.9* 09/11/2015   PLT 346 09/11/2015      Chemistry      Component Value Date/Time   NA 134* 09/11/2015 1000   NA 140 06/17/2014 0933   NA 139 03/21/2014 1040   K 5.0 09/11/2015 1000   K 4.6 03/21/2014 1040   CL 101 09/11/2015 1000   CL 102 03/21/2014 1040   CO2 29 09/11/2015 1000   CO2 32 03/21/2014 1040   BUN 21* 09/11/2015 1000   BUN 18 06/17/2014 0933   BUN 20* 03/21/2014 1040   CREATININE 0.80 09/11/2015 1000   CREATININE 1.06 03/21/2014 1040      Component Value Date/Time   CALCIUM 9.0 09/11/2015 1000   CALCIUM 9.2 03/21/2014 1040   ALKPHOS 52 08/17/2015 0822   ALKPHOS 92 03/21/2014 1040   AST 39 08/17/2015 0822   AST 36 03/21/2014 1040   ALT 20 08/17/2015 0822   ALT 22 03/21/2014 1040   BILITOT 2.7* 08/17/2015 0822   BILITOT 0.8 06/17/2014 0933   BILITOT 1.0 03/21/2014 1040       ASSESSMENT & PLAN:   # Hemolytic anemia- secondary to CLL-Currently  status post 4 treatments of rituximab Approximately 4 weeks ago; Continue prednisone 10 mg. Hemoglobin 11.9. LDH slightly better at 571 from 605. Continue  checking CBC on a weekly basis along with LDH.   # Right upper extremity cellulitis- improved status post Augmentin.  # CLL- Atypical phenotype noted on the peripheral blood flow cytometry; fish negative.   # Osteoporosis/ history of prolonged prednisone use- given the recent fall watch out for fractures.  # Follow-up with me in 3 weeks with CBC BMP and LDH.    Cammie Sickle, MD 09/11/2015 11:19 AM

## 2015-09-17 ENCOUNTER — Telehealth: Payer: Self-pay | Admitting: *Deleted

## 2015-09-17 DIAGNOSIS — L03113 Cellulitis of right upper limb: Secondary | ICD-10-CM

## 2015-09-17 MED ORDER — AMOXICILLIN-POT CLAVULANATE 875-125 MG PO TABS: 1.0000 | ORAL_TABLET | Freq: Two times a day (BID) | ORAL | Status: DC

## 2015-09-17 NOTE — Telephone Encounter (Signed)
Cellulitis is coming back in his right arm, Was given Augmentin a couple weeks ago for the same thing. Please advise

## 2015-09-18 ENCOUNTER — Inpatient Hospital Stay: Payer: Medicare Other

## 2015-09-18 ENCOUNTER — Telehealth: Payer: Self-pay | Admitting: *Deleted

## 2015-09-18 DIAGNOSIS — C9110 Chronic lymphocytic leukemia of B-cell type not having achieved remission: Secondary | ICD-10-CM

## 2015-09-18 LAB — CBC WITH DIFFERENTIAL/PLATELET
BASOS ABS: 0.1 10*3/uL (ref 0–0.1)
Basophils Relative: 0 %
EOS ABS: 0 10*3/uL (ref 0–0.7)
HCT: 37.3 % — ABNORMAL LOW (ref 40.0–52.0)
HEMOGLOBIN: 12.6 g/dL — AB (ref 13.0–18.0)
Lymphocytes Relative: 17 %
Lymphs Abs: 2.3 10*3/uL (ref 1.0–3.6)
MCH: 36.4 pg — AB (ref 26.0–34.0)
MCHC: 33.9 g/dL (ref 32.0–36.0)
MCV: 107.3 fL — AB (ref 80.0–100.0)
Monocytes Absolute: 0.6 10*3/uL (ref 0.2–1.0)
Monocytes Relative: 5 %
Neutro Abs: 10.2 10*3/uL — ABNORMAL HIGH (ref 1.4–6.5)
Platelets: 344 10*3/uL (ref 150–440)
RBC: 3.48 MIL/uL — AB (ref 4.40–5.90)
RDW: 13.1 % (ref 11.5–14.5)
WBC: 13.2 10*3/uL — AB (ref 3.8–10.6)

## 2015-09-18 LAB — LACTATE DEHYDROGENASE: LDH: 525 U/L — ABNORMAL HIGH (ref 98–192)

## 2015-09-18 NOTE — Telephone Encounter (Signed)
-----   Message from Cammie Sickle, MD sent at 09/18/2015 11:54 AM EDT ----- Please inform pt re: labs/look okay; recommend to cut down on prednisone 5mg  once a day [given repeated cellulitis]; repeat labs-cbc/ldh in 1 week; further instructions to follow. Thx

## 2015-09-18 NOTE — Telephone Encounter (Signed)
Called patient to let him know his labs look okay.  MD recommends to cut down on prednisone 5 mg daily. Will repeat labs in one week.  Further instructions at that time.  Patient verbalized understanding.

## 2015-09-25 ENCOUNTER — Ambulatory Visit (INDEPENDENT_AMBULATORY_CARE_PROVIDER_SITE_OTHER): Payer: Medicare Other | Admitting: Cardiovascular Disease

## 2015-09-25 ENCOUNTER — Inpatient Hospital Stay: Payer: Medicare Other

## 2015-09-25 ENCOUNTER — Encounter: Payer: Self-pay | Admitting: Cardiovascular Disease

## 2015-09-25 VITALS — BP 108/52 | HR 52 | Ht 65.0 in | Wt 131.5 lb

## 2015-09-25 DIAGNOSIS — C9110 Chronic lymphocytic leukemia of B-cell type not having achieved remission: Secondary | ICD-10-CM

## 2015-09-25 DIAGNOSIS — I700 Atherosclerosis of aorta: Secondary | ICD-10-CM

## 2015-09-25 DIAGNOSIS — R0600 Dyspnea, unspecified: Secondary | ICD-10-CM

## 2015-09-25 DIAGNOSIS — I272 Other secondary pulmonary hypertension: Secondary | ICD-10-CM

## 2015-09-25 DIAGNOSIS — I5032 Chronic diastolic (congestive) heart failure: Secondary | ICD-10-CM | POA: Diagnosis not present

## 2015-09-25 DIAGNOSIS — I2583 Coronary atherosclerosis due to lipid rich plaque: Secondary | ICD-10-CM

## 2015-09-25 DIAGNOSIS — I2510 Atherosclerotic heart disease of native coronary artery without angina pectoris: Secondary | ICD-10-CM

## 2015-09-25 LAB — CBC WITH DIFFERENTIAL/PLATELET
Basophils Absolute: 0 10*3/uL (ref 0–0.1)
Basophils Relative: 0 %
EOS ABS: 0.4 10*3/uL (ref 0–0.7)
EOS PCT: 3 %
HCT: 34.9 % — ABNORMAL LOW (ref 40.0–52.0)
HEMOGLOBIN: 12.3 g/dL — AB (ref 13.0–18.0)
LYMPHS ABS: 2.8 10*3/uL (ref 1.0–3.6)
Lymphocytes Relative: 22 %
MCH: 36.7 pg — AB (ref 26.0–34.0)
MCHC: 35.2 g/dL (ref 32.0–36.0)
MCV: 104.5 fL — ABNORMAL HIGH (ref 80.0–100.0)
MONOS PCT: 10 %
Monocytes Absolute: 1.3 10*3/uL — ABNORMAL HIGH (ref 0.2–1.0)
NEUTROS PCT: 65 %
Neutro Abs: 8.3 10*3/uL — ABNORMAL HIGH (ref 1.4–6.5)
Platelets: 371 10*3/uL (ref 150–440)
RBC: 3.34 MIL/uL — ABNORMAL LOW (ref 4.40–5.90)
RDW: 13.8 % (ref 11.5–14.5)
WBC: 12.9 10*3/uL — ABNORMAL HIGH (ref 3.8–10.6)

## 2015-09-25 LAB — LACTATE DEHYDROGENASE: LDH: 495 U/L — AB (ref 98–192)

## 2015-09-25 MED ORDER — ATORVASTATIN CALCIUM 10 MG PO TABS: 10.0000 mg | ORAL_TABLET | Freq: Every day | ORAL | Status: AC

## 2015-09-25 NOTE — Patient Instructions (Addendum)
You are doing well.  Please start atorvastatin/lipitor 10 mg daily  If you get ankle swelling, take extra lasix after lunch   Please call us if you have new issues that need to be addressed before your next appt.  Your physician wants you to follow-up in: 6 months.  You will receive a reminder letter in the mail two months in advance. If you don't receive a letter, please call our office to schedule the follow-up appointment.

## 2015-09-25 NOTE — Progress Notes (Signed)
[name], male   DOB: 1929-09-21, 82 y.o.   MRN: [id number] Cardiology Office Note  Date:  09/25/2015   ID:  [name] [name], DOB [birth year], MRN [id number]  PCP:  Otilio Miu, MD   Chief Complaint  Patient presents with  . other    6 month f/u. Meds reviewed verbally with pt.    HPI:  [name] [name] is a 82 y.o. male with prior history of smoking, chronic shortness of breath, fatigue, polymyositis that is under good control on CellCept and prednisone, history of CLL, followed by hematology in [place], presenting for follow up of his SOB CT showing moderate coronary artery disease of all 3 vessels, notably in the proximal LAD, moderate aortic atherosclerosis particular through the aortic arch Previously ordered stress tests on his last clinic visit and 2016, he did not complete the stress test, did not feel is necessary  Recent hospitalization March 2017 for anemia, CLL, bacteremia Hospital records reviewed He presented with shortness of breath, hypoxia 86% on room air, positive blood cultures with coag negative staph. Normal ejection fraction on echocardiogram with moderate to severely elevated right heart pressures, Moderate MR and TR TEE performed showing no endocarditis Discharged on Lasix, Presents today taking Lasix 40 mg daily, reports his weight is stable, no shortness of breath and no leg edema Hematocrit increased from 21 in March 2017, now 35  Feels his breathing is stable Undergoing treatment for CLL  EKG on today's visit shows sinus bradycardia with normal sinus rhythm, rate 52 bpm, right bundle branch block total chol 195, LDL 111 in 05/12/2015  Other past medical history reviewed CT scan of the chest in 2012  showing bronchiectasis, coronary artery disease, atherosclerosis Review of CT scan images with him shows at least moderate coronary artery disease of all 3 vessels, notably in the proximal LAD, moderate aortic atherosclerosis particular through the  aortic arch  Prior echocardiogram reviewed with him showing normal ejection fraction, mild valvular disease, mildly elevated right ventricular systolic pressures  PMH:   has a past medical history of CLL (chronic lymphoblastic leukemia) (2006); Atrial septal aneurysm (2009); Infection of PEG site ([place]); Vertebral compression fracture ([place]); Herpes zoster; Aortic insufficiency (2009); Diastolic dysfunction; Chest pain; Edema; Myositis; Calculus of kidney; Inguinal hernia without mention of obstruction or gangrene, unilateral or unspecified, (not specified as recurrent); Unspecified gastritis and gastroduodenitis without mention of hemorrhage; Abdominal pain, epigastric; Nonspecific elevation of levels of transaminase or lactic acid dehydrogenase (LDH); Feeding difficulties and mismanagement; Edema of male genital organs; Swelling of arm; LFT elevation; Dyslipidemia; Shortness of breath; RBBB (right bundle branch block); Bradycardia; Ejection fraction; Mitral regurgitation; Right ventricular dysfunction; Febrile illness; Polymyositis ([place]) (11/26/2012); Hemolytic anemia ([place]); Cellulitis; and Chronic diastolic CHF (congestive heart failure) ([place]) (03/17/2015).  PSH:    Past Surgical History  Procedure Laterality Date  . Splenectomy    . Hernia repair      left side  . Peg placement  06/2007  . Peg tube removal  12/2007  . Kyphosis surgery      multilevel vertebral  . Odontoid fracture surgery      anterior screw fixation  . Nasal sinus surgery      x2  . Cataract extraction      bi lateral  . Tee without cardioversion N/A 07/01/2015    Procedure: TRANSESOPHAGEAL ECHOCARDIOGRAM (TEE);  Surgeon: Dixie Dials, MD;  Location: [facility] ENDOSCOPY;  Service: Cardiovascular;  Laterality: N/A;    Current Outpatient Prescriptions  Medication Sig Dispense Refill  .  acyclovir (ZOVIRAX) 200 MG capsule Take 1 capsule (200 mg total) by mouth 2 (two) times daily. 60 capsule 12  . albuterol (PROVENTIL) (2.5 MG/3ML)  0.083% nebulizer solution Take 3 mLs (2.5 mg total) by nebulization every 6 (six) hours as needed for wheezing or shortness of breath. 75 mL 12  . ALPRAZolam (XANAX) 0.25 MG tablet Take 1 tablet (0.25 mg total) by mouth at bedtime as needed for anxiety. 30 tablet 0  . amoxicillin-clavulanate (AUGMENTIN) 875-125 MG tablet Take 1 tablet by mouth 2 (two) times daily. 20 tablet 0  . apixaban (ELIQUIS) 5 MG TABS tablet Take 1 tablet (5 mg total) by mouth 2 (two) times daily. 60 tablet 2  . calcium-vitamin D (CALCIUM 500+D) 500-200 MG-UNIT per tablet Take 1 tablet by mouth 2 (two) times daily.      . Cholecalciferol (VITAMIN D3) 1000 UNITS tablet Take 1,000 Units by mouth daily.      . citalopram (CELEXA) 40 MG tablet TAKE 1 TABLET (40 MG TOTAL) BY MOUTH DAILY. 90 tablet 0  . cyanocobalamin (,VITAMIN B-12,) 1000 MCG/ML injection Inject 1 mL (1,000 mcg total) into the muscle every 14 (fourteen) days. Please dispense 10 ml vial to patient. 10 mL 6  . ferrous sulfate 325 (65 FE) MG tablet Take 325 mg by mouth daily with breakfast.      . fish oil-omega-3 fatty acids 1000 MG capsule Take 1 g by mouth daily at 12 noon.     . folic acid (FOLVITE) 1 MG tablet Take 4 tablets (4 mg total) by mouth daily. 120 tablet 0  . furosemide (LASIX) 80 MG tablet Take 1 tablet (80 mg total) by mouth daily. (Patient taking differently: Take 40 mg by mouth daily. ) 90 tablet 3  . HYDROcodone-acetaminophen (NORCO/VICODIN) 5-325 MG tablet Take 1 tablet by mouth 3 (three) times daily. 30 tablet 0  . montelukast (SINGULAIR) 10 MG tablet Take 1 tablet (10 mg total) by mouth at bedtime. 1 tablet 11  . Multiple Vitamin (MULTIVITAMIN) capsule Take 1 capsule by mouth daily.      . mycophenolate (CELLCEPT) 500 MG tablet Take 1 tablet (500 mg total) by mouth daily. 90 tablet 3  . nystatin cream (MYCOSTATIN) APPLY TOPICALLY TWICE DAILY 30 g 0  . pantoprazole (PROTONIX) 40 MG tablet TAKE 1 TABLET (40 MG TOTAL) BY MOUTH DAILY AT 12 NOON. 90  tablet 1  . potassium chloride SA (K-DUR,KLOR-CON) 20 MEQ tablet Take 1 tablet (20 mEq total) by mouth 2 (two) times daily. 180 tablet 3  . predniSONE (DELTASONE) 10 MG tablet Take 1 tablet (10 mg total) by mouth daily. Do not stop until directed 30 tablet 1  . PROAIR HFA 108 (90 BASE) MCG/ACT inhaler Inhale 2 puffs into the lungs every 6 (six) hours as needed for shortness of breath.   11  . Resource Beneprotein PACK Take 3 each by mouth 3 (three) times daily. Take 3g three times a day    . temazepam (RESTORIL) 30 MG capsule Take 1 capsule (30 mg total) by mouth at bedtime. 30 capsule 5  . atorvastatin (LIPITOR) 10 MG tablet Take 1 tablet (10 mg total) by mouth daily. 30 tablet 11   No current facility-administered medications for this visit.     Allergies:   Sulfonamide derivatives   Social History:  The patient  reports that he quit smoking about 47 years ago. His smoking use included Cigarettes. He has a 15.5 pack-year smoking history. He has quit using smokeless tobacco.  His smokeless tobacco use included Chew. He reports that he does not drink alcohol or use illicit drugs.   Family History:   family history includes Breast cancer in his sister; Cancer in his brother, daughter, and sister; Colon cancer in his brother; Diabetes in his grandchild; Emphysema in his father; Hypertension in his mother; Prostate cancer in his brother; Stroke in his mother. There is no history of Heart attack.    Review of Systems: Review of Systems  Constitutional: Negative.   Respiratory: Positive for shortness of breath.   Cardiovascular: Negative.   Gastrointestinal: Negative.   Musculoskeletal: Negative.   Neurological: Negative.   Psychiatric/Behavioral: Negative.   All other systems reviewed and are negative.    PHYSICAL EXAM: VS:  BP 108/52 mmHg  Pulse 52  Ht 5\' 5"  (1.651 m)  Wt 131 lb 8 oz (59.648 kg)  BMI 21.88 kg/m2 , BMI Body mass index is 21.88 kg/(m^2). GEN: no acute distress,  elderly, thin HEENT: normal Neck: no JVD, carotid bruits, or masses Cardiac: RRR; 2+ murmurs, no rubs, or gallops,no edema  Respiratory:  clear to auscultation bilaterally, normal work of breathing GI: soft, nontender, nondistended, + BS MS:  + kyphosis Skin: warm and dry, no rash Neuro:  Strength and sensation are intact Psych: euthymic mood, full affect    Recent Labs: 04/23/2015: Magnesium 2.0 06/28/2015: B Natriuretic Peptide 121.4*; TSH 1.574 08/17/2015: ALT 20 09/11/2015: BUN 21*; Creatinine, Ser 0.80; Potassium 5.0; Sodium 134* 09/25/2015: Hemoglobin 12.3*; Platelets 371    Lipid Panel Lab Results  Component Value Date   CHOL 195 05/11/2015   HDL 53 05/11/2015   LDLCALC 111* 05/11/2015   TRIG 154* 05/11/2015      Wt Readings from Last 3 Encounters:  09/25/15 131 lb 8 oz (59.648 kg)  09/11/15 132 lb 15 oz (60.3 kg)  09/01/15 127 lb 3.3 oz (57.7 kg)       ASSESSMENT AND PLAN:  Chronic diastolic CHF (congestive heart failure) ([place]) - Plan: EKG 12-Lead Recommended he stay on his Lasix 40 mg daily Symptoms likely improved as anemia has resolved from prior hospitalization March 2017 Recommended he take Lasix 40 mg twice a day for any ankle swelling  Coronary artery disease due to lipid rich plaque - Plan: EKG 12-Lead Did not show up for stress test, he does not want to reorder Denies any symptoms concerning for angina Recommended he start Lipitor 10 mg daily to reach goal LDL less than 70  Aortic atherosclerosis ([place]) Start statin as above  CLL (chronic lymphocytic leukemia) ([place]) Anemia much improved on current treatment Breathing improved  Dyspnea Previous symptoms of shortness of breath likely secondary to anemia notable in March 2017 secondary to CLL. Also with diastolic CHF, symptoms improved with Lasix daily   Disposition:   F/U  6 months   Orders Placed This Encounter  Procedures  . EKG 12-Lead     Total encounter time more than 25 minutes   Greater than 50% was spent in counseling and coordination of care with the patient  Signed, Esmond Plants, M.D., Ph.D. 09/25/2015  [place], [place]

## 2015-09-28 ENCOUNTER — Telehealth: Payer: Self-pay | Admitting: *Deleted

## 2015-09-28 NOTE — Telephone Encounter (Signed)
-----   Message from Cammie Sickle, MD sent at 09/27/2015  7:35 PM EDT ----- Please inform pt to taper the steroids further to prednisone 5 mg a day [give new script if needed]; continue until further directed; also continue weekly cbc/ldh- Thx

## 2015-09-28 NOTE — Telephone Encounter (Signed)
Called patient to inform him to taper prednisone to 5 mg daily.  Patient states he is already @ 5 mg daily.  Does not need new prescription.  Patient will follow up with labs on Friday.

## 2015-09-29 ENCOUNTER — Inpatient Hospital Stay: Payer: Medicare Other | Admitting: Family Medicine

## 2015-09-29 ENCOUNTER — Telehealth: Payer: Self-pay | Admitting: *Deleted

## 2015-09-29 DIAGNOSIS — L03113 Cellulitis of right upper limb: Secondary | ICD-10-CM

## 2015-09-29 NOTE — Telephone Encounter (Signed)
Called to report that he has completed Augmentin and his right arm cellulitis is getting worse again, asking we send refill of abx to [facility]. Please advise

## 2015-09-29 NOTE — Telephone Encounter (Signed)
Referral written/ Order sent to cancer center scheduling to set up apt with Dr. Ola Spurr.

## 2015-09-29 NOTE — Telephone Encounter (Signed)
I spoke with daughter Juliann Pulse who will attempt to reach patient regarding appt at 2 PM

## 2015-09-29 NOTE — Telephone Encounter (Signed)
Please inform pt -Pt needs to see one of the NPs today; evaluate his arm/cellulitis before prescribing the abx again; and also recommend ID evaluation/referal. Thx

## 2015-09-30 ENCOUNTER — Inpatient Hospital Stay (HOSPITAL_BASED_OUTPATIENT_CLINIC_OR_DEPARTMENT_OTHER): Payer: Medicare Other | Admitting: Family Medicine

## 2015-09-30 ENCOUNTER — Other Ambulatory Visit: Payer: Self-pay | Admitting: Family Medicine

## 2015-09-30 ENCOUNTER — Ambulatory Visit
Admission: RE | Admit: 2015-09-30 | Discharge: 2015-09-30 | Disposition: A | Discharge status: Home / Self Care | Payer: Medicare Other | Source: Ambulatory Visit | Attending: Family Medicine | Admitting: Family Medicine

## 2015-09-30 ENCOUNTER — Telehealth: Payer: Self-pay | Admitting: Family Medicine

## 2015-09-30 VITALS — BP 125/65 | HR 57 | Temp 96.9°F | Wt 129.4 lb

## 2015-09-30 DIAGNOSIS — M25421 Effusion, right elbow: Secondary | ICD-10-CM | POA: Diagnosis present

## 2015-09-30 DIAGNOSIS — R001 Bradycardia, unspecified: Secondary | ICD-10-CM

## 2015-09-30 DIAGNOSIS — C9110 Chronic lymphocytic leukemia of B-cell type not having achieved remission: Secondary | ICD-10-CM | POA: Diagnosis not present

## 2015-09-30 DIAGNOSIS — Z8673 Personal history of transient ischemic attack (TIA), and cerebral infarction without residual deficits: Secondary | ICD-10-CM

## 2015-09-30 DIAGNOSIS — I5032 Chronic diastolic (congestive) heart failure: Secondary | ICD-10-CM

## 2015-09-30 DIAGNOSIS — R740 Nonspecific elevation of levels of transaminase and lactic acid dehydrogenase [LDH]: Secondary | ICD-10-CM

## 2015-09-30 DIAGNOSIS — I771 Stricture of artery: Secondary | ICD-10-CM

## 2015-09-30 DIAGNOSIS — L03113 Cellulitis of right upper limb: Secondary | ICD-10-CM | POA: Insufficient documentation

## 2015-09-30 DIAGNOSIS — E785 Hyperlipidemia, unspecified: Secondary | ICD-10-CM

## 2015-09-30 DIAGNOSIS — Z801 Family history of malignant neoplasm of trachea, bronchus and lung: Secondary | ICD-10-CM

## 2015-09-30 DIAGNOSIS — I340 Nonrheumatic mitral (valve) insufficiency: Secondary | ICD-10-CM

## 2015-09-30 DIAGNOSIS — Z87442 Personal history of urinary calculi: Secondary | ICD-10-CM

## 2015-09-30 DIAGNOSIS — M818 Other osteoporosis without current pathological fracture: Secondary | ICD-10-CM | POA: Diagnosis not present

## 2015-09-30 DIAGNOSIS — R609 Edema, unspecified: Secondary | ICD-10-CM

## 2015-09-30 DIAGNOSIS — Z8781 Personal history of (healed) traumatic fracture: Secondary | ICD-10-CM

## 2015-09-30 DIAGNOSIS — Z87891 Personal history of nicotine dependence: Secondary | ICD-10-CM

## 2015-09-30 DIAGNOSIS — I4510 Unspecified right bundle-branch block: Secondary | ICD-10-CM

## 2015-09-30 DIAGNOSIS — R109 Unspecified abdominal pain: Secondary | ICD-10-CM

## 2015-09-30 DIAGNOSIS — M25521 Pain in right elbow: Secondary | ICD-10-CM | POA: Insufficient documentation

## 2015-09-30 DIAGNOSIS — Z809 Family history of malignant neoplasm, unspecified: Secondary | ICD-10-CM

## 2015-09-30 DIAGNOSIS — Z79899 Other long term (current) drug therapy: Secondary | ICD-10-CM

## 2015-09-30 DIAGNOSIS — Z8719 Personal history of other diseases of the digestive system: Secondary | ICD-10-CM

## 2015-09-30 DIAGNOSIS — D589 Hereditary hemolytic anemia, unspecified: Secondary | ICD-10-CM

## 2015-09-30 DIAGNOSIS — Z803 Family history of malignant neoplasm of breast: Secondary | ICD-10-CM

## 2015-09-30 DIAGNOSIS — K449 Diaphragmatic hernia without obstruction or gangrene: Secondary | ICD-10-CM

## 2015-09-30 DIAGNOSIS — L02413 Cutaneous abscess of right upper limb: Secondary | ICD-10-CM | POA: Diagnosis not present

## 2015-09-30 DIAGNOSIS — M609 Myositis, unspecified: Secondary | ICD-10-CM

## 2015-09-30 DIAGNOSIS — Z8042 Family history of malignant neoplasm of prostate: Secondary | ICD-10-CM

## 2015-09-30 MED ORDER — AMOXICILLIN-POT CLAVULANATE 875-125 MG PO TABS: 1.0000 | ORAL_TABLET | Freq: Two times a day (BID) | ORAL | Status: DC

## 2015-09-30 NOTE — Telephone Encounter (Signed)
Discussed ultrasound images with Dr. Jamal Collin and he recommended that patient be seen by orthopedics in order to properly drain what appears to be a abscess of the bursa. Appointment was made with [facility] with Linton Rump, [place]. Appointment is tomorrow morning 10/01/2015 at 1020. Patient was called and informed of test results and appointment. He is to keep his follow-up appointment with Dr. Rogue Bussing on Friday.

## 2015-09-30 NOTE — Progress Notes (Signed)
[facility]  [phone]  [phone]     [name] [name] DOB: [birth year]  MR#: [id number]  [id number]  Patient Care Team: Juline Patch, MD as PCP - General (Family Medicine) Kathrynn Ducking, MD (Neurology) Forest Gleason, MD as Consulting Physician (Unknown Physician Specialty) Carlena Bjornstad, MD as Consulting Physician (Cardiology) Seeplaputhur Robinette Haines, MD (General Surgery) Minna Merritts, MD as Consulting Physician (Cardiology)  CHIEF COMPLAINT:  Chief Complaint  Patient presents with  . Follow-up    INTERVAL HISTORY: Patient is here as an acute add on regarding the persistent swelling and redness of the right upper extremity. He recently completed a 10 day course of Augmentin prescribed by Dr. Rogue Bussing for cellulitis. Patient reports that once he completed the 10 day course his redness reappeared within about 24-48 hours. He now has swelling in the right posterior aspect of the elbow as well as the upper arm and forearm. He does not complain of any pain but does report that his elbow joint is stiff. Denies any fever, chills.  REVIEW OF SYSTEMS:   Review of Systems  Constitutional: Negative for fever, chills, weight loss, malaise/fatigue and diaphoresis.  HENT: Negative.   Eyes: Negative.   Respiratory: Negative for cough, hemoptysis, sputum production, shortness of breath and wheezing.   Cardiovascular: Negative for chest pain, palpitations, orthopnea, claudication, leg swelling and PND.  Gastrointestinal: Negative for heartburn, nausea, vomiting, abdominal pain, diarrhea, constipation, blood in stool and melena.  Genitourinary: Negative.   Musculoskeletal: Positive for joint pain.       Right elbow with fluid build-up and pain  Skin:       Redness over her right forearm extending into the elbow and upper extremity  Neurological: Negative for dizziness, tingling, focal weakness, seizures and weakness.  Endo/Heme/Allergies: Does not  bruise/bleed easily.  Psychiatric/Behavioral: Negative for depression. The patient is not nervous/anxious and does not have insomnia.     As per HPI. Otherwise, a complete review of systems is negatve.  ONCOLOGY HISTORY: Oncology History   Chief Complaint/Problem List  1.  Autoimmune hemolytic anemia. Previously received Prednisone taper.   2.  Splenectomy in 5/02.  3.  Gastric ulcer diagnosed on endoscopy and multiple biopsies are pending, November 2005. 4.  Chronic lymphocytic leukemia. 5.vitamin B 12 deficiency 6.  Recent admission in the hospital in June of 2016 with hemolytic anemia and hemoglobin of 5.5 patient received blood transfusion was started on steroid 7.  Patient was started on rituximab (October 20, 2014     Chronic lymphocytic leukemia ([place])   08/01/2008 Initial Diagnosis Chronic lymphocytic leukemia    PAST MEDICAL HISTORY: Past Medical History  Diagnosis Date  . CLL (chronic lymphoblastic leukemia) 2006    Rituxan treatment 2006  . Atrial septal aneurysm 2009    Insignificant, no, January, 2009  . Infection of PEG site ([place])     cellulitis.Marland KitchenResolved  . Vertebral compression fracture ([facility])     multiple levels  . Herpes zoster     right chest  . Aortic insufficiency 2009    mild, Mild, echo, 2012  . Diastolic dysfunction     mild  . Chest pain     EF 55%, echo, October, 2009, possible inferior and posterior borderline hypokinesis, patient has never  had cardiac catheterization  . Edema     EF 55%, echo, October, 2009, possible borderline inferior and posterior hypokinesis... heart catheterization has never been done(July 10, 2009)  . Myositis     Caused  pulmonary insufficiency with muscle weakness in the past / Dr.Willis-neurology, Imuran and prednisone  . Calculus of kidney   . Inguinal hernia without mention of obstruction or gangrene, unilateral or unspecified, (not specified as recurrent)     right   . Unspecified gastritis and gastroduodenitis without  mention of hemorrhage   . Abdominal pain, epigastric   . Nonspecific elevation of levels of transaminase or lactic acid dehydrogenase (LDH)   . Feeding difficulties and mismanagement   . Edema of male genital organs     Resolved, occurred during illness related to myositis  . Swelling of arm     left.Marland KitchenMarland KitchenResolved.... no DVT  . LFT elevation     Related to Imuran in the past... dose was adjusted  . Dyslipidemia     statins not used due to LFT abnormalities  . Shortness of breath     Breathing abnormalities related to muscle weakness from myositis  . RBBB (right bundle branch block)     Old  . Bradycardia     Sinus bradycardia, old  . Ejection fraction     EF 60%, echo, October, 2012  . Mitral regurgitation     Mild / moderate, echo, 2012  . Right ventricular dysfunction     Mild, echo, 2012  . Febrile illness     May, 2013, question sinusitis  . Polymyositis ([place]) 11/26/2012  . Hemolytic anemia ([place])   . Cellulitis   . Chronic diastolic CHF (congestive heart failure) () 03/17/2015    PAST SURGICAL HISTORY: Past Surgical History  Procedure Laterality Date  . Splenectomy    . Hernia repair      left side  . Peg placement  06/2007  . Peg tube removal  12/2007  . Kyphosis surgery      multilevel vertebral  . Odontoid fracture surgery      anterior screw fixation  . Nasal sinus surgery      x2  . Cataract extraction      bi lateral  . Tee without cardioversion N/A 07/01/2015    Procedure: TRANSESOPHAGEAL ECHOCARDIOGRAM (TEE);  Surgeon: Dixie Dials, MD;  [facility] END[facility]COPY;  Service: Cardiovascular;  Laterality: N/A;    FAMILY HISTORY Family History  Problem Relation Age of Onset  . Breast cancer Sister   . Colon cancer Brother   . Prostate cancer Brother   . Stroke Mother   . Hypertension Mother   . Emphysema Father   . Diabetes Grandchild   . Heart attack Neg Hx   . Cancer Brother   . Cancer Sister   . Cancer Daughter     GYNECOLOGIC HISTORY:  No LMP  for male patient.     ADVANCED DIRECTIVES:    HEALTH MAINTENANCE: Social History  Substance Use Topics  . Smoking status: Former Smoker -- 0.50 packs/day for 31 years    Types: Cigarettes    Quit date: 06/29/1968  . Smokeless tobacco: Former Systems developer    Types: Chew  . Alcohol Use: No     Comment: rarely     Allergies  Allergen Reactions  . Sulfonamide Derivatives Itching and Rash    Current Outpatient Prescriptions  Medication Sig Dispense Refill  . acyclovir (ZOVIRAX) 200 MG capsule Take 1 capsule (200 mg total) by mouth 2 (two) times daily. 60 capsule 12  . albuterol (PROVENTIL) (2.5 MG/3ML) 0.083% nebulizer solution Take 3 mLs (2.5 mg total) by nebulization every 6 (six) hours as needed for wheezing or shortness of breath. 75 mL 12  .  ALPRAZolam (XANAX) 0.25 MG tablet Take 1 tablet (0.25 mg total) by mouth at bedtime as needed for anxiety. 30 tablet 0  . apixaban (ELIQUIS) 5 MG TABS tablet Take 1 tablet (5 mg total) by mouth 2 (two) times daily. 60 tablet 2  . atorvastatin (LIPITOR) 10 MG tablet Take 1 tablet (10 mg total) by mouth daily. 30 tablet 11  . calcium-vitamin D (CALCIUM 500+D) 500-200 MG-UNIT per tablet Take 1 tablet by mouth 2 (two) times daily.      . Cholecalciferol (VITAMIN D3) 1000 UNITS tablet Take 1,000 Units by mouth daily.      . citalopram (CELEXA) 40 MG tablet TAKE 1 TABLET (40 MG TOTAL) BY MOUTH DAILY. 90 tablet 0  . cyanocobalamin (,VITAMIN B-12,) 1000 MCG/ML injection Inject 1 mL (1,000 mcg total) into the muscle every 14 (fourteen) days. Please dispense 10 ml vial to patient. 10 mL 6  . ferrous sulfate 325 (65 FE) MG tablet Take 325 mg by mouth daily with breakfast.      . fish oil-omega-3 fatty acids 1000 MG capsule Take 1 g by mouth daily at 12 noon.     . folic acid (FOLVITE) 1 MG tablet Take 4 tablets (4 mg total) by mouth daily. 120 tablet 0  . furosemide (LASIX) 80 MG tablet Take 1 tablet (80 mg total) by mouth daily. (Patient taking differently:  Take 40 mg by mouth daily. ) 90 tablet 3  . HYDROcodone-acetaminophen (NORCO/VICODIN) 5-325 MG tablet Take 1 tablet by mouth 3 (three) times daily. 30 tablet 0  . montelukast (SINGULAIR) 10 MG tablet Take 1 tablet (10 mg total) by mouth at bedtime. 1 tablet 11  . Multiple Vitamin (MULTIVITAMIN) capsule Take 1 capsule by mouth daily.      . mycophenolate (CELLCEPT) 500 MG tablet Take 1 tablet (500 mg total) by mouth daily. 90 tablet 3  . nystatin cream (MYCOSTATIN) APPLY TOPICALLY TWICE DAILY 30 g 0  . pantoprazole (PROTONIX) 40 MG tablet TAKE 1 TABLET (40 MG TOTAL) BY MOUTH DAILY AT 12 NOON. 90 tablet 1  . potassium chloride SA (K-DUR,KLOR-CON) 20 MEQ tablet Take 1 tablet (20 mEq total) by mouth 2 (two) times daily. 180 tablet 3  . predniSONE (DELTASONE) 10 MG tablet Take 1 tablet (10 mg total) by mouth daily. Do not stop until directed 30 tablet 1  . PROAIR HFA 108 (90 BASE) MCG/ACT inhaler Inhale 2 puffs into the lungs every 6 (six) hours as needed for shortness of breath.   11  . Resource Beneprotein PACK Take 3 each by mouth 3 (three) times daily. Take 3g three times a day    . temazepam (RESTORIL) 30 MG capsule Take 1 capsule (30 mg total) by mouth at bedtime. 30 capsule 5   No current facility-administered medications for this visit.    OBJECTIVE: BP 125/65 mmHg  Pulse 57  Temp(Src) 96.9 F (36.1 C) (Tympanic)  Wt 129 lb 6.4 oz (58.695 kg)   Body mass index is 21.53 kg/(m^2).    ECOG FS:1 - Symptomatic but completely ambulatory  General: Well-developed, well-nourished, no acute distress. Eyes: Pink conjunctiva, anicteric sclera. HEENT: Normocephalic, moist mucous membranes, clear oropharnyx. Musculoskeletal: Pain edema around the posterior aspect of the right elbow.  Neuro: Alert, answering all questions appropriately. Cranial nerves grossly intact. Skin: Slight erythema noted on the anterior aspect of the forearm and around to the posterior aspect of the elbow.  Psych: Normal  affect.   LAB RESULTS:  No visits with results  within 3 Day(s) from this visit. Latest known visit with results is:  Appointment on 09/25/2015  Component Date Value Ref Range Status  . WBC 09/25/2015 12.9* 3.8 - 10.6 K/uL Final  . RBC 09/25/2015 3.34* 4.40 - 5.90 MIL/uL Final  . Hemoglobin 09/25/2015 12.3* 13.0 - 18.0 g/dL Final  . HCT 09/25/2015 34.9* 40.0 - 52.0 % Final  . MCV 09/25/2015 104.5* 80.0 - 100.0 fL Final  . MCH 09/25/2015 36.7* 26.0 - 34.0 pg Final  . MCHC 09/25/2015 35.2  32.0 - 36.0 g/dL Final  . RDW 09/25/2015 13.8  11.5 - 14.5 % Final  . Platelets 09/25/2015 371  150 - 440 K/uL Final  . Neutrophils Relative % 09/25/2015 65   Final  . Neutro Abs 09/25/2015 8.3* 1.4 - 6.5 K/uL Final  . Lymphocytes Relative 09/25/2015 22   Final  . Lymphs Abs 09/25/2015 2.8  1.0 - 3.6 K/uL Final  . Monocytes Relative 09/25/2015 10   Final  . Monocytes Absolute 09/25/2015 1.3* 0.2 - 1.0 K/uL Final  . Eosinophils Relative 09/25/2015 3   Final  . Eosinophils Absolute 09/25/2015 0.4  0 - 0.7 K/uL Final  . Basophils Relative 09/25/2015 0   Final  . Basophils Absolute 09/25/2015 0.0  0 - 0.1 K/uL Final  . LDH 09/25/2015 495* 98 - 192 U/L Final    STUDIES: No results found.  ASSESSMENT:  Cellulitis with edema in the right elbow.  PLAN:   1. Cellulitis. Patient was previously treated with 10 day course of Augmentin for cellulitis in the right upper extremity. After completion of 10 days of antibiotics he noted that within 48 hours and redness and edema returned. Since patient for an ultrasound of the right upper extremity soft tissue and noted to have marketed subcutaneous soft tissue swelling/edema/fluid consistent with cellulitis. Also noted a large complex multiseptated fluid collection in the posterior medial aspect of the elbow that measures 3.8 x 2.0 x 3.7 cm and is suspicious for abscess.  Have contacted Dr. Angie Fava office for appointment regarding drainage of abscess. Have  also sent a 10 day supply of Augmentin for patient to continue to [facility] drug.  Patient has his next follow-up appointment on Friday the 23rd with Dr. Rogue Bussing.  Patient expressed understanding and was in agreement with this plan. He also understands that He can call clinic at any time with any questions, concerns, or complaints.   Dr. Rogue Bussing was available for consultation and review of plan of care for this patient.   Evlyn Kanner, NP   09/30/2015 10:51 AM

## 2015-09-30 NOTE — Progress Notes (Signed)
Patient ambulates without assistance, brought to exam room 18.  Patient c/o right arm pain 3 oof 10.  Vitals documented, medication record updated, information provided by patient.  Dr. Ree Kida

## 2015-10-02 ENCOUNTER — Inpatient Hospital Stay (HOSPITAL_BASED_OUTPATIENT_CLINIC_OR_DEPARTMENT_OTHER): Payer: Medicare Other | Admitting: Internal Medicine

## 2015-10-02 ENCOUNTER — Inpatient Hospital Stay: Payer: Medicare Other

## 2015-10-02 DIAGNOSIS — C9110 Chronic lymphocytic leukemia of B-cell type not having achieved remission: Secondary | ICD-10-CM

## 2015-10-02 DIAGNOSIS — Z87891 Personal history of nicotine dependence: Secondary | ICD-10-CM

## 2015-10-02 DIAGNOSIS — Z122 Encounter for screening for malignant neoplasm of respiratory organs: Secondary | ICD-10-CM

## 2015-10-02 LAB — CBC WITH DIFFERENTIAL/PLATELET
BASOS ABS: 0.1 10*3/uL (ref 0–0.1)
BLASTS: 0 %
Band Neutrophils: 0 %
Basophils Relative: 1 %
Eosinophils Absolute: 0.3 10*3/uL (ref 0–0.7)
Eosinophils Relative: 3 %
HEMATOCRIT: 36.4 % — AB (ref 40.0–52.0)
HEMOGLOBIN: 12.6 g/dL — AB (ref 13.0–18.0)
LYMPHS ABS: 2.4 10*3/uL (ref 1.0–3.6)
Lymphocytes Relative: 21 %
MCH: 36.1 pg — AB (ref 26.0–34.0)
MCHC: 34.7 g/dL (ref 32.0–36.0)
MCV: 104.1 fL — AB (ref 80.0–100.0)
METAMYELOCYTES PCT: 0 %
MYELOCYTES: 0 %
Monocytes Absolute: 1.6 10*3/uL — ABNORMAL HIGH (ref 0.2–1.0)
Monocytes Relative: 14 %
NEUTROS PCT: 61 %
Neutro Abs: 7.2 10*3/uL — ABNORMAL HIGH (ref 1.4–6.5)
Other: 0 %
PROMYELOCYTES ABS: 0 %
Platelets: 362 10*3/uL (ref 150–440)
RBC: 3.5 MIL/uL — AB (ref 4.40–5.90)
RDW: 13.3 % (ref 11.5–14.5)
WBC: 11.6 10*3/uL — AB (ref 3.8–10.6)
nRBC: 0 /100 WBC

## 2015-10-02 LAB — LACTATE DEHYDROGENASE: LDH: 481 U/L — AB (ref 98–192)

## 2015-10-09 ENCOUNTER — Inpatient Hospital Stay: Payer: Medicare Other

## 2015-10-09 DIAGNOSIS — C9110 Chronic lymphocytic leukemia of B-cell type not having achieved remission: Secondary | ICD-10-CM | POA: Diagnosis not present

## 2015-10-09 LAB — COMPREHENSIVE METABOLIC PANEL
ALBUMIN: 3.7 g/dL (ref 3.5–5.0)
ALK PHOS: 80 U/L (ref 38–126)
ALT: 15 U/L — ABNORMAL LOW (ref 17–63)
ANION GAP: 6 (ref 5–15)
AST: 39 U/L (ref 15–41)
BUN: 16 mg/dL (ref 6–20)
CALCIUM: 9 mg/dL (ref 8.9–10.3)
CO2: 28 mmol/L (ref 22–32)
Chloride: 102 mmol/L (ref 101–111)
Creatinine, Ser: 0.8 mg/dL (ref 0.61–1.24)
GFR calc Af Amer: >60 mL/min (ref >60)
GFR calc non Af Amer: >60 mL/min (ref >60)
GLUCOSE: 114 mg/dL — AB (ref 65–99)
Potassium: 3.9 mmol/L (ref 3.5–5.1)
SODIUM: 136 mmol/L (ref 135–145)
Total Bilirubin: 1.8 mg/dL — ABNORMAL HIGH (ref 0.3–1.2)
Total Protein: 6.1 g/dL — ABNORMAL LOW (ref 6.5–8.1)

## 2015-10-09 LAB — CBC WITH DIFFERENTIAL/PLATELET
BASOS ABS: 0.3 10*3/uL — AB (ref 0–0.1)
EOS ABS: 0.8 10*3/uL — AB (ref 0–0.7)
Eosinophils Relative: 6 %
HCT: 36.7 % — ABNORMAL LOW (ref 40.0–52.0)
HEMOGLOBIN: 12.6 g/dL — AB (ref 13.0–18.0)
Lymphocytes Relative: 43 %
Lymphs Abs: 5.6 10*3/uL — ABNORMAL HIGH (ref 1.0–3.6)
MCH: 34.7 pg — ABNORMAL HIGH (ref 26.0–34.0)
MCHC: 34.3 g/dL (ref 32.0–36.0)
MCV: 101.3 fL — ABNORMAL HIGH (ref 80.0–100.0)
Monocytes Absolute: 2 10*3/uL — ABNORMAL HIGH (ref 0.2–1.0)
NEUTROS ABS: 4 10*3/uL (ref 1.4–6.5)
Platelets: 381 10*3/uL (ref 150–440)
RBC: 3.63 MIL/uL — AB (ref 4.40–5.90)
RDW: 12.9 % (ref 11.5–14.5)
WBC: 12.7 10*3/uL — AB (ref 3.8–10.6)

## 2015-10-09 LAB — LACTATE DEHYDROGENASE: LDH: 519 U/L — ABNORMAL HIGH (ref 98–192)

## 2015-10-14 ENCOUNTER — Other Ambulatory Visit: Payer: Self-pay | Admitting: Family Medicine

## 2015-10-15 ENCOUNTER — Other Ambulatory Visit: Payer: Self-pay

## 2015-10-16 ENCOUNTER — Inpatient Hospital Stay: Payer: Medicare Other | Attending: Internal Medicine

## 2015-10-16 ENCOUNTER — Other Ambulatory Visit: Payer: Self-pay | Admitting: Family Medicine

## 2015-10-16 ENCOUNTER — Other Ambulatory Visit: Payer: Self-pay

## 2015-10-16 DIAGNOSIS — L03113 Cellulitis of right upper limb: Secondary | ICD-10-CM | POA: Insufficient documentation

## 2015-10-16 DIAGNOSIS — R0602 Shortness of breath: Secondary | ICD-10-CM | POA: Insufficient documentation

## 2015-10-16 DIAGNOSIS — E785 Hyperlipidemia, unspecified: Secondary | ICD-10-CM | POA: Insufficient documentation

## 2015-10-16 DIAGNOSIS — M7989 Other specified soft tissue disorders: Secondary | ICD-10-CM | POA: Diagnosis not present

## 2015-10-16 DIAGNOSIS — Z9081 Acquired absence of spleen: Secondary | ICD-10-CM | POA: Diagnosis not present

## 2015-10-16 DIAGNOSIS — Z8041 Family history of malignant neoplasm of ovary: Secondary | ICD-10-CM | POA: Diagnosis not present

## 2015-10-16 DIAGNOSIS — Z8711 Personal history of peptic ulcer disease: Secondary | ICD-10-CM | POA: Insufficient documentation

## 2015-10-16 DIAGNOSIS — Z800 Family history of malignant neoplasm of digestive organs: Secondary | ICD-10-CM | POA: Diagnosis not present

## 2015-10-16 DIAGNOSIS — I4510 Unspecified right bundle-branch block: Secondary | ICD-10-CM | POA: Insufficient documentation

## 2015-10-16 DIAGNOSIS — I5032 Chronic diastolic (congestive) heart failure: Secondary | ICD-10-CM | POA: Diagnosis not present

## 2015-10-16 DIAGNOSIS — E538 Deficiency of other specified B group vitamins: Secondary | ICD-10-CM | POA: Insufficient documentation

## 2015-10-16 DIAGNOSIS — Z803 Family history of malignant neoplasm of breast: Secondary | ICD-10-CM | POA: Insufficient documentation

## 2015-10-16 DIAGNOSIS — I340 Nonrheumatic mitral (valve) insufficiency: Secondary | ICD-10-CM | POA: Insufficient documentation

## 2015-10-16 DIAGNOSIS — Z87891 Personal history of nicotine dependence: Secondary | ICD-10-CM | POA: Diagnosis not present

## 2015-10-16 DIAGNOSIS — C9110 Chronic lymphocytic leukemia of B-cell type not having achieved remission: Secondary | ICD-10-CM | POA: Diagnosis not present

## 2015-10-16 DIAGNOSIS — Z79899 Other long term (current) drug therapy: Secondary | ICD-10-CM | POA: Insufficient documentation

## 2015-10-16 DIAGNOSIS — I253 Aneurysm of heart: Secondary | ICD-10-CM | POA: Diagnosis not present

## 2015-10-16 DIAGNOSIS — Z809 Family history of malignant neoplasm, unspecified: Secondary | ICD-10-CM | POA: Diagnosis not present

## 2015-10-16 DIAGNOSIS — D591 Other autoimmune hemolytic anemias: Secondary | ICD-10-CM | POA: Diagnosis not present

## 2015-10-16 DIAGNOSIS — Z7901 Long term (current) use of anticoagulants: Secondary | ICD-10-CM | POA: Diagnosis not present

## 2015-10-16 DIAGNOSIS — Z87442 Personal history of urinary calculi: Secondary | ICD-10-CM | POA: Insufficient documentation

## 2015-10-16 LAB — CBC WITH DIFFERENTIAL/PLATELET
BASOS ABS: 0 10*3/uL (ref 0–0.1)
BASOS PCT: 0 %
EOS PCT: 5 %
Eosinophils Absolute: 0.7 10*3/uL (ref 0–0.7)
HEMATOCRIT: 37.4 % — AB (ref 40.0–52.0)
Hemoglobin: 13 g/dL (ref 13.0–18.0)
Lymphocytes Relative: 49 %
Lymphs Abs: 7.3 10*3/uL — ABNORMAL HIGH (ref 1.0–3.6)
MCH: 34.8 pg — AB (ref 26.0–34.0)
MCHC: 34.7 g/dL (ref 32.0–36.0)
MCV: 100.2 fL — AB (ref 80.0–100.0)
MONO ABS: 2.1 10*3/uL — AB (ref 0.2–1.0)
MONOS PCT: 14 %
NRBC: 2 /100{WBCs} — AB
Neutro Abs: 4.8 10*3/uL (ref 1.4–6.5)
Neutrophils Relative %: 32 %
PLATELETS: 422 10*3/uL (ref 150–440)
RBC: 3.73 MIL/uL — ABNORMAL LOW (ref 4.40–5.90)
RDW: 14.1 % (ref 11.5–14.5)
WBC: 14.9 10*3/uL — ABNORMAL HIGH (ref 3.8–10.6)

## 2015-10-16 LAB — COMPREHENSIVE METABOLIC PANEL
ALT: 15 U/L — AB (ref 17–63)
AST: 40 U/L (ref 15–41)
Albumin: 3.9 g/dL (ref 3.5–5.0)
Alkaline Phosphatase: 76 U/L (ref 38–126)
Anion gap: 4 — ABNORMAL LOW (ref 5–15)
BILIRUBIN TOTAL: 1.3 mg/dL — AB (ref 0.3–1.2)
BUN: 14 mg/dL (ref 6–20)
CHLORIDE: 104 mmol/L (ref 101–111)
CO2: 28 mmol/L (ref 22–32)
CREATININE: 0.78 mg/dL (ref 0.61–1.24)
Calcium: 9.1 mg/dL (ref 8.9–10.3)
Glucose, Bld: 106 mg/dL — ABNORMAL HIGH (ref 65–99)
Potassium: 4.2 mmol/L (ref 3.5–5.1)
Sodium: 136 mmol/L (ref 135–145)
TOTAL PROTEIN: 6.2 g/dL — AB (ref 6.5–8.1)

## 2015-10-16 LAB — LACTATE DEHYDROGENASE: LDH: 491 U/L — AB (ref 98–192)

## 2015-10-23 ENCOUNTER — Inpatient Hospital Stay (HOSPITAL_BASED_OUTPATIENT_CLINIC_OR_DEPARTMENT_OTHER): Payer: Medicare Other | Admitting: Internal Medicine

## 2015-10-23 ENCOUNTER — Inpatient Hospital Stay: Payer: Medicare Other

## 2015-10-23 VITALS — BP 137/70 | HR 69 | Temp 97.0°F | Resp 18 | Wt 129.4 lb

## 2015-10-23 DIAGNOSIS — C9110 Chronic lymphocytic leukemia of B-cell type not having achieved remission: Secondary | ICD-10-CM

## 2015-10-23 DIAGNOSIS — I50810 Right heart failure, unspecified: Secondary | ICD-10-CM

## 2015-10-23 DIAGNOSIS — Z7901 Long term (current) use of anticoagulants: Secondary | ICD-10-CM

## 2015-10-23 DIAGNOSIS — I340 Nonrheumatic mitral (valve) insufficiency: Secondary | ICD-10-CM

## 2015-10-23 DIAGNOSIS — Z87891 Personal history of nicotine dependence: Secondary | ICD-10-CM

## 2015-10-23 DIAGNOSIS — R0602 Shortness of breath: Secondary | ICD-10-CM

## 2015-10-23 DIAGNOSIS — D591 Autoimmune hemolytic anemia, unspecified: Secondary | ICD-10-CM

## 2015-10-23 DIAGNOSIS — L03113 Cellulitis of right upper limb: Secondary | ICD-10-CM | POA: Diagnosis not present

## 2015-10-23 DIAGNOSIS — Z79899 Other long term (current) drug therapy: Secondary | ICD-10-CM

## 2015-10-23 DIAGNOSIS — Z8711 Personal history of peptic ulcer disease: Secondary | ICD-10-CM

## 2015-10-23 DIAGNOSIS — D538 Other specified nutritional anemias: Secondary | ICD-10-CM | POA: Diagnosis not present

## 2015-10-23 DIAGNOSIS — Z87442 Personal history of urinary calculi: Secondary | ICD-10-CM

## 2015-10-23 DIAGNOSIS — I4510 Unspecified right bundle-branch block: Secondary | ICD-10-CM

## 2015-10-23 DIAGNOSIS — Z803 Family history of malignant neoplasm of breast: Secondary | ICD-10-CM

## 2015-10-23 DIAGNOSIS — M7989 Other specified soft tissue disorders: Secondary | ICD-10-CM

## 2015-10-23 DIAGNOSIS — Z9081 Acquired absence of spleen: Secondary | ICD-10-CM

## 2015-10-23 DIAGNOSIS — I253 Aneurysm of heart: Secondary | ICD-10-CM

## 2015-10-23 DIAGNOSIS — E785 Hyperlipidemia, unspecified: Secondary | ICD-10-CM

## 2015-10-23 DIAGNOSIS — I351 Nonrheumatic aortic (valve) insufficiency: Secondary | ICD-10-CM

## 2015-10-23 DIAGNOSIS — I5032 Chronic diastolic (congestive) heart failure: Secondary | ICD-10-CM

## 2015-10-23 LAB — COMPREHENSIVE METABOLIC PANEL
ALBUMIN: 4.2 g/dL (ref 3.5–5.0)
ALT: 15 U/L — ABNORMAL LOW (ref 17–63)
AST: 42 U/L — AB (ref 15–41)
Alkaline Phosphatase: 70 U/L (ref 38–126)
Anion gap: 6 (ref 5–15)
BUN: 17 mg/dL (ref 6–20)
CHLORIDE: 103 mmol/L (ref 101–111)
CO2: 27 mmol/L (ref 22–32)
Calcium: 9.2 mg/dL (ref 8.9–10.3)
Creatinine, Ser: 0.85 mg/dL (ref 0.61–1.24)
GFR calc Af Amer: >60 mL/min (ref >60)
GFR calc non Af Amer: >60 mL/min (ref >60)
GLUCOSE: 123 mg/dL — AB (ref 65–99)
POTASSIUM: 4.8 mmol/L (ref 3.5–5.1)
SODIUM: 136 mmol/L (ref 135–145)
Total Bilirubin: 1.6 mg/dL — ABNORMAL HIGH (ref 0.3–1.2)
Total Protein: 6.6 g/dL (ref 6.5–8.1)

## 2015-10-23 LAB — CBC WITH DIFFERENTIAL/PLATELET
BASOS PCT: 3 %
Basophils Absolute: 0.3 10*3/uL — ABNORMAL HIGH (ref 0–0.1)
EOS ABS: 0.1 10*3/uL (ref 0–0.7)
EOS PCT: 1 %
HCT: 38.1 % — ABNORMAL LOW (ref 40.0–52.0)
Hemoglobin: 13.1 g/dL (ref 13.0–18.0)
Lymphocytes Relative: 40 %
Lymphs Abs: 4.1 10*3/uL — ABNORMAL HIGH (ref 1.0–3.6)
MCH: 34.1 pg — AB (ref 26.0–34.0)
MCHC: 34.5 g/dL (ref 32.0–36.0)
MCV: 98.8 fL (ref 80.0–100.0)
MONOS PCT: 10 %
Monocytes Absolute: 1 10*3/uL (ref 0.2–1.0)
NEUTROS PCT: 46 %
Neutro Abs: 4.9 10*3/uL (ref 1.4–6.5)
PLATELETS: 331 10*3/uL (ref 150–440)
RBC: 3.86 MIL/uL — ABNORMAL LOW (ref 4.40–5.90)
RDW: 14 % (ref 11.5–14.5)
WBC: 10.3 10*3/uL (ref 3.8–10.6)

## 2015-10-23 LAB — LACTATE DEHYDROGENASE: LDH: 528 U/L — AB (ref 98–192)

## 2015-10-23 NOTE — Progress Notes (Signed)
[place] OFFICE PROGRESS NOTE  Patient Care Team: Juline Patch, MD as PCP - General (Family Medicine) Kathrynn Ducking, MD (Neurology) Forest Gleason, MD as Consulting Physician (Unknown Physician Specialty) Carlena Bjornstad, MD as Consulting Physician (Cardiology) Seeplaputhur Robinette Haines, MD (General Surgery) Minna Merritts, MD as Consulting Physician (Cardiology)   SUMMARY OF ONCOLOGIC HISTORY:  Oncology History   Chief Complaint/Problem List  1.  Autoimmune hemolytic anemia. Previously received Prednisone taper.   2.  Splenectomy in 5/02.  3.  Gastric ulcer diagnosed on endoscopy and multiple biopsies are pending, November 2005. 4.  Chronic lymphocytic leukemia. 5.vitamin B 12 deficiency 6.  Recent admission in the hospital in June of 2016 with hemolytic anemia and hemoglobin of 5.5 patient received blood transfusion was started on steroid 7.  Patient was started on rituximab (October 20, 2014     Chronic lymphocytic leukemia ([facility])   08/01/2008 Initial Diagnosis Chronic lymphocytic leukemia      INTERVAL HISTORY:  82 year old male patient with above history of CLL/history of hemolytic anemia-  The most recent relapse in March 2017 is currently on prednisone Prednisone 5 mg twice weekly.  Finished Rituxan approximately 6 weeks ago.   In the interim patient had drainage of inflamed bursa of his right elbow. Is currently off antibiotics..    Patient denies any worsening shortness of breath. Denies abdominal pain. Denies any skin yellowing or rash. Denies any difficulty swallowing. No fevers or chills.    REVIEW OF SYSTEMS:  A complete 10 point review of system is done which is negative except mentioned above/history of present illness.   PAST MEDICAL HISTORY :  Past Medical History  Diagnosis Date  . CLL (chronic lymphoblastic leukemia) 2006    Rituxan treatment 2006  . Atrial septal aneurysm 2009    Insignificant, no, January, 2009  . Infection of PEG site  ([place])     cellulitis.Marland KitchenResolved  . Vertebral compression fracture (HCC)     multiple levels  . Herpes zoster     right chest  . Aortic insufficiency 2009    mild, Mild, echo, 2012  . Diastolic dysfunction     mild  . Chest pain     EF 55%, echo, October, 2009, possible inferior and posterior borderline hypokinesis, patient has never  had cardiac catheterization  . Edema     EF 55%, echo, October, 2009, possible borderline inferior and posterior hypokinesis... heart catheterization has never been done(July 10, 2009)  . Myositis     Caused pulmonary insufficiency with muscle weakness in the past / Dr.Willis-neurology, Imuran and prednisone  . Calculus of kidney   . Inguinal hernia without mention of obstruction or gangrene, unilateral or unspecified, (not specified as recurrent)     right   . Unspecified gastritis and gastroduodenitis without mention of hemorrhage   . Abdominal pain, epigastric   . Nonspecific elevation of levels of transaminase or lactic acid dehydrogenase (LDH)   . Feeding difficulties and mismanagement   . Edema of male genital organs     Resolved, occurred during illness related to myositis  . Swelling of arm     left.Marland KitchenMarland KitchenResolved.... no DVT  . LFT elevation     Related to Imuran in the past... dose was adjusted  . Dyslipidemia     statins not used due to LFT abnormalities  . Shortness of breath     Breathing abnormalities related to muscle weakness from myositis  . RBBB (right bundle branch block)  Old  . Bradycardia     Sinus bradycardia, old  . Ejection fraction     EF 60%, echo, October, 2012  . Mitral regurgitation     Mild / moderate, echo, 2012  . Right ventricular dysfunction     Mild, echo, 2012  . Febrile illness     May, 2013, question sinusitis  . Polymyositis ([place]) 11/26/2012  . Hemolytic anemia ([place])   . Cellulitis   . Chronic diastolic CHF (congestive heart failure) ([place]) 03/17/2015    PAST SURGICAL HISTORY :   Past Surgical History   Procedure Laterality Date  . Splenectomy    . Hernia repair      left side  . Peg placement  06/2007  . Peg tube removal  12/2007  . Kyphosis surgery      multilevel vertebral  . Odontoid fracture surgery      anterior screw fixation  . Nasal sinus surgery      x2  . Cataract extraction      bi lateral  . Tee without cardioversion N/A 07/01/2015    Procedure: TRANSESOPHAGEAL ECHOCARDIOGRAM (TEE);  Surgeon: Dixie Dials, MD;  Location: [facility] ENDOSCOPY;  Service: Cardiovascular;  Laterality: N/A;    FAMILY HISTORY :   Family History  Problem Relation Age of Onset  . Breast cancer Sister   . Colon cancer Brother   . Prostate cancer Brother   . Stroke Mother   . Hypertension Mother   . Emphysema Father   . Diabetes Grandchild   . Heart attack Neg Hx   . Cancer Brother   . Cancer Sister   . Cancer Daughter     SOCIAL HISTORY:   Social History  Substance Use Topics  . Smoking status: Former Smoker -- 0.50 packs/day for 31 years    Types: Cigarettes    Quit date: 06/29/1968  . Smokeless tobacco: Former Systems developer    Types: Chew  . Alcohol Use: No     Comment: rarely    ALLERGIES:  is allergic to sulfonamide derivatives.  MEDICATIONS:  Current Outpatient Prescriptions  Medication Sig Dispense Refill  . acyclovir (ZOVIRAX) 200 MG capsule Take 1 capsule (200 mg total) by mouth 2 (two) times daily. 60 capsule 12  . albuterol (PROVENTIL) (2.5 MG/3ML) 0.083% nebulizer solution Take 3 mLs (2.5 mg total) by nebulization every 6 (six) hours as needed for wheezing or shortness of breath. 75 mL 12  . ALPRAZolam (XANAX) 0.25 MG tablet Take 1 tablet (0.25 mg total) by mouth at bedtime as needed for anxiety. 30 tablet 0  . amoxicillin-clavulanate (AUGMENTIN) 875-125 MG tablet Take 1 tablet by mouth 2 (two) times daily. 20 tablet 0  . apixaban (ELIQUIS) 5 MG TABS tablet Take 1 tablet (5 mg total) by mouth 2 (two) times daily. 60 tablet 2  . atorvastatin (LIPITOR) 10 MG tablet Take 1 tablet  (10 mg total) by mouth daily. 30 tablet 11  . calcium-vitamin D (CALCIUM 500+D) 500-200 MG-UNIT per tablet Take 1 tablet by mouth 2 (two) times daily.      . Cholecalciferol (VITAMIN D3) 1000 UNITS tablet Take 1,000 Units by mouth daily.      . citalopram (CELEXA) 40 MG tablet TAKE 1 TABLET (40 MG TOTAL) BY MOUTH DAILY. 90 tablet 0  . cyanocobalamin (,VITAMIN B-12,) 1000 MCG/ML injection Inject 1 mL (1,000 mcg total) into the muscle every 14 (fourteen) days. Please dispense 10 ml vial to patient. 10 mL 6  . ferrous sulfate 325 (65 FE)  MG tablet Take 325 mg by mouth daily with breakfast.      . fish oil-omega-3 fatty acids 1000 MG capsule Take 1 g by mouth daily at 12 noon.     . folic acid (FOLVITE) 1 MG tablet Take 4 tablets (4 mg total) by mouth daily. 120 tablet 0  . furosemide (LASIX) 80 MG tablet Take 1 tablet (80 mg total) by mouth daily. (Patient taking differently: Take 40 mg by mouth daily. ) 90 tablet 3  . HYDROcodone-acetaminophen (NORCO/VICODIN) 5-325 MG tablet Take 1 tablet by mouth 3 (three) times daily. 30 tablet 0  . montelukast (SINGULAIR) 10 MG tablet TAKE ONE TABLET BY MOUTH EVERY DAY 90 tablet 0  . Multiple Vitamin (MULTIVITAMIN) capsule Take 1 capsule by mouth daily.      . mycophenolate (CELLCEPT) 500 MG tablet Take 1 tablet (500 mg total) by mouth daily. 90 tablet 3  . nystatin cream (MYCOSTATIN) APPLY TOPICALLY TWICE DAILY 30 g 0  . pantoprazole (PROTONIX) 40 MG tablet TAKE 1 TABLET (40 MG TOTAL) BY MOUTH DAILY AT 12 NOON. 90 tablet 1  . potassium chloride SA (K-DUR,KLOR-CON) 20 MEQ tablet Take 1 tablet (20 mEq total) by mouth 2 (two) times daily. 180 tablet 3  . predniSONE (DELTASONE) 10 MG tablet Take 1 tablet (10 mg total) by mouth daily. Do not stop until directed 30 tablet 1  . PROAIR HFA 108 (90 BASE) MCG/ACT inhaler Inhale 2 puffs into the lungs every 6 (six) hours as needed for shortness of breath.   11  . Resource Beneprotein PACK Take 3 each by mouth 3 (three)  times daily. Take 3g three times a day    . temazepam (RESTORIL) 30 MG capsule Take 1 capsule (30 mg total) by mouth at bedtime. 30 capsule 5   No current facility-administered medications for this visit.    PHYSICAL EXAMINATION: ECOG PERFORMANCE STATUS: 1 - Symptomatic but completely ambulatory  BP 137/70 mmHg  Pulse 69  Temp(Src) 97 F (36.1 C) (Tympanic)  Resp 18  Wt 129 lb 6.6 oz (58.7 kg)  Filed Weights   10/23/15 1211  Weight: 129 lb 6.6 oz (58.7 kg)    GENERAL: Frail-appearing Caucasian male patient. He is up and about by himself. Alert, no distress and comfortable.   EYES: no pallor; no jaundice. OROPHARYNX: no thrush or ulceration; good dentition  NECK: supple, no masses felt LYMPH:  no palpable lymphadenopathy in the cervical, axillary or inguinal regions LUNGS: Bilateral coarse breath sounds. No wheeze or crackles HEART/CVS: regular rate & rhythm and no murmurs; No lower extremity edema ABDOMEN:abdomen soft, non-tender and normal bowel sounds Musculoskeletal:no cyanosis of digits and no clubbing  PSYCH: alert & oriented x 3 with fluent speech NEURO: no focal motor/sensory deficits SKIN: ICystic component noted in the right elbow.   LABORATORY DATA:  I have reviewed the data as listed    Component Value Date/Time   NA 136 10/23/2015 1140   NA 140 06/17/2014 0933   NA 139 03/21/2014 1040   K 4.8 10/23/2015 1140   K 4.6 03/21/2014 1040   CL 103 10/23/2015 1140   CL 102 03/21/2014 1040   CO2 27 10/23/2015 1140   CO2 32 03/21/2014 1040   GLUCOSE 123* 10/23/2015 1140   GLUCOSE 75 06/17/2014 0933   GLUCOSE 93 03/21/2014 1040   BUN 17 10/23/2015 1140   BUN 18 06/17/2014 0933   BUN 20* 03/21/2014 1040   CREATININE 0.85 10/23/2015 1140   CREATININE 1.06 03/21/2014  1040   CALCIUM 9.2 10/23/2015 1140   CALCIUM 9.2 03/21/2014 1040   PROT 6.6 10/23/2015 1140   PROT 6.3 06/17/2014 0933   PROT 7.0 03/21/2014 1040   ALBUMIN 4.2 10/23/2015 1140   ALBUMIN 4.1  06/17/2014 0933   ALBUMIN 3.7 03/21/2014 1040   AST 42* 10/23/2015 1140   AST 36 03/21/2014 1040   ALT 15* 10/23/2015 1140   ALT 22 03/21/2014 1040   ALKPHOS 70 10/23/2015 1140   ALKPHOS 92 03/21/2014 1040   BILITOT 1.6* 10/23/2015 1140   BILITOT 0.8 06/17/2014 0933   BILITOT 1.0 03/21/2014 1040   GFRNONAA >60 10/23/2015 1140   GFRNONAA >60 03/21/2014 1040   GFRNONAA >60 09/20/2013 0953   GFRAA >60 10/23/2015 1140   GFRAA >60 03/21/2014 1040   GFRAA >60 09/20/2013 0953    No results found for: SPEP, UPEP  Lab Results  Component Value Date   WBC 10.3 10/23/2015   NEUTROABS 4.9 10/23/2015   HGB 13.1 10/23/2015   HCT 38.1* 10/23/2015   MCV 98.8 10/23/2015   PLT 331 10/23/2015      Chemistry      Component Value Date/Time   NA 136 10/23/2015 1140   NA 140 06/17/2014 0933   NA 139 03/21/2014 1040   K 4.8 10/23/2015 1140   K 4.6 03/21/2014 1040   CL 103 10/23/2015 1140   CL 102 03/21/2014 1040   CO2 27 10/23/2015 1140   CO2 32 03/21/2014 1040   BUN 17 10/23/2015 1140   BUN 18 06/17/2014 0933   BUN 20* 03/21/2014 1040   CREATININE 0.85 10/23/2015 1140   CREATININE 1.06 03/21/2014 1040      Component Value Date/Time   CALCIUM 9.2 10/23/2015 1140   CALCIUM 9.2 03/21/2014 1040   ALKPHOS 70 10/23/2015 1140   ALKPHOS 92 03/21/2014 1040   AST 42* 10/23/2015 1140   AST 36 03/21/2014 1040   ALT 15* 10/23/2015 1140   ALT 22 03/21/2014 1040   BILITOT 1.6* 10/23/2015 1140   BILITOT 0.8 06/17/2014 0933   BILITOT 1.0 03/21/2014 1040       ASSESSMENT & PLAN:   Chronic lymphocytic leukemia ([employer]) # Hemolytic anemia- secondary to CLL-Currently  status post 4 treatments of rituximab approximately 2 months ago. Patient's hemoglobin is 13/white count stable platelets normal. Patient prednisone 5 mg twice week; I recommend stopping the prednisone  # Right upper extremity cellulitis/bursitis status post drainage improved. Evaluated by ID. Not on antibiotics   # CLL-  Atypical phenotype noted on the peripheral blood flow cytometry; fish negative.    #For now check CBC LDH on a weekly basis; follow-up with me in 5 weeks with labs.   # 15 minutes face-to-face with the patient discussing the above plan of care; more than 50% of time spent on natural history; counseling and coordination.    Cammie Sickle, MD 10/23/2015 5:45 PM

## 2015-10-23 NOTE — Progress Notes (Signed)
Patient states he feels fatigued.  Gets SOB when he is up walking around or doing much.

## 2015-10-23 NOTE — Assessment & Plan Note (Signed)
#   Hemolytic anemia- secondary to CLL-Currently  status post 4 treatments of rituximab approximately 2 months ago. Patient's hemoglobin is 13/white count stable platelets normal. Patient prednisone 5 mg twice week; I recommend stopping the prednisone  # Right upper extremity cellulitis/bursitis status post drainage improved. Evaluated by ID. Not on antibiotics   # CLL- Atypical phenotype noted on the peripheral blood flow cytometry; fish negative.    #For now check CBC LDH on a weekly basis; follow-up with me in 5 weeks with labs.

## 2015-10-30 ENCOUNTER — Inpatient Hospital Stay: Payer: Medicare Other | Attending: Internal Medicine

## 2015-10-30 DIAGNOSIS — C9110 Chronic lymphocytic leukemia of B-cell type not having achieved remission: Secondary | ICD-10-CM | POA: Insufficient documentation

## 2015-10-30 LAB — CBC WITH DIFFERENTIAL/PLATELET
BASOS PCT: 0 %
Basophils Absolute: 0 10*3/uL (ref 0–0.1)
Eosinophils Absolute: 0.8 10*3/uL — ABNORMAL HIGH (ref 0–0.7)
Eosinophils Relative: 7 %
HCT: 36.8 % — ABNORMAL LOW (ref 40.0–52.0)
Hemoglobin: 12.6 g/dL — ABNORMAL LOW (ref 13.0–18.0)
Lymphocytes Relative: 54 %
Lymphs Abs: 5.7 10*3/uL — ABNORMAL HIGH (ref 1.0–3.6)
MCH: 33.9 pg (ref 26.0–34.0)
MCHC: 34.3 g/dL (ref 32.0–36.0)
MCV: 98.8 fL (ref 80.0–100.0)
MONO ABS: 2.1 10*3/uL — AB (ref 0.2–1.0)
MONOS PCT: 19 %
NEUTROS PCT: 20 %
Neutro Abs: 2.2 10*3/uL (ref 1.4–6.5)
Platelets: 309 10*3/uL (ref 150–440)
RBC: 3.73 MIL/uL — ABNORMAL LOW (ref 4.40–5.90)
RDW: 13.6 % (ref 11.5–14.5)
WBC: 10.7 10*3/uL — ABNORMAL HIGH (ref 3.8–10.6)

## 2015-10-30 LAB — COMPREHENSIVE METABOLIC PANEL
ALBUMIN: 3.9 g/dL (ref 3.5–5.0)
ALT: 14 U/L — ABNORMAL LOW (ref 17–63)
ANION GAP: 4 — AB (ref 5–15)
AST: 36 U/L (ref 15–41)
Alkaline Phosphatase: 66 U/L (ref 38–126)
BUN: 13 mg/dL (ref 6–20)
CO2: 31 mmol/L (ref 22–32)
Calcium: 9.2 mg/dL (ref 8.9–10.3)
Chloride: 104 mmol/L (ref 101–111)
Creatinine, Ser: 0.9 mg/dL (ref 0.61–1.24)
GFR calc Af Amer: >60 mL/min (ref >60)
GFR calc non Af Amer: >60 mL/min (ref >60)
GLUCOSE: 101 mg/dL — AB (ref 65–99)
POTASSIUM: 4.1 mmol/L (ref 3.5–5.1)
SODIUM: 139 mmol/L (ref 135–145)
Total Bilirubin: 1.2 mg/dL (ref 0.3–1.2)
Total Protein: 6.1 g/dL — ABNORMAL LOW (ref 6.5–8.1)

## 2015-10-30 LAB — LACTATE DEHYDROGENASE: LDH: 463 U/L — ABNORMAL HIGH (ref 98–192)

## 2015-11-04 NOTE — Progress Notes (Signed)
Called patient and informed him that his labs are stable. He should stop steroids and follow up as planned.  Verbalized understanding.

## 2015-11-06 ENCOUNTER — Inpatient Hospital Stay: Payer: Medicare Other

## 2015-11-06 ENCOUNTER — Telehealth: Payer: Self-pay | Admitting: Internal Medicine

## 2015-11-06 DIAGNOSIS — C9110 Chronic lymphocytic leukemia of B-cell type not having achieved remission: Secondary | ICD-10-CM | POA: Diagnosis not present

## 2015-11-06 LAB — CBC WITH DIFFERENTIAL/PLATELET
BASOS ABS: 0.1 10*3/uL (ref 0–0.1)
EOS ABS: 0.5 10*3/uL (ref 0–0.7)
Eosinophils Relative: 5 %
HEMATOCRIT: 39.6 % — AB (ref 40.0–52.0)
HEMOGLOBIN: 13.5 g/dL (ref 13.0–18.0)
Lymphocytes Relative: 49 %
Lymphs Abs: 4.8 10*3/uL — ABNORMAL HIGH (ref 1.0–3.6)
MCH: 33.3 pg (ref 26.0–34.0)
MCHC: 34 g/dL (ref 32.0–36.0)
MCV: 98.1 fL (ref 80.0–100.0)
Monocytes Absolute: 1.9 10*3/uL — ABNORMAL HIGH (ref 0.2–1.0)
Monocytes Relative: 19 %
NEUTROS ABS: 2.4 10*3/uL (ref 1.4–6.5)
Platelets: 288 10*3/uL (ref 150–440)
RBC: 4.04 MIL/uL — AB (ref 4.40–5.90)
RDW: 13.9 % (ref 11.5–14.5)
WBC: 9.7 10*3/uL (ref 3.8–10.6)

## 2015-11-06 LAB — COMPREHENSIVE METABOLIC PANEL
ALBUMIN: 4.2 g/dL (ref 3.5–5.0)
ALK PHOS: 69 U/L (ref 38–126)
ALT: 14 U/L — ABNORMAL LOW (ref 17–63)
AST: 36 U/L (ref 15–41)
Anion gap: 6 (ref 5–15)
BUN: 14 mg/dL (ref 6–20)
CALCIUM: 9.3 mg/dL (ref 8.9–10.3)
CHLORIDE: 103 mmol/L (ref 101–111)
CO2: 30 mmol/L (ref 22–32)
CREATININE: 0.84 mg/dL (ref 0.61–1.24)
GFR calc Af Amer: >60 mL/min (ref >60)
GFR calc non Af Amer: >60 mL/min (ref >60)
Glucose, Bld: 100 mg/dL — ABNORMAL HIGH (ref 65–99)
Potassium: 4.2 mmol/L (ref 3.5–5.1)
SODIUM: 139 mmol/L (ref 135–145)
TOTAL PROTEIN: 6 g/dL — AB (ref 6.5–8.1)
Total Bilirubin: 1.4 mg/dL — ABNORMAL HIGH (ref 0.3–1.2)

## 2015-11-06 LAB — LACTATE DEHYDROGENASE: LDH: 455 U/L — AB (ref 98–192)

## 2015-11-06 NOTE — Telephone Encounter (Signed)
I tried to reach pt re: labs/ did not pick. Labs look good. Continue off steroids- labs as planned. Please inform pt. Dr.B

## 2015-11-09 ENCOUNTER — Ambulatory Visit (INDEPENDENT_AMBULATORY_CARE_PROVIDER_SITE_OTHER): Payer: Medicare Other | Admitting: Family Medicine

## 2015-11-09 ENCOUNTER — Encounter: Payer: Self-pay | Admitting: Family Medicine

## 2015-11-09 VITALS — BP 120/60 | HR 68 | Ht 65.0 in | Wt 129.0 lb

## 2015-11-09 DIAGNOSIS — G4700 Insomnia, unspecified: Secondary | ICD-10-CM | POA: Diagnosis not present

## 2015-11-09 DIAGNOSIS — IMO0001 Reserved for inherently not codable concepts without codable children: Secondary | ICD-10-CM

## 2015-11-09 DIAGNOSIS — M5137 Other intervertebral disc degeneration, lumbosacral region: Secondary | ICD-10-CM | POA: Diagnosis not present

## 2015-11-09 DIAGNOSIS — M3320 Polymyositis, organ involvement unspecified: Secondary | ICD-10-CM

## 2015-11-09 DIAGNOSIS — F329 Major depressive disorder, single episode, unspecified: Secondary | ICD-10-CM

## 2015-11-09 DIAGNOSIS — M4850XG Collapsed vertebra, not elsewhere classified, site unspecified, subsequent encounter for fracture with delayed healing: Secondary | ICD-10-CM

## 2015-11-09 DIAGNOSIS — F32A Depression, unspecified: Secondary | ICD-10-CM

## 2015-11-09 DIAGNOSIS — J301 Allergic rhinitis due to pollen: Secondary | ICD-10-CM

## 2015-11-09 DIAGNOSIS — M51379 Other intervertebral disc degeneration, lumbosacral region without mention of lumbar back pain or lower extremity pain: Secondary | ICD-10-CM

## 2015-11-09 MED ORDER — HYDROCODONE-ACETAMINOPHEN 5-325 MG PO TABS: 1.0000 | ORAL_TABLET | Freq: Three times a day (TID) | ORAL | 0 refills | Status: DC

## 2015-11-09 MED ORDER — TEMAZEPAM 15 MG PO CAPS: 15.0000 mg | ORAL_CAPSULE | Freq: Every evening | ORAL | 5 refills | Status: AC | PRN

## 2015-11-09 MED ORDER — MONTELUKAST SODIUM 10 MG PO TABS: 10.0000 mg | ORAL_TABLET | Freq: Every day | ORAL | 1 refills | Status: AC

## 2015-11-09 MED ORDER — CITALOPRAM HYDROBROMIDE 40 MG PO TABS: 40.0000 mg | ORAL_TABLET | Freq: Every day | ORAL | 1 refills | Status: AC

## 2015-11-09 NOTE — Progress Notes (Signed)
Name: [name] [name]   MRN: [id number]    DOB: 1930/01/17   Date:11/09/2015       Progress Note  Subjective  Chief Complaint  Chief Complaint  Patient presents with  . Back Pain  . Allergic Rhinitis   . Insomnia    wants to change to 15mg  on Temazepam  . Depression    Back Pain  This is a chronic problem. The current episode started more than 1 year ago. The problem occurs constantly. The problem has been waxing and waning since onset. The pain is present in the lumbar spine. The quality of the pain is described as aching. The pain is moderate. Stiffness is present in the morning. Pertinent negatives include no abdominal pain, chest pain, dysuria, fever, headaches, tingling or weight loss. The treatment provided mild relief.  Insomnia  Primary symptoms: no malaise/fatigue.  The onset quality is undetermined. The problem occurs nightly. The problem has been gradually worsening since onset. The treatment provided mild relief. How long after going to bed to you fall asleep: 15-30 minutes.   PMH includes: depression.  Depression       (Difficulty awakening)  This is a chronic problem.  The current episode started more than 1 year ago.   The onset quality is gradual.   The problem occurs intermittently.  Associated symptoms include insomnia.  Associated symptoms include no hopelessness, not irritable, no myalgias, no headaches, not sad and no suicidal ideas.  Compliance with treatment is good.  Previous treatment provided moderate relief.   No problem-specific Assessment & Plan notes found for this encounter.   Past Medical History:  Diagnosis Date  . Abdominal pain, epigastric   . Aortic insufficiency 2009   mild, Mild, echo, 2012  . Atrial septal aneurysm 2009   Insignificant, no, January, 2009  . Bradycardia    Sinus bradycardia, old  . Calculus of kidney   . Cellulitis   . Chest pain    EF 55%, echo, October, 2009, possible inferior and posterior borderline hypokinesis, patient has  never  had cardiac catheterization  . Chronic diastolic CHF (congestive heart failure) ([place]) 03/17/2015  . CLL (chronic lymphoblastic leukemia) 2006   Rituxan treatment 2006  . Diastolic dysfunction    mild  . Dyslipidemia    statins not used due to LFT abnormalities  . Edema    EF 55%, echo, October, 2009, possible borderline inferior and posterior hypokinesis... heart catheterization has never been done(July 10, 2009)  . Edema of male genital organs    Resolved, occurred during illness related to myositis  . Ejection fraction    EF 60%, echo, October, 2012  . Febrile illness    May, 2013, question sinusitis  . Feeding difficulties and mismanagement   . Hemolytic anemia ([place])   . Herpes zoster    right chest  . Infection of PEG site (HCC)    cellulitis.Marland KitchenResolved  . Inguinal hernia without mention of obstruction or gangrene, unilateral or unspecified, (not specified as recurrent)    right   . LFT elevation    Related to Imuran in the past... dose was adjusted  . Mitral regurgitation    Mild / moderate, echo, 2012  . Myositis    Caused pulmonary insufficiency with muscle weakness in the past / Dr.Willis-neurology, Imuran and prednisone  . Nonspecific elevation of levels of transaminase or lactic acid dehydrogenase (LDH)   . Polymyositis ([place]) 11/26/2012  . RBBB (right bundle branch block)    Old  . Right ventricular  dysfunction    Mild, echo, 2012  . Shortness of breath    Breathing abnormalities related to muscle weakness from myositis  . Swelling of arm    left.Marland KitchenMarland KitchenResolved.... no DVT  . Unspecified gastritis and gastroduodenitis without mention of hemorrhage   . Vertebral compression fracture (HCC)    multiple levels    Past Surgical History:  Procedure Laterality Date  . CATARACT EXTRACTION     bi lateral  . HERNIA REPAIR     left side  . KYPHOSIS SURGERY     multilevel vertebral  . NASAL SINUS SURGERY     x2  . ODONTOID FRACTURE SURGERY     anterior screw  fixation  . PEG PLACEMENT  06/2007  . PEG TUBE REMOVAL  12/2007  . SPLENECTOMY    . TEE WITHOUT CARDIOVERSION N/A 07/01/2015   Procedure: TRANSESOPHAGEAL ECHOCARDIOGRAM (TEE);  Surgeon: Dixie Dials, MD;  Location: [facility] ENDOSCOPY;  Service: Cardiovascular;  Laterality: N/A;    Family History  Problem Relation Age of Onset  . Stroke Mother   . Hypertension Mother   . Emphysema Father   . Breast cancer Sister   . Colon cancer Brother   . Prostate cancer Brother   . Diabetes Grandchild   . Cancer Brother   . Cancer Sister   . Cancer Daughter   . Heart attack Neg Hx     Social History   Social History  . Marital status: Widowed    Spouse name: N/A  . Number of children: 4  . Years of education: college   Occupational History  . retired    Social History Main Topics  . Smoking status: Former Smoker    Packs/day: 0.50    Years: 31.00    Types: Cigarettes    Quit date: 06/29/1968  . Smokeless tobacco: Former Systems developer    Types: Chew  . Alcohol use No     Comment: rarely  . Drug use: No  . Sexual activity: No   Other Topics Concern  . Not on file   Social History Narrative   Patient is widowed and lives alone.   Patient is retired.   Patient has four adult children.   Patient has a college degree.   Patient is right-handed.   Patient drinks one cup of coffee daily.    Allergies  Allergen Reactions  . Sulfonamide Derivatives Itching and Rash     Review of Systems  Constitutional: Negative for chills, fever, malaise/fatigue and weight loss.  HENT: Negative for ear discharge, ear pain and sore throat.   Eyes: Negative for blurred vision.  Respiratory: Negative for cough, sputum production, shortness of breath and wheezing.   Cardiovascular: Negative for chest pain, palpitations and leg swelling.  Gastrointestinal: Negative for abdominal pain, blood in stool, constipation, diarrhea, heartburn, melena and nausea.  Genitourinary: Negative for dysuria, frequency,  hematuria and urgency.  Musculoskeletal: Positive for back pain. Negative for joint pain, myalgias and neck pain.  Skin: Negative for rash.  Neurological: Negative for dizziness, tingling, sensory change, focal weakness and headaches.  Endo/Heme/Allergies: Negative for environmental allergies and polydipsia. Does not bruise/bleed easily.  Psychiatric/Behavioral: Positive for depression. Negative for suicidal ideas. The patient has insomnia. The patient is not nervous/anxious.      Objective  Vitals:   11/09/15 0906  BP: 120/60  Pulse: 68  Weight: 129 lb (58.5 kg)  Height: 5\' 5"  (1.651 m)    Physical Exam  Constitutional: He is oriented to person, place, and time  and well-developed, well-nourished, and in no distress. He is not irritable.  HENT:  Head: Normocephalic.  Right Ear: External ear normal.  Left Ear: External ear normal.  Nose: Nose normal.  Mouth/Throat: Oropharynx is clear and moist.  Eyes: Conjunctivae and EOM are normal. Pupils are equal, round, and reactive to light. Right eye exhibits no discharge. Left eye exhibits no discharge. No scleral icterus.  Neck: Normal range of motion. Neck supple. No JVD present. No tracheal deviation present. No thyromegaly present.  Cardiovascular: Normal rate, regular rhythm, normal heart sounds and intact distal pulses.  Exam reveals no gallop and no friction rub.   No murmur heard. Pulmonary/Chest: Breath sounds normal. No respiratory distress. He has no wheezes. He has no rales.  Abdominal: Soft. Bowel sounds are normal. He exhibits no mass. There is no hepatosplenomegaly. There is no tenderness. There is no rebound, no guarding and no CVA tenderness.  Musculoskeletal: Normal range of motion. He exhibits no edema or tenderness.  Lymphadenopathy:    He has no cervical adenopathy.  Neurological: He is alert and oriented to person, place, and time. He has normal sensation, normal strength, normal reflexes and intact cranial nerves. No  cranial nerve deficit.  Skin: Skin is warm. No rash noted.  Psychiatric: Mood and affect normal.  Nursing note and vitals reviewed.     Assessment & Plan  Problem List Items Addressed This Visit      Musculoskeletal and Integument   Vertebral compression fracture (HCC) - Primary   DDD (degenerative disc disease), lumbosacral   Relevant Medications   HYDROcodone-acetaminophen (NORCO/VICODIN) 5-325 MG tablet   Polymyositis (HCC) (Chronic)   Relevant Medications   HYDROcodone-acetaminophen (NORCO/VICODIN) 5-325 MG tablet    Other Visit Diagnoses    Allergic rhinitis due to pollen       Depression       Relevant Medications   citalopram (CELEXA) 40 MG tablet   Insomnia       Relevant Medications   temazepam (RESTORIL) 15 MG capsule        Dr. Sierria Bruney [facility]  11/09/15

## 2015-11-09 NOTE — Telephone Encounter (Signed)
Spoke with patient 7/31/17at 1235pm. Pt gave verbal understanding of the plan.

## 2015-11-13 ENCOUNTER — Ambulatory Visit (INDEPENDENT_AMBULATORY_CARE_PROVIDER_SITE_OTHER): Payer: Medicare Other

## 2015-11-13 ENCOUNTER — Telehealth: Payer: Self-pay | Admitting: *Deleted

## 2015-11-13 ENCOUNTER — Inpatient Hospital Stay: Payer: Medicare Other | Attending: Internal Medicine

## 2015-11-13 DIAGNOSIS — C9110 Chronic lymphocytic leukemia of B-cell type not having achieved remission: Secondary | ICD-10-CM

## 2015-11-13 DIAGNOSIS — Z23 Encounter for immunization: Secondary | ICD-10-CM | POA: Diagnosis not present

## 2015-11-13 LAB — CBC WITH DIFFERENTIAL/PLATELET
BASOS ABS: 0 10*3/uL (ref 0–0.1)
BASOS PCT: 0 %
EOS ABS: 0.4 10*3/uL (ref 0–0.7)
Eosinophils Relative: 4 %
HCT: 38.8 % — ABNORMAL LOW (ref 40.0–52.0)
HEMOGLOBIN: 13.3 g/dL (ref 13.0–18.0)
LYMPHS ABS: 5.5 10*3/uL — AB (ref 1.0–3.6)
Lymphocytes Relative: 48 %
MCH: 33.5 pg (ref 26.0–34.0)
MCHC: 34.3 g/dL (ref 32.0–36.0)
MCV: 97.8 fL (ref 80.0–100.0)
Monocytes Absolute: 1.7 10*3/uL — ABNORMAL HIGH (ref 0.2–1.0)
Monocytes Relative: 15 %
NEUTROS PCT: 33 %
Neutro Abs: 3.7 10*3/uL (ref 1.4–6.5)
Platelets: 283 10*3/uL (ref 150–440)
RBC: 3.97 MIL/uL — AB (ref 4.40–5.90)
RDW: 13.9 % (ref 11.5–14.5)
WBC: 11.2 10*3/uL — AB (ref 3.8–10.6)

## 2015-11-13 LAB — LACTATE DEHYDROGENASE: LDH: 429 U/L — ABNORMAL HIGH (ref 98–192)

## 2015-11-13 LAB — COMPREHENSIVE METABOLIC PANEL
ALBUMIN: 3.9 g/dL (ref 3.5–5.0)
ALK PHOS: 64 U/L (ref 38–126)
ALT: 15 U/L — AB (ref 17–63)
AST: 35 U/L (ref 15–41)
Anion gap: 4 — ABNORMAL LOW (ref 5–15)
BUN: 15 mg/dL (ref 6–20)
CALCIUM: 9.4 mg/dL (ref 8.9–10.3)
CO2: 30 mmol/L (ref 22–32)
CREATININE: 0.87 mg/dL (ref 0.61–1.24)
Chloride: 105 mmol/L (ref 101–111)
GFR calc Af Amer: >60 mL/min (ref >60)
GFR calc non Af Amer: >60 mL/min (ref >60)
GLUCOSE: 104 mg/dL — AB (ref 65–99)
Potassium: 4.2 mmol/L (ref 3.5–5.1)
SODIUM: 139 mmol/L (ref 135–145)
Total Bilirubin: 1.4 mg/dL — ABNORMAL HIGH (ref 0.3–1.2)
Total Protein: 6.2 g/dL — ABNORMAL LOW (ref 6.5–8.1)

## 2015-11-13 NOTE — Telephone Encounter (Signed)
Pt notified re: Lab results. No new interventions. Keep all f/us the same.  Pt thanked for calling him.

## 2015-11-13 NOTE — Telephone Encounter (Signed)
-----   Message from Cammie Sickle, MD sent at 11/13/2015  2:35 PM EDT ----- Inform pt labs okay; no new recs- follow up as planned- Dr.B

## 2015-11-16 NOTE — Progress Notes (Signed)
Called patient to inform him that labs are okay.  MD does not have any new recommendations.  Follow up as planned.  Verbalized understanding.

## 2015-11-20 ENCOUNTER — Inpatient Hospital Stay: Payer: Medicare Other

## 2015-11-20 ENCOUNTER — Other Ambulatory Visit: Payer: Self-pay | Admitting: *Deleted

## 2015-11-20 DIAGNOSIS — C9110 Chronic lymphocytic leukemia of B-cell type not having achieved remission: Secondary | ICD-10-CM

## 2015-11-20 LAB — CBC WITH DIFFERENTIAL/PLATELET
BASOS ABS: 0.1 10*3/uL (ref 0–0.1)
EOS ABS: 0.3 10*3/uL (ref 0–0.7)
Eosinophils Relative: 2 %
HCT: 39 % — ABNORMAL LOW (ref 40.0–52.0)
HEMOGLOBIN: 13.5 g/dL (ref 13.0–18.0)
Lymphocytes Relative: 54 %
Lymphs Abs: 6.3 10*3/uL — ABNORMAL HIGH (ref 1.0–3.6)
MCH: 33.7 pg (ref 26.0–34.0)
MCHC: 34.5 g/dL (ref 32.0–36.0)
MCV: 97.5 fL (ref 80.0–100.0)
Monocytes Absolute: 1.6 10*3/uL — ABNORMAL HIGH (ref 0.2–1.0)
Neutro Abs: 3.6 10*3/uL (ref 1.4–6.5)
Platelets: 275 10*3/uL (ref 150–440)
RBC: 4 MIL/uL — AB (ref 4.40–5.90)
RDW: 13.9 % (ref 11.5–14.5)
WBC: 11.9 10*3/uL — AB (ref 3.8–10.6)

## 2015-11-20 LAB — COMPREHENSIVE METABOLIC PANEL
ALBUMIN: 4.3 g/dL (ref 3.5–5.0)
ALK PHOS: 62 U/L (ref 38–126)
ALT: 15 U/L — ABNORMAL LOW (ref 17–63)
ANION GAP: 5 (ref 5–15)
AST: 34 U/L (ref 15–41)
BUN: 15 mg/dL (ref 6–20)
CALCIUM: 9.4 mg/dL (ref 8.9–10.3)
CO2: 29 mmol/L (ref 22–32)
Chloride: 105 mmol/L (ref 101–111)
Creatinine, Ser: 0.96 mg/dL (ref 0.61–1.24)
GFR calc Af Amer: >60 mL/min (ref >60)
GFR calc non Af Amer: >60 mL/min (ref >60)
GLUCOSE: 132 mg/dL — AB (ref 65–99)
Potassium: 4.3 mmol/L (ref 3.5–5.1)
SODIUM: 139 mmol/L (ref 135–145)
Total Bilirubin: 1.4 mg/dL — ABNORMAL HIGH (ref 0.3–1.2)
Total Protein: 6.4 g/dL — ABNORMAL LOW (ref 6.5–8.1)

## 2015-11-20 LAB — LACTATE DEHYDROGENASE: LDH: 399 U/L — ABNORMAL HIGH (ref 98–192)

## 2015-11-23 NOTE — Progress Notes (Signed)
Called patient to inform him that his labs are stable. No new recommendations at this time.  Follow up as planned.  Verbalized understanding.

## 2015-11-27 ENCOUNTER — Other Ambulatory Visit: Payer: Medicare Other

## 2015-11-27 ENCOUNTER — Inpatient Hospital Stay: Payer: Medicare Other | Attending: Internal Medicine

## 2015-11-27 ENCOUNTER — Inpatient Hospital Stay (HOSPITAL_BASED_OUTPATIENT_CLINIC_OR_DEPARTMENT_OTHER): Payer: Medicare Other | Admitting: Internal Medicine

## 2015-11-27 VITALS — BP 137/80 | HR 59 | Temp 96.7°F | Resp 18 | Wt 129.1 lb

## 2015-11-27 DIAGNOSIS — I4510 Unspecified right bundle-branch block: Secondary | ICD-10-CM | POA: Insufficient documentation

## 2015-11-27 DIAGNOSIS — Z79899 Other long term (current) drug therapy: Secondary | ICD-10-CM | POA: Insufficient documentation

## 2015-11-27 DIAGNOSIS — Z87442 Personal history of urinary calculi: Secondary | ICD-10-CM

## 2015-11-27 DIAGNOSIS — I5032 Chronic diastolic (congestive) heart failure: Secondary | ICD-10-CM | POA: Diagnosis not present

## 2015-11-27 DIAGNOSIS — Z87891 Personal history of nicotine dependence: Secondary | ICD-10-CM | POA: Insufficient documentation

## 2015-11-27 DIAGNOSIS — C9110 Chronic lymphocytic leukemia of B-cell type not having achieved remission: Secondary | ICD-10-CM

## 2015-11-27 DIAGNOSIS — R633 Feeding difficulties: Secondary | ICD-10-CM | POA: Diagnosis not present

## 2015-11-27 DIAGNOSIS — E785 Hyperlipidemia, unspecified: Secondary | ICD-10-CM | POA: Diagnosis not present

## 2015-11-27 DIAGNOSIS — D591 Autoimmune hemolytic anemia, unspecified: Secondary | ICD-10-CM

## 2015-11-27 DIAGNOSIS — R001 Bradycardia, unspecified: Secondary | ICD-10-CM | POA: Diagnosis not present

## 2015-11-27 DIAGNOSIS — R1013 Epigastric pain: Secondary | ICD-10-CM | POA: Insufficient documentation

## 2015-11-27 DIAGNOSIS — M818 Other osteoporosis without current pathological fracture: Secondary | ICD-10-CM | POA: Diagnosis not present

## 2015-11-27 DIAGNOSIS — Z9081 Acquired absence of spleen: Secondary | ICD-10-CM

## 2015-11-27 DIAGNOSIS — Z8711 Personal history of peptic ulcer disease: Secondary | ICD-10-CM | POA: Diagnosis not present

## 2015-11-27 DIAGNOSIS — I253 Aneurysm of heart: Secondary | ICD-10-CM | POA: Diagnosis not present

## 2015-11-27 DIAGNOSIS — E538 Deficiency of other specified B group vitamins: Secondary | ICD-10-CM | POA: Insufficient documentation

## 2015-11-27 DIAGNOSIS — M609 Myositis, unspecified: Secondary | ICD-10-CM | POA: Diagnosis not present

## 2015-11-27 DIAGNOSIS — R0602 Shortness of breath: Secondary | ICD-10-CM | POA: Insufficient documentation

## 2015-11-27 DIAGNOSIS — I340 Nonrheumatic mitral (valve) insufficiency: Secondary | ICD-10-CM | POA: Diagnosis not present

## 2015-11-27 DIAGNOSIS — M810 Age-related osteoporosis without current pathological fracture: Secondary | ICD-10-CM

## 2015-11-27 LAB — COMPREHENSIVE METABOLIC PANEL
ALK PHOS: 73 U/L (ref 38–126)
ALT: 14 U/L — AB (ref 17–63)
ANION GAP: 3 — AB (ref 5–15)
AST: 34 U/L (ref 15–41)
Albumin: 4.3 g/dL (ref 3.5–5.0)
BILIRUBIN TOTAL: 1.3 mg/dL — AB (ref 0.3–1.2)
BUN: 17 mg/dL (ref 6–20)
CALCIUM: 9.4 mg/dL (ref 8.9–10.3)
CO2: 30 mmol/L (ref 22–32)
CREATININE: 0.86 mg/dL (ref 0.61–1.24)
Chloride: 105 mmol/L (ref 101–111)
Glucose, Bld: 95 mg/dL (ref 65–99)
Potassium: 4 mmol/L (ref 3.5–5.1)
Sodium: 138 mmol/L (ref 135–145)
TOTAL PROTEIN: 6.5 g/dL (ref 6.5–8.1)

## 2015-11-27 LAB — CBC WITH DIFFERENTIAL/PLATELET
Basophils Absolute: 0.1 10*3/uL (ref 0–0.1)
Basophils Relative: 1 %
EOS ABS: 0.4 10*3/uL (ref 0–0.7)
Eosinophils Relative: 4 %
HCT: 39.7 % — ABNORMAL LOW (ref 40.0–52.0)
HEMOGLOBIN: 14 g/dL (ref 13.0–18.0)
LYMPHS ABS: 5.1 10*3/uL — AB (ref 1.0–3.6)
MCH: 33.9 pg (ref 26.0–34.0)
MCHC: 35.1 g/dL (ref 32.0–36.0)
MCV: 96.5 fL (ref 80.0–100.0)
Monocytes Absolute: 1.9 10*3/uL — ABNORMAL HIGH (ref 0.2–1.0)
Neutro Abs: 3.5 10*3/uL (ref 1.4–6.5)
Platelets: 290 10*3/uL (ref 150–440)
RBC: 4.12 MIL/uL — AB (ref 4.40–5.90)
RDW: 13.7 % (ref 11.5–14.5)
WBC: 11 10*3/uL — AB (ref 3.8–10.6)

## 2015-11-27 LAB — LACTATE DEHYDROGENASE: LDH: 407 U/L — AB (ref 98–192)

## 2015-11-27 NOTE — Progress Notes (Signed)
[facility] OFFICE PROGRESS NOTE  Patient Care Team: Juline Patch, MD as PCP - General (Family Medicine) Kathrynn Ducking, MD (Neurology) Forest Gleason, MD as Consulting Physician (Unknown Physician Specialty) Carlena Bjornstad, MD as Consulting Physician (Cardiology) Seeplaputhur Robinette Haines, MD (General Surgery) Minna Merritts, MD as Consulting Physician (Cardiology)   SUMMARY OF ONCOLOGIC HISTORY:  Oncology History    1.  Autoimmune hemolytic anemia. Previously received Prednisone taper.   2.  Splenectomy in 5/02.  3.  Gastric ulcer diagnosed on endoscopy and multiple biopsies are pending, November 2005. 4.  Chronic lymphocytic leukemia. 5.vitamin B 12 deficiency 6.  Recent admission in the hospital in June of 2016 with hemolytic anemia and hemoglobin of 5.5 patient received blood transfusion was started on steroid 7.  Patient was started on rituximab (October 20, 2014  # April- MAY 2017- RITUXIMAB q w x4-      Chronic lymphocytic leukemia ([place])   08/01/2008 Initial Diagnosis    Chronic lymphocytic leukemia         INTERVAL HISTORY:  82 year old male patient with above history of CLL/history of hemolytic anemia-  The most recent relapse in March 2017  Status post rituximab approximately 3 months ago- is also currently off steroids approximately 3 weeks ago is here for follow-up.  Patient denies any worsening shortness of breath. Denies abdominal pain. Denies any skin yellowing or rash. Denies any difficulty swallowing. No fevers or chills.    REVIEW OF SYSTEMS:  A complete 10 point review of system is done which is negative except mentioned above/history of present illness.   PAST MEDICAL HISTORY :  Past Medical History:  Diagnosis Date  . Abdominal pain, epigastric   . Aortic insufficiency 2009   mild, Mild, echo, 2012  . Atrial septal aneurysm 2009   Insignificant, no, January, 2009  . Bradycardia    Sinus bradycardia, old  . Calculus of kidney   .  Cellulitis   . Chest pain    EF 55%, echo, October, 2009, possible inferior and posterior borderline hypokinesis, patient has never  had cardiac catheterization  . Chronic diastolic CHF (congestive heart failure) ([place]) 03/17/2015  . CLL (chronic lymphoblastic leukemia) 2006   Rituxan treatment 2006  . Diastolic dysfunction    mild  . Dyslipidemia    statins not used due to LFT abnormalities  . Edema    EF 55%, echo, October, 2009, possible borderline inferior and posterior hypokinesis... heart catheterization has never been done(July 10, 2009)  . Edema of male genital organs    Resolved, occurred during illness related to myositis  . Ejection fraction    EF 60%, echo, October, 2012  . Febrile illness    May, 2013, question sinusitis  . Feeding difficulties and mismanagement   . Hemolytic anemia ([place])   . Herpes zoster    right chest  . Infection of PEG site (HCC)    cellulitis.Marland KitchenResolved  . Inguinal hernia without mention of obstruction or gangrene, unilateral or unspecified, (not specified as recurrent)    right   . Insomnia   . LFT elevation    Related to Imuran in the past... dose was adjusted  . Mitral regurgitation    Mild / moderate, echo, 2012  . Myositis    Caused pulmonary insufficiency with muscle weakness in the past / Dr.Willis-neurology, Imuran and prednisone  . Nonspecific elevation of levels of transaminase or lactic acid dehydrogenase (LDH)   . Polymyositis ([place]) 11/26/2012  . RBBB (right  bundle branch block)    Old  . Right ventricular dysfunction    Mild, echo, 2012  . Shortness of breath    Breathing abnormalities related to muscle weakness from myositis  . Swelling of arm    left.Marland KitchenMarland KitchenResolved.... no DVT  . Unspecified gastritis and gastroduodenitis without mention of hemorrhage   . Vertebral compression fracture ([place])    multiple levels    PAST SURGICAL HISTORY :   Past Surgical History:  Procedure Laterality Date  . CATARACT EXTRACTION     bi  lateral  . HERNIA REPAIR     left side  . KYPHOSIS SURGERY     multilevel vertebral  . NASAL SINUS SURGERY     x2  . ODONTOID FRACTURE SURGERY     anterior screw fixation  . PEG PLACEMENT  06/2007  . PEG TUBE REMOVAL  12/2007  . SPLENECTOMY    . TEE WITHOUT CARDIOVERSION N/A 07/01/2015   Procedure: TRANSESOPHAGEAL ECHOCARDIOGRAM (TEE);  Surgeon: Dixie Dials, MD;  Location: [facility] ENDOSCOPY;  Service: Cardiovascular;  Laterality: N/A;    FAMILY HISTORY :   Family History  Problem Relation Age of Onset  . Stroke Mother   . Hypertension Mother   . Emphysema Father   . Breast cancer Sister   . Colon cancer Brother   . Prostate cancer Brother   . Diabetes Grandchild   . Cancer Brother   . Cancer Sister   . Cancer Daughter   . Heart attack Neg Hx     SOCIAL HISTORY:   Social History  Substance Use Topics  . Smoking status: Former Smoker    Packs/day: 0.50    Years: 31.00    Types: Cigarettes    Quit date: 06/29/1968  . Smokeless tobacco: Former Systems developer    Types: Chew  . Alcohol use No     Comment: rarely    ALLERGIES:  is allergic to sulfonamide derivatives.  MEDICATIONS:  Current Outpatient Prescriptions  Medication Sig Dispense Refill  . acyclovir (ZOVIRAX) 200 MG capsule Take 1 capsule (200 mg total) by mouth 2 (two) times daily. 60 capsule 12  . albuterol (PROVENTIL) (2.5 MG/3ML) 0.083% nebulizer solution Take 3 mLs (2.5 mg total) by nebulization every 6 (six) hours as needed for wheezing or shortness of breath. 75 mL 12  . ALPRAZolam (XANAX) 0.25 MG tablet Take 1 tablet (0.25 mg total) by mouth at bedtime as needed for anxiety. 30 tablet 0  . atorvastatin (LIPITOR) 10 MG tablet Take 1 tablet (10 mg total) by mouth daily. 30 tablet 11  . calcium-vitamin D (CALCIUM 500+D) 500-200 MG-UNIT per tablet Take 1 tablet by mouth 2 (two) times daily.      . Cholecalciferol (VITAMIN D3) 1000 UNITS tablet Take 1,000 Units by mouth daily.      . citalopram (CELEXA) 40 MG tablet Take 1  tablet (40 mg total) by mouth daily. 90 tablet 1  . cyanocobalamin (,VITAMIN B-12,) 1000 MCG/ML injection Inject 1 mL (1,000 mcg total) into the muscle every 14 (fourteen) days. Please dispense 10 ml vial to patient. 10 mL 6  . ferrous sulfate 325 (65 FE) MG tablet Take 325 mg by mouth daily with breakfast.      . fish oil-omega-3 fatty acids 1000 MG capsule Take 1 g by mouth daily at 12 noon.     . folic acid (FOLVITE) 1 MG tablet Take 4 tablets (4 mg total) by mouth daily. 120 tablet 0  . furosemide (LASIX) 80 MG tablet Take  1 tablet (80 mg total) by mouth daily. (Patient taking differently: Take 40 mg by mouth daily. ) 90 tablet 3  . HYDROcodone-acetaminophen (NORCO/VICODIN) 5-325 MG tablet Take 1 tablet by mouth 3 (three) times daily. #90 per 30 days 90 tablet 0  . montelukast (SINGULAIR) 10 MG tablet Take 1 tablet (10 mg total) by mouth daily. 90 tablet 1  . Multiple Vitamin (MULTIVITAMIN) capsule Take 1 capsule by mouth daily.      . mycophenolate (CELLCEPT) 500 MG tablet Take 1 tablet (500 mg total) by mouth daily. 90 tablet 3  . nystatin cream (MYCOSTATIN) APPLY TOPICALLY TWICE DAILY 30 g 0  . potassium chloride SA (K-DUR,KLOR-CON) 20 MEQ tablet Take 1 tablet (20 mEq total) by mouth 2 (two) times daily. 180 tablet 3  . PROAIR HFA 108 (90 BASE) MCG/ACT inhaler Inhale 2 puffs into the lungs every 6 (six) hours as needed for shortness of breath.   11  . Resource Beneprotein PACK Take 3 each by mouth 3 (three) times daily. Take 3g three times a day    . temazepam (RESTORIL) 15 MG capsule Take 1 capsule (15 mg total) by mouth at bedtime as needed for sleep (1-2 q hs). 60 capsule 5   No current facility-administered medications for this visit.     PHYSICAL EXAMINATION: ECOG PERFORMANCE STATUS: 1 - Symptomatic but completely ambulatory  BP 137/80 (BP Location: Left Arm, Patient Position: Sitting)   Pulse (!) 59   Temp (!) 96.7 F (35.9 C) (Tympanic)   Resp 18   Wt 129 lb 1 oz (58.5 kg)    BMI 21.48 kg/m   Filed Weights   11/27/15 0912  Weight: 129 lb 1 oz (58.5 kg)    GENERAL: Frail-appearing Caucasian male patient. He is up and about by himself. Alert, no distress and comfortable.   EYES: no pallor; no jaundice. OROPHARYNX: no thrush or ulceration; good dentition  NECK: supple, no masses felt LYMPH:  no palpable lymphadenopathy in the cervical, axillary or inguinal regions LUNGS: Bilateral coarse breath sounds. No wheeze or crackles HEART/CVS: regular rate & rhythm and no murmurs; No lower extremity edema ABDOMEN:abdomen soft, non-tender and normal bowel sounds Musculoskeletal:no cyanosis of digits and no clubbing  PSYCH: alert & oriented x 3 with fluent speech NEURO: no focal motor/sensory deficits SKIN: ICystic component noted in the right elbow.   LABORATORY DATA:  I have reviewed the data as listed    Component Value Date/Time   NA 138 11/27/2015 0859   NA 140 06/17/2014 0933   NA 139 03/21/2014 1040   K 4.0 11/27/2015 0859   K 4.6 03/21/2014 1040   CL 105 11/27/2015 0859   CL 102 03/21/2014 1040   CO2 30 11/27/2015 0859   CO2 32 03/21/2014 1040   GLUCOSE 95 11/27/2015 0859   GLUCOSE 93 03/21/2014 1040   BUN 17 11/27/2015 0859   BUN 18 06/17/2014 0933   BUN 20 (H) 03/21/2014 1040   CREATININE 0.86 11/27/2015 0859   CREATININE 1.06 03/21/2014 1040   CALCIUM 9.4 11/27/2015 0859   CALCIUM 9.2 03/21/2014 1040   PROT 6.5 11/27/2015 0859   PROT 6.3 06/17/2014 0933   PROT 7.0 03/21/2014 1040   ALBUMIN 4.3 11/27/2015 0859   ALBUMIN 4.1 06/17/2014 0933   ALBUMIN 3.7 03/21/2014 1040   AST 34 11/27/2015 0859   AST 36 03/21/2014 1040   ALT 14 (L) 11/27/2015 0859   ALT 22 03/21/2014 1040   ALKPHOS 73 11/27/2015 0859  ALKPHOS 92 03/21/2014 1040   BILITOT 1.3 (H) 11/27/2015 0859   BILITOT 0.8 06/17/2014 0933   BILITOT 1.0 03/21/2014 1040   GFRNONAA >60 11/27/2015 0859   GFRNONAA >60 03/21/2014 1040   GFRNONAA >60 09/20/2013 0953   GFRAA >60  11/27/2015 0859   GFRAA >60 03/21/2014 1040   GFRAA >60 09/20/2013 0953    No results found for: SPEP, UPEP  Lab Results  Component Value Date   WBC 11.0 (H) 11/27/2015   NEUTROABS 3.5 11/27/2015   HGB 14.0 11/27/2015   HCT 39.7 (L) 11/27/2015   MCV 96.5 11/27/2015   PLT 290 11/27/2015      Chemistry      Component Value Date/Time   NA 138 11/27/2015 0859   NA 140 06/17/2014 0933   NA 139 03/21/2014 1040   K 4.0 11/27/2015 0859   K 4.6 03/21/2014 1040   CL 105 11/27/2015 0859   CL 102 03/21/2014 1040   CO2 30 11/27/2015 0859   CO2 32 03/21/2014 1040   BUN 17 11/27/2015 0859   BUN 18 06/17/2014 0933   BUN 20 (H) 03/21/2014 1040   CREATININE 0.86 11/27/2015 0859   CREATININE 1.06 03/21/2014 1040      Component Value Date/Time   CALCIUM 9.4 11/27/2015 0859   CALCIUM 9.2 03/21/2014 1040   ALKPHOS 73 11/27/2015 0859   ALKPHOS 92 03/21/2014 1040   AST 34 11/27/2015 0859   AST 36 03/21/2014 1040   ALT 14 (L) 11/27/2015 0859   ALT 22 03/21/2014 1040   BILITOT 1.3 (H) 11/27/2015 0859   BILITOT 0.8 06/17/2014 0933   BILITOT 1.0 03/21/2014 1040       ASSESSMENT & PLAN:   Autoimmune hemolytic anemia (HCC) # Hemolytic anemia- secondary to CLL-Currently  status post 4 treatments of rituximab approximately 2 months ago. Patient's hemoglobin is 13/white count stable platelets normal. Off prednsone for last 3 weeks.   # CLL- Atypical phenotype noted on the peripheral blood flow cytometry; fish negative.   # Severe osteoporosis- patient had been Zometa in the past. Start prolia every 6 m. Discussed re: ca=Vit D; also ONJ. Start in appx 10 days.   #For now check cbc q 3w/ follow up in 3 months/mebane.   # 15 minutes face-to-face with the patient discussing the above plan of care; more than 50% of time spent on natural history; counseling and coordination.    Cammie Sickle, MD 11/27/2015 11:53 AM

## 2015-11-27 NOTE — Assessment & Plan Note (Addendum)
#   Hemolytic anemia- secondary to CLL-Currently  status post 4 treatments of rituximab approximately 2 months ago. Patient's hemoglobin is 13/white count stable platelets normal. Off prednsone for last 3 weeks.   # CLL- Atypical phenotype noted on the peripheral blood flow cytometry; fish negative.   # Severe osteoporosis- patient had been Zometa in the past. Start prolia every 6 m. Discussed re: ca=Vit D; also ONJ. Start in appx 10 days.   #For now check cbc q 3w/ follow up in 3 months/mebane.

## 2015-12-08 ENCOUNTER — Other Ambulatory Visit: Payer: Self-pay | Admitting: Family Medicine

## 2015-12-08 ENCOUNTER — Other Ambulatory Visit: Payer: Self-pay

## 2015-12-08 ENCOUNTER — Inpatient Hospital Stay: Payer: Medicare Other

## 2015-12-08 DIAGNOSIS — M3320 Polymyositis, organ involvement unspecified: Secondary | ICD-10-CM

## 2015-12-08 DIAGNOSIS — M359 Systemic involvement of connective tissue, unspecified: Principal | ICD-10-CM

## 2015-12-18 ENCOUNTER — Inpatient Hospital Stay: Payer: Medicare Other | Attending: Internal Medicine

## 2015-12-18 DIAGNOSIS — C9110 Chronic lymphocytic leukemia of B-cell type not having achieved remission: Secondary | ICD-10-CM | POA: Diagnosis not present

## 2015-12-18 LAB — COMPREHENSIVE METABOLIC PANEL
ALK PHOS: 67 U/L (ref 38–126)
ALT: 14 U/L — ABNORMAL LOW (ref 17–63)
ANION GAP: 5 (ref 5–15)
AST: 35 U/L (ref 15–41)
Albumin: 4.3 g/dL (ref 3.5–5.0)
BILIRUBIN TOTAL: 1.5 mg/dL — AB (ref 0.3–1.2)
BUN: 19 mg/dL (ref 6–20)
CALCIUM: 9.8 mg/dL (ref 8.9–10.3)
CO2: 31 mmol/L (ref 22–32)
Chloride: 105 mmol/L (ref 101–111)
Creatinine, Ser: 0.84 mg/dL (ref 0.61–1.24)
GLUCOSE: 97 mg/dL (ref 65–99)
POTASSIUM: 4.5 mmol/L (ref 3.5–5.1)
Sodium: 141 mmol/L (ref 135–145)
TOTAL PROTEIN: 6.7 g/dL (ref 6.5–8.1)

## 2015-12-18 LAB — CBC WITH DIFFERENTIAL/PLATELET
BASOS ABS: 0.1 10*3/uL (ref 0–0.1)
BASOS PCT: 1 %
EOS PCT: 2 %
Eosinophils Absolute: 0.2 10*3/uL (ref 0–0.7)
HEMATOCRIT: 39.6 % — AB (ref 40.0–52.0)
Hemoglobin: 13.6 g/dL (ref 13.0–18.0)
LYMPHS ABS: 6.3 10*3/uL — AB (ref 1.0–3.6)
LYMPHS PCT: 51 %
MCH: 33.7 pg (ref 26.0–34.0)
MCHC: 34.4 g/dL (ref 32.0–36.0)
MCV: 98.2 fL (ref 80.0–100.0)
MONOS PCT: 18 %
Monocytes Absolute: 2.2 10*3/uL — ABNORMAL HIGH (ref 0.2–1.0)
NEUTROS ABS: 3.4 10*3/uL (ref 1.4–6.5)
Neutrophils Relative %: 28 %
PLATELETS: 263 10*3/uL (ref 150–440)
RBC: 4.03 MIL/uL — ABNORMAL LOW (ref 4.40–5.90)
RDW: 13.5 % (ref 11.5–14.5)
WBC: 12.2 10*3/uL — ABNORMAL HIGH (ref 3.8–10.6)

## 2015-12-18 LAB — LACTATE DEHYDROGENASE: LDH: 425 U/L — ABNORMAL HIGH (ref 98–192)

## 2015-12-29 ENCOUNTER — Ambulatory Visit (INDEPENDENT_AMBULATORY_CARE_PROVIDER_SITE_OTHER): Payer: Medicare Other | Admitting: Neurology

## 2015-12-29 ENCOUNTER — Encounter: Payer: Self-pay | Admitting: Neurology

## 2015-12-29 VITALS — BP 116/58 | HR 54 | Ht 65.0 in | Wt 129.0 lb

## 2015-12-29 DIAGNOSIS — M3320 Polymyositis, organ involvement unspecified: Secondary | ICD-10-CM

## 2015-12-29 NOTE — Progress Notes (Signed)
Reason for visit: Polymyositis  [name] [name] is an 82 y.o. male  History of present illness:  [name] [name] is an 82 year old right-handed white male with a history of polymyositis that has been well controlled. The patient is on low-dose CellCept taking 500 mg daily. He is off of prednisone. He has been treated in 2016 with Rituxan for his CLL. The patient denies any new medical issues that have come up since last seen. He denies any weakness of the extremities. He is tolerating the medication well.  Past Medical History:  Diagnosis Date  . Abdominal pain, epigastric   . Aortic insufficiency 2009   mild, Mild, echo, 2012  . Atrial septal aneurysm 2009   Insignificant, no, January, 2009  . Bradycardia    Sinus bradycardia, old  . Calculus of kidney   . Cellulitis   . Chest pain    EF 55%, echo, October, 2009, possible inferior and posterior borderline hypokinesis, patient has never  had cardiac catheterization  . Chronic diastolic CHF (congestive heart failure) ([place]) 03/17/2015  . CLL (chronic lymphoblastic leukemia) 2006   Rituxan treatment 2006  . Diastolic dysfunction    mild  . Dyslipidemia    statins not used due to LFT abnormalities  . Edema    EF 55%, echo, October, 2009, possible borderline inferior and posterior hypokinesis... heart catheterization has never been done(July 10, 2009)  . Edema of male genital organs    Resolved, occurred during illness related to myositis  . Ejection fraction    EF 60%, echo, October, 2012  . Febrile illness    May, 2013, question sinusitis  . Feeding difficulties and mismanagement   . Hemolytic anemia ([place])   . Herpes zoster    right chest  . Infection of PEG site (HCC)    cellulitis.Marland KitchenResolved  . Inguinal hernia without mention of obstruction or gangrene, unilateral or unspecified, (not specified as recurrent)    right   . Insomnia   . LFT elevation    Related to Imuran in the past... dose was adjusted  . Mitral regurgitation     Mild / moderate, echo, 2012  . Myositis    Caused pulmonary insufficiency with muscle weakness in the past / Dr.Simmie Garin-neurology, Imuran and prednisone  . Nonspecific elevation of levels of transaminase or lactic acid dehydrogenase (LDH)   . Polymyositis ([place]) 11/26/2012  . RBBB (right bundle branch block)    Old  . Right ventricular dysfunction    Mild, echo, 2012  . Shortness of breath    Breathing abnormalities related to muscle weakness from myositis  . Swelling of arm    left.Marland KitchenMarland KitchenResolved.... no DVT  . Unspecified gastritis and gastroduodenitis without mention of hemorrhage   . Vertebral compression fracture (HCC)    multiple levels    Past Surgical History:  Procedure Laterality Date  . CATARACT EXTRACTION     bi lateral  . HERNIA REPAIR     left side  . KYPHOSIS SURGERY     multilevel vertebral  . NASAL SINUS SURGERY     x2  . ODONTOID FRACTURE SURGERY     anterior screw fixation  . PEG PLACEMENT  06/2007  . PEG TUBE REMOVAL  12/2007  . SPLENECTOMY    . TEE WITHOUT CARDIOVERSION N/A 07/01/2015   Procedure: TRANSESOPHAGEAL ECHOCARDIOGRAM (TEE);  Surgeon: Dixie Dials, MD;  Location: [facility] ENDOSCOPY;  Service: Cardiovascular;  Laterality: N/A;    Family History  Problem Relation Age of Onset  . Stroke Mother   .  Hypertension Mother   . Emphysema Father   . Breast cancer Sister   . Colon cancer Brother   . Prostate cancer Brother   . Diabetes Grandchild   . Cancer Brother   . Cancer Sister   . Cancer Daughter   . Heart attack Neg Hx     Social history:  reports that he quit smoking about 47 years ago. His smoking use included Cigarettes. He has a 15.50 pack-year smoking history. He has quit using smokeless tobacco. His smokeless tobacco use included Chew. He reports that he does not drink alcohol or use drugs.    Allergies  Allergen Reactions  . Sulfonamide Derivatives Itching and Rash    Medications:  Prior to Admission medications   Medication Sig Start  Date End Date Taking? Authorizing Provider  acyclovir (ZOVIRAX) 200 MG capsule Take 1 capsule (200 mg total) by mouth 2 (two) times daily. 07/28/15  Yes Cammie Sickle, MD  albuterol (PROVENTIL) (2.5 MG/3ML) 0.083% nebulizer solution Take 3 mLs (2.5 mg total) by nebulization every 6 (six) hours as needed for wheezing or shortness of breath. 01/06/15  Yes Juline Patch, MD  atorvastatin (LIPITOR) 10 MG tablet Take 1 tablet (10 mg total) by mouth daily. 09/25/15  Yes Minna Merritts, MD  calcium-vitamin D (CALCIUM 500+D) 500-200 MG-UNIT per tablet Take 1 tablet by mouth 2 (two) times daily.     Yes Historical Provider, MD  Cholecalciferol (VITAMIN D3) 1000 UNITS tablet Take 1,000 Units by mouth daily.     Yes Historical Provider, MD  citalopram (CELEXA) 40 MG tablet Take 1 tablet (40 mg total) by mouth daily. 11/09/15  Yes Juline Patch, MD  cyanocobalamin (,VITAMIN B-12,) 1000 MCG/ML injection Inject 1 mL (1,000 mcg total) into the muscle every 14 (fourteen) days. Please dispense 10 ml vial to patient. 08/17/15  Yes Cammie Sickle, MD  ferrous sulfate 325 (65 FE) MG tablet Take 325 mg by mouth daily with breakfast.     Yes Historical Provider, MD  fish oil-omega-3 fatty acids 1000 MG capsule Take 1 g by mouth daily at 12 noon.    Yes Historical Provider, MD  folic acid (FOLVITE) 1 MG tablet Take 4 tablets (4 mg total) by mouth daily. 10/14/14  Yes Shanker Kristeen Mans, MD  furosemide (LASIX) 80 MG tablet Take 1 tablet (80 mg total) by mouth daily. Patient taking differently: Take 40 mg by mouth daily.  03/17/15  Yes Minna Merritts, MD  montelukast (SINGULAIR) 10 MG tablet Take 1 tablet (10 mg total) by mouth daily. 11/09/15  Yes Juline Patch, MD  Multiple Vitamin (MULTIVITAMIN) capsule Take 1 capsule by mouth daily.     Yes Historical Provider, MD  mycophenolate (CELLCEPT) 500 MG tablet Take 1 tablet (500 mg total) by mouth daily. 06/18/15  Yes Kathrynn Ducking, MD  nystatin cream (MYCOSTATIN)  APPLY TOPICALLY TWICE DAILY 01/12/15  Yes Juline Patch, MD  potassium chloride SA (K-DUR,KLOR-CON) 20 MEQ tablet Take 1 tablet (20 mEq total) by mouth 2 (two) times daily. 03/17/15  Yes Minna Merritts, MD  PROAIR HFA 108 (90 BASE) MCG/ACT inhaler Inhale 2 puffs into the lungs every 6 (six) hours as needed for shortness of breath.  08/21/14  Yes Historical Provider, MD  Resource Beneprotein PACK Take 3 each by mouth 3 (three) times daily. Take 3g three times a day   Yes Historical Provider, MD  temazepam (RESTORIL) 15 MG capsule Take 1 capsule (15 mg total)  by mouth at bedtime as needed for sleep (1-2 q hs). 11/09/15  Yes Juline Patch, MD    ROS:  Out of a complete 14 system review of symptoms, the patient complains only of the following symptoms, and all other reviewed systems are negative.  Fatigue Ringing in the ears Shortness of breath Urinary urgency  Blood pressure (!) 116/58, pulse (!) 54, height 5\' 5"  (1.651 m), weight 129 lb (58.5 kg).  Physical Exam  General: The patient is alert and cooperative at the time of the examination.  Skin: No significant peripheral edema is noted.   Neurologic Exam  Mental status: The patient is alert and oriented x 3 at the time of the examination. The patient has apparent normal recent and remote memory, with an apparently normal attention span and concentration ability.   Cranial nerves: Facial symmetry is present. Speech is normal, no aphasia or dysarthria is noted. Extraocular movements are full. Visual fields are full.  Motor: The patient has good strength in all 4 extremities.  Sensory examination: Soft touch sensation is symmetric on the face, arms, and legs.  Coordination: The patient has good finger-nose-finger and heel-to-shin bilaterally.  Gait and station: The patient has a normal gait. Tandem gait is normal. Romberg is negative. No drift is seen. The patient is able to rise from a seated position with arms  crossed.  Reflexes: Deep tendon reflexes are symmetric.   Assessment/Plan:  1. Polymyositis  2. CLL  The patient is doing quite well at this time, he will continue the CellCept, he has had blood work done recently on 12/24/2015. White count is 12.2, hemoglobin 13.6, hematocrit 39.6, platelets of 263. Chemistry profile shows a sodium 141, potassium 4.5, chloride 105, CO2 31, BUN of 19, creatinine 0.84, calcium 9.8, total protein 6.7, albumin of 4.3, liver enzymes are normal. He will follow-up in 6 months.   Jill Alexanders MD 12/29/2015 10:22 AM  [facility] [address] [place], [place] [address]  Phone [phone] Fax [phone]

## 2016-01-06 ENCOUNTER — Ambulatory Visit (INDEPENDENT_AMBULATORY_CARE_PROVIDER_SITE_OTHER): Payer: Medicare Other | Admitting: Family Medicine

## 2016-01-06 ENCOUNTER — Encounter: Payer: Self-pay | Admitting: Family Medicine

## 2016-01-06 VITALS — BP 120/78 | HR 72 | Temp 98.5°F | Ht 65.0 in | Wt 131.0 lb

## 2016-01-06 DIAGNOSIS — K5732 Diverticulitis of large intestine without perforation or abscess without bleeding: Secondary | ICD-10-CM | POA: Diagnosis not present

## 2016-01-06 MED ORDER — METRONIDAZOLE 500 MG PO TABS: 500.0000 mg | ORAL_TABLET | Freq: Three times a day (TID) | ORAL | 0 refills | Status: AC

## 2016-01-06 MED ORDER — AMOXICILLIN-POT CLAVULANATE 875-125 MG PO TABS
1.0000 | ORAL_TABLET | Freq: Two times a day (BID) | ORAL | 0 refills | Status: DC
Start: 2016-01-06 — End: 2016-01-14

## 2016-01-06 NOTE — Progress Notes (Signed)
Name: [name] [name]   MRN: [id number]    DOB: 1929/07/10   Date:01/06/2016       Progress Note  Subjective  Chief Complaint  Chief Complaint  Patient presents with  . Abdominal Pain    on Left side "feels like diverticulitis"    Abdominal Pain  This is a recurrent problem. The current episode started in the past 7 days. The onset quality is gradual. The problem occurs intermittently. The problem has been gradually worsening. The pain is located in the LLQ. The pain is at a severity of 2/10. The pain is mild. The quality of the pain is colicky and cramping. The abdominal pain does not radiate. Associated symptoms include a fever. Pertinent negatives include no constipation, diarrhea, dysuria, flatus, frequency, headaches, hematochezia, hematuria, melena, myalgias, nausea or weight loss. Nothing aggravates the pain. The pain is relieved by nothing. He has tried nothing for the symptoms. diverticulosis    No problem-specific Assessment & Plan notes found for this encounter.   Past Medical History:  Diagnosis Date  . Abdominal pain, epigastric   . Aortic insufficiency 2009   mild, Mild, echo, 2012  . Atrial septal aneurysm 2009   Insignificant, no, January, 2009  . Bradycardia    Sinus bradycardia, old  . Calculus of kidney   . Cellulitis   . Chest pain    EF 55%, echo, October, 2009, possible inferior and posterior borderline hypokinesis, patient has never  had cardiac catheterization  . Chronic diastolic CHF (congestive heart failure) ([place]) 03/17/2015  . CLL (chronic lymphoblastic leukemia) 2006   Rituxan treatment 2006  . Diastolic dysfunction    mild  . Dyslipidemia    statins not used due to LFT abnormalities  . Edema    EF 55%, echo, October, 2009, possible borderline inferior and posterior hypokinesis... heart catheterization has never been done(July 10, 2009)  . Edema of male genital organs    Resolved, occurred during illness related to myositis  . Ejection fraction     EF 60%, echo, October, 2012  . Febrile illness    May, 2013, question sinusitis  . Feeding difficulties and mismanagement   . Hemolytic anemia ([place])   . Herpes zoster    right chest  . Infection of PEG site (HCC)    cellulitis.Marland KitchenResolved  . Inguinal hernia without mention of obstruction or gangrene, unilateral or unspecified, (not specified as recurrent)    right   . Insomnia   . LFT elevation    Related to Imuran in the past... dose was adjusted  . Mitral regurgitation    Mild / moderate, echo, 2012  . Myositis    Caused pulmonary insufficiency with muscle weakness in the past / Dr.Willis-neurology, Imuran and prednisone  . Nonspecific elevation of levels of transaminase or lactic acid dehydrogenase (LDH)   . Polymyositis ([place]) 11/26/2012  . RBBB (right bundle branch block)    Old  . Right ventricular dysfunction    Mild, echo, 2012  . Shortness of breath    Breathing abnormalities related to muscle weakness from myositis  . Swelling of arm    left.Marland KitchenMarland KitchenResolved.... no DVT  . Unspecified gastritis and gastroduodenitis without mention of hemorrhage   . Vertebral compression fracture (HCC)    multiple levels    Past Surgical History:  Procedure Laterality Date  . CATARACT EXTRACTION     bi lateral  . HERNIA REPAIR     left side  . KYPHOSIS SURGERY     multilevel vertebral  .  NASAL SINUS SURGERY     x2  . ODONTOID FRACTURE SURGERY     anterior screw fixation  . PEG PLACEMENT  06/2007  . PEG TUBE REMOVAL  12/2007  . SPLENECTOMY    . TEE WITHOUT CARDIOVERSION N/A 07/01/2015   Procedure: TRANSESOPHAGEAL ECHOCARDIOGRAM (TEE);  Surgeon: Dixie Dials, MD;  Location[facility] ENDOSCOPY;  Service: Cardiovascular;  Laterality: N/A;    Family History  Problem Relation Age of Onset  . Stroke Mother   . Hypertension Mother   . Emphysema Father   . Breast cancer Sister   . Colon cancer Brother   . Prostate cancer Brother   . Diabetes Grandchild   . Cancer Brother   . Cancer Sister    . Cancer Daughter   . Heart attack Neg Hx     Social History   Social History  . Marital status: Widowed    Spouse name: N/A  . Number of children: 4  . Years of education: college   Occupational History  . retired    Social History Main Topics  . Smoking status: Former Smoker    Packs/day: 0.50    Years: 31.00    Types: Cigarettes    Quit date: 06/29/1968  . Smokeless tobacco: Former Systems developer    Types: Chew  . Alcohol use No     Comment: rarely  . Drug use: No  . Sexual activity: No   Other Topics Concern  . Not on file   Social History Narrative   Patient is widowed and lives alone.   Patient is retired.   Patient has four adult children.   Patient has a college degree.   Patient is right-handed.   Patient drinks one cup of coffee daily.    Allergies  Allergen Reactions  . Sulfonamide Derivatives Itching and Rash     Review of Systems  Constitutional: Positive for fever. Negative for chills, malaise/fatigue and weight loss.  HENT: Negative for ear discharge, ear pain and sore throat.   Eyes: Negative for blurred vision.  Respiratory: Negative for cough, sputum production, shortness of breath and wheezing.   Cardiovascular: Negative for chest pain, palpitations and leg swelling.  Gastrointestinal: Positive for abdominal pain. Negative for blood in stool, constipation, diarrhea, flatus, heartburn, hematochezia, melena and nausea.  Genitourinary: Negative for dysuria, frequency, hematuria and urgency.  Musculoskeletal: Negative for back pain, joint pain, myalgias and neck pain.  Skin: Negative for rash.  Neurological: Negative for dizziness, tingling, sensory change, focal weakness and headaches.  Endo/Heme/Allergies: Negative for environmental allergies and polydipsia. Does not bruise/bleed easily.  Psychiatric/Behavioral: Negative for depression and suicidal ideas. The patient is not nervous/anxious and does not have insomnia.      Objective  Vitals:    01/06/16 0940  BP: 120/78  Pulse: 72  Temp: 98.5 F (36.9 C)  Weight: 131 lb (59.4 kg)  Height: 5\' 5"  (1.651 m)    Physical Exam  Constitutional: He is oriented to person, place, and time and well-developed, well-nourished, and in no distress.  HENT:  Head: Normocephalic.  Right Ear: Tympanic membrane, external ear and ear canal normal.  Left Ear: Tympanic membrane, external ear and ear canal normal.  Nose: Nose normal.  Mouth/Throat: Oropharynx is clear and moist.  Eyes: Conjunctivae and EOM are normal. Pupils are equal, round, and reactive to light. Right eye exhibits no discharge. Left eye exhibits no discharge. No scleral icterus.  Neck: Normal range of motion. Neck supple. No JVD present. No tracheal deviation present.  No thyromegaly present.  Cardiovascular: Normal rate, regular rhythm, normal heart sounds and intact distal pulses.  Exam reveals no gallop and no friction rub.   No murmur heard. Pulmonary/Chest: Breath sounds normal. No respiratory distress. He has no wheezes. He has no rales.  Abdominal: Soft. Bowel sounds are normal. He exhibits no mass. There is no hepatosplenomegaly. There is tenderness in the left lower quadrant. There is no rigidity, no rebound, no guarding and no CVA tenderness.  Musculoskeletal: Normal range of motion. He exhibits no edema or tenderness.  Lymphadenopathy:    He has no cervical adenopathy.  Neurological: He is alert and oriented to person, place, and time. He has normal sensation, normal strength, normal reflexes and intact cranial nerves. No cranial nerve deficit.  Skin: Skin is warm. No rash noted.  Psychiatric: Mood and affect normal.  Nursing note and vitals reviewed.     Assessment & Plan  Problem List Items Addressed This Visit    None    Visit Diagnoses    Diverticulitis of large intestine without perforation or abscess without bleeding    -  Primary   Relevant Medications   amoxicillin-clavulanate (AUGMENTIN) 875-125 MG  tablet   metroNIDAZOLE (FLAGYL) 500 MG tablet        Dr. Macon [facility]  01/06/16

## 2016-01-08 ENCOUNTER — Inpatient Hospital Stay: Payer: Medicare Other

## 2016-01-08 DIAGNOSIS — C9110 Chronic lymphocytic leukemia of B-cell type not having achieved remission: Secondary | ICD-10-CM

## 2016-01-08 LAB — CBC WITH DIFFERENTIAL/PLATELET
BASOS ABS: 0.1 10*3/uL (ref 0–0.1)
Basophils Relative: 1 %
Eosinophils Absolute: 0.5 10*3/uL (ref 0–0.7)
Eosinophils Relative: 5 %
HCT: 34.8 % — ABNORMAL LOW (ref 40.0–52.0)
Hemoglobin: 11.9 g/dL — ABNORMAL LOW (ref 13.0–18.0)
LYMPHS ABS: 5.3 10*3/uL — AB (ref 1.0–3.6)
LYMPHS PCT: 45 %
MCH: 34.7 pg — AB (ref 26.0–34.0)
MCHC: 34.3 g/dL (ref 32.0–36.0)
MCV: 101.2 fL — AB (ref 80.0–100.0)
MONOS PCT: 12 %
Monocytes Absolute: 1.3 10*3/uL — ABNORMAL HIGH (ref 0.2–1.0)
NEUTROS ABS: 4.2 10*3/uL (ref 1.4–6.5)
NEUTROS PCT: 37 %
PLATELETS: 271 10*3/uL (ref 150–440)
RBC: 3.44 MIL/uL — ABNORMAL LOW (ref 4.40–5.90)
RDW: 13.8 % (ref 11.5–14.5)
WBC: 11.4 10*3/uL — ABNORMAL HIGH (ref 3.8–10.6)

## 2016-01-08 LAB — LACTATE DEHYDROGENASE: LDH: 585 U/L — ABNORMAL HIGH (ref 98–192)

## 2016-01-11 ENCOUNTER — Telehealth: Payer: Self-pay | Admitting: *Deleted

## 2016-01-11 DIAGNOSIS — C9110 Chronic lymphocytic leukemia of B-cell type not having achieved remission: Secondary | ICD-10-CM

## 2016-01-11 NOTE — Telephone Encounter (Signed)
Spoke with patient- results reviewed with patient. Pt prefers lab only apt on Friday @ 930am in [place]. Msg sent to sch. To arrange for this appt.

## 2016-01-11 NOTE — Telephone Encounter (Signed)
-----   Message from Cammie Sickle, MD sent at 01/11/2016  8:07 AM EDT ----- Please inform pt that labs- show hemoglobin is slightly low; and LDH is slightly up from 3 weeks ago; order labs [cbc/ldh/cmp] this Friday; and will recommend any further treatments if needed based on the labs.

## 2016-01-14 ENCOUNTER — Other Ambulatory Visit: Payer: Self-pay

## 2016-01-14 ENCOUNTER — Other Ambulatory Visit
Admission: RE | Admit: 2016-01-14 | Discharge: 2016-01-14 | Disposition: A | Discharge status: Home / Self Care | Payer: Medicare Other | Source: Ambulatory Visit | Attending: Family Medicine | Admitting: Family Medicine

## 2016-01-14 ENCOUNTER — Encounter: Payer: Self-pay | Admitting: Family Medicine

## 2016-01-14 ENCOUNTER — Ambulatory Visit (INDEPENDENT_AMBULATORY_CARE_PROVIDER_SITE_OTHER): Payer: Medicare Other | Admitting: Family Medicine

## 2016-01-14 VITALS — BP 120/62 | HR 88 | Temp 98.7°F

## 2016-01-14 DIAGNOSIS — K5732 Diverticulitis of large intestine without perforation or abscess without bleeding: Secondary | ICD-10-CM

## 2016-01-14 DIAGNOSIS — R109 Unspecified abdominal pain: Secondary | ICD-10-CM | POA: Diagnosis not present

## 2016-01-14 DIAGNOSIS — R509 Fever, unspecified: Secondary | ICD-10-CM | POA: Diagnosis present

## 2016-01-14 LAB — POCT URINALYSIS DIPSTICK
BILIRUBIN UA: NEGATIVE
Blood, UA: NEGATIVE
Glucose, UA: NEGATIVE
KETONES UA: NEGATIVE
Nitrite, UA: NEGATIVE
PROTEIN UA: NEGATIVE
Spec Grav, UA: 1.015
Urobilinogen, UA: 0.2
pH, UA: 5

## 2016-01-14 LAB — CBC WITH DIFFERENTIAL/PLATELET
BASOS PCT: 1 %
Basophils Absolute: 0.1 10*3/uL (ref 0–0.1)
EOS ABS: 0.8 10*3/uL — AB (ref 0–0.7)
Eosinophils Relative: 6 %
HCT: 35 % — ABNORMAL LOW (ref 40.0–52.0)
Hemoglobin: 12.1 g/dL — ABNORMAL LOW (ref 13.0–18.0)
Lymphocytes Relative: 51 %
Lymphs Abs: 6.1 10*3/uL — ABNORMAL HIGH (ref 1.0–3.6)
MCH: 35.2 pg — ABNORMAL HIGH (ref 26.0–34.0)
MCHC: 34.5 g/dL (ref 32.0–36.0)
MCV: 101.9 fL — ABNORMAL HIGH (ref 80.0–100.0)
MONO ABS: 1.9 10*3/uL — AB (ref 0.2–1.0)
MONOS PCT: 15 %
Neutro Abs: 3.2 10*3/uL (ref 1.4–6.5)
Neutrophils Relative %: 27 %
Platelets: 347 10*3/uL (ref 150–440)
RBC: 3.44 MIL/uL — ABNORMAL LOW (ref 4.40–5.90)
RDW: 14.4 % (ref 11.5–14.5)
WBC: 12.2 10*3/uL — ABNORMAL HIGH (ref 3.8–10.6)

## 2016-01-14 LAB — COMPREHENSIVE METABOLIC PANEL
ALBUMIN: 4.1 g/dL (ref 3.5–5.0)
ALT: 19 U/L (ref 17–63)
ANION GAP: 7 (ref 5–15)
AST: 45 U/L — ABNORMAL HIGH (ref 15–41)
Alkaline Phosphatase: 61 U/L (ref 38–126)
BILIRUBIN TOTAL: 1 mg/dL (ref 0.3–1.2)
BUN: 21 mg/dL — ABNORMAL HIGH (ref 6–20)
CO2: 27 mmol/L (ref 22–32)
Calcium: 9.2 mg/dL (ref 8.9–10.3)
Chloride: 104 mmol/L (ref 101–111)
Creatinine, Ser: 1.01 mg/dL (ref 0.61–1.24)
GFR calc Af Amer: >60 mL/min (ref >60)
GFR calc non Af Amer: >60 mL/min (ref >60)
GLUCOSE: 124 mg/dL — AB (ref 65–99)
POTASSIUM: 4.6 mmol/L (ref 3.5–5.1)
SODIUM: 138 mmol/L (ref 135–145)
TOTAL PROTEIN: 6.3 g/dL — AB (ref 6.5–8.1)

## 2016-01-14 LAB — LACTATE DEHYDROGENASE: LDH: 543 U/L — ABNORMAL HIGH (ref 98–192)

## 2016-01-14 MED ORDER — LEVOFLOXACIN 750 MG PO TABS: 750.0000 mg | ORAL_TABLET | Freq: Every day | ORAL | 0 refills | Status: AC

## 2016-01-14 NOTE — Progress Notes (Signed)
Name: [name] [name]   MRN: [id number]    DOB: [birth year]   Date:01/15/2016       Progress Note  Subjective  Chief Complaint  Chief Complaint  Patient presents with  . Follow-up    abdominal pain- still running fever    Fever   This is a new problem. The current episode started 1 to 4 weeks ago. The problem occurs intermittently. The problem has been waxing and waning. The maximum temperature noted was 100 to 100.9 F. The temperature was taken using a tympanic thermometer. Associated symptoms include abdominal pain. Pertinent negatives include no chest pain, congestion, coughing, diarrhea, ear pain, headaches, muscle aches, nausea, rash, sleepiness, sore throat, urinary pain, vomiting or wheezing. He has tried nothing for the symptoms. The treatment provided mild relief.  Risk factors: recent sickness   Risk factors: no hx of cancer and no sick contacts   Risk factors comment:  Diverticulitis   No problem-specific Assessment & Plan notes found for this encounter.   Past Medical History:  Diagnosis Date  . Abdominal pain, epigastric   . Aortic insufficiency 2009   mild, Mild, echo, 2012  . Atrial septal aneurysm 2009   Insignificant, no, January, 2009  . Bradycardia    Sinus bradycardia, old  . Calculus of kidney   . Cellulitis   . Chest pain    EF 55%, echo, October, 2009, possible inferior and posterior borderline hypokinesis, patient has never  had cardiac catheterization  . Chronic diastolic CHF (congestive heart failure) ([place]) 03/17/2015  . CLL (chronic lymphoblastic leukemia) 2006   Rituxan treatment 2006  . Diastolic dysfunction    mild  . Dyslipidemia    statins not used due to LFT abnormalities  . Edema    EF 55%, echo, October, 2009, possible borderline inferior and posterior hypokinesis... heart catheterization has never been done(July 10, 2009)  . Edema of male genital organs    Resolved, occurred during illness related to myositis  . Ejection fraction    EF 60%,  echo, October, 2012  . Febrile illness    May, 2013, question sinusitis  . Feeding difficulties and mismanagement   . Hemolytic anemia ([place])   . Herpes zoster    right chest  . Infection of PEG site (HCC)    cellulitis.Marland KitchenResolved  . Inguinal hernia without mention of obstruction or gangrene, unilateral or unspecified, (not specified as recurrent)    right   . Insomnia   . LFT elevation    Related to Imuran in the past... dose was adjusted  . Mitral regurgitation    Mild / moderate, echo, 2012  . Myositis    Caused pulmonary insufficiency with muscle weakness in the past / Dr.Willis-neurology, Imuran and prednisone  . Nonspecific elevation of levels of transaminase or lactic acid dehydrogenase (LDH)   . Polymyositis ([place]) 11/26/2012  . RBBB (right bundle branch block)    Old  . Right ventricular dysfunction    Mild, echo, 2012  . Shortness of breath    Breathing abnormalities related to muscle weakness from myositis  . Swelling of arm    left.Marland KitchenMarland KitchenResolved.... no DVT  . Unspecified gastritis and gastroduodenitis without mention of hemorrhage   . Vertebral compression fracture (HCC)    multiple levels    Past Surgical History:  Procedure Laterality Date  . CATARACT EXTRACTION     bi lateral  . HERNIA REPAIR     left side  . KYPHOSIS SURGERY     multilevel vertebral  .  NASAL SINUS SURGERY     x2  . ODONTOID FRACTURE SURGERY     anterior screw fixation  . PEG PLACEMENT  06/2007  . PEG TUBE REMOVAL  12/2007  . SPLENECTOMY    . TEE WITHOUT CARDIOVERSION N/A 07/01/2015   Procedure: TRANSESOPHAGEAL ECHOCARDIOGRAM (TEE);  Surgeon: Dixie Dials, MD;  Location: [facility] ENDOSCOPY;  Service: Cardiovascular;  Laterality: N/A;    Family History  Problem Relation Age of Onset  . Stroke Mother   . Hypertension Mother   . Emphysema Father   . Breast cancer Sister   . Colon cancer Brother   . Prostate cancer Brother   . Diabetes Grandchild   . Cancer Brother   . Cancer Sister   .  Cancer Daughter   . Heart attack Neg Hx     Social History   Social History  . Marital status: Widowed    Spouse name: N/A  . Number of children: 4  . Years of education: college   Occupational History  . retired    Social History Main Topics  . Smoking status: Former Smoker    Packs/day: 0.50    Years: 31.00    Types: Cigarettes    Quit date: 06/29/1968  . Smokeless tobacco: Former Systems developer    Types: Chew  . Alcohol use No     Comment: rarely  . Drug use: No  . Sexual activity: No   Other Topics Concern  . Not on file   Social History Narrative   Patient is widowed and lives alone.   Patient is retired.   Patient has four adult children.   Patient has a college degree.   Patient is right-handed.   Patient drinks one cup of coffee daily.    Allergies  Allergen Reactions  . Sulfonamide Derivatives Itching and Rash     Review of Systems  Constitutional: Positive for fever. Negative for chills, malaise/fatigue and weight loss.  HENT: Negative for congestion, ear discharge, ear pain and sore throat.   Eyes: Negative for blurred vision.  Respiratory: Negative for cough, sputum production, shortness of breath and wheezing.   Cardiovascular: Negative for chest pain, palpitations and leg swelling.  Gastrointestinal: Positive for abdominal pain. Negative for blood in stool, constipation, diarrhea, heartburn, melena, nausea and vomiting.  Genitourinary: Negative for dysuria, frequency, hematuria and urgency.  Musculoskeletal: Negative for back pain, joint pain, myalgias and neck pain.  Skin: Negative for rash.  Neurological: Negative for dizziness, tingling, sensory change, focal weakness and headaches.  Endo/Heme/Allergies: Negative for environmental allergies and polydipsia. Does not bruise/bleed easily.  Psychiatric/Behavioral: Negative for depression and suicidal ideas. The patient is not nervous/anxious and does not have insomnia.      Objective  Vitals:    01/14/16 1608  BP: 120/62  Pulse: 88  Temp: 98.7 F (37.1 C)    Physical Exam  Constitutional: He is oriented to person, place, and time and well-developed, well-nourished, and in no distress.  HENT:  Head: Normocephalic.  Right Ear: External ear normal.  Left Ear: External ear normal.  Nose: Nose normal.  Mouth/Throat: Oropharynx is clear and moist.  Eyes: Conjunctivae and EOM are normal. Pupils are equal, round, and reactive to light. Right eye exhibits no discharge. Left eye exhibits no discharge. No scleral icterus.  Neck: Normal range of motion. Neck supple. No JVD present. No tracheal deviation present. No thyromegaly present.  Cardiovascular: Normal rate, regular rhythm, normal heart sounds and intact distal pulses.  Exam reveals no gallop and  no friction rub.   No murmur heard. Pulmonary/Chest: Breath sounds normal. No respiratory distress. He has no wheezes. He has no rales.  Abdominal: Soft. Bowel sounds are normal. He exhibits no mass. There is no hepatosplenomegaly. There is tenderness in the left lower quadrant. There is no rebound, no guarding and no CVA tenderness.  Musculoskeletal: Normal range of motion. He exhibits no edema or tenderness.  Lymphadenopathy:    He has no cervical adenopathy.  Neurological: He is alert and oriented to person, place, and time. He has normal sensation, normal strength, normal reflexes and intact cranial nerves. No cranial nerve deficit.  Skin: Skin is warm. No rash noted.  Psychiatric: Mood and affect normal.  Nursing note and vitals reviewed.     Assessment & Plan  Problem List Items Addressed This Visit    None    Visit Diagnoses    Diverticulitis of large intestine without bleeding, unspecified complication status    -  Primary   Relevant Medications   levofloxacin (LEVAQUIN) 750 MG tablet   Other Relevant Orders   CT Abdomen Pelvis W Contrast   POCT urinalysis dipstick (Completed)        Dr. Macon Large  [facility]  01/15/16

## 2016-01-15 ENCOUNTER — Other Ambulatory Visit: Payer: Medicare Other

## 2016-01-15 NOTE — Progress Notes (Signed)
x

## 2016-01-18 ENCOUNTER — Ambulatory Visit: Payer: Medicare Other | Admitting: Family Medicine

## 2016-01-18 ENCOUNTER — Other Ambulatory Visit: Payer: Self-pay

## 2016-01-19 ENCOUNTER — Ambulatory Visit
Admission: RE | Admit: 2016-01-19 | Discharge: 2016-01-19 | Disposition: A | Discharge status: Home / Self Care | Payer: Medicare Other | Source: Ambulatory Visit | Attending: Family Medicine | Admitting: Family Medicine

## 2016-01-19 ENCOUNTER — Other Ambulatory Visit (INDEPENDENT_AMBULATORY_CARE_PROVIDER_SITE_OTHER): Payer: Medicare Other

## 2016-01-19 DIAGNOSIS — K5730 Diverticulosis of large intestine without perforation or abscess without bleeding: Secondary | ICD-10-CM | POA: Insufficient documentation

## 2016-01-19 DIAGNOSIS — K5732 Diverticulitis of large intestine without perforation or abscess without bleeding: Secondary | ICD-10-CM | POA: Insufficient documentation

## 2016-01-19 DIAGNOSIS — Z9081 Acquired absence of spleen: Secondary | ICD-10-CM | POA: Insufficient documentation

## 2016-01-19 DIAGNOSIS — K439 Ventral hernia without obstruction or gangrene: Secondary | ICD-10-CM | POA: Insufficient documentation

## 2016-01-19 DIAGNOSIS — K5792 Diverticulitis of intestine, part unspecified, without perforation or abscess without bleeding: Secondary | ICD-10-CM | POA: Diagnosis not present

## 2016-01-19 DIAGNOSIS — I77819 Aortic ectasia, unspecified site: Secondary | ICD-10-CM | POA: Diagnosis not present

## 2016-01-19 LAB — HEMOCCULT GUIAC POC 1CARD (OFFICE)
FECAL OCCULT BLD: NEGATIVE
FECAL OCCULT BLD: NEGATIVE
Fecal Occult Blood, POC: NEGATIVE

## 2016-01-19 MED ORDER — IOPAMIDOL (ISOVUE-300) INJECTION 61%
100.0000 mL | Freq: Once | INTRAVENOUS | Status: AC | PRN
  Administered 2016-01-19: 100 mL via INTRAVENOUS

## 2016-01-28 ENCOUNTER — Emergency Department
Admission: EM | Admit: 2016-01-28 | Discharge: 2016-02-10 | Disposition: E | Payer: Medicare Other | Attending: Emergency Medicine | Admitting: Emergency Medicine

## 2016-01-28 ENCOUNTER — Emergency Department: Payer: Medicare Other

## 2016-01-28 ENCOUNTER — Encounter: Payer: Self-pay | Admitting: Emergency Medicine

## 2016-01-28 DIAGNOSIS — Z79899 Other long term (current) drug therapy: Secondary | ICD-10-CM | POA: Insufficient documentation

## 2016-01-28 DIAGNOSIS — R6521 Severe sepsis with septic shock: Secondary | ICD-10-CM | POA: Diagnosis not present

## 2016-01-28 DIAGNOSIS — R001 Bradycardia, unspecified: Secondary | ICD-10-CM

## 2016-01-28 DIAGNOSIS — Z87891 Personal history of nicotine dependence: Secondary | ICD-10-CM | POA: Diagnosis not present

## 2016-01-28 DIAGNOSIS — I2510 Atherosclerotic heart disease of native coronary artery without angina pectoris: Secondary | ICD-10-CM | POA: Insufficient documentation

## 2016-01-28 DIAGNOSIS — J189 Pneumonia, unspecified organism: Secondary | ICD-10-CM

## 2016-01-28 DIAGNOSIS — J9601 Acute respiratory failure with hypoxia: Secondary | ICD-10-CM | POA: Insufficient documentation

## 2016-01-28 DIAGNOSIS — J441 Chronic obstructive pulmonary disease with (acute) exacerbation: Secondary | ICD-10-CM | POA: Insufficient documentation

## 2016-01-28 DIAGNOSIS — R0902 Hypoxemia: Secondary | ICD-10-CM | POA: Diagnosis not present

## 2016-01-28 DIAGNOSIS — R0602 Shortness of breath: Secondary | ICD-10-CM | POA: Diagnosis present

## 2016-01-28 DIAGNOSIS — A419 Sepsis, unspecified organism: Secondary | ICD-10-CM | POA: Insufficient documentation

## 2016-01-28 DIAGNOSIS — I5032 Chronic diastolic (congestive) heart failure: Secondary | ICD-10-CM | POA: Diagnosis not present

## 2016-01-28 LAB — CBC WITH DIFFERENTIAL/PLATELET
BAND NEUTROPHILS: 13 %
BASOS PCT: 0 %
Basophils Absolute: 0 10*3/uL (ref 0–0.1)
Blasts: 0 %
EOS ABS: 0 10*3/uL (ref 0–0.7)
Eosinophils Relative: 0 %
HEMATOCRIT: 31 % — AB (ref 40.0–52.0)
HEMOGLOBIN: 9.8 g/dL — AB (ref 13.0–18.0)
LYMPHS PCT: 28 %
Lymphs Abs: 3.8 10*3/uL — ABNORMAL HIGH (ref 1.0–3.6)
MCH: 36 pg — AB (ref 26.0–34.0)
MCHC: 31.8 g/dL — AB (ref 32.0–36.0)
MCV: 113.4 fL — ABNORMAL HIGH (ref 80.0–100.0)
MONO ABS: 0.3 10*3/uL (ref 0.2–1.0)
MONOS PCT: 2 %
Metamyelocytes Relative: 1 %
Myelocytes: 0 %
NEUTROS ABS: 9.6 10*3/uL — AB (ref 1.4–6.5)
NEUTROS PCT: 56 %
NRBC: 3 /100{WBCs} — AB
OTHER: 0 %
Platelets: 89 10*3/uL — ABNORMAL LOW (ref 150–440)
Promyelocytes Absolute: 0 %
RBC: 2.73 MIL/uL — ABNORMAL LOW (ref 4.40–5.90)
RDW: 16.5 % — AB (ref 11.5–14.5)
WBC: 13.7 10*3/uL — ABNORMAL HIGH (ref 3.8–10.6)

## 2016-01-28 LAB — COMPREHENSIVE METABOLIC PANEL
ALBUMIN: 3.1 g/dL — AB (ref 3.5–5.0)
ALK PHOS: 89 U/L (ref 38–126)
ALT: 26 U/L (ref 17–63)
ANION GAP: 16 — AB (ref 5–15)
AST: 81 U/L — ABNORMAL HIGH (ref 15–41)
BILIRUBIN TOTAL: 3.3 mg/dL — AB (ref 0.3–1.2)
BUN: 33 mg/dL — ABNORMAL HIGH (ref 6–20)
CALCIUM: 8 mg/dL — AB (ref 8.9–10.3)
CO2: 15 mmol/L — ABNORMAL LOW (ref 22–32)
Chloride: 111 mmol/L (ref 101–111)
Creatinine, Ser: 2.49 mg/dL — ABNORMAL HIGH (ref 0.61–1.24)
GFR calc non Af Amer: 22 mL/min — ABNORMAL LOW (ref >60)
GFR, EST AFRICAN AMERICAN: 26 mL/min — AB (ref >60)
GLUCOSE: 91 mg/dL (ref 65–99)
POTASSIUM: 3.8 mmol/L (ref 3.5–5.1)
SODIUM: 142 mmol/L (ref 135–145)
TOTAL PROTEIN: 4.8 g/dL — AB (ref 6.5–8.1)

## 2016-01-28 LAB — BLOOD CULTURE ID PANEL (REFLEXED)
ACINETOBACTER BAUMANNII: NOT DETECTED
CANDIDA ALBICANS: NOT DETECTED
CANDIDA GLABRATA: NOT DETECTED
CANDIDA KRUSEI: NOT DETECTED
CANDIDA TROPICALIS: NOT DETECTED
Candida parapsilosis: NOT DETECTED
ENTEROBACTER CLOACAE COMPLEX: NOT DETECTED
ENTEROBACTERIACEAE SPECIES: NOT DETECTED
ESCHERICHIA COLI: NOT DETECTED
Enterococcus species: NOT DETECTED
Haemophilus influenzae: NOT DETECTED
KLEBSIELLA PNEUMONIAE: NOT DETECTED
Klebsiella oxytoca: NOT DETECTED
Listeria monocytogenes: NOT DETECTED
NEISSERIA MENINGITIDIS: NOT DETECTED
PSEUDOMONAS AERUGINOSA: NOT DETECTED
Proteus species: NOT DETECTED
STREPTOCOCCUS AGALACTIAE: NOT DETECTED
STREPTOCOCCUS PNEUMONIAE: DETECTED — AB
Serratia marcescens: NOT DETECTED
Staphylococcus aureus (BCID): NOT DETECTED
Staphylococcus species: NOT DETECTED
Streptococcus pyogenes: NOT DETECTED
Streptococcus species: DETECTED — AB

## 2016-01-28 LAB — PROTIME-INR
INR: 1.83
PROTHROMBIN TIME: 21.4 s — AB (ref 11.4–15.2)

## 2016-01-28 LAB — APTT: APTT: 80 s — AB (ref 24–36)

## 2016-01-28 LAB — LACTIC ACID, PLASMA: LACTIC ACID, VENOUS: 9.4 mmol/L — AB (ref 0.5–1.9)

## 2016-01-28 LAB — TROPONIN I: TROPONIN I: 0.05 ng/mL — AB (ref <0.03)

## 2016-01-28 LAB — BRAIN NATRIURETIC PEPTIDE: B NATRIURETIC PEPTIDE 5: 481 pg/mL — AB (ref 0.0–100.0)

## 2016-01-28 MED ORDER — VANCOMYCIN HCL IN DEXTROSE 1-5 GM/200ML-% IV SOLN
1000.0000 mg | Freq: Once | INTRAVENOUS | Status: AC
  Administered 2016-01-28: 1000 mg via INTRAVENOUS
  Filled 2016-01-28: qty 200

## 2016-01-28 MED ORDER — MORPHINE SULFATE (PF) 2 MG/ML IV SOLN
INTRAVENOUS | Status: AC
  Administered 2016-01-28: 4 mg via INTRAMUSCULAR
  Filled 2016-01-28: qty 2

## 2016-01-28 MED ORDER — SODIUM CHLORIDE 0.9 % IV SOLN: 5.0000 mg/h | INTRAVENOUS | Status: DC

## 2016-01-28 MED ORDER — ONDANSETRON HCL 4 MG/2ML IJ SOLN
INTRAMUSCULAR | Status: AC
  Administered 2016-01-28: 4 mg
  Filled 2016-01-28: qty 2

## 2016-01-28 MED ORDER — PIPERACILLIN-TAZOBACTAM 3.375 G IVPB 30 MIN
3.3750 g | Freq: Once | INTRAVENOUS | Status: AC
  Administered 2016-01-28: 3.375 g via INTRAVENOUS
  Filled 2016-01-28: qty 50

## 2016-01-28 MED ORDER — SODIUM CHLORIDE 0.9 % IV BOLUS (SEPSIS)
1000.0000 mL | Freq: Once | INTRAVENOUS | Status: AC
  Administered 2016-01-28: 1000 mL via INTRAVENOUS

## 2016-01-28 MED ORDER — MORPHINE 100MG IN NS 100ML (1MG/ML) PREMIX INFUSION
5.0000 mg/h | INTRAVENOUS | Status: DC
  Filled 2016-01-28: qty 100

## 2016-01-29 ENCOUNTER — Inpatient Hospital Stay: Payer: Medicare Other

## 2016-01-31 LAB — CULTURE, BLOOD (ROUTINE X 2)

## 2016-02-01 LAB — BLOOD GAS, ARTERIAL
ACID-BASE DEFICIT: 17.8 mmol/L — AB (ref 0.0–2.0)
Allens test (pass/fail): POSITIVE — AB
BICARBONATE: 10 mmol/L — AB (ref 20.0–28.0)
O2 SAT: 77.8 %
PATIENT TEMPERATURE: 37
PCO2 ART: 30 mmHg — AB (ref 32.0–48.0)
PO2 ART: 57 mmHg — AB (ref 83.0–108.0)
pH, Arterial: 7.13 — CL (ref 7.350–7.450)

## 2016-02-10 DIAGNOSIS — 419620001 Death: Secondary | SNOMED CT | POA: Diagnosis not present

## 2016-02-10 NOTE — ED Notes (Signed)
Patient denies pain and is resting comfortably.  

## 2016-02-10 NOTE — ED Notes (Signed)
Troponin of 0.05 and lactic acid of 9.4 reported verbally to Dr. Corky Downs.

## 2016-02-10 NOTE — ED Notes (Signed)
Family given condolence tray and still remains at bedside.

## 2016-02-10 NOTE — ED Notes (Signed)
Introduced self to family, informed them that [place] had not arrived yet.  Pts family verbalized no needs at this time.

## 2016-02-10 NOTE — ED Triage Notes (Signed)
Pt to ed via ems from home with reports of shortness of breath, fever and back pain. Ems reports pt took tylenol x4 last night for fever. Pt a/ox3 on arrival to ed with NAD noted. edp and respiratory at bedside. Ems reports cbg 122, bp 77/38 in route.

## 2016-02-10 NOTE — Consult Note (Addendum)
PULMONARY / CRITICAL CARE MEDICINE   Name: [name] [name] MRN: [id number] DOB: 27-Sep-1929    ADMISSION DATE:  02/14/16 CONSULTATION DATE:  02/14/16  REFERRING MD :  Dr. Corky Downs [facility] ED)   CHIEF COMPLAINT:   Hypoxia, sepsis     HISTORY OF PRESENT ILLNESS   82 yo Medical history of CLL, hemolytic anemia, congestive heart failure, complex medical history, coronary artery disease, stented to the ER with acute shortness of breath, generalized body aches and abdominal pain. I was called by the ER physician for hypoxia, and patient requiring BiPAP. The time of my arrival to the patient room, multiple family members were in the room including healthcare power of attorney which included son and 2 daughters. They stated the patient had a DO NOT RESUSCITATE. While examining the patient, patient noted to adjust himself had a cough, was comfortable on the BiPAP had a pulse. However, within 2 minutes of examining the patient and talking to the family the same time patient started to bradycardia down and became unresponsive, family instituted DO NOT RESUSCITATE and activated comfort measures. Note, family stated patient had complaints of abdominal pain about 2 weeks ago this was followed up with a CT of the abdomen, that was negative for diverticulitis per family.    SIGNIFICANT EVENTS    10/19>Hypoxia, requiring intermittent BiPAP, acute respiratory distress followed by bradycardia made comfort care  PAST MEDICAL HISTORY    :  Past Medical History:  Diagnosis Date  . Abdominal pain, epigastric   . Aortic insufficiency 2009   mild, Mild, echo, 2012  . Atrial septal aneurysm 2009   Insignificant, no, January, 2009  . Bradycardia    Sinus bradycardia, old  . Calculus of kidney   . Cellulitis   . Chest pain    EF 55%, echo, October, 2009, possible inferior and posterior borderline hypokinesis, patient has never  had cardiac catheterization  . CLL (chronic lymphoblastic leukemia) 2006    Rituxan treatment 2006  . Diastolic dysfunction    mild  . Dyslipidemia    statins not used due to LFT abnormalities  . Edema    EF 55%, echo, October, 2009, possible borderline inferior and posterior hypokinesis... heart catheterization has never been done(July 10, 2009)  . Edema of male genital organs    Resolved, occurred during illness related to myositis  . Ejection fraction    EF 60%, echo, October, 2012  . Febrile illness    May, 2013, question sinusitis  . Feeding difficulties and mismanagement   . Hemolytic anemia ([place])   . Herpes zoster    right chest  . Infection of PEG site (HCC)    cellulitis.Marland KitchenResolved  . Inguinal hernia without mention of obstruction or gangrene, unilateral or unspecified, (not specified as recurrent)    right   . Insomnia   . LFT elevation    Related to Imuran in the past... dose was adjusted  . Mitral regurgitation    Mild / moderate, echo, 2012  . Myositis    Caused pulmonary insufficiency with muscle weakness in the past / Dr.Willis-neurology, Imuran and prednisone  . Nonspecific elevation of levels of transaminase or lactic acid dehydrogenase (LDH)   . Polymyositis ([place]) 11/26/2012  . RBBB (right bundle branch block)    Old  . Right ventricular dysfunction    Mild, echo, 2012  . Shortness of breath    Breathing abnormalities related to muscle weakness from myositis  . Swelling of arm    left.Marland KitchenMarland KitchenResolved.... no DVT  .  Unspecified gastritis and gastroduodenitis without mention of hemorrhage   . Vertebral compression fracture (HCC)    multiple levels   Past Surgical History:  Procedure Laterality Date  . CATARACT EXTRACTION     bi lateral  . HERNIA REPAIR     left side  . KYPHOSIS SURGERY     multilevel vertebral  . NASAL SINUS SURGERY     x2  . ODONTOID FRACTURE SURGERY     anterior screw fixation  . PEG PLACEMENT  06/2007  . PEG TUBE REMOVAL  12/2007  . SPLENECTOMY    . TEE WITHOUT CARDIOVERSION N/A 07/01/2015   Procedure:  TRANSESOPHAGEAL ECHOCARDIOGRAM (TEE);  Surgeon: Dixie Dials, MD;  Location: [facility] ENDOSCOPY;  Service: Cardiovascular;  Laterality: N/A;   Prior to Admission medications   Medication Sig Start Date End Date Taking? Authorizing Provider  acyclovir (ZOVIRAX) 200 MG capsule Take 1 capsule (200 mg total) by mouth 2 (two) times daily. 07/28/15  Yes Cammie Sickle, MD  albuterol (PROVENTIL) (2.5 MG/3ML) 0.083% nebulizer solution Take 3 mLs (2.5 mg total) by nebulization every 6 (six) hours as needed for wheezing or shortness of breath. 01/06/15  Yes Juline Patch, MD  atorvastatin (LIPITOR) 10 MG tablet Take 1 tablet (10 mg total) by mouth daily. 09/25/15  Yes Minna Merritts, MD  calcium-vitamin D (CALCIUM 500+D) 500-200 MG-UNIT per tablet Take 1 tablet by mouth 2 (two) times daily.     Yes Historical Provider, MD  Cholecalciferol (VITAMIN D3) 1000 UNITS tablet Take 1,000 Units by mouth daily.     Yes Historical Provider, MD  citalopram (CELEXA) 40 MG tablet Take 1 tablet (40 mg total) by mouth daily. 11/09/15  Yes Juline Patch, MD  cyanocobalamin (,VITAMIN B-12,) 1000 MCG/ML injection Inject 1 mL (1,000 mcg total) into the muscle every 14 (fourteen) days. Please dispense 10 ml vial to patient. 08/17/15  Yes Cammie Sickle, MD  ferrous sulfate 325 (65 FE) MG tablet Take 325 mg by mouth daily with breakfast.     Yes Historical Provider, MD  fish oil-omega-3 fatty acids 1000 MG capsule Take 1 g by mouth daily at 12 noon.    Yes Historical Provider, MD  folic acid (FOLVITE) 1 MG tablet Take 4 tablets (4 mg total) by mouth daily. 10/14/14  Yes Shanker Kristeen Mans, MD  furosemide (LASIX) 80 MG tablet Take 1 tablet (80 mg total) by mouth daily. Patient taking differently: Take 40 mg by mouth daily.  03/17/15  Yes Minna Merritts, MD  montelukast (SINGULAIR) 10 MG tablet Take 1 tablet (10 mg total) by mouth daily. 11/09/15  Yes Juline Patch, MD  Multiple Vitamin (MULTIVITAMIN) capsule Take 1 capsule by  mouth daily.     Yes Historical Provider, MD  mycophenolate (CELLCEPT) 500 MG tablet Take 1 tablet (500 mg total) by mouth daily. 06/18/15  Yes Kathrynn Ducking, MD  nystatin cream (MYCOSTATIN) APPLY TOPICALLY TWICE DAILY 01/12/15  Yes Juline Patch, MD  potassium chloride SA (K-DUR,KLOR-CON) 20 MEQ tablet Take 1 tablet (20 mEq total) by mouth 2 (two) times daily. 03/17/15  Yes Minna Merritts, MD  PROAIR HFA 108 (90 BASE) MCG/ACT inhaler Inhale 2 puffs into the lungs every 6 (six) hours as needed for shortness of breath.  08/21/14  Yes Historical Provider, MD  temazepam (RESTORIL) 15 MG capsule Take 1 capsule (15 mg total) by mouth at bedtime as needed for sleep (1-2 q hs). 11/09/15  Yes Juline Patch, MD  levofloxacin (LEVAQUIN) 750 MG tablet Take 1 tablet (750 mg total) by mouth daily. Patient not taking: Reported on February 19, 2016 01/14/16   Juline Patch, MD  metroNIDAZOLE (FLAGYL) 500 MG tablet Take 1 tablet (500 mg total) by mouth 3 (three) times daily. Patient not taking: Reported on February 19, 2016 01/06/16   Juline Patch, MD  Resource Beneprotein PACK Take 3 each by mouth 3 (three) times daily. Take 3g three times a day    Historical Provider, MD   Allergies  Allergen Reactions  . Sulfonamide Derivatives Itching and Rash     FAMILY HISTORY   Family History  Problem Relation Age of Onset  . Stroke Mother   . Hypertension Mother   . Emphysema Father   . Breast cancer Sister   . Colon cancer Brother   . Prostate cancer Brother   . Diabetes Grandchild   . Cancer Brother   . Cancer Sister   . Cancer Daughter   . Heart attack Neg Hx       SOCIAL HISTORY    reports that he quit smoking about 47 years ago. His smoking use included Cigarettes. He has a 15.50 pack-year smoking history. He has quit using smokeless tobacco. His smokeless tobacco use included Chew. He reports that he does not drink alcohol or use drugs.  Review of Systems  Unable to perform ROS: Critical illness       VITAL SIGNS    Temp:  [97.9 F (36.6 C)] 97.9 F (36.6 C) (10/19 0802) Pulse Rate:  [71-96] 96 (10/19 0915) Resp:  [21-33] 28 (10/19 0915) BP: (73-103)/(43-69) 103/60 (10/19 0915) SpO2:  [94 %-100 %] 98 % (10/19 0915) Weight:  [130 lb (59 kg)] 130 lb (59 kg) (10/19 0805) HEMODYNAMICS:   VENTILATOR SETTINGS:   INTAKE / OUTPUT:  Intake/Output Summary (Last 24 hours) at 02-19-2016 0943 Last data filed at Feb 19, 2016 0916  Gross per 24 hour  Intake             1050 ml  Output                0 ml  Net             1050 ml       PHYSICAL EXAM   Physical Exam  HENT:  Head: Atraumatic.  Right Ear: External ear normal.  Left Ear: External ear normal.  Neck: Normal range of motion. Neck supple.  Cardiovascular:  Acute bradycardia  Pulmonary/Chest:  Prior to bradycardia, shallow breath sounds, no wheezes  Abdominal: Soft. Bowel sounds are normal. He exhibits no distension.  Neurological:  Lethargic, acutely unresponsive during physical exam  Skin: Skin is dry.  Nursing note and vitals reviewed.      LABS   LABS:  CBC  Recent Labs Lab 02-19-16 0804  WBC 13.7*  HGB 9.8*  HCT 31.0*  PLT 89*   Coag's  Recent Labs Lab 02/19/2016 0804  APTT 80*  INR 1.83   BMET  Recent Labs Lab Feb 19, 2016 0804  NA 142  K 3.8  CL 111  CO2 15*  BUN 33*  CREATININE 2.49*  GLUCOSE 91   Electrolytes  Recent Labs Lab Feb 19, 2016 0804  CALCIUM 8.0*   Sepsis Markers  Recent Labs Lab 02-19-2016 0804  LATICACIDVEN 9.4*   ABG  Recent Labs Lab 02/19/16 0836  PHART PENDING  PCO2ART 30*  PO2ART 57*   Liver Enzymes  Recent Labs Lab February 19, 2016 0804  AST 81*  ALT 26  ALKPHOS 89  BILITOT 3.3*  ALBUMIN 3.1*   Cardiac Enzymes  Recent Labs Lab 02/10/16 0804  TROPONINI 0.05*   Glucose No results for input(s): GLUCAP in the last 168 hours.   No results found for this or any previous visit (from the past 240 hour(s)).   Current Facility-Administered  Medications:  .  morphine 250 mg in sodium chloride 0.9 % 250 mL (1 mg/mL) infusion, 5 mg/hr, Intravenous, Continuous, Lavonia Drafts, MD .  vancomycin (VANCOCIN) IVPB 1000 mg/200 mL premix, 1,000 mg, Intravenous, Once, Lavonia Drafts, MD, Last Rate: 200 mL/hr at 2016/02/10 0915, 1,000 mg at February 10, 2016 0915  Current Outpatient Prescriptions:  .  acyclovir (ZOVIRAX) 200 MG capsule, Take 1 capsule (200 mg total) by mouth 2 (two) times daily., Disp: 60 capsule, Rfl: 12 .  albuterol (PROVENTIL) (2.5 MG/3ML) 0.083% nebulizer solution, Take 3 mLs (2.5 mg total) by nebulization every 6 (six) hours as needed for wheezing or shortness of breath., Disp: 75 mL, Rfl: 12 .  atorvastatin (LIPITOR) 10 MG tablet, Take 1 tablet (10 mg total) by mouth daily., Disp: 30 tablet, Rfl: 11 .  calcium-vitamin D (CALCIUM 500+D) 500-200 MG-UNIT per tablet, Take 1 tablet by mouth 2 (two) times daily.  , Disp: , Rfl:  .  Cholecalciferol (VITAMIN D3) 1000 UNITS tablet, Take 1,000 Units by mouth daily.  , Disp: , Rfl:  .  citalopram (CELEXA) 40 MG tablet, Take 1 tablet (40 mg total) by mouth daily., Disp: 90 tablet, Rfl: 1 .  cyanocobalamin (,VITAMIN B-12,) 1000 MCG/ML injection, Inject 1 mL (1,000 mcg total) into the muscle every 14 (fourteen) days. Please dispense 10 ml vial to patient., Disp: 10 mL, Rfl: 6 .  ferrous sulfate 325 (65 FE) MG tablet, Take 325 mg by mouth daily with breakfast.  , Disp: , Rfl:  .  fish oil-omega-3 fatty acids 1000 MG capsule, Take 1 g by mouth daily at 12 noon. , Disp: , Rfl:  .  folic acid (FOLVITE) 1 MG tablet, Take 4 tablets (4 mg total) by mouth daily., Disp: 120 tablet, Rfl: 0 .  furosemide (LASIX) 80 MG tablet, Take 1 tablet (80 mg total) by mouth daily. (Patient taking differently: Take 40 mg by mouth daily. ), Disp: 90 tablet, Rfl: 3 .  montelukast (SINGULAIR) 10 MG tablet, Take 1 tablet (10 mg total) by mouth daily., Disp: 90 tablet, Rfl: 1 .  Multiple Vitamin (MULTIVITAMIN) capsule, Take 1  capsule by mouth daily.  , Disp: , Rfl:  .  mycophenolate (CELLCEPT) 500 MG tablet, Take 1 tablet (500 mg total) by mouth daily., Disp: 90 tablet, Rfl: 3 .  nystatin cream (MYCOSTATIN), APPLY TOPICALLY TWICE DAILY, Disp: 30 g, Rfl: 0 .  potassium chloride SA (K-DUR,KLOR-CON) 20 MEQ tablet, Take 1 tablet (20 mEq total) by mouth 2 (two) times daily., Disp: 180 tablet, Rfl: 3 .  PROAIR HFA 108 (90 BASE) MCG/ACT inhaler, Inhale 2 puffs into the lungs every 6 (six) hours as needed for shortness of breath. , Disp: , Rfl: 11 .  temazepam (RESTORIL) 15 MG capsule, Take 1 capsule (15 mg total) by mouth at bedtime as needed for sleep (1-2 q hs)., Disp: 60 capsule, Rfl: 5 .  levofloxacin (LEVAQUIN) 750 MG tablet, Take 1 tablet (750 mg total) by mouth daily. (Patient not taking: Reported on 2016-02-10), Disp: 5 tablet, Rfl: 0 .  metroNIDAZOLE (FLAGYL) 500 MG tablet, Take 1 tablet (500 mg total) by mouth 3 (three) times daily. (Patient not taking: Reported on 02-10-16), Disp: 30  tablet, Rfl: 0 .  Resource Beneprotein PACK, Take 3 each by mouth 3 (three) times daily. Take 3g three times a day, Disp: , Rfl:   IMAGING    Dg Chest Portable 1 View  Result Date: 02-21-16 CLINICAL DATA:  82 year old male with a history of shortness of breath fever and back pain EXAM: PORTABLE CHEST 1 VIEW COMPARISON:  07/01/2015, 06/27/2015, CT 06/28/2015 FINDINGS: Significant right rotation the patient limits evaluation. Cardiomediastinal silhouette likely unchanged. Calcifications of the aortic arch and descending thoracic aorta. Compared to the prior plain film there has been resolution of the right basilar opacity. Coarsened interstitial markings with interlobular septal thickening. No large pleural effusion. Scarring at the apices of the lungs, similar to prior. No large confluent airspace disease. Osteopenia.  Surgical changes of multiple vertebral augmentation. IMPRESSION: Limited plain film demonstrating either chronic  changes of prior episodes of edema, and/or developing interstitial edema and CHF. No evidence of lobar pneumonia, with improvement in the right basilar airspace opacity. Aortic atherosclerosis. Signed, Dulcy Fanny. Earleen Newport, DO [facility] Electronically Signed   By: Corrie Mckusick D.O.   On: 2016/02/21 08:40      Indwelling Urinary Catheter continued, requirement due to   Reason to continue Indwelling Urinary Catheter for strict Intake/Output monitoring for hemodynamic instability   Central Line continued, requirement due to   Reason to continue [employer] Monitoring of central venous pressure or other hemodynamic parameters   Ventilator continued, requirement due to, resp failure    Ventilator Sedation RASS 0 to -2   Cultures: BCx2  UC  Sputum  Antibiotics:  Lines:   ASSESSMENT/PLAN  82 year old male past medical history of hemolytic anemia, CLL, chronic back pain, coronary artery disease, CHF, on right toxin and prednisone as an outpatient, presented with hypoxia,  Bilateral basilar pneumonia Acute respiratory failure requiring BiPAP CLL Hemolytic anemia Congestive heart failure, diastolic   Plan: -Patient is a DO NOT RESUSCITATE, he acutely had an episode of bradycardia while in the room with the family, this was precluded by a mild cough and patient acutely bradycardia, became unresponsive and family instituted and confirmed DO NOT RESUSCITATE. He was given morphine for comfort by ED physician, [place] services were also consulted. Continue comfort measures and DO NOT RESUSCITATE status per patient and family wishes     I have personally obtained a history, examined the patient, evaluated laboratory and imaging results, formulated the assessment and plan and placed orders.  Critical Care Time devoted to patient care services described in this note is 40 minutes.   Overall, patient is critically ill, prognosis  is guarded. Patient at high risk for cardiac arrest and death.   Vilinda Boehringer, MD [facility] Pager [phone] (please enter 7-digits) On Call Pager [phone] (please enter 7-digits)     02/21/16, 9:43 AM  Note: This note was prepared with Dragon dictation along with smaller phrase technology. Any transcriptional errors that result from this process are unintentional.

## 2016-02-10 NOTE — ED Provider Notes (Signed)
[facility] Emergency Department Provider Note   ____________________________________________    I have reviewed the triage vital signs and the nursing notes.   HISTORY  Chief Complaint Respiratory Distress     HPI [name] [name] is a 82 y.o. male who presents via EMS with shortness of breath. Patient reports over the last several days he has developed gradually worsening shortness of breath. He denies chest pain. He denies fevers or chills. He complains of moderate to severe pain in his mid back which he describes as near his kidneys. His medical history is extensive, he is on eliquis for pulmonary emboli, he is also on CellCept and prednisone for polymyositis and he has CLL as well as CHF.   Past Medical History:  Diagnosis Date  . Abdominal pain, epigastric   . Aortic insufficiency 2009   mild, Mild, echo, 2012  . Atrial septal aneurysm 2009   Insignificant, no, January, 2009  . Bradycardia    Sinus bradycardia, old  . Calculus of kidney   . Cellulitis   . Chest pain    EF 55%, echo, October, 2009, possible inferior and posterior borderline hypokinesis, patient has never  had cardiac catheterization  . CLL (chronic lymphoblastic leukemia) 2006   Rituxan treatment 2006  . Diastolic dysfunction    mild  . Dyslipidemia    statins not used due to LFT abnormalities  . Edema    EF 55%, echo, October, 2009, possible borderline inferior and posterior hypokinesis... heart catheterization has never been done(July 10, 2009)  . Edema of male genital organs    Resolved, occurred during illness related to myositis  . Ejection fraction    EF 60%, echo, October, 2012  . Febrile illness    May, 2013, question sinusitis  . Feeding difficulties and mismanagement   . Hemolytic anemia ([place])   . Herpes zoster    right chest  . Infection of PEG site (HCC)    cellulitis.Marland KitchenResolved  . Inguinal hernia without mention of obstruction or gangrene, unilateral or  unspecified, (not specified as recurrent)    right   . Insomnia   . LFT elevation    Related to Imuran in the past... dose was adjusted  . Mitral regurgitation    Mild / moderate, echo, 2012  . Myositis    Caused pulmonary insufficiency with muscle weakness in the past / Dr.Willis-neurology, Imuran and prednisone  . Nonspecific elevation of levels of transaminase or lactic acid dehydrogenase (LDH)   . Polymyositis ([place]) 11/26/2012  . RBBB (right bundle branch block)    Old  . Right ventricular dysfunction    Mild, echo, 2012  . Shortness of breath    Breathing abnormalities related to muscle weakness from myositis  . Swelling of arm    left.Marland KitchenMarland KitchenResolved.... no DVT  . Unspecified gastritis and gastroduodenitis without mention of hemorrhage   . Vertebral compression fracture (HCC)    multiple levels    Patient Active Problem List   Diagnosis Date Noted  . Pain and swelling of right elbow 09/30/2015  . Pulmonary hypertension 09/25/2015  . Autoimmune hemolytic anemia ([place]) 07/21/2015  . SOB (shortness of breath) 06/28/2015  . Acute respiratory failure with hypoxia ([place]) 06/28/2015  . Dyspnea   . Hypoxia   . Chronic diastolic CHF (congestive heart failure) ([place]) 03/17/2015  . Coronary artery disease 03/17/2015  . Aortic atherosclerosis ([place]) 03/17/2015  . Chronic obstructive pulmonary disease with acute exacerbation ([place]) 01/06/2015  . Right ventricular failure ([place]) 01/06/2015  .  Chronic cellulitis 01/06/2015  . Cellulitis 11/19/2014  . Acute hemolytic anemia ([place]) 10/12/2014  . CLL (chronic lymphocytic leukemia) ([place])   . Hemolytic anemia associated with lymphoproliferative disorder ([place])   . Jaundice 10/10/2014  . Hypotension 10/10/2014  . DDD (degenerative disc disease), lumbosacral 08/27/2014  . H/O disease 08/27/2014  . Muscle ache 08/27/2014  . Raynaud's phenomenon 08/27/2014  . Special screening for malignant neoplasms, colon 08/27/2014  . Acute bronchitis 11/01/2013   . Polymyositis ([place]) 11/26/2012  . Febrile illness   . Bradycardia   . Ejection fraction   . Aortic insufficiency   . CLL (chronic lymphoblastic leukemia)   . Atrial septal aneurysm   . Vertebral compression fracture ([place])   . Herpes zoster   . Diastolic dysfunction   . Chest pain   . Edema   . Myositis   . Dyslipidemia   . Shortness of breath   . HYPOKALEMIA 01/19/2009  . Chronic lymphocytic leukemia ([place]) 08/01/2008  . Hyperlipidemia 07/22/2008  . ANEMIA 07/22/2008  . MYOSITIS 07/22/2008  . Osteoporosis 07/22/2008  . INGUINAL HERNIA, RIGHT 06/30/2008  . ABDOMINAL PAIN, EPIGASTRIC 05/29/2008  . FEEDING PROBLEM 12/24/2007  . ABNORMAL TRANSAMINASE-LFT'S 12/24/2007  . DYSPNEA 05/22/2007    Past Surgical History:  Procedure Laterality Date  . CATARACT EXTRACTION     bi lateral  . HERNIA REPAIR     left side  . KYPHOSIS SURGERY     multilevel vertebral  . NASAL SINUS SURGERY     x2  . ODONTOID FRACTURE SURGERY     anterior screw fixation  . PEG PLACEMENT  06/2007  . PEG TUBE REMOVAL  12/2007  . SPLENECTOMY    . TEE WITHOUT CARDIOVERSION N/A 07/01/2015   Procedure: TRANSESOPHAGEAL ECHOCARDIOGRAM (TEE);  Surgeon: Dixie Dials, MD;  Location: [facility];  Service: Cardiovascular;  Laterality: N/A;    Prior to Admission medications   Medication Sig Start Date End Date Taking? Authorizing Provider  acyclovir (ZOVIRAX) 200 MG capsule Take 1 capsule (200 mg total) by mouth 2 (two) times daily. 07/28/15   Cammie Sickle, MD  albuterol (PROVENTIL) (2.5 MG/3ML) 0.083% nebulizer solution Take 3 mLs (2.5 mg total) by nebulization every 6 (six) hours as needed for wheezing or shortness of breath. 01/06/15   Juline Patch, MD  atorvastatin (LIPITOR) 10 MG tablet Take 1 tablet (10 mg total) by mouth daily. 09/25/15   Minna Merritts, MD  calcium-vitamin D (CALCIUM 500+D) 500-200 MG-UNIT per tablet Take 1 tablet by mouth 2 (two) times daily.      Historical Provider, MD    Cholecalciferol (VITAMIN D3) 1000 UNITS tablet Take 1,000 Units by mouth daily.      Historical Provider, MD  citalopram (CELEXA) 40 MG tablet Take 1 tablet (40 mg total) by mouth daily. 11/09/15   Juline Patch, MD  cyanocobalamin (,VITAMIN B-12,) 1000 MCG/ML injection Inject 1 mL (1,000 mcg total) into the muscle every 14 (fourteen) days. Please dispense 10 ml vial to patient. 08/17/15   Cammie Sickle, MD  ferrous sulfate 325 (65 FE) MG tablet Take 325 mg by mouth daily with breakfast.      Historical Provider, MD  fish oil-omega-3 fatty acids 1000 MG capsule Take 1 g by mouth daily at 12 noon.     Historical Provider, MD  folic acid (FOLVITE) 1 MG tablet Take 4 tablets (4 mg total) by mouth daily. 10/14/14   Shanker Kristeen Mans, MD  furosemide (LASIX) 80 MG tablet Take 1  tablet (80 mg total) by mouth daily. Patient taking differently: Take 40 mg by mouth daily.  03/17/15   Minna Merritts, MD  levofloxacin (LEVAQUIN) 750 MG tablet Take 1 tablet (750 mg total) by mouth daily. 01/14/16   Juline Patch, MD  metroNIDAZOLE (FLAGYL) 500 MG tablet Take 1 tablet (500 mg total) by mouth 3 (three) times daily. 01/06/16   Juline Patch, MD  montelukast (SINGULAIR) 10 MG tablet Take 1 tablet (10 mg total) by mouth daily. 11/09/15   Juline Patch, MD  Multiple Vitamin (MULTIVITAMIN) capsule Take 1 capsule by mouth daily.      Historical Provider, MD  mycophenolate (CELLCEPT) 500 MG tablet Take 1 tablet (500 mg total) by mouth daily. 06/18/15   Kathrynn Ducking, MD  nystatin cream (MYCOSTATIN) APPLY TOPICALLY TWICE DAILY 01/12/15   Juline Patch, MD  potassium chloride SA (K-DUR,KLOR-CON) 20 MEQ tablet Take 1 tablet (20 mEq total) by mouth 2 (two) times daily. 03/17/15   Minna Merritts, MD  PROAIR HFA 108 (90 BASE) MCG/ACT inhaler Inhale 2 puffs into the lungs every 6 (six) hours as needed for shortness of breath.  08/21/14   Historical Provider, MD  Resource Beneprotein PACK Take 3 each by mouth 3 (three)  times daily. Take 3g three times a day    Historical Provider, MD  temazepam (RESTORIL) 15 MG capsule Take 1 capsule (15 mg total) by mouth at bedtime as needed for sleep (1-2 q hs). 11/09/15   Juline Patch, MD     Allergies Sulfonamide derivatives  Family History  Problem Relation Age of Onset  . Stroke Mother   . Hypertension Mother   . Emphysema Father   . Breast cancer Sister   . Colon cancer Brother   . Prostate cancer Brother   . Diabetes Grandchild   . Cancer Brother   . Cancer Sister   . Cancer Daughter   . Heart attack Neg Hx     Social History Social History  Substance Use Topics  . Smoking status: Former Smoker    Packs/day: 0.50    Years: 31.00    Types: Cigarettes    Quit date: 06/29/1968  . Smokeless tobacco: Former Systems developer    Types: Chew  . Alcohol use No     Comment: rarely    Review of Systems  Constitutional: Denies fevers Eyes: No visual changes.  ENT: No neck pain Cardiovascular: Denies chest pain. Respiratory: Denies as above Gastrointestinal: Mild nausea but no abdominal pain Genitourinary: Negative for dysuria. Musculoskeletal: As above Skin: Negative for rash. Neurological: Negative for headaches or weakness  10-point ROS otherwise negative.  ____________________________________________   PHYSICAL EXAM:  VITAL SIGNS: ED Triage Vitals  Enc Vitals Group     BP 2016-02-02 0802 (!) 73/44     Pulse Rate February 02, 2016 0802 73     Resp February 02, 2016 0802 (!) 33     Temp 02-Feb-2016 0802 97.9 F (36.6 C)     Temp src --      SpO2 02/02/2016 0802 95 %     Weight 02/02/2016 0805 130 lb (59 kg)     Height 2016/02/02 0805 5\' 5"  (1.651 m)     Head Circumference --      Peak Flow --      Pain Score 2016-02-02 0834 7     Pain Loc --      Pain Edu? --      Excl. in [place]? --  Constitutional: Alert and oriented. Polite, increased work of breathing and ill-appearing Eyes: Conjunctivae are normal.  Head: Atraumatic. Nose: No  congestion/rhinnorhea. Mouth/Throat: Mucous membranes are dry Neck:  Painless ROM, no vertebral tenderness to palpation Cardiovascular: Normal rate, regular rhythm.   Good peripheral circulation. Respiratory: Increased respiratory effort..  No retractions. Bibasilar rales. Gastrointestinal: Soft and nontender. No distention.  No CVA tenderness. Genitourinary: deferred Musculoskeletal: No lower extremity tenderness nor edema.  Warm and well perfused Neurologic:  Normal speech and language. No gross focal neurologic deficits are appreciated.  Skin:  Skin is warm, dry and intact. No rash noted. Psychiatric: Mood and affect are normal. Speech and behavior are normal.  ____________________________________________   LABS (all labs ordered are listed, but only abnormal results are displayed)  Labs Reviewed  CBC WITH DIFFERENTIAL/PLATELET - Abnormal; Notable for the following:       Result Value   WBC 13.7 (*)    RBC 2.73 (*)    Hemoglobin 9.8 (*)    HCT 31.0 (*)    MCV 113.4 (*)    MCH 36.0 (*)    MCHC 31.8 (*)    RDW 16.5 (*)    Platelets 89 (*)    All other components within normal limits  APTT - Abnormal; Notable for the following:    aPTT 80 (*)    All other components within normal limits  PROTIME-INR - Abnormal; Notable for the following:    Prothrombin Time 21.4 (*)    All other components within normal limits  CULTURE, BLOOD (ROUTINE X 2)  CULTURE, BLOOD (ROUTINE X 2)  COMPREHENSIVE METABOLIC PANEL  BRAIN NATRIURETIC PEPTIDE  TROPONIN I  LACTIC ACID, PLASMA  LACTIC ACID, PLASMA  BLOOD GAS, ARTERIAL   ____________________________________________  EKG  ED ECG REPORT I, Lavonia Drafts, the attending physician, personally viewed and interpreted this ECG.  Date: 02/24/16 EKG Time: 8:02 AM Rate: 73 Rhythm: normal sinus rhythm QRS Axis: normal Intervals: normal ST/T Wave abnormalities: normal Conduction Disturbances: Right bundle branch block Narrative  Interpretation: unremarkable  ____________________________________________  RADIOLOGY  Chest x-ray possible edema ____________________________________________   PROCEDURES  Procedure(s) performed: No    Critical Care performed: yes  CRITICAL CARE Performed by: Lavonia Drafts   Total critical care time:30 minutes  Critical care time was exclusive of separately billable procedures and treating other patients.  Critical care was necessary to treat or prevent imminent or life-threatening deterioration.  Critical care was time spent personally by me on the following activities: development of treatment plan with patient and/or surrogate as well as nursing, discussions with consultants, evaluation of patient's response to treatment, examination of patient, obtaining history from patient or surrogate, ordering and performing treatments and interventions, ordering and review of laboratory studies, ordering and review of radiographic studies, pulse oximetry and re-evaluation of patient's condition.  ____________________________________________   INITIAL IMPRESSION / ASSESSMENT AND PLAN / ED COURSE  Pertinent labs & imaging results that were available during my care of the patient were reviewed by me and considered in my medical decision making (see chart for details).  Patient with extensive medical history presents with shortness of breath, hypotension and back pain. He is afebrile however given his history of immunosuppression and CLL highly suspicious for sepsis. However he also has a history of pulmonary emboli and certainly given his back pain there is a concern for possible dissection. Labs are sent, patient is feeling somewhat better on nasal cannula oxygen, his EKG is unremarkable. Pending chest x-ray. I will start antibiotics  given hypotension and give 500 cc boluses given history of CHF.  Clinical Course  ----------------------------------------- 8:50 AM on  02-12-16 -----------------------------------------  Elevated white blood cell count noted, just notified of severely elevated lactic acid. Still unclear if sepsis or CHF with hypotension vs. Other etiology. May require pressors if no improvement with gentle hydration. X-ray is difficult to interpret but somewhat concerning for pulmonary edema, CT angio pending. Observing closely  ----------------------------------------- 8:54 AM on 12-Feb-2016 -----------------------------------------  Significant elevation in creatinine and decreased GFR from baseline and unfortunately this may prevent CT angiography. Will consult ICU. ____________________________________________ ----------------------------------------- 9:30 AM on February 12, 2016 -----------------------------------------  Discussed with Dr. Stevenson Clinch who recommends against CT, he will come down to see the patient in the emergency department and will admit. We will then decide whether to proceed with 30 MLS per kilogram given evidence of pulmonary edema on chest x-ray and likelihood of extremely poor outcomes.  ----------------------------------------- 9:42 AM on 12-Feb-2016 -----------------------------------------  While Dr. Stevenson Clinch was examining the patient, he coughed and then became very bradycardic. Patient is a DO NOT RESUSCITATE, we will give IV morphine and ordered drip for comfort care which the family approves of  FINAL CLINICAL IMPRESSION(S) / ED DIAGNOSES  Final diagnoses:  Acute respiratory failure with hypoxia ([place])  Septic shock ([place])      NEW MEDICATIONS STARTED DURING THIS VISIT:  New Prescriptions   No medications on file     Note:  This document was prepared using Dragon voice recognition software and may include unintentional dictation errors.    Lavonia Drafts, MD 02/12/16 [phone]

## 2016-02-10 NOTE — ED Notes (Addendum)
Pts family left.  Oldest sister left phone number to be reached: [phone]  Tyler Deis arrived to pick up pt.  IV's removed.

## 2016-02-10 NOTE — ED Notes (Signed)
resp in to obtain abg at this time.

## 2016-02-10 NOTE — ED Notes (Signed)
Respiratory in to place pt on BIPAP.

## 2016-02-10 NOTE — ED Notes (Addendum)
Phone call placed to [facility] anatomical donation, this RN spoke with [employer].

## 2016-02-10 NOTE — ED Notes (Addendum)
Family at bedside, pt heart rate decreasing as icu md at bedside. Pt placed on BIPAP prior and pt noted to have decreases perfusion at this time.  bp 42/31, heart rate in the 30's at this time. Family at bedside at this time. pts wishes are not to be resuscitated.  Pt will be placed on oxgen per [place] for comfort and given morphine as ordered. Pt with agonal respirations at this time.

## 2016-02-10 NOTE — ED Notes (Signed)
Spoke with rep Murlean Caller from [employer] and given service number [phone] and pt is ok to be released to funeral home.  Family still remains at bedside.

## 2016-02-10 NOTE — ED Provider Notes (Signed)
Patient pronounced dead by myself at 9:50 AM with family at bedside   Lavonia Drafts, MD 02-26-16 [phone]

## 2016-02-10 DEATH — deceased

## 2016-02-19 ENCOUNTER — Inpatient Hospital Stay: Payer: Medicare Other

## 2016-03-01 ENCOUNTER — Ambulatory Visit: Payer: Medicare Other | Admitting: Internal Medicine

## 2016-03-01 ENCOUNTER — Other Ambulatory Visit: Payer: Medicare Other

## 2016-03-25 ENCOUNTER — Other Ambulatory Visit: Payer: Self-pay | Admitting: Nurse Practitioner

## 2016-05-11 ENCOUNTER — Encounter: Payer: Medicare Other | Admitting: Family Medicine

## 2016-06-28 ENCOUNTER — Ambulatory Visit: Payer: Medicare Other | Admitting: Neurology

## 2017-05-01 IMAGING — CT CT ABDOMEN W/ CM
2 of 5 series · 4 of 46 positions shown, 6 images · IV contrast (Iodine)
Comparison: CT abdomen and pelvis 07/04/2008.

CLINICAL DATA: Current history of hemolytic anemia associated with
lymphoproliferative disorder. Prior splenectomy. CT requested to
evaluate for accessory splenic tissue.

EXAM:
CT ABDOMEN WITH CONTRAST
TECHNIQUE: Multidetector CT imaging of the abdomen was performed using the
standard protocol following bolus administration of intravenous
contrast.
CONTRAST:  80mL OMNIPAQUE IOHEXOL 300 MG/ML IV. Oral contrast was
also administered.

[Series 206: coronals · coronal · 0.45mm/px · 3 of 132 slices shown, 4 images]
[im 30/132  soft-tissue]
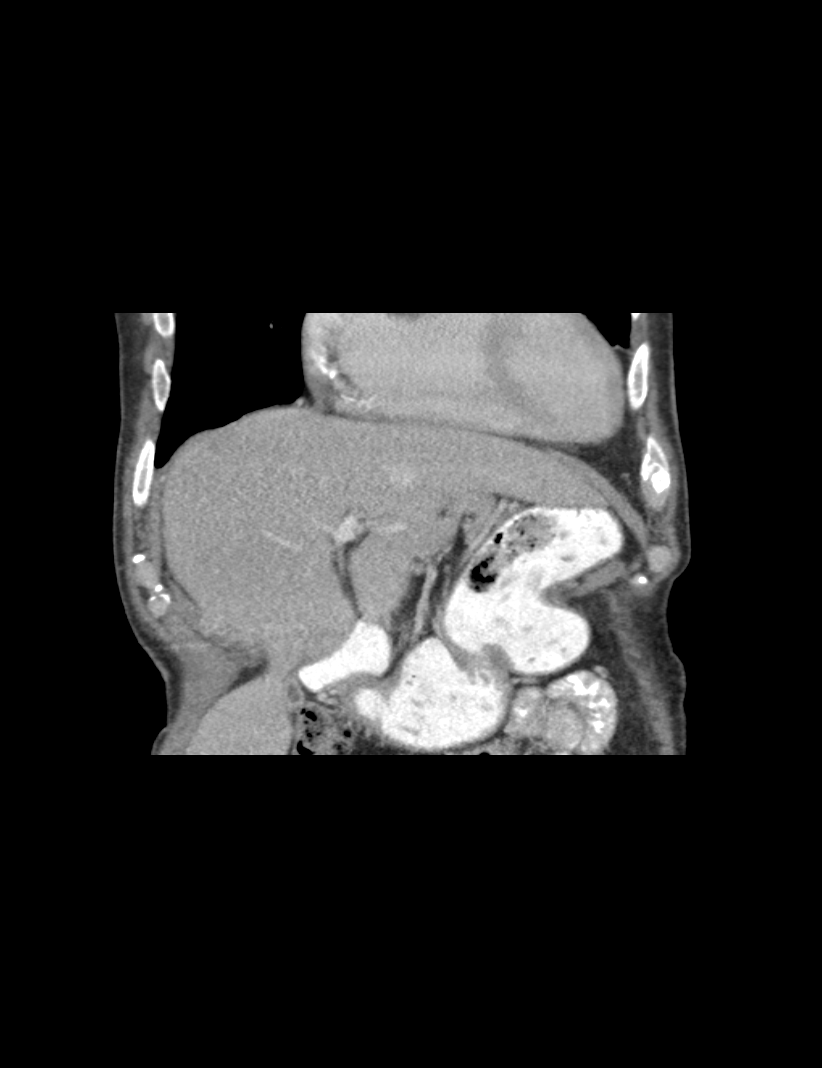
[im 30/132  bone]
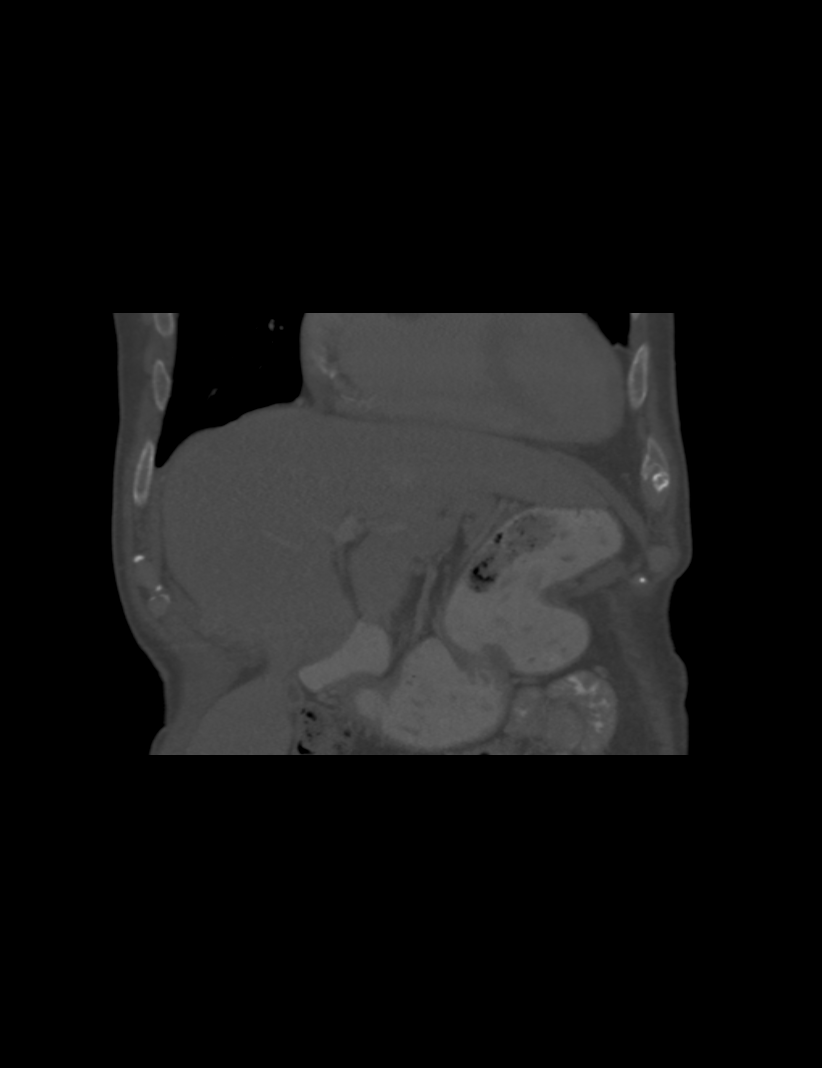
[im 73/132  soft-tissue]
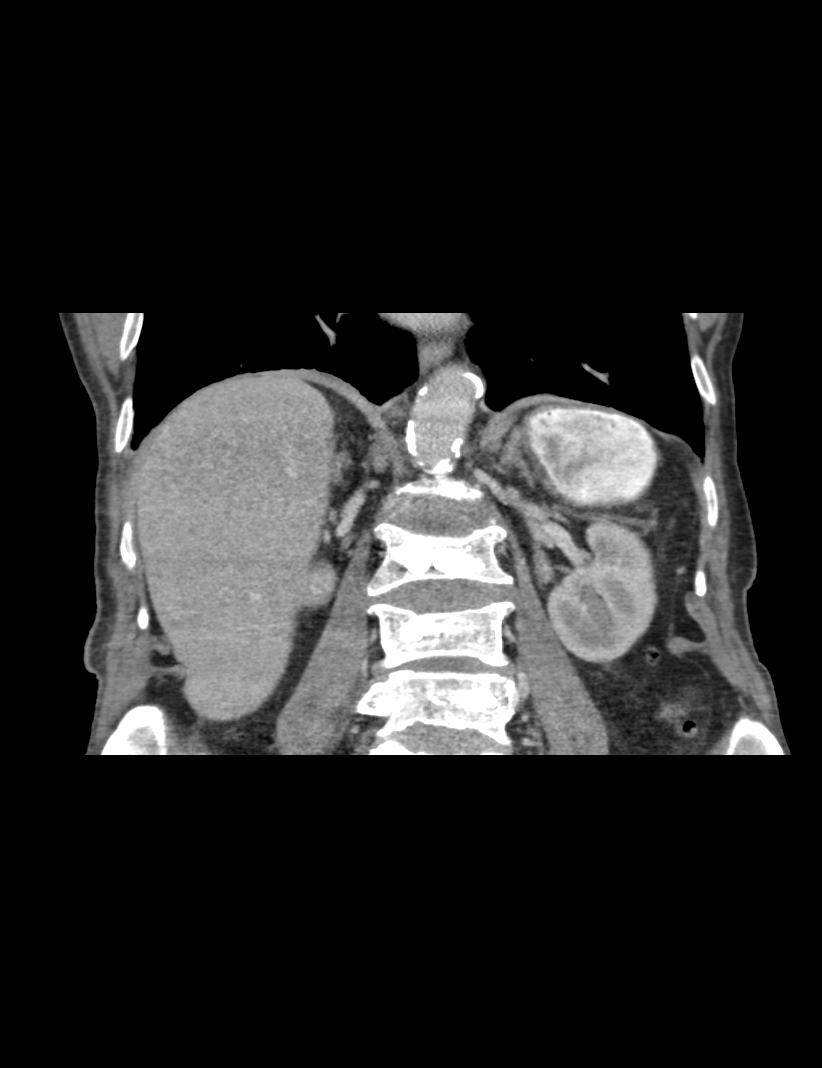
[im 102/132  soft-tissue]
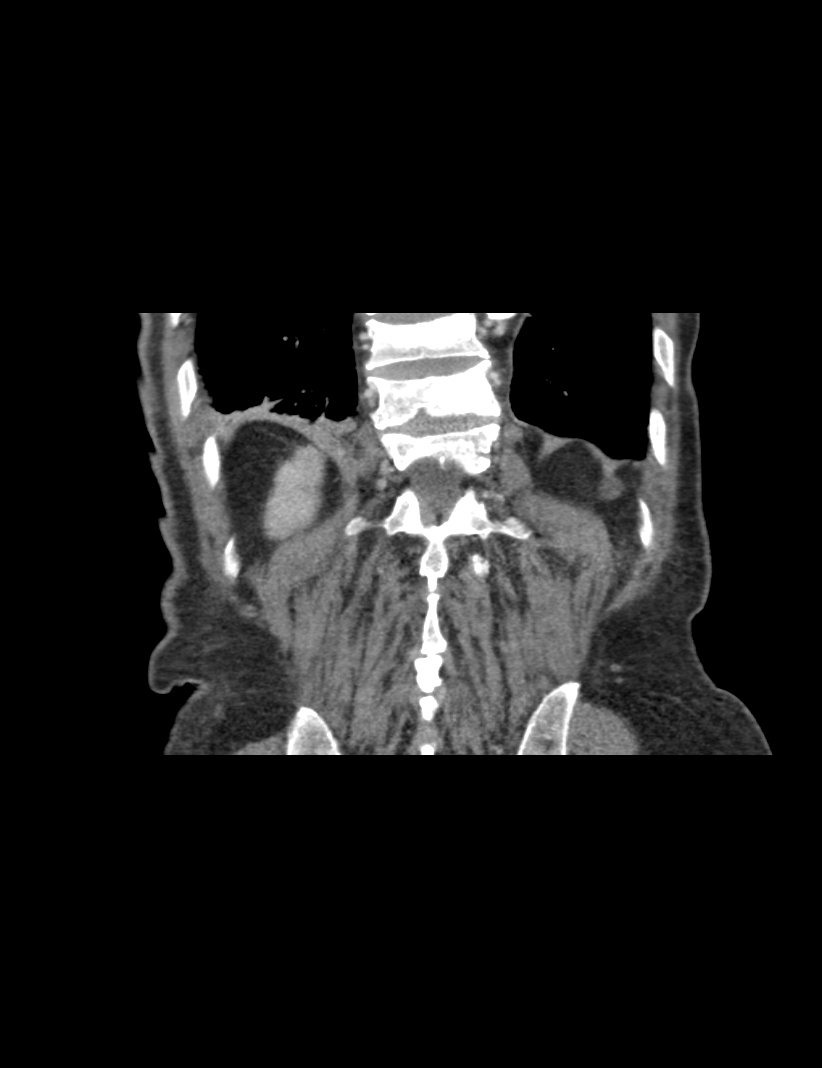

[Series 207: sag · sagittal · 0.45mm/px · 1 of 151 slices shown, 2 images]
[im 51/151  soft-tissue]
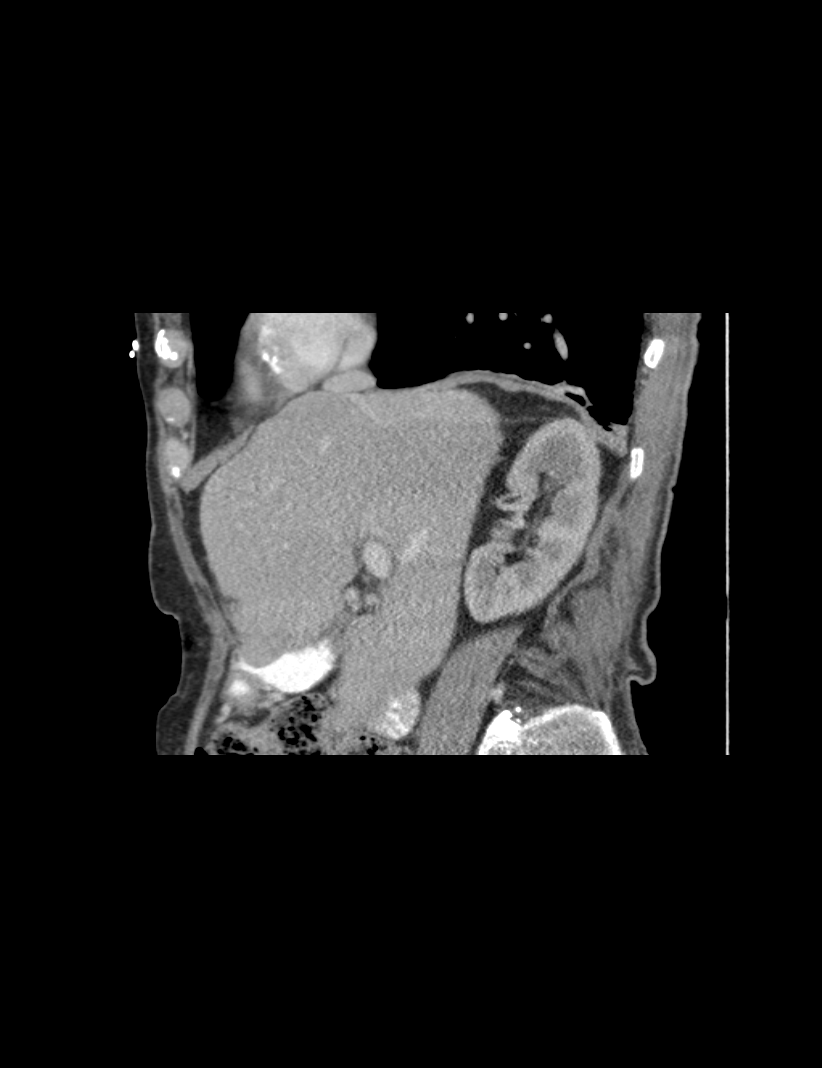
[im 51/151  bone]
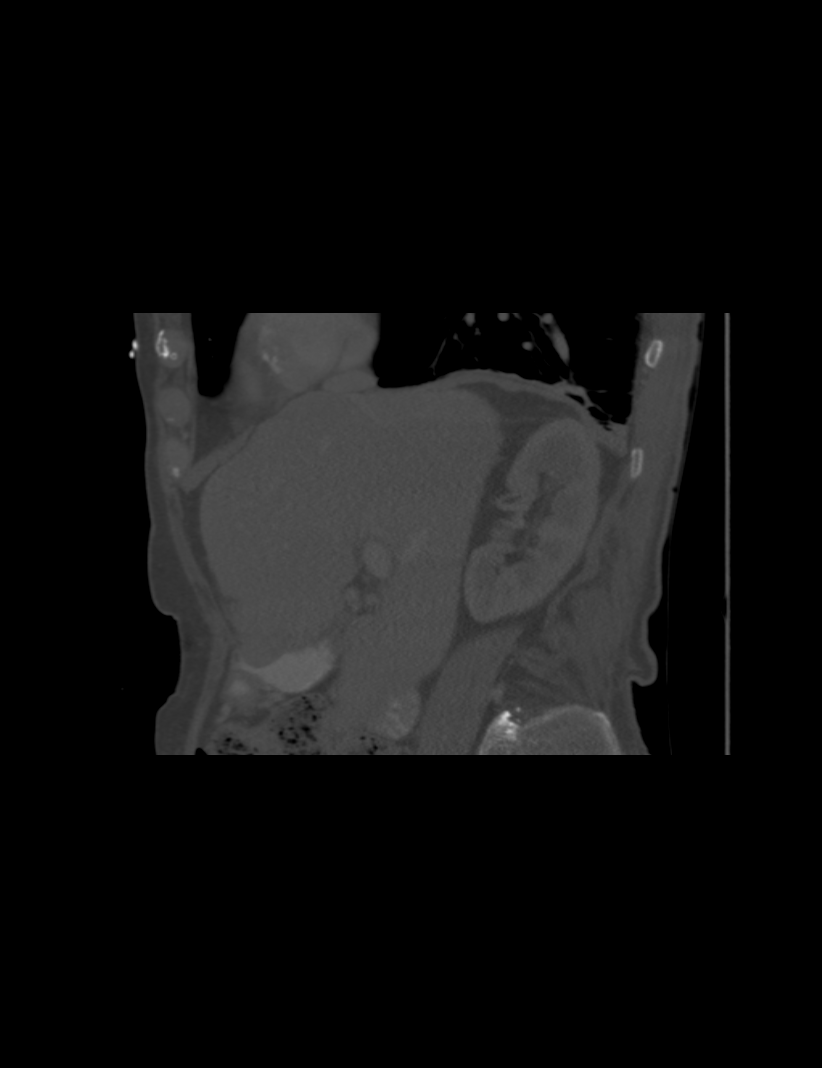

[4 of 46 positions shown; findings below may reference images not displayed]

FINDINGS: No evidence of accessory splenic tissue in the splenectomy bed or
elsewhere in the abdomen. Scattered calcified granulomata throughout
the liver which is moderately enlarged, likely mildly increased in
size since the 8373 examination. No significant focal hepatic
parenchymal abnormality. Normal pancreas, adrenal glands and right
kidney. Approximate 1.2 cm exophytic simple cyst arising from the
upper pole of the left kidney, unchanged; no significant abnormality
involving the left kidney. Severe aortoiliac atherosclerosis without
aneurysm. No significant lymphadenopathy.

Normal-appearing stomach filled with food. Visualized small bowel
normal in appearance. Large stool burden throughout the visualized
colon. Extensive diverticulosis involving the visualized descending
colon without evidence of acute diverticulitis. No ascites.

Bone window images demonstrate severe generalized osseous
demineralization with compression fractures of T11, T12, L1, L2, L3,
L4 and L5, all with prior augmentation. Visualized lung bases clear
apart from scarring deep in the lower lobes. Heart markedly
enlarged.
IMPRESSION: 1. No evidence of accessory splenic tissue in the splenectomy bed or
elsewhere in the abdomen.
2. Hepatomegaly, with likely mild increase in size since 8373. No
significant focal abnormality involving the liver.
3. Diverticulosis involving the visualized descending colon without
evidence of acute diverticulitis.

## 2017-05-01 IMAGING — CR DG CHEST 1V PORT
1 series · 1 of 1 positions shown · non-contrast
Comparison: Portable exam 9010 hours compared to 08/21/2014

CLINICAL DATA: Shortness of breath for several months worsened over
past couple weeks, former smoker, history CLL, diastolic
dysfunction, mitral regurgitation, aortic insufficiency

EXAM:
PORTABLE CHEST - 1 VIEW

[AP]
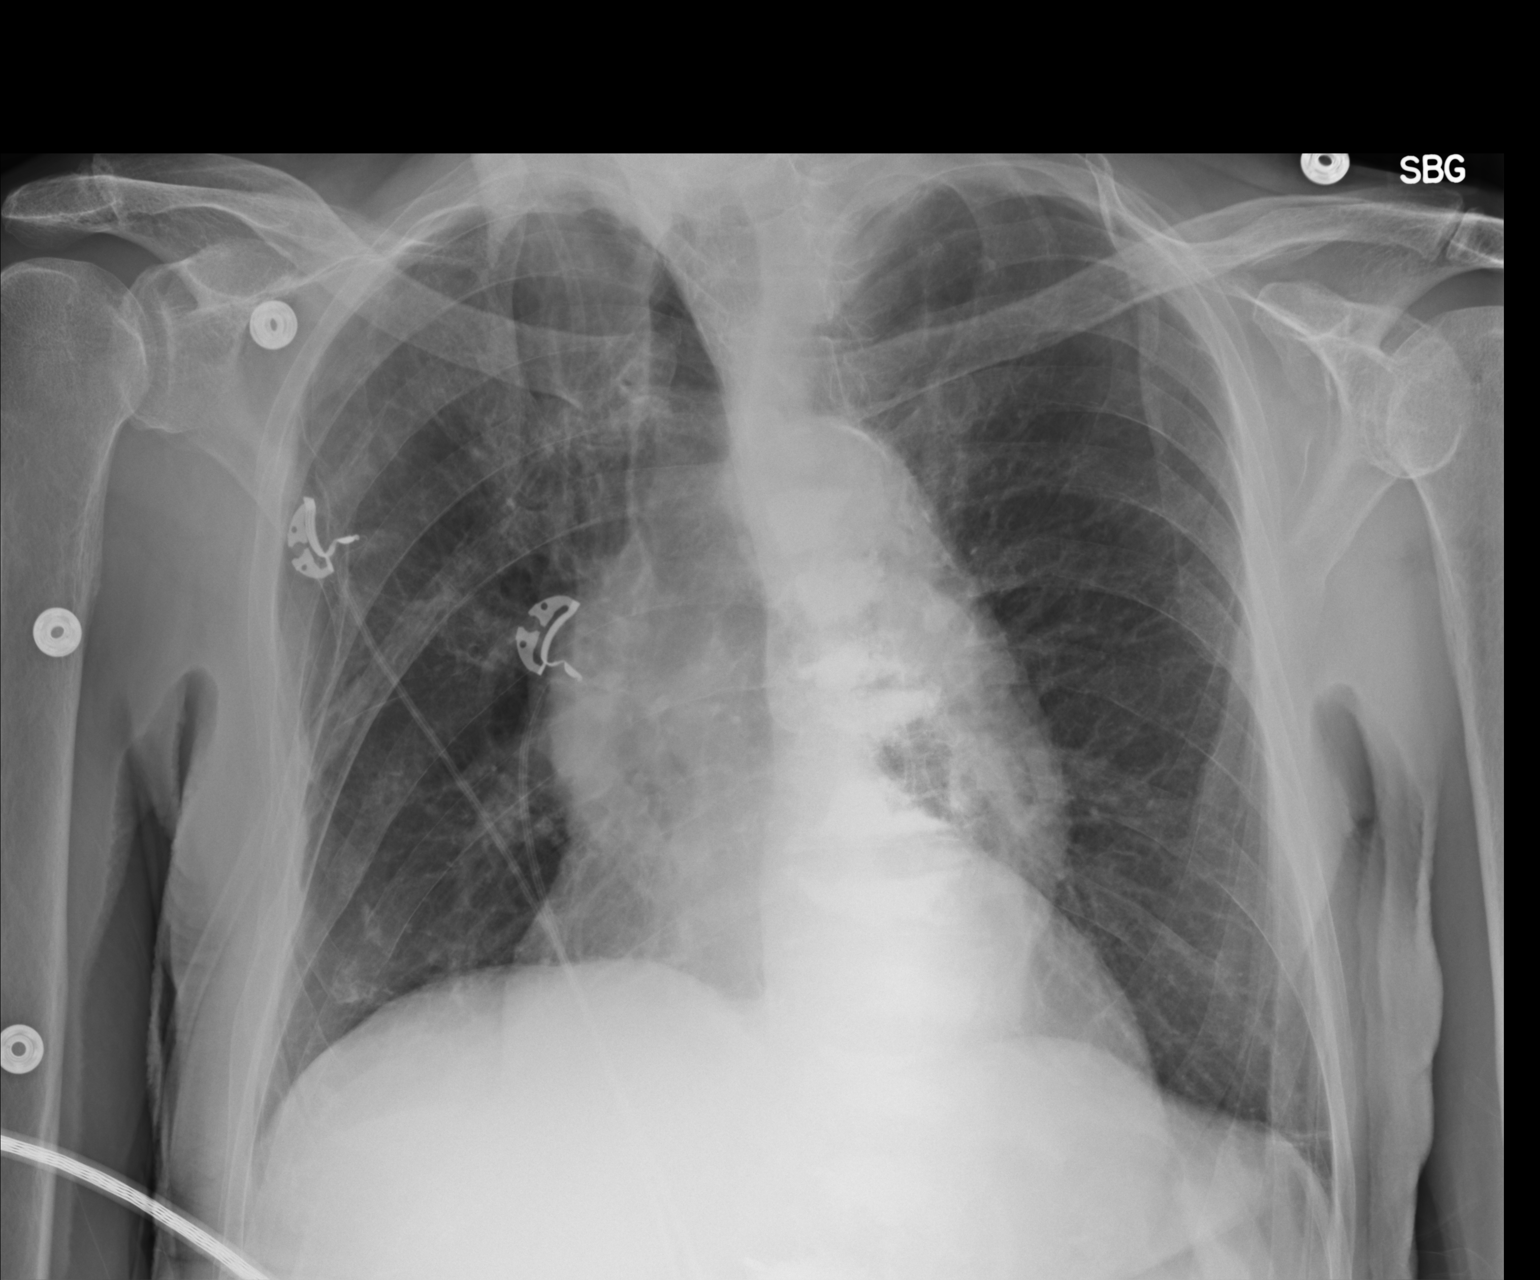

[1 of 1 positions shown; findings below may reference images not displayed]

FINDINGS: Enlargement of cardiac silhouette.

Calcified tortuous aorta.

Mediastinal contours and pulmonary vascularity normal.

Peripheral lucency is seen at the lateral mid RIGHT chest, similar
to an earlier exam of 07/16/2008, suspect related to the multiple
peripheral RIGHT blebs identified by prior CT chest, perhaps
slightly more prominent and conspicuity due to patient rotation to
the RIGHT.

No definite pneumothorax identified.

Biapical scarring with minimal subsegmental atelectasis at LEFT
base.

Lungs appear emphysematous but otherwise clear.

No pleural effusion or pneumothorax.

Bones demineralized with multiple prior vertebroplasties.
IMPRESSION: COPD changes with peripheral blebs in the RIGHT lung.

Minimal subsegmental atelectasis LEFT base.
